# Patient Record
Sex: Female | Born: 1945 | Race: White | Hispanic: No | Marital: Married | Smoking: Former smoker
Health system: Southern US, Community
[De-identification: ages and names within clinical notes are randomized; demographics above are authoritative.]

## PROBLEM LIST (undated history)

## (undated) DIAGNOSIS — E119 Type 2 diabetes mellitus without complications: Secondary | ICD-10-CM

## (undated) DIAGNOSIS — G4730 Sleep apnea, unspecified: Secondary | ICD-10-CM

## (undated) DIAGNOSIS — R609 Edema, unspecified: Secondary | ICD-10-CM

## (undated) DIAGNOSIS — C4491 Basal cell carcinoma of skin, unspecified: Secondary | ICD-10-CM

## (undated) DIAGNOSIS — T884XXA Failed or difficult intubation, initial encounter: Secondary | ICD-10-CM

## (undated) DIAGNOSIS — F848 Other pervasive developmental disorders: Secondary | ICD-10-CM

## (undated) DIAGNOSIS — K635 Polyp of colon: Secondary | ICD-10-CM

## (undated) DIAGNOSIS — J449 Chronic obstructive pulmonary disease, unspecified: Secondary | ICD-10-CM

## (undated) DIAGNOSIS — T8859XA Other complications of anesthesia, initial encounter: Secondary | ICD-10-CM

## (undated) DIAGNOSIS — R32 Unspecified urinary incontinence: Secondary | ICD-10-CM

## (undated) DIAGNOSIS — R7303 Prediabetes: Secondary | ICD-10-CM

## (undated) DIAGNOSIS — I10 Essential (primary) hypertension: Secondary | ICD-10-CM

## (undated) DIAGNOSIS — E669 Obesity, unspecified: Secondary | ICD-10-CM

## (undated) DIAGNOSIS — T4145XA Adverse effect of unspecified anesthetic, initial encounter: Secondary | ICD-10-CM

## (undated) DIAGNOSIS — M25642 Stiffness of left hand, not elsewhere classified: Secondary | ICD-10-CM

## (undated) DIAGNOSIS — M1990 Unspecified osteoarthritis, unspecified site: Secondary | ICD-10-CM

## (undated) DIAGNOSIS — E785 Hyperlipidemia, unspecified: Secondary | ICD-10-CM

## (undated) DIAGNOSIS — M1612 Unilateral primary osteoarthritis, left hip: Secondary | ICD-10-CM

## (undated) DIAGNOSIS — J45909 Unspecified asthma, uncomplicated: Secondary | ICD-10-CM

## (undated) HISTORY — PX: BACK SURGERY: SHX140

## (undated) HISTORY — PX: COLONOSCOPY: SHX174

## (undated) HISTORY — PX: EYE SURGERY: SHX253

## (undated) HISTORY — DX: Basal cell carcinoma of skin, unspecified: C44.91

## (undated) HISTORY — PX: ABDOMINAL HYSTERECTOMY: SHX81

## (undated) HISTORY — PX: FRACTURE SURGERY: SHX138

---

## 2001-08-12 HISTORY — PX: FRACTURE SURGERY: SHX138

## 2003-04-13 HISTORY — PX: ELBOW SURGERY: SHX618

## 2003-08-13 HISTORY — PX: THUMB AMPUTATION: SHX804

## 2004-07-03 ENCOUNTER — Ambulatory Visit: Payer: Self-pay | Admitting: Gastroenterology

## 2004-08-12 HISTORY — PX: HAND SURGERY: SHX662

## 2005-01-04 ENCOUNTER — Ambulatory Visit: Payer: Self-pay

## 2005-08-12 DIAGNOSIS — F848 Other pervasive developmental disorders: Secondary | ICD-10-CM

## 2005-08-12 HISTORY — DX: Other pervasive developmental disorders: F84.8

## 2006-01-31 ENCOUNTER — Ambulatory Visit: Payer: Self-pay | Admitting: Internal Medicine

## 2007-02-03 ENCOUNTER — Ambulatory Visit: Payer: Self-pay

## 2007-04-14 ENCOUNTER — Ambulatory Visit: Payer: Self-pay | Admitting: Gastroenterology

## 2007-08-13 HISTORY — DX: Hypercalcemia: E83.52

## 2008-02-05 ENCOUNTER — Ambulatory Visit: Payer: Self-pay

## 2009-02-06 ENCOUNTER — Ambulatory Visit: Payer: Self-pay | Admitting: Internal Medicine

## 2010-02-07 ENCOUNTER — Ambulatory Visit: Payer: Self-pay | Admitting: Internal Medicine

## 2011-02-15 ENCOUNTER — Ambulatory Visit: Payer: Self-pay

## 2012-02-17 ENCOUNTER — Ambulatory Visit: Payer: Self-pay

## 2013-04-09 ENCOUNTER — Ambulatory Visit: Payer: Self-pay | Admitting: Internal Medicine

## 2013-10-15 DIAGNOSIS — E559 Vitamin D deficiency, unspecified: Secondary | ICD-10-CM | POA: Insufficient documentation

## 2013-10-15 HISTORY — DX: Vitamin D deficiency, unspecified: E55.9

## 2014-04-13 DIAGNOSIS — G629 Polyneuropathy, unspecified: Secondary | ICD-10-CM

## 2014-04-13 HISTORY — DX: Polyneuropathy, unspecified: G62.9

## 2014-05-12 ENCOUNTER — Ambulatory Visit: Payer: Self-pay | Admitting: Internal Medicine

## 2015-01-04 ENCOUNTER — Other Ambulatory Visit: Payer: Self-pay

## 2015-01-04 ENCOUNTER — Emergency Department
Admission: EM | Admit: 2015-01-04 | Discharge: 2015-01-04 | Disposition: A | Discharge status: Home / Self Care | Payer: Medicare PPO | Attending: Emergency Medicine | Admitting: Emergency Medicine

## 2015-01-04 ENCOUNTER — Emergency Department: Payer: Medicare PPO

## 2015-01-04 DIAGNOSIS — Z79899 Other long term (current) drug therapy: Secondary | ICD-10-CM | POA: Insufficient documentation

## 2015-01-04 DIAGNOSIS — M549 Dorsalgia, unspecified: Secondary | ICD-10-CM | POA: Diagnosis not present

## 2015-01-04 DIAGNOSIS — G629 Polyneuropathy, unspecified: Secondary | ICD-10-CM | POA: Insufficient documentation

## 2015-01-04 DIAGNOSIS — M792 Neuralgia and neuritis, unspecified: Secondary | ICD-10-CM

## 2015-01-04 DIAGNOSIS — M25511 Pain in right shoulder: Secondary | ICD-10-CM | POA: Diagnosis present

## 2015-01-04 LAB — COMPREHENSIVE METABOLIC PANEL
ALT: 27 U/L (ref 14–54)
AST: 23 U/L (ref 15–41)
Albumin: 3.9 g/dL (ref 3.5–5.0)
Alkaline Phosphatase: 64 U/L (ref 38–126)
Anion gap: 7 (ref 5–15)
BILIRUBIN TOTAL: 0.5 mg/dL (ref 0.3–1.2)
BUN: 22 mg/dL — ABNORMAL HIGH (ref 6–20)
CHLORIDE: 106 mmol/L (ref 101–111)
CO2: 27 mmol/L (ref 22–32)
CREATININE: 0.77 mg/dL (ref 0.44–1.00)
Calcium: 9.8 mg/dL (ref 8.9–10.3)
GFR calc Af Amer: >60 mL/min (ref >60)
GFR calc non Af Amer: >60 mL/min (ref >60)
GLUCOSE: 152 mg/dL — AB (ref 65–99)
POTASSIUM: 4.3 mmol/L (ref 3.5–5.1)
SODIUM: 140 mmol/L (ref 135–145)
Total Protein: 7 g/dL (ref 6.5–8.1)

## 2015-01-04 LAB — CBC WITH DIFFERENTIAL/PLATELET
BASOS ABS: 0.1 10*3/uL (ref 0–0.1)
Basophils Relative: 1 %
Eosinophils Absolute: 0.3 10*3/uL (ref 0–0.7)
Eosinophils Relative: 3 %
HCT: 42.3 % (ref 35.0–47.0)
Hemoglobin: 14.1 g/dL (ref 12.0–16.0)
Lymphocytes Relative: 41 %
Lymphs Abs: 3.8 10*3/uL — ABNORMAL HIGH (ref 1.0–3.6)
MCH: 28.8 pg (ref 26.0–34.0)
MCHC: 33.3 g/dL (ref 32.0–36.0)
MCV: 86.6 fL (ref 80.0–100.0)
MONOS PCT: 5 %
Monocytes Absolute: 0.4 10*3/uL (ref 0.2–0.9)
Neutro Abs: 4.7 10*3/uL (ref 1.4–6.5)
Neutrophils Relative %: 50 %
Platelets: 250 10*3/uL (ref 150–440)
RBC: 4.88 MIL/uL (ref 3.80–5.20)
RDW: 14.3 % (ref 11.5–14.5)
WBC: 9.4 10*3/uL (ref 3.6–11.0)

## 2015-01-04 LAB — TROPONIN I: Troponin I: <0.03 ng/mL (ref <0.031)

## 2015-01-04 MED ORDER — MEPERIDINE HCL 25 MG/ML IJ SOLN
25.0000 mg | Freq: Once | INTRAMUSCULAR | Status: AC
  Administered 2015-01-04: 25 mg via INTRAMUSCULAR

## 2015-01-04 MED ORDER — PROMETHAZINE HCL 25 MG/ML IJ SOLN
25.0000 mg | Freq: Four times a day (QID) | INTRAMUSCULAR | Status: DC | PRN
  Administered 2015-01-04: 25 mg via INTRAMUSCULAR

## 2015-01-04 MED ORDER — GABAPENTIN 100 MG PO CAPS: 100.0000 mg | ORAL_CAPSULE | Freq: Three times a day (TID) | ORAL | Status: DC

## 2015-01-04 MED ORDER — OXYCODONE-ACETAMINOPHEN 5-325 MG PO TABS: 1.0000 | ORAL_TABLET | ORAL | Status: DC | PRN

## 2015-01-04 MED ORDER — CYCLOBENZAPRINE HCL 10 MG PO TABS: 10.0000 mg | ORAL_TABLET | Freq: Three times a day (TID) | ORAL | Status: DC | PRN

## 2015-01-04 MED ORDER — DIAZEPAM 5 MG PO TABS
ORAL_TABLET | ORAL | Status: AC
  Filled 2015-01-04: qty 1

## 2015-01-04 MED ORDER — DIAZEPAM 5 MG PO TABS
5.0000 mg | ORAL_TABLET | Freq: Once | ORAL | Status: AC
  Administered 2015-01-04: 5 mg via ORAL

## 2015-01-04 MED ORDER — MEPERIDINE HCL 25 MG/ML IJ SOLN
INTRAMUSCULAR | Status: AC
  Filled 2015-01-04: qty 1

## 2015-01-04 MED ORDER — PROMETHAZINE HCL 25 MG/ML IJ SOLN
INTRAMUSCULAR | Status: AC
  Administered 2015-01-04: 25 mg via INTRAMUSCULAR
  Filled 2015-01-04: qty 1

## 2015-01-04 NOTE — ED Notes (Signed)
Presents with right shoulder pain that radiates down into right hand with some numbness to right hand. No known injury. Started this am.

## 2015-01-04 NOTE — ED Provider Notes (Signed)
[facility] Emergency Department Provider Note  ____________________________________________  Time seen: Approximately 9:04 AM  I have reviewed the triage vital signs and the nursing notes.   HISTORY  Chief Complaint Shoulder Pain   HPI Laura Hale is a 69 y.o. female presents for evaluation of severe right shoulder and neck pain. States the pain started this morning sudden onset set upon wakening. Patient thought she was having a heart attack. Brought in by EMS EKG normal and troponin levels are normal. Patient was sent back to flex for evaluation. Not exacerbated or made better by anything tried by the patient.  No past medical history on file.  There are no active problems to display for this patient.   No past surgical history on file.  Current Outpatient Rx  Name  Route  Sig  Dispense  Refill  . cyclobenzaprine (FLEXERIL) 10 MG tablet   Oral   Take 1 tablet (10 mg total) by mouth every 8 (eight) hours as needed for muscle spasms.   30 tablet   1   . gabapentin (NEURONTIN) 100 MG capsule   Oral   Take 1 capsule (100 mg total) by mouth 3 (three) times daily.   30 capsule   0   . oxyCODONE-acetaminophen (ROXICET) 5-325 MG per tablet   Oral   Take 1-2 tablets by mouth every 4 (four) hours as needed for severe pain.   15 tablet   0     Allergies Review of patient's allergies indicates not on file.  No family history on file.  Social History History  Substance Use Topics  . Smoking status: Not on file  . Smokeless tobacco: Not on file  . Alcohol Use: Not on file    Review of Systems Constitutional: No fever/chills Eyes: No visual changes. ENT: No sore throat. Cardiovascular: Denies chest pain. Respiratory: Denies shortness of breath. Gastrointestinal: No abdominal pain.  No nausea, no vomiting.  No diarrhea.  No constipation. Genitourinary: Negative for dysuria. Musculoskeletal: Negative for back pain. Skin: Negative  for rash. Neurological: Negative for headaches, focal weakness or numbness.  10-point ROS otherwise negative.  ____________________________________________   PHYSICAL EXAM:  VITAL SIGNS: ED Triage Vitals  Enc Vitals Group     BP 01/04/15 0722 115/92 mmHg     Pulse Rate 01/04/15 0722 65     Resp 01/04/15 0722 20     Temp 01/04/15 0722 97.7 F (36.5 C)     Temp Source 01/04/15 0722 Oral     SpO2 01/04/15 0722 99 %     Weight 01/04/15 0722 232 lb (105.235 kg)     Height 01/04/15 0722 5\' 6"  (1.676 m)     Head Cir --      Peak Flow --      Pain Score 01/04/15 0718 7     Pain Loc --      Pain Edu? --      Excl. in [place]? --     Constitutional: Alert and oriented. Well appearing and in no acute distress. Eyes: Conjunctivae are normal. PERRL. EOMI. Head: Atraumatic. Nose: No congestion/rhinnorhea. Mouth/Throat: Mucous membranes are moist.  Oropharynx non-erythematous. Neck: No stridor. Mild cervical spine tenderness. Hematological/Lymphatic/Immunilogical: No cervical lymphadenopathy. Cardiovascular: Normal rate, regular rhythm. Grossly normal heart sounds.  Good peripheral circulation. Respiratory: Normal respiratory effort.  No retractions. Lungs CTAB. Gastrointestinal: Soft and nontender. No distention. No abdominal bruits. No CVA tenderness. Musculoskeletal: No lower extremity tenderness nor edema.  No joint effusions. Positive cervical right  scapular tenderness. Neurologic:  Normal speech and language. No gross focal neurologic deficits are appreciated. Speech is normal. No gait instability. Skin:  Skin is warm, dry and intact. No rash noted. Psychiatric: Mood and affect are normal. Speech and behavior are normal.  ____________________________________________   LABS (all labs ordered are listed, but only abnormal results are displayed)  Labs Reviewed  CBC WITH DIFFERENTIAL/PLATELET - Abnormal; Notable for the following:    Lymphs Abs 3.8 (*)    All other components  within normal limits  COMPREHENSIVE METABOLIC PANEL - Abnormal; Notable for the following:    Glucose, Bld 152 (*)    BUN 22 (*)    All other components within normal limits  TROPONIN I   ____________________________________________  EKG  EKG normal sinus rhythm with left anterior block. Reviewed by ER M.D. ____________________________________________  RADIOLOGY  IMPRESSION: 1. Loss of the normal cervical lordosis may be related to underlying muscle spasm. 2. Multilevel degenerative disc disease most notable at C5-C6. 3. Right-sided facet arthropathy at C2-C3 and C4-C5. 4. Mild anterolisthesis of C4 on C5 favored to be degenerative in etiology. 5. No evidence of fracture, malalignment or bony lesion. 6. Atherosclerotic vascular calcifications including calcifications in both carotid bifurcations. ____________________________________________   PROCEDURES  Procedure(s) performed: None  Critical Care performed: No  ____________________________________________   INITIAL IMPRESSION / ASSESSMENT AND PLAN / ED COURSE  Pertinent labs & imaging results that were available during my care of the patient were reviewed by me and considered in my medical decision making (see chart for details).  Discussed all clinical radiological findings with the patient. Patient verbalizes understanding we'll treat for neuropathic pain rule out possible shingles breakout next 24-48 hours. Patient understands to return to the ER for worsening symptomatology. Rx given for gabapentin, oxycodone, and cyclobenzaprine. ____________________________________________   FINAL CLINICAL IMPRESSION(S) / ED DIAGNOSES  Final diagnoses:  Neuropathic pain of shoulder, right      Arlyss Repress, PA-C 01/04/15 1115  Lisa Roca, MD 01/04/15 1525

## 2015-01-04 NOTE — ED Notes (Signed)
Pt brought in by EMS with c/o Right shoulder pain. Her arm is tingling. EMS reports that her VSS and her EKG was WNL. Pt denies any injury to arm. No Deformity seen at this time.

## 2015-01-04 NOTE — ED Notes (Signed)
Pt reports pain some relieved by meds. PA aware.

## 2015-01-04 NOTE — Discharge Instructions (Signed)

## 2015-01-04 NOTE — ED Notes (Signed)
CT completed

## 2015-01-10 ENCOUNTER — Other Ambulatory Visit: Payer: Self-pay | Admitting: Internal Medicine

## 2015-01-10 DIAGNOSIS — M5090 Cervical disc disorder, unspecified, unspecified cervical region: Secondary | ICD-10-CM

## 2015-01-18 ENCOUNTER — Ambulatory Visit
Admission: RE | Admit: 2015-01-18 | Discharge: 2015-01-18 | Disposition: A | Discharge status: Home / Self Care | Payer: Medicare PPO | Source: Ambulatory Visit | Attending: Internal Medicine | Admitting: Internal Medicine

## 2015-01-18 DIAGNOSIS — M542 Cervicalgia: Secondary | ICD-10-CM | POA: Diagnosis present

## 2015-01-18 DIAGNOSIS — M5090 Cervical disc disorder, unspecified, unspecified cervical region: Secondary | ICD-10-CM

## 2015-01-18 DIAGNOSIS — M47892 Other spondylosis, cervical region: Secondary | ICD-10-CM | POA: Insufficient documentation

## 2015-01-18 DIAGNOSIS — M5022 Other cervical disc displacement, mid-cervical region: Secondary | ICD-10-CM | POA: Insufficient documentation

## 2015-01-23 ENCOUNTER — Other Ambulatory Visit: Payer: Self-pay | Admitting: Neurosurgery

## 2015-01-27 ENCOUNTER — Encounter (HOSPITAL_COMMUNITY)
Admission: RE | Admit: 2015-01-27 | Discharge: 2015-01-27 | Disposition: A | Discharge status: Home / Self Care | Payer: Medicare PPO | Source: Ambulatory Visit | Attending: Neurosurgery | Admitting: Neurosurgery

## 2015-01-27 ENCOUNTER — Encounter (HOSPITAL_COMMUNITY): Payer: Self-pay

## 2015-01-27 DIAGNOSIS — Z01812 Encounter for preprocedural laboratory examination: Secondary | ICD-10-CM | POA: Diagnosis not present

## 2015-01-27 DIAGNOSIS — Z01818 Encounter for other preprocedural examination: Secondary | ICD-10-CM | POA: Diagnosis not present

## 2015-01-27 HISTORY — DX: Unspecified osteoarthritis, unspecified site: M19.90

## 2015-01-27 HISTORY — DX: Unspecified asthma, uncomplicated: J45.909

## 2015-01-27 LAB — CBC
HCT: 43.9 % (ref 36.0–46.0)
Hemoglobin: 14.4 g/dL (ref 12.0–15.0)
MCH: 28.7 pg (ref 26.0–34.0)
MCHC: 32.8 g/dL (ref 30.0–36.0)
MCV: 87.5 fL (ref 78.0–100.0)
PLATELETS: 244 10*3/uL (ref 150–400)
RBC: 5.02 MIL/uL (ref 3.87–5.11)
RDW: 13.7 % (ref 11.5–15.5)
WBC: 11 10*3/uL — ABNORMAL HIGH (ref 4.0–10.5)

## 2015-01-27 LAB — BASIC METABOLIC PANEL
ANION GAP: 7 (ref 5–15)
BUN: 13 mg/dL (ref 6–20)
CHLORIDE: 107 mmol/L (ref 101–111)
CO2: 27 mmol/L (ref 22–32)
CREATININE: 0.76 mg/dL (ref 0.44–1.00)
Calcium: 10.6 mg/dL — ABNORMAL HIGH (ref 8.9–10.3)
GFR calc Af Amer: >60 mL/min (ref >60)
GFR calc non Af Amer: >60 mL/min (ref >60)
GLUCOSE: 106 mg/dL — AB (ref 65–99)
Potassium: 4.4 mmol/L (ref 3.5–5.1)
Sodium: 141 mmol/L (ref 135–145)

## 2015-01-27 LAB — SURGICAL PCR SCREEN
MRSA, PCR: NEGATIVE
Staphylococcus aureus: NEGATIVE

## 2015-01-27 NOTE — Progress Notes (Signed)
   01/27/15 1431  OBSTRUCTIVE SLEEP APNEA  Have you ever been diagnosed with sleep apnea through a sleep study? No  Do you snore loudly (loud enough to be heard through closed doors)?  1  Do you often feel tired, fatigued, or sleepy during the daytime? 0  Has anyone observed you stop breathing during your sleep? 0  Do you have, or are you being treated for high blood pressure? 0  BMI more than 35 kg/m2? 1  Age over 67 years old? 1  Neck circumference greater than 40 cm/16 inches? 1  Gender: 0

## 2015-01-27 NOTE — Pre-Procedure Instructions (Signed)
Laura Hale  01/27/2015      [id number] [place], [name] [address] Phone: [phone] Fax: [phone]    Your procedure is scheduled on 01/31/2015   Report to [facility] Admitting at 5:30 A.M.   Call this number if you have problems the morning of surgery:   [phone]   Remember:  Do not eat food or drink liquids after midnight.   Take these medicines the morning of surgery with A SIP OF WATER GABAPENTIN, nasal spray ok if desired,  Zyrtec if you desire     Do not wear jewelry, make-up or nail polish.   Do not wear lotions, powders, or perfumes.     Do not shave 48 hours prior to surgery.    Do not bring valuables to the hospital.   [facility] is not responsible for any belongings or valuables.  Contacts, dentures or bridgework may not be worn into surgery.  Leave your suitcase in the car.  After surgery it may be brought to your room.  For patients admitted to the hospital, discharge time will be determined by your treatment team.  Patients discharged the day of surgery will not be allowed to drive home.   Name and phone number of your driver:  /w [name] Special instructions:  Special Instructions: [place] - Preparing for Surgery  Before surgery, you can play an important role.  Because skin is not sterile, your skin needs to be as free of germs as possible.  You can reduce the number of germs on you skin by washing with CHG (chlorahexidine gluconate) soap before surgery.  CHG is an antiseptic cleaner which kills germs and bonds with the skin to continue killing germs even after washing.  Please DO NOT use if you have an allergy to CHG or antibacterial soaps.  If your skin becomes reddened/irritated stop using the CHG and inform your nurse when you arrive at Short Stay.  Do not shave (including legs and underarms) for at least 48 hours prior to the first CHG shower.  You may shave your  face.  Please follow these instructions carefully:   1.  Shower with CHG Soap the night before surgery and the  morning of Surgery.  2.  If you choose to wash your hair, wash your hair first as usual with your  normal shampoo.  3.  After you shampoo, rinse your hair and body thoroughly to remove the  Shampoo.  4.  Use CHG as you would any other liquid soap.  You can apply chg directly to the skin and wash gently with scrungie or a clean washcloth.  5.  Apply the CHG Soap to your body ONLY FROM THE NECK DOWN.    Do not use on open wounds or open sores.  Avoid contact with your eyes, ears, mouth and genitals (private parts).  Wash genitals (private parts)   with your normal soap.  6.  Wash thoroughly, paying special attention to the area where your surgery will be performed.  7.  Thoroughly rinse your body with warm water from the neck down.  8.  DO NOT shower/wash with your normal soap after using and rinsing off   the CHG Soap.  9.  Pat yourself dry with a clean towel.            10.  Wear clean pajamas.  11.  Place clean sheets on your bed the night of your first shower and do not sleep with pets.  Day of Surgery  Do not apply any lotions/deodorants the morning of surgery.  Please wear clean clothes to the hospital/surgery center.  Please read over the following fact sheets that you were given. Pain Booklet, deep breathing exercises, infection information, treatment with MUPIROCIN if needed

## 2015-01-27 NOTE — Progress Notes (Signed)
Primary for Ms. Okey Dupre is with Dr. Caryn Section, she denies ever having any cardiac advanced testing. Seen in ED at [facility] late May 2016, but found to have degenerative cervical spine so was d/c'd with meds & further follow up.

## 2015-01-30 MED ORDER — CEFAZOLIN SODIUM-DEXTROSE 2-3 GM-% IV SOLR
2.0000 g | INTRAVENOUS | Status: AC
  Administered 2015-01-31: 2 g via INTRAVENOUS
  Filled 2015-01-30: qty 50

## 2015-01-30 MED ORDER — DEXAMETHASONE SODIUM PHOSPHATE 10 MG/ML IJ SOLN
10.0000 mg | INTRAMUSCULAR | Status: AC
  Administered 2015-01-31: 10 mg via INTRAVENOUS
  Filled 2015-01-30: qty 1

## 2015-01-31 ENCOUNTER — Inpatient Hospital Stay (HOSPITAL_COMMUNITY): Payer: Medicare PPO | Admitting: Anesthesiology

## 2015-01-31 ENCOUNTER — Encounter (HOSPITAL_COMMUNITY)
Admission: RE | Disposition: A | Discharge status: Home / Self Care | Payer: Self-pay | Source: Ambulatory Visit | Attending: Neurosurgery

## 2015-01-31 ENCOUNTER — Inpatient Hospital Stay (HOSPITAL_COMMUNITY)
Admission: RE | Admit: 2015-01-31 | Discharge: 2015-02-01 | DRG: 473 | Disposition: A | Discharge status: Home / Self Care | Payer: Medicare PPO | Source: Ambulatory Visit | Attending: Neurosurgery | Admitting: Neurosurgery

## 2015-01-31 ENCOUNTER — Inpatient Hospital Stay (HOSPITAL_COMMUNITY): Payer: Medicare PPO

## 2015-01-31 DIAGNOSIS — M5012 Cervical disc disorder with radiculopathy, mid-cervical region: Secondary | ICD-10-CM | POA: Diagnosis present

## 2015-01-31 DIAGNOSIS — M79601 Pain in right arm: Secondary | ICD-10-CM | POA: Diagnosis present

## 2015-01-31 DIAGNOSIS — N393 Stress incontinence (female) (male): Secondary | ICD-10-CM | POA: Diagnosis present

## 2015-01-31 DIAGNOSIS — Z7982 Long term (current) use of aspirin: Secondary | ICD-10-CM

## 2015-01-31 DIAGNOSIS — Z87891 Personal history of nicotine dependence: Secondary | ICD-10-CM | POA: Diagnosis not present

## 2015-01-31 DIAGNOSIS — Z419 Encounter for procedure for purposes other than remedying health state, unspecified: Secondary | ICD-10-CM

## 2015-01-31 DIAGNOSIS — Z79899 Other long term (current) drug therapy: Secondary | ICD-10-CM | POA: Diagnosis not present

## 2015-01-31 DIAGNOSIS — M5020 Other cervical disc displacement, unspecified cervical region: Secondary | ICD-10-CM | POA: Diagnosis present

## 2015-01-31 DIAGNOSIS — J45909 Unspecified asthma, uncomplicated: Secondary | ICD-10-CM | POA: Diagnosis present

## 2015-01-31 HISTORY — DX: Other cervical disc displacement, unspecified cervical region: M50.20

## 2015-01-31 HISTORY — PX: ANTERIOR CERVICAL DECOMP/DISCECTOMY FUSION: SHX1161

## 2015-01-31 SURGERY — ANTERIOR CERVICAL DECOMPRESSION/DISCECTOMY FUSION 1 LEVEL
Anesthesia: General

## 2015-01-31 MED ORDER — ZOLPIDEM TARTRATE 5 MG PO TABS: 5.0000 mg | ORAL_TABLET | Freq: Every evening | ORAL | Status: DC | PRN

## 2015-01-31 MED ORDER — CEFAZOLIN SODIUM-DEXTROSE 2-3 GM-% IV SOLR
2.0000 g | Freq: Three times a day (TID) | INTRAVENOUS | Status: AC
  Administered 2015-01-31 (×2): 2 g via INTRAVENOUS
  Filled 2015-01-31 (×2): qty 50

## 2015-01-31 MED ORDER — PHENYLEPHRINE 40 MCG/ML (10ML) SYRINGE FOR IV PUSH (FOR BLOOD PRESSURE SUPPORT)
PREFILLED_SYRINGE | INTRAVENOUS | Status: AC
  Filled 2015-01-31: qty 10

## 2015-01-31 MED ORDER — AZELASTINE HCL 0.1 % NA SOLN
1.0000 | Freq: Every day | NASAL | Status: DC | PRN
  Filled 2015-01-31: qty 30

## 2015-01-31 MED ORDER — HEMOSTATIC AGENTS (NO CHARGE) OPTIME
TOPICAL | Status: DC | PRN
  Administered 2015-01-31: 1 via TOPICAL

## 2015-01-31 MED ORDER — DEXAMETHASONE 4 MG PO TABS
4.0000 mg | ORAL_TABLET | Freq: Four times a day (QID) | ORAL | Status: AC
  Administered 2015-01-31: 4 mg via ORAL
  Filled 2015-01-31: qty 1

## 2015-01-31 MED ORDER — LACTATED RINGERS IV SOLN
INTRAVENOUS | Status: DC | PRN
  Administered 2015-01-31 (×2): via INTRAVENOUS

## 2015-01-31 MED ORDER — ROCURONIUM BROMIDE 50 MG/5ML IV SOLN
INTRAVENOUS | Status: AC
  Filled 2015-01-31: qty 2

## 2015-01-31 MED ORDER — ONDANSETRON HCL 4 MG/2ML IJ SOLN: 4.0000 mg | INTRAMUSCULAR | Status: DC | PRN

## 2015-01-31 MED ORDER — PHENOL 1.4 % MT LIQD
1.0000 | OROMUCOSAL | Status: DC | PRN
  Filled 2015-01-31: qty 177

## 2015-01-31 MED ORDER — PANTOPRAZOLE SODIUM 40 MG IV SOLR
40.0000 mg | Freq: Every day | INTRAVENOUS | Status: DC
  Filled 2015-01-31: qty 40

## 2015-01-31 MED ORDER — ACETAMINOPHEN 650 MG RE SUPP: 650.0000 mg | RECTAL | Status: DC | PRN

## 2015-01-31 MED ORDER — ONDANSETRON HCL 4 MG/2ML IJ SOLN
INTRAMUSCULAR | Status: DC | PRN
  Administered 2015-01-31: 4 mg via INTRAVENOUS

## 2015-01-31 MED ORDER — ACETAMINOPHEN 325 MG PO TABS: 650.0000 mg | ORAL_TABLET | ORAL | Status: DC | PRN

## 2015-01-31 MED ORDER — 0.9 % SODIUM CHLORIDE (POUR BTL) OPTIME
TOPICAL | Status: DC | PRN
  Administered 2015-01-31: 1000 mL

## 2015-01-31 MED ORDER — CYCLOBENZAPRINE HCL 10 MG PO TABS: 10.0000 mg | ORAL_TABLET | Freq: Three times a day (TID) | ORAL | Status: DC | PRN

## 2015-01-31 MED ORDER — SODIUM CHLORIDE 0.9 % IJ SOLN
3.0000 mL | Freq: Two times a day (BID) | INTRAMUSCULAR | Status: DC
Start: 2015-01-31 — End: 2015-02-01
  Administered 2015-01-31 (×2): 3 mL via INTRAVENOUS

## 2015-01-31 MED ORDER — KCL IN DEXTROSE-NACL 20-5-0.45 MEQ/L-%-% IV SOLN
80.0000 mL/h | INTRAVENOUS | Status: DC
  Filled 2015-01-31 (×3): qty 1000

## 2015-01-31 MED ORDER — HYDROMORPHONE HCL 1 MG/ML IJ SOLN
0.2500 mg | INTRAMUSCULAR | Status: DC | PRN
  Administered 2015-01-31 (×2): 0.25 mg via INTRAVENOUS

## 2015-01-31 MED ORDER — GLYCOPYRROLATE 0.2 MG/ML IJ SOLN
INTRAMUSCULAR | Status: AC
  Filled 2015-01-31: qty 2

## 2015-01-31 MED ORDER — GLYCOPYRROLATE 0.2 MG/ML IJ SOLN
INTRAMUSCULAR | Status: DC | PRN
  Administered 2015-01-31: .4 mg via INTRAVENOUS

## 2015-01-31 MED ORDER — BISACODYL 5 MG PO TBEC
5.0000 mg | DELAYED_RELEASE_TABLET | Freq: Every day | ORAL | Status: DC | PRN
  Filled 2015-01-31: qty 1

## 2015-01-31 MED ORDER — PANTOPRAZOLE SODIUM 40 MG PO TBEC
40.0000 mg | DELAYED_RELEASE_TABLET | Freq: Every day | ORAL | Status: DC
  Administered 2015-01-31: 40 mg via ORAL

## 2015-01-31 MED ORDER — PROPOFOL 10 MG/ML IV BOLUS
INTRAVENOUS | Status: DC | PRN
  Administered 2015-01-31: 170 mg via INTRAVENOUS

## 2015-01-31 MED ORDER — DEXAMETHASONE SODIUM PHOSPHATE 4 MG/ML IJ SOLN
4.0000 mg | Freq: Four times a day (QID) | INTRAMUSCULAR | Status: AC
  Administered 2015-01-31: 4 mg via INTRAVENOUS
  Filled 2015-01-31: qty 1

## 2015-01-31 MED ORDER — LIDOCAINE HCL (CARDIAC) 20 MG/ML IV SOLN
INTRAVENOUS | Status: DC | PRN
  Administered 2015-01-31: 60 mg via INTRAVENOUS

## 2015-01-31 MED ORDER — MENTHOL 3 MG MT LOZG
1.0000 | LOZENGE | OROMUCOSAL | Status: DC | PRN
Start: 2015-01-31 — End: 2015-02-01
  Filled 2015-01-31: qty 9

## 2015-01-31 MED ORDER — LIDOCAINE HCL (CARDIAC) 20 MG/ML IV SOLN
INTRAVENOUS | Status: AC
  Filled 2015-01-31: qty 5

## 2015-01-31 MED ORDER — OXYCODONE-ACETAMINOPHEN 5-325 MG PO TABS
1.0000 | ORAL_TABLET | ORAL | Status: DC | PRN
  Administered 2015-01-31: 2 via ORAL
  Filled 2015-01-31: qty 2

## 2015-01-31 MED ORDER — PROPOFOL 10 MG/ML IV BOLUS
INTRAVENOUS | Status: AC
  Filled 2015-01-31: qty 20

## 2015-01-31 MED ORDER — FENTANYL CITRATE (PF) 250 MCG/5ML IJ SOLN
INTRAMUSCULAR | Status: AC
  Filled 2015-01-31: qty 5

## 2015-01-31 MED ORDER — MIDAZOLAM HCL 5 MG/5ML IJ SOLN
INTRAMUSCULAR | Status: DC | PRN
  Administered 2015-01-31: 2 mg via INTRAVENOUS

## 2015-01-31 MED ORDER — ROCURONIUM BROMIDE 100 MG/10ML IV SOLN
INTRAVENOUS | Status: DC | PRN
  Administered 2015-01-31: 50 mg via INTRAVENOUS

## 2015-01-31 MED ORDER — DOCUSATE SODIUM 100 MG PO CAPS
100.0000 mg | ORAL_CAPSULE | Freq: Two times a day (BID) | ORAL | Status: DC
  Administered 2015-01-31: 100 mg via ORAL
  Filled 2015-01-31: qty 1

## 2015-01-31 MED ORDER — HYDROMORPHONE HCL 1 MG/ML IJ SOLN
INTRAMUSCULAR | Status: AC
  Filled 2015-01-31: qty 1

## 2015-01-31 MED ORDER — NEOSTIGMINE METHYLSULFATE 10 MG/10ML IV SOLN
INTRAVENOUS | Status: DC | PRN
  Administered 2015-01-31: 3 mg via INTRAVENOUS

## 2015-01-31 MED ORDER — MIDAZOLAM HCL 2 MG/2ML IJ SOLN
INTRAMUSCULAR | Status: AC
  Filled 2015-01-31: qty 2

## 2015-01-31 MED ORDER — ONDANSETRON HCL 4 MG/2ML IJ SOLN
INTRAMUSCULAR | Status: AC
  Filled 2015-01-31: qty 2

## 2015-01-31 MED ORDER — SODIUM CHLORIDE 0.9 % IR SOLN
Status: DC | PRN
  Administered 2015-01-31: 08:00:00

## 2015-01-31 MED ORDER — NEOSTIGMINE METHYLSULFATE 10 MG/10ML IV SOLN
INTRAVENOUS | Status: AC
  Filled 2015-01-31: qty 2

## 2015-01-31 MED ORDER — PROMETHAZINE HCL 25 MG/ML IJ SOLN
6.2500 mg | INTRAMUSCULAR | Status: DC | PRN
Start: 2015-01-31 — End: 2015-01-31

## 2015-01-31 MED ORDER — FENTANYL CITRATE (PF) 250 MCG/5ML IJ SOLN
INTRAMUSCULAR | Status: DC | PRN
Start: 2015-01-31 — End: 2015-01-31
  Administered 2015-01-31: 150 ug via INTRAVENOUS
  Administered 2015-01-31: 100 ug via INTRAVENOUS

## 2015-01-31 MED ORDER — GABAPENTIN 100 MG PO CAPS
100.0000 mg | ORAL_CAPSULE | Freq: Three times a day (TID) | ORAL | Status: DC
  Administered 2015-01-31 (×2): 100 mg via ORAL
  Filled 2015-01-31 (×6): qty 1

## 2015-01-31 MED ORDER — ARTIFICIAL TEARS OP OINT
TOPICAL_OINTMENT | OPHTHALMIC | Status: AC
  Filled 2015-01-31: qty 3.5

## 2015-01-31 MED ORDER — BUPIVACAINE HCL (PF) 0.5 % IJ SOLN
INTRAMUSCULAR | Status: DC | PRN
  Administered 2015-01-31: 10 mL

## 2015-01-31 MED ORDER — SODIUM CHLORIDE 0.9 % IJ SOLN: 3.0000 mL | INTRAMUSCULAR | Status: DC | PRN

## 2015-01-31 MED ORDER — HYDROMORPHONE HCL 1 MG/ML IJ SOLN: 1.0000 mg | INTRAMUSCULAR | Status: DC | PRN

## 2015-01-31 MED ORDER — THROMBIN 5000 UNITS EX SOLR
CUTANEOUS | Status: DC | PRN
  Administered 2015-01-31 (×2): 5000 [IU] via TOPICAL

## 2015-01-31 SURGICAL SUPPLY — 58 items
BAG DECANTER FOR FLEXI CONT (MISCELLANEOUS) ×2 IMPLANT
BENZOIN TINCTURE PRP APPL 2/3 (GAUZE/BANDAGES/DRESSINGS) ×2 IMPLANT
BIT DRILL TRINICA 2.3MM (BIT) ×1 IMPLANT
BRUSH SCRUB EZ PLAIN DRY (MISCELLANEOUS) ×4 IMPLANT
BUR MATCHSTICK NEURO 3.0 LAGG (BURR) ×2 IMPLANT
CANISTER SUCT 3000ML PPV (MISCELLANEOUS) ×2 IMPLANT
CONT SPEC 4OZ CLIKSEAL STRL BL (MISCELLANEOUS) ×2 IMPLANT
DRAPE C-ARM 42X72 X-RAY (DRAPES) ×4 IMPLANT
DRAPE LAPAROTOMY 100X72 PEDS (DRAPES) ×2 IMPLANT
DRAPE MICROSCOPE LEICA (MISCELLANEOUS) ×2 IMPLANT
DRAPE POUCH INSTRU U-SHP 10X18 (DRAPES) ×2 IMPLANT
DRAPE SURG 17X23 STRL (DRAPES) ×4 IMPLANT
DRILL BIT TRINICA 2.3MM (BIT) ×2
DRSG OPSITE POSTOP 3X4 (GAUZE/BANDAGES/DRESSINGS) ×2 IMPLANT
DRSG OPSITE POSTOP 4X6 (GAUZE/BANDAGES/DRESSINGS) ×2 IMPLANT
DRSG TELFA 3X8 NADH (GAUZE/BANDAGES/DRESSINGS) ×2 IMPLANT
DURAPREP 6ML APPLICATOR 50/CS (WOUND CARE) ×2 IMPLANT
ELECT COATED BLADE 2.86 ST (ELECTRODE) ×2 IMPLANT
ELECT REM PT RETURN 9FT ADLT (ELECTROSURGICAL) ×2
ELECTRODE REM PT RTRN 9FT ADLT (ELECTROSURGICAL) ×1 IMPLANT
GAUZE SPONGE 4X4 12PLY STRL (GAUZE/BANDAGES/DRESSINGS) ×2 IMPLANT
GAUZE SPONGE 4X4 16PLY XRAY LF (GAUZE/BANDAGES/DRESSINGS) IMPLANT
GLOVE BIOGEL PI IND STRL 8.5 (GLOVE) ×4 IMPLANT
GLOVE BIOGEL PI INDICATOR 8.5 (GLOVE) ×4
GLOVE ECLIPSE 8.0 STRL XLNG CF (GLOVE) ×2 IMPLANT
GLOVE ECLIPSE 8.5 STRL (GLOVE) ×4 IMPLANT
GLOVE EXAM NITRILE LRG STRL (GLOVE) IMPLANT
GLOVE EXAM NITRILE XL STR (GLOVE) IMPLANT
GLOVE EXAM NITRILE XS STR PU (GLOVE) IMPLANT
GOWN STRL REUS W/ TWL LRG LVL3 (GOWN DISPOSABLE) IMPLANT
GOWN STRL REUS W/ TWL XL LVL3 (GOWN DISPOSABLE) ×2 IMPLANT
GOWN STRL REUS W/TWL 2XL LVL3 (GOWN DISPOSABLE) ×2 IMPLANT
GOWN STRL REUS W/TWL LRG LVL3 (GOWN DISPOSABLE)
GOWN STRL REUS W/TWL XL LVL3 (GOWN DISPOSABLE) ×2
HALTER HD/CHIN CERV TRACTION D (MISCELLANEOUS) ×2 IMPLANT
INTERBODY TM 11X14X7-7DEG ANG (Metal Cage) ×2 IMPLANT
KIT BASIN OR (CUSTOM PROCEDURE TRAY) ×2 IMPLANT
KIT ROOM TURNOVER OR (KITS) ×2 IMPLANT
NEEDLE SPNL 20GX3.5 QUINCKE YW (NEEDLE) ×2 IMPLANT
NS IRRIG 1000ML POUR BTL (IV SOLUTION) ×2 IMPLANT
PACK LAMINECTOMY NEURO (CUSTOM PROCEDURE TRAY) ×2 IMPLANT
PAD ARMBOARD 7.5X6 YLW CONV (MISCELLANEOUS) ×2 IMPLANT
PATTIES SURGICAL .25X.25 (GAUZE/BANDAGES/DRESSINGS) IMPLANT
PATTIES SURGICAL .75X.75 (GAUZE/BANDAGES/DRESSINGS) ×2 IMPLANT
PLATE 22MM (Plate) ×2 IMPLANT
PUTTY BONE GRAFT KIT 2.5ML (Bone Implant) ×2 IMPLANT
RUBBERBAND STERILE (MISCELLANEOUS) ×4 IMPLANT
SCREW SD FIXED 12MM (Screw) ×8 IMPLANT
SPONGE INTESTINAL PEANUT (DISPOSABLE) ×2 IMPLANT
SPONGE SURGIFOAM ABS GEL SZ50 (HEMOSTASIS) ×2 IMPLANT
STRIP CLOSURE SKIN 1/2X4 (GAUZE/BANDAGES/DRESSINGS) ×2 IMPLANT
SUT PDS AB 5-0 P3 18 (SUTURE) ×2 IMPLANT
SUT VIC AB 3-0 CP2 18 (SUTURE) ×2 IMPLANT
SYR 20ML ECCENTRIC (SYRINGE) IMPLANT
TOWEL OR 17X24 6PK STRL BLUE (TOWEL DISPOSABLE) ×2 IMPLANT
TOWEL OR 17X26 10 PK STRL BLUE (TOWEL DISPOSABLE) ×2 IMPLANT
TRAP SPECIMEN MUCOUS 40CC (MISCELLANEOUS) IMPLANT
WATER STERILE IRR 1000ML POUR (IV SOLUTION) ×2 IMPLANT

## 2015-01-31 NOTE — H&P (Signed)
Laura Hale is an 69 y.o. female.   Chief Complaint: Right arm pain HPI: The patient is a 69 year old female who is here for evaluation right arm pain of 6-8 weeks duration. She woke up with intrascapular pain and after 3-4 weeks 1 towards her right chest and arm. Shortness emergency room where she had a workup for cardiac issues that was negative. She is given some sterilize without relief. An MRI scan was done and she was referred for evaluation. When seen in the office the patient complained of pain going down her arm which goes and the thumb index and middle fingers of right hand. She has a great deal of pain by the elbow. She's tried gabapentin and Flexeril all without relief. Her scan was reviewed which showed a very large disc herniation at C6-7 on the right which fit well with her clinical presentation. After discussing the options the patient requested surgery and now comes for an anterior cervical discectomy with fusion and plating. I had a long discussion with her regarding the risks and benefits of surgical intervention. The risks discussed include but are not limited to bleeding infection weakness numbness paralysis spinal fluid leak coma quadriplegia hoarseness and death. We have discussed alternative methods of therapy along with the risks and benefits of nonintervention. She's had the opportunity to ask numerous questions and appears to understand. With this information in hand she has requested we proceed with surgery.  Past Medical History  Diagnosis Date  . Asthma     allergy induced asthma   . Arthritis     elbow, hands, neck     Past Surgical History  Procedure Laterality Date  . Elbow surgery Left 04/2003    x2  & for repair & then remove all hardware, due to injury from a fall  . Hand surgery Right 2006    injury- then eventually lost remainder of thumb  . Abdominal hysterectomy      No family history on file. Social History:  reports that she has quit smoking. She  quit smokeless tobacco use about 30 years ago. She reports that she drinks alcohol. She reports that she does not use illicit drugs.  Allergies: No Known Allergies  Medications Prior to Admission  Medication Sig Dispense Refill  . aspirin 81 MG tablet Take 81 mg by mouth daily.    Marland Kitchen atorvastatin (LIPITOR) 80 MG tablet Take 40 mg by mouth every morning.     Marland Kitchen azelastine (ASTELIN) 0.1 % nasal spray Place 1 spray into both nostrils daily as needed for allergies.     . cetirizine (ZYRTEC) 10 MG tablet Take 10 mg by mouth daily.    . Cholecalciferol (VITAMIN D3) 1000 UNITS CAPS Take 1,000 Units by mouth daily.    Marland Kitchen gabapentin (NEURONTIN) 100 MG capsule Take 1 capsule (100 mg total) by mouth 3 (three) times daily. (Patient taking differently: Take 100 mg by mouth 3 (three) times daily. 100mg . Morning & again midday & 200mg . At bedtime) 30 capsule 0  . naproxen sodium (ANAPROX) 220 MG tablet Take 220 mg by mouth 2 (two) times daily with a meal.    . oxyCODONE-acetaminophen (ROXICET) 5-325 MG per tablet Take 1-2 tablets by mouth every 4 (four) hours as needed for severe pain. 15 tablet 0  . cyclobenzaprine (FLEXERIL) 10 MG tablet Take 1 tablet (10 mg total) by mouth every 8 (eight) hours as needed for muscle spasms. (Patient not taking: Reported on 01/26/2015) 30 tablet 1    No  results found for this or any previous visit (from the past 1 hour(s)). No results found.  Positive for stress incontinence and asthma  Blood pressure 136/74, pulse 92, temperature 97.8 F (36.6 C), temperature source Oral, resp. rate 20, SpO2 97 %.  The patient is awake or and oriented. She is no facial asymmetry. Her gait is nonantalgic. Reflexes are decreased the right tricep and she has marked decreased strength of the right triceps muscle. Sensation is intact Assessment/Plan Impression is that of a C7 radiculopathy do a herniated disc at C6-7. The plan is for a C7 anterior cervical discectomy with fusion and  plating.  Faythe Ghee, MD 01/31/2015, 7:28 AM

## 2015-01-31 NOTE — Anesthesia Preprocedure Evaluation (Addendum)
Anesthesia Evaluation  Patient identified by MRN, date of birth, ID band Patient awake    Reviewed: Allergy & Precautions, NPO status , Patient's Chart, lab work & pertinent test results  Airway Mallampati: II  TM Distance: >3 FB Neck ROM: Full    Dental   Pulmonary asthma , former smoker,  breath sounds clear to auscultation        Cardiovascular negative cardio ROS  Rhythm:Regular Rate:Normal     Neuro/Psych    GI/Hepatic negative GI ROS, Neg liver ROS,   Endo/Other  negative endocrine ROS  Renal/GU negative Renal ROS     Musculoskeletal  (+) Arthritis -,   Abdominal   Peds  Hematology   Anesthesia Other Findings   Reproductive/Obstetrics                           Anesthesia Physical Anesthesia Plan  ASA: III  Anesthesia Plan: General   Post-op Pain Management:    Induction: Intravenous  Airway Management Planned: Oral ETT  Additional Equipment:   Intra-op Plan:   Post-operative Plan: Extubation in OR  Informed Consent: I have reviewed the patients History and Physical, chart, labs and discussed the procedure including the risks, benefits and alternatives for the proposed anesthesia with the patient or authorized representative who has indicated his/her understanding and acceptance.   Dental advisory given  Plan Discussed with: CRNA, Anesthesiologist and Surgeon  Anesthesia Plan Comments:        Anesthesia Quick Evaluation

## 2015-01-31 NOTE — Anesthesia Postprocedure Evaluation (Signed)
  Anesthesia Post-op Note  Patient: Laura Hale  Procedure(s) Performed: Procedure(s): ANTERIOR CERVICAL DECOMPRESSION/DISCECTOMY FUSION CERVICAL FIVE SIX (N/A)  Patient Location: PACU  Anesthesia Type:General  Level of Consciousness: awake  Airway and Oxygen Therapy: Patient Spontanous Breathing  Post-op Pain: mild  Post-op Assessment: Post-op Vital signs reviewed LLE Motor Response: Purposeful movement, Responds to commands LLE Sensation: No numbness RLE Motor Response: Purposeful movement, Responds to commands RLE Sensation: No numbness      Post-op Vital Signs: Reviewed  Last Vitals:  Filed Vitals:   01/31/15 1005  BP:   Pulse:   Temp: 36.8 C  Resp:     Complications: No apparent anesthesia complications

## 2015-01-31 NOTE — Care Management (Signed)
Utilization review completed by Oda Placke N. Briarrose Shor, RN BSN 

## 2015-01-31 NOTE — Plan of Care (Signed)
Problem: Consults Goal: Diagnosis - Spinal Surgery Outcome: Completed/Met Date Met:  01/31/15 Cervical Spine Fusion     

## 2015-01-31 NOTE — Op Note (Signed)
Preop diagnosis: Herniated disc C6-7 right Postop diagnosis: Same Procedure: C6-7 decompressive anterior cervical discectomy with trabecular metal interbody fusion and Trinica anterior cervical plating Surgeon: Zerline Melchior Asst.: Elsner  After being placed in the supine position and 10 pounds halter traction the patient's neck was prepped and draped in the usual sterile fashion. Localizing x-rays taken prior to incision to identify the appropriate level. Transverse incision was made in the right anterior neck started midline and headed towards the medial aspect of the sternocleidomastoid muscle. The platysma muscle was then incised transversely and the natural fascial plane between the strap muscles medially and the sternocleidomastoid laterally was identified and followed down to the anterior aspect the cervical spine. Longus Cole muscles were identified split in the midline and stripped away bilaterally with unipolar coagulation and Kitner dissection. Self-retaining tract was placed for exposure and x-ray showed approach the appropriate level. Using a 15 blade the as the disc at C6-7 was incised. Using pituitary rongeurs and curettes approximately 90% of the disc material was removed. High-speed drill was used to widen the interspace and bony shavings were saved for use later in the case. At this time the microscope was draped brought in the field and used for the remainder of the case. Using microdissection technique the remainder of the disc material down the posterior longitudinal ligament was removed. Ligament was then incised transversely and the cut edges removed a Kerrison punch. Thorough decompression was carried out bilaterally along the spinal dura into the foramen bilaterally. In the right foramen there are large number of herniated disc fragments and these were removed until the C7 nerve root was well visualized well decompressed. At this time inspection was carried out all directions for any  evidence of residual compression and none could be identified. Irrigation was carried out and any bleeding control proper coagulation Gelfoam. Measurements were taken and a 7 mm trabecular metal lordotic graft was chosen and filled with a mixture of autologous bone and morselized allograft. The plug was impacted without difficulty and fluoroscopy showed to be in good position. An appropriately length Trinica anterior cervical plate was then chosen. Self drilling screws were then placed without difficulty and the locking mechanism and rotated locked position. Final fossae showed good position of the plates screws and plugs. Irrigation was carried out and any bleeding controlled with upper coagulation Gelfoam. The was then closed with inverted Vicryls on the platysma muscle and subcuticular layer. Steri-Strips were placed on the skin. A sterile dressing was then applied the patient was extubated and taken to recovery room in stable condition.

## 2015-01-31 NOTE — Anesthesia Procedure Notes (Signed)
Date/Time: 01/31/2015 7:47 AM Performed by: Tamala Fothergill S Patient Re-evaluated:Patient Re-evaluated prior to inductionOxygen Delivery Method: Circle system utilized Preoxygenation: Pre-oxygenation with 100% oxygen Intubation Type: IV induction Ventilation: Mask ventilation without difficulty and Oral airway inserted - appropriate to patient size Laryngoscope Size: Sabra Heck and 2 Grade View: Grade II Tube type: Oral Tube size: 7.5 mm Number of attempts: 1 Placement Confirmation: ETT inserted through vocal cords under direct vision,  breath sounds checked- equal and bilateral and positive ETCO2 Tube secured with: Tape Dental Injury: Teeth and Oropharynx as per pre-operative assessment

## 2015-01-31 NOTE — Transfer of Care (Signed)
Immediate Anesthesia Transfer of Care Note  Patient: Laura Hale  Procedure(s) Performed: Procedure(s): ANTERIOR CERVICAL DECOMPRESSION/DISCECTOMY FUSION CERVICAL FIVE SIX (N/A)  Patient Location: PACU  Anesthesia Type:General  Level of Consciousness: awake, alert  and oriented  Airway & Oxygen Therapy: Patient Spontanous Breathing and Patient connected to nasal cannula oxygen  Post-op Assessment: Report given to RN and Post -op Vital signs reviewed and stable  Post vital signs: Reviewed and stable  Last Vitals:  Filed Vitals:   01/31/15 0554  BP: 136/74  Pulse: 92  Temp: 36.6 C  Resp: 20    Complications: No apparent anesthesia complications

## 2015-02-01 ENCOUNTER — Encounter (HOSPITAL_COMMUNITY): Payer: Self-pay | Admitting: Neurosurgery

## 2015-02-01 MED ORDER — OXYCODONE HCL 5 MG PO TABS: 5.0000 mg | ORAL_TABLET | ORAL | Status: DC | PRN

## 2015-02-01 NOTE — Discharge Summary (Signed)
  Physician Discharge Summary  Patient ID: Laura Hale MRN: [id number] DOB/AGE: 67/30/1947 67 y.o.  Admit date: 01/31/2015 Discharge date: 02/01/2015  Admission Diagnoses:  Discharge Diagnoses:  Active Problems:   Herniated cervical disc   Discharged Condition: good  Hospital Course: Surgery yesterday for C 67 acdf. Did well with marked improvement in arm pain. Ambulated well. Home pod 1, specific instructions given.  Consults: None  Significant Diagnostic Studies: none  Treatments: surgery: C 67 acdf  Discharge Exam: Blood pressure 154/77, pulse 82, temperature 98.4 F (36.9 C), temperature source Oral, resp. rate 20, SpO2 96 %. Incision/Wound:clean and dry; no new neuro issues  Disposition: 01-Home or Self Care     Medication List    ASK your doctor about these medications        aspirin 81 MG tablet  Take 81 mg by mouth daily.     atorvastatin 80 MG tablet  Commonly known as:  LIPITOR  Take 40 mg by mouth every morning.     azelastine 0.1 % nasal spray  Commonly known as:  ASTELIN  Place 1 spray into both nostrils daily as needed for allergies.     cetirizine 10 MG tablet  Commonly known as:  ZYRTEC  Take 10 mg by mouth daily.     cyclobenzaprine 10 MG tablet  Commonly known as:  FLEXERIL  Take 1 tablet (10 mg total) by mouth every 8 (eight) hours as needed for muscle spasms.     gabapentin 100 MG capsule  Commonly known as:  NEURONTIN  Take 1 capsule (100 mg total) by mouth 3 (three) times daily.     naproxen sodium 220 MG tablet  Commonly known as:  ANAPROX  Take 220 mg by mouth 2 (two) times daily with a meal.     oxyCODONE-acetaminophen 5-325 MG per tablet  Commonly known as:  ROXICET  Take 1-2 tablets by mouth every 4 (four) hours as needed for severe pain.     Vitamin D3 1000 UNITS Caps  Take 1,000 Units by mouth daily.         At home rest most of the time. Get up 9 or 10 times each day and take a 15 or 20 minute walk. No riding in  the car and to your first postoperative appointment. If you have neck surgery you may shower from the chest down starting on the third postoperative day. If you had back surgery he may start showering on the third postoperative day with saran wrap wrapped around your incisional area 3 times. After the shower remove the saran wrap. Take pain medicine as needed and other medications as instructed. Call my office for an appointment.  SignedFaythe Ghee, MD 02/01/2015, 8:50 AM

## 2015-02-01 NOTE — Progress Notes (Signed)
Patient alert and oriented, mae's well, voiding adequate amount of urine, swallowing without difficulty, no c/o pain. Patient discharged home with family. Script and discharged instructions given to patient. Patient and family stated understanding of d/c instructions given and has an appointment with MD. 

## 2015-04-20 ENCOUNTER — Other Ambulatory Visit: Payer: Self-pay | Admitting: Internal Medicine

## 2015-04-20 DIAGNOSIS — Z1231 Encounter for screening mammogram for malignant neoplasm of breast: Secondary | ICD-10-CM

## 2015-05-16 ENCOUNTER — Other Ambulatory Visit: Payer: Self-pay | Admitting: Internal Medicine

## 2015-05-16 ENCOUNTER — Ambulatory Visit
Admission: RE | Admit: 2015-05-16 | Discharge: 2015-05-16 | Disposition: A | Discharge status: Home / Self Care | Payer: Medicare PPO | Source: Ambulatory Visit | Attending: Internal Medicine | Admitting: Internal Medicine

## 2015-05-16 DIAGNOSIS — Z1231 Encounter for screening mammogram for malignant neoplasm of breast: Secondary | ICD-10-CM | POA: Diagnosis not present

## 2015-07-20 DIAGNOSIS — M25511 Pain in right shoulder: Secondary | ICD-10-CM | POA: Insufficient documentation

## 2015-07-20 DIAGNOSIS — G8929 Other chronic pain: Secondary | ICD-10-CM | POA: Insufficient documentation

## 2015-07-20 HISTORY — DX: Other chronic pain: G89.29

## 2015-08-20 ENCOUNTER — Emergency Department
Admission: EM | Admit: 2015-08-20 | Discharge: 2015-08-20 | Disposition: A | Discharge status: Home / Self Care | Payer: PPO | Attending: Emergency Medicine | Admitting: Emergency Medicine

## 2015-08-20 ENCOUNTER — Emergency Department: Payer: PPO

## 2015-08-20 ENCOUNTER — Encounter: Payer: Self-pay | Admitting: Emergency Medicine

## 2015-08-20 DIAGNOSIS — W000XXA Fall on same level due to ice and snow, initial encounter: Secondary | ICD-10-CM | POA: Diagnosis not present

## 2015-08-20 DIAGNOSIS — S42211A Unspecified displaced fracture of surgical neck of right humerus, initial encounter for closed fracture: Secondary | ICD-10-CM | POA: Insufficient documentation

## 2015-08-20 DIAGNOSIS — Z79899 Other long term (current) drug therapy: Secondary | ICD-10-CM | POA: Insufficient documentation

## 2015-08-20 DIAGNOSIS — S4991XA Unspecified injury of right shoulder and upper arm, initial encounter: Secondary | ICD-10-CM | POA: Diagnosis not present

## 2015-08-20 DIAGNOSIS — S8001XA Contusion of right knee, initial encounter: Secondary | ICD-10-CM | POA: Diagnosis not present

## 2015-08-20 DIAGNOSIS — Y998 Other external cause status: Secondary | ICD-10-CM | POA: Insufficient documentation

## 2015-08-20 DIAGNOSIS — Y9289 Other specified places as the place of occurrence of the external cause: Secondary | ICD-10-CM | POA: Diagnosis not present

## 2015-08-20 DIAGNOSIS — M25461 Effusion, right knee: Secondary | ICD-10-CM | POA: Diagnosis not present

## 2015-08-20 DIAGNOSIS — Z87891 Personal history of nicotine dependence: Secondary | ICD-10-CM | POA: Insufficient documentation

## 2015-08-20 DIAGNOSIS — Y9389 Activity, other specified: Secondary | ICD-10-CM | POA: Insufficient documentation

## 2015-08-20 MED ORDER — HYDROCODONE-ACETAMINOPHEN 5-325 MG PO TABS: 1.0000 | ORAL_TABLET | ORAL | Status: DC | PRN

## 2015-08-20 MED ORDER — HYDROCODONE-ACETAMINOPHEN 5-325 MG PO TABS
1.0000 | ORAL_TABLET | Freq: Once | ORAL | Status: AC
  Administered 2015-08-20: 1 via ORAL
  Filled 2015-08-20: qty 1

## 2015-08-20 NOTE — ED Notes (Signed)
Fell on ice last night, with rt arm pain, rt knee pain

## 2015-08-20 NOTE — Discharge Instructions (Signed)
Humerus Fracture Treated With Immobilization The humerus is the large bone in your upper arm. You have a broken (fractured) humerus. These fractures are easily diagnosed with X-rays. TREATMENT  Simple fractures which will heal without disability are treated with simple immobilization. Immobilization means you will wear a cast, splint, or sling. You have a fracture which will do well with immobilization. The fracture will heal well simply by being held in a good position until it is stable enough to begin range of motion exercises. Do not take part in activities which would further injure your arm.  HOME CARE INSTRUCTIONS   Put ice on the injured area.  Put ice in a plastic bag.  Place a towel between your skin and the bag.  Leave the ice on for 15-20 minutes, 03-04 times a day.  If you have a cast:  Do not scratch the skin under the cast using sharp or pointed objects.  Check the skin around the cast every day. You may put lotion on any red or sore areas.  Keep your cast dry and clean.  If you have a splint:  Wear the splint as directed.  Keep your splint dry and clean.  You may loosen the elastic around the splint if your fingers become numb, tingle, or turn cold or blue.  If you have a sling:  Wear the sling as directed.  Do not put pressure on any part of your cast or splint until it is fully hardened.  Your cast or splint can be protected during bathing with a plastic bag. Do not lower the cast or splint into water.  Only take over-the-counter or prescription medicines for pain, discomfort, or fever as directed by your caregiver.  Do range of motion exercises as instructed by your caregiver.  Follow up as directed by your caregiver. This is very important in order to avoid permanent injury or disability and chronic pain. SEEK IMMEDIATE MEDICAL CARE IF:   Your skin or nails in the injured arm turn blue or gray.  Your arm feels cold or numb.  You develop severe pain  in the injured arm.  You are having problems with the medicines you were given. MAKE SURE YOU:   Understand these instructions.  Will watch your condition.  Will get help right away if you are not doing well or get worse.   This information is not intended to replace advice given to you by your health care provider. Make sure you discuss any questions you have with your health care provider.   Document Released: 11/04/2000 Document Revised: 08/19/2014 Document Reviewed: 12/21/2014 Elsevier Interactive Patient Education 2016 [place] and elevation until seen by the orthopedist. Keep your appointment with Dr. Roland Rack this week. Norco as needed for pain. Wear shoulder immobilizer for support and immobilization.

## 2015-08-20 NOTE — ED Provider Notes (Signed)
[facility] Emergency Department Provider Note  ____________________________________________  Time seen: Approximately 2:03 PM  I have reviewed the triage vital signs and the nursing notes.   HISTORY  Chief Complaint Arm Pain   HPI Laura Hale is a 70 y.o. female is here with complaint of right arm pain along with right knee pain after falling on ice approximately 9:30 PM last evening. Patient states it became very painful during the night. She has taken some Tylenol at approximately 3 AM for her pain. Patient states is extremely painful to move her arm as well as stand on her knee. She denies hitting her head or any loss of consciousness during this accident. She has had no previous injury to her knee or shoulder. Currently she rates her pain as 4 out of 10.   Past Medical History  Diagnosis Date  . Asthma     allergy induced asthma   . Arthritis     elbow, hands, neck     Patient Active Problem List   Diagnosis Date Noted  . 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 Elbow surgery Left 04/2003    x2  & for repair & then remove all hardware, due to injury from a fall  . Hand surgery Right 2006    injury- then eventually lost remainder of thumb  . Abdominal hysterectomy    . Anterior cervical decomp/discectomy fusion N/A 01/31/2015    Procedure: ANTERIOR CERVICAL DECOMPRESSION/DISCECTOMY FUSION CERVICAL FIVE SIX;  Surgeon: Karie Chimera, MD;  Location: [facility];  Service: Neurosurgery;  Laterality: N/A;    Current Outpatient Rx  Name  Route  Sig  Dispense  Refill  . atorvastatin (LIPITOR) 80 MG tablet   Oral   Take 40 mg by mouth every morning.          Marland Kitchen azelastine (ASTELIN) 0.1 % nasal spray   Each Nare   Place 1 spray into both nostrils daily as needed for allergies.          . cetirizine (ZYRTEC) 10 MG tablet   Oral   Take 10 mg by mouth daily.         . Cholecalciferol  (VITAMIN D3) 1000 UNITS CAPS   Oral   Take 1,000 Units by mouth daily.         Marland Kitchen gabapentin (NEURONTIN) 100 MG capsule   Oral   Take 1 capsule (100 mg total) by mouth 3 (three) times daily. Patient taking differently: Take 100 mg by mouth 3 (three) times daily. 100mg . Morning & again midday & 200mg . At bedtime   30 capsule   0   . HYDROcodone-acetaminophen (NORCO/VICODIN) 5-325 MG tablet   Oral   Take 1 tablet by mouth every 4 (four) hours as needed for moderate pain.   30 tablet   0     Allergies Review of patient's allergies indicates no known allergies.  Family History  Problem Relation Age of Onset  . Breast cancer Sister     Social History Social History  Substance Use Topics  . Smoking status: Former Research scientist (life sciences)  . Smokeless tobacco: Former Systems developer    Quit date: 11/26/1984  . Alcohol Use: Yes     Comment: 5-7 glasses of wine  in a week     Review of Systems Constitutional: No fever/chills Eyes: No visual changes. ENT: No trauma Cardiovascular: Denies chest pain. Respiratory: Denies shortness of breath. Gastrointestinal: No abdominal pain.  No nausea, no vomiting.   Musculoskeletal: Negative for back pain. Positive for right arm and right knee pain Skin: Negative for rash. Neurological: Negative for headaches, focal weakness or numbness.  10-point ROS otherwise negative.  ____________________________________________   PHYSICAL EXAM:  VITAL SIGNS: ED Triage Vitals  Enc Vitals Group     BP 08/20/15 1335 144/83 mmHg     Pulse Rate 08/20/15 1335 77     Resp 08/20/15 1335 16     Temp 08/20/15 1335 98.1 F (36.7 C)     Temp Source 08/20/15 1335 Oral     SpO2 08/20/15 1335 96 %     Weight 08/20/15 1335 248 lb (112.492 kg)     Height 08/20/15 1335 5\' 6"  (1.676 m)     Head Cir --      Peak Flow --      Pain Score 08/20/15 1335 4     Pain Loc --      Pain Edu? --      Excl. in [place]? --     Constitutional: Alert and oriented. Well appearing and in no acute  distress. Eyes: Conjunctivae are normal. PERRL. EOMI. Head: Atraumatic. Nose: No congestion/rhinnorhea. Mouth/Throat: Mucous membranes are moist.  Neck: No stridor.  Nontender to palpations cervical spine posteriorly. Cardiovascular: Normal rate, regular rhythm. Grossly normal heart sounds.  Good peripheral circulation. Respiratory: Normal respiratory effort.  No retractions. Lungs CTAB. Gastrointestinal: Soft and nontender. No distention.  Musculoskeletal: Moderate tenderness to palpation right arm proximal humerus with restricted range of motion secondary patient's pain. No gross deformity was noted. Right anterior knee moderate tenderness on palpation medially. The knee has degenerative appearance and in no obvious effusion is appreciated. Range of motion is restricted secondary to patient's pain. Neurologic:  Normal speech and language. No gross focal neurologic deficits are appreciated. No gait instability. Skin:  Skin is warm, dry and intact. No rash noted. No abrasions, ecchymosis or erythema was noted. Psychiatric: Mood and affect are normal. Speech and behavior are normal.  ____________________________________________   LABS (all labs ordered are listed, but only abnormal results are displayed)  Labs Reviewed - No data to display   RADIOLOGY  Right humerus minimally impacted surgical neck fracture per radiologist. Right knee small joint effusion with mild osteoarthritis per radiologist. Leana Gamer, personally viewed and evaluated these images (plain radiographs) as part of my medical decision making, as well as reviewing the written report by the radiologist. ____________________________________________   PROCEDURES  Procedure(s) performed: None  Critical Care performed: No  ____________________________________________   INITIAL IMPRESSION / ASSESSMENT AND PLAN / ED COURSE  Pertinent labs & imaging results that were available during my care of the patient  were reviewed by me and considered in my medical decision making (see chart for details).  Patient was placed in a shoulder immobilizer and patient was given a prescription for Norco as needed for pain. She is to keep her appointment with the orthopedist that she has scheduled this week. ____________________________________________   FINAL CLINICAL IMPRESSION(S) / ED DIAGNOSES  Final diagnoses:  Humeral surgical neck fracture, right, closed, initial encounter  Contusion of right knee, initial encounter      Johnn Hai, PA-C 08/20/15 1554  Lavonia Drafts, MD 08/21/15 1426

## 2015-08-23 ENCOUNTER — Ambulatory Visit
Admission: RE | Admit: 2015-08-23 | Discharge: 2015-08-23 | Disposition: A | Discharge status: Home / Self Care | Payer: PPO | Source: Ambulatory Visit | Attending: Surgery | Admitting: Surgery

## 2015-08-23 ENCOUNTER — Other Ambulatory Visit: Payer: Self-pay | Admitting: Surgery

## 2015-08-23 DIAGNOSIS — S42251A Displaced fracture of greater tuberosity of right humerus, initial encounter for closed fracture: Secondary | ICD-10-CM | POA: Diagnosis not present

## 2015-08-23 DIAGNOSIS — M19011 Primary osteoarthritis, right shoulder: Secondary | ICD-10-CM | POA: Insufficient documentation

## 2015-08-23 DIAGNOSIS — M25411 Effusion, right shoulder: Secondary | ICD-10-CM | POA: Diagnosis not present

## 2015-08-23 DIAGNOSIS — M19012 Primary osteoarthritis, left shoulder: Secondary | ICD-10-CM | POA: Diagnosis not present

## 2015-08-23 DIAGNOSIS — S42291A Other displaced fracture of upper end of right humerus, initial encounter for closed fracture: Secondary | ICD-10-CM | POA: Diagnosis not present

## 2015-08-23 DIAGNOSIS — W19XXXA Unspecified fall, initial encounter: Secondary | ICD-10-CM | POA: Insufficient documentation

## 2015-08-28 ENCOUNTER — Inpatient Hospital Stay: Admission: RE | Admit: 2015-08-28 | Payer: PPO | Source: Ambulatory Visit

## 2015-08-28 ENCOUNTER — Encounter: Payer: Self-pay | Admitting: *Deleted

## 2015-08-28 NOTE — Patient Instructions (Signed)
  Your procedure is scheduled on: 08-31-15 Report to [facility] To find out your arrival time please call [phone] between 1PM - 3PM on 08-30-15  Remember: Instructions that are not followed completely may result in serious medical risk, up to and including death, or upon the discretion of your surgeon and anesthesiologist your surgery may need to be rescheduled.    _X___ 1. Do not eat food or drink liquids after midnight. No gum chewing or hard candies.     _X___ 2. No Alcohol for 24 hours before or after surgery.   ____ 3. Bring all medications with you on the day of surgery if instructed.    ____ 4. Notify your doctor if there is any change in your medical condition     (cold, fever, infections).     Do not wear jewelry, make-up, hairpins, clips or nail polish.  Do not wear lotions, powders, or perfumes. You may wear deodorant.  Do not shave 48 hours prior to surgery. Men may shave face and neck.  Do not bring valuables to the hospital.    [facility] is not responsible for any belongings or valuables.               Contacts, dentures or bridgework may not be worn into surgery.  Leave your suitcase in the car. After surgery it may be brought to your room.  For patients admitted to the hospital, discharge time is determined by your treatment team.   Patients discharged the day of surgery will not be allowed to drive home.   Please read over the following fact sheets that you were given:     _X___ Take these medicines the morning of surgery with A SIP OF WATER:    1.ATORVASTATIN  2. MAY TAKE HYDROCODONE AM OF SURGERY IF NEEDED WITH A SMALL SIP OF WATER  3.   4.  5.  6.  ____ Fleet Enema (as directed)   ____ Use CHG Soap as directed  ____ Use inhalers on the day of surgery  ____ Stop metformin 2 days prior to surgery    ____ Take 1/2 of usual insulin dose the night before surgery and none on the morning of surgery.   ____ Stop  Coumadin/Plavix/aspirin-PT STOPPED ASA ON 08-23-15   ____ Stop Anti-inflammatories-NO NSAIDS OR ASA PRODUCTS-HYDROCODONE OK TO CONTINUE   ____ Stop supplements until after surgery.    ____ Bring C-Pap to the hospital.

## 2015-08-29 ENCOUNTER — Encounter
Admission: RE | Admit: 2015-08-29 | Discharge: 2015-08-29 | Disposition: A | Discharge status: Home / Self Care | Payer: PPO | Source: Ambulatory Visit | Attending: Surgery | Admitting: Surgery

## 2015-08-29 DIAGNOSIS — E669 Obesity, unspecified: Secondary | ICD-10-CM | POA: Diagnosis not present

## 2015-08-29 DIAGNOSIS — Z800 Family history of malignant neoplasm of digestive organs: Secondary | ICD-10-CM | POA: Diagnosis not present

## 2015-08-29 DIAGNOSIS — E785 Hyperlipidemia, unspecified: Secondary | ICD-10-CM | POA: Diagnosis not present

## 2015-08-29 DIAGNOSIS — Z8262 Family history of osteoporosis: Secondary | ICD-10-CM | POA: Diagnosis not present

## 2015-08-29 DIAGNOSIS — Z801 Family history of malignant neoplasm of trachea, bronchus and lung: Secondary | ICD-10-CM | POA: Diagnosis not present

## 2015-08-29 DIAGNOSIS — Z7982 Long term (current) use of aspirin: Secondary | ICD-10-CM | POA: Diagnosis not present

## 2015-08-29 DIAGNOSIS — S42251A Displaced fracture of greater tuberosity of right humerus, initial encounter for closed fracture: Secondary | ICD-10-CM | POA: Diagnosis not present

## 2015-08-29 DIAGNOSIS — Z823 Family history of stroke: Secondary | ICD-10-CM | POA: Diagnosis not present

## 2015-08-29 DIAGNOSIS — Z86010 Personal history of colonic polyps: Secondary | ICD-10-CM | POA: Diagnosis not present

## 2015-08-29 DIAGNOSIS — Z89011 Acquired absence of right thumb: Secondary | ICD-10-CM | POA: Diagnosis not present

## 2015-08-29 DIAGNOSIS — Z79891 Long term (current) use of opiate analgesic: Secondary | ICD-10-CM | POA: Diagnosis not present

## 2015-08-29 DIAGNOSIS — M1258 Traumatic arthropathy, other specified site: Secondary | ICD-10-CM | POA: Diagnosis not present

## 2015-08-29 DIAGNOSIS — Z9889 Other specified postprocedural states: Secondary | ICD-10-CM | POA: Diagnosis not present

## 2015-08-29 DIAGNOSIS — Z79899 Other long term (current) drug therapy: Secondary | ICD-10-CM | POA: Diagnosis not present

## 2015-08-29 DIAGNOSIS — W009XXA Unspecified fall due to ice and snow, initial encounter: Secondary | ICD-10-CM | POA: Diagnosis not present

## 2015-08-29 DIAGNOSIS — Z6839 Body mass index (BMI) 39.0-39.9, adult: Secondary | ICD-10-CM | POA: Diagnosis not present

## 2015-08-29 DIAGNOSIS — Z90710 Acquired absence of both cervix and uterus: Secondary | ICD-10-CM | POA: Diagnosis not present

## 2015-08-29 DIAGNOSIS — Z981 Arthrodesis status: Secondary | ICD-10-CM | POA: Diagnosis not present

## 2015-08-29 DIAGNOSIS — J449 Chronic obstructive pulmonary disease, unspecified: Secondary | ICD-10-CM | POA: Diagnosis not present

## 2015-08-29 DIAGNOSIS — Z803 Family history of malignant neoplasm of breast: Secondary | ICD-10-CM | POA: Diagnosis not present

## 2015-08-29 DIAGNOSIS — Z85828 Personal history of other malignant neoplasm of skin: Secondary | ICD-10-CM | POA: Diagnosis not present

## 2015-08-29 DIAGNOSIS — Z8249 Family history of ischemic heart disease and other diseases of the circulatory system: Secondary | ICD-10-CM | POA: Diagnosis not present

## 2015-08-29 LAB — ABO/RH: ABO/RH(D): A POS

## 2015-08-29 LAB — TYPE AND SCREEN
ABO/RH(D): A POS
ANTIBODY SCREEN: NEGATIVE

## 2015-08-31 ENCOUNTER — Encounter: Payer: Self-pay | Admitting: *Deleted

## 2015-08-31 ENCOUNTER — Ambulatory Visit: Payer: PPO | Admitting: Certified Registered Nurse Anesthetist

## 2015-08-31 ENCOUNTER — Ambulatory Visit: Payer: PPO

## 2015-08-31 ENCOUNTER — Encounter
Admission: RE | Disposition: A | Discharge status: Home / Self Care | Payer: Self-pay | Source: Ambulatory Visit | Attending: Surgery

## 2015-08-31 ENCOUNTER — Ambulatory Visit
Admission: RE | Admit: 2015-08-31 | Discharge: 2015-08-31 | Disposition: A | Discharge status: Home / Self Care | Payer: PPO | Source: Ambulatory Visit | Attending: Surgery | Admitting: Surgery

## 2015-08-31 DIAGNOSIS — J449 Chronic obstructive pulmonary disease, unspecified: Secondary | ICD-10-CM | POA: Insufficient documentation

## 2015-08-31 DIAGNOSIS — Z823 Family history of stroke: Secondary | ICD-10-CM | POA: Insufficient documentation

## 2015-08-31 DIAGNOSIS — Z6839 Body mass index (BMI) 39.0-39.9, adult: Secondary | ICD-10-CM | POA: Insufficient documentation

## 2015-08-31 DIAGNOSIS — Z800 Family history of malignant neoplasm of digestive organs: Secondary | ICD-10-CM | POA: Insufficient documentation

## 2015-08-31 DIAGNOSIS — Z89011 Acquired absence of right thumb: Secondary | ICD-10-CM | POA: Insufficient documentation

## 2015-08-31 DIAGNOSIS — Z79899 Other long term (current) drug therapy: Secondary | ICD-10-CM | POA: Insufficient documentation

## 2015-08-31 DIAGNOSIS — Z801 Family history of malignant neoplasm of trachea, bronchus and lung: Secondary | ICD-10-CM | POA: Insufficient documentation

## 2015-08-31 DIAGNOSIS — E669 Obesity, unspecified: Secondary | ICD-10-CM | POA: Insufficient documentation

## 2015-08-31 DIAGNOSIS — S42251A Displaced fracture of greater tuberosity of right humerus, initial encounter for closed fracture: Secondary | ICD-10-CM | POA: Insufficient documentation

## 2015-08-31 DIAGNOSIS — Z86010 Personal history of colonic polyps: Secondary | ICD-10-CM | POA: Insufficient documentation

## 2015-08-31 DIAGNOSIS — Z8262 Family history of osteoporosis: Secondary | ICD-10-CM | POA: Insufficient documentation

## 2015-08-31 DIAGNOSIS — S42201A Unspecified fracture of upper end of right humerus, initial encounter for closed fracture: Secondary | ICD-10-CM | POA: Diagnosis not present

## 2015-08-31 DIAGNOSIS — Z7982 Long term (current) use of aspirin: Secondary | ICD-10-CM | POA: Insufficient documentation

## 2015-08-31 DIAGNOSIS — E785 Hyperlipidemia, unspecified: Secondary | ICD-10-CM | POA: Insufficient documentation

## 2015-08-31 DIAGNOSIS — Z85828 Personal history of other malignant neoplasm of skin: Secondary | ICD-10-CM | POA: Insufficient documentation

## 2015-08-31 DIAGNOSIS — Z803 Family history of malignant neoplasm of breast: Secondary | ICD-10-CM | POA: Insufficient documentation

## 2015-08-31 DIAGNOSIS — Z79891 Long term (current) use of opiate analgesic: Secondary | ICD-10-CM | POA: Insufficient documentation

## 2015-08-31 DIAGNOSIS — Z9889 Other specified postprocedural states: Secondary | ICD-10-CM | POA: Insufficient documentation

## 2015-08-31 DIAGNOSIS — M1258 Traumatic arthropathy, other specified site: Secondary | ICD-10-CM | POA: Insufficient documentation

## 2015-08-31 DIAGNOSIS — Z8249 Family history of ischemic heart disease and other diseases of the circulatory system: Secondary | ICD-10-CM | POA: Insufficient documentation

## 2015-08-31 DIAGNOSIS — S42291A Other displaced fracture of upper end of right humerus, initial encounter for closed fracture: Secondary | ICD-10-CM | POA: Diagnosis not present

## 2015-08-31 DIAGNOSIS — Z419 Encounter for procedure for purposes other than remedying health state, unspecified: Secondary | ICD-10-CM

## 2015-08-31 DIAGNOSIS — S42224A 2-part nondisplaced fracture of surgical neck of right humerus, initial encounter for closed fracture: Secondary | ICD-10-CM | POA: Diagnosis not present

## 2015-08-31 DIAGNOSIS — Z90710 Acquired absence of both cervix and uterus: Secondary | ICD-10-CM | POA: Insufficient documentation

## 2015-08-31 DIAGNOSIS — W009XXA Unspecified fall due to ice and snow, initial encounter: Secondary | ICD-10-CM | POA: Insufficient documentation

## 2015-08-31 DIAGNOSIS — J45909 Unspecified asthma, uncomplicated: Secondary | ICD-10-CM | POA: Diagnosis not present

## 2015-08-31 DIAGNOSIS — Z981 Arthrodesis status: Secondary | ICD-10-CM | POA: Insufficient documentation

## 2015-08-31 HISTORY — DX: Hyperlipidemia, unspecified: E78.5

## 2015-08-31 HISTORY — DX: Unspecified urinary incontinence: R32

## 2015-08-31 HISTORY — DX: Obesity, unspecified: E66.9

## 2015-08-31 HISTORY — DX: Other pervasive developmental disorders: F84.8

## 2015-08-31 HISTORY — PX: ORIF HUMERUS FRACTURE: SHX2126

## 2015-08-31 HISTORY — DX: Hypercalcemia: E83.52

## 2015-08-31 HISTORY — DX: Polyp of colon: K63.5

## 2015-08-31 HISTORY — DX: Other complications of anesthesia, initial encounter: T88.59XA

## 2015-08-31 HISTORY — DX: Adverse effect of unspecified anesthetic, initial encounter: T41.45XA

## 2015-08-31 SURGERY — OPEN REDUCTION INTERNAL FIXATION (ORIF) PROXIMAL HUMERUS FRACTURE
Anesthesia: General | Site: Shoulder | Laterality: Right | Wound class: Clean

## 2015-08-31 MED ORDER — METOCLOPRAMIDE HCL 10 MG PO TABS: 5.0000 mg | ORAL_TABLET | Freq: Three times a day (TID) | ORAL | Status: DC | PRN

## 2015-08-31 MED ORDER — ACETAMINOPHEN 10 MG/ML IV SOLN
INTRAVENOUS | Status: DC | PRN
  Administered 2015-08-31: 1000 mg via INTRAVENOUS

## 2015-08-31 MED ORDER — ROCURONIUM BROMIDE 100 MG/10ML IV SOLN
INTRAVENOUS | Status: DC | PRN
  Administered 2015-08-31: 50 mg via INTRAVENOUS

## 2015-08-31 MED ORDER — LIDOCAINE HCL (CARDIAC) 20 MG/ML IV SOLN
INTRAVENOUS | Status: DC | PRN
  Administered 2015-08-31 (×2): 100 mg via INTRAVENOUS

## 2015-08-31 MED ORDER — METOCLOPRAMIDE HCL 5 MG/ML IJ SOLN: 5.0000 mg | Freq: Three times a day (TID) | INTRAMUSCULAR | Status: DC | PRN

## 2015-08-31 MED ORDER — SUCCINYLCHOLINE CHLORIDE 20 MG/ML IJ SOLN
INTRAMUSCULAR | Status: DC | PRN
  Administered 2015-08-31: 100 mg via INTRAVENOUS

## 2015-08-31 MED ORDER — ONDANSETRON HCL 4 MG/2ML IJ SOLN: 4.0000 mg | Freq: Once | INTRAMUSCULAR | Status: DC | PRN

## 2015-08-31 MED ORDER — OXYCODONE HCL 5 MG PO TABS: 5.0000 mg | ORAL_TABLET | ORAL | Status: DC | PRN

## 2015-08-31 MED ORDER — BUPIVACAINE LIPOSOME 1.3 % IJ SUSP
INTRAMUSCULAR | Status: DC | PRN
  Administered 2015-08-31: 60 mL

## 2015-08-31 MED ORDER — FENTANYL CITRATE (PF) 100 MCG/2ML IJ SOLN
INTRAMUSCULAR | Status: DC | PRN
  Administered 2015-08-31: 150 ug via INTRAVENOUS
  Administered 2015-08-31: 100 ug via INTRAVENOUS

## 2015-08-31 MED ORDER — ONDANSETRON HCL 4 MG PO TABS: 4.0000 mg | ORAL_TABLET | Freq: Four times a day (QID) | ORAL | Status: DC | PRN

## 2015-08-31 MED ORDER — POTASSIUM CHLORIDE IN NACL 20-0.9 MEQ/L-% IV SOLN
INTRAVENOUS | Status: DC
  Filled 2015-08-31 (×3): qty 1000

## 2015-08-31 MED ORDER — GLYCOPYRROLATE 0.2 MG/ML IJ SOLN
INTRAMUSCULAR | Status: DC | PRN
  Administered 2015-08-31: 0.4 mg via INTRAVENOUS

## 2015-08-31 MED ORDER — OXYCODONE HCL 5 MG PO TABS
ORAL_TABLET | ORAL | Status: AC
  Administered 2015-08-31: 5 mg
  Filled 2015-08-31: qty 1

## 2015-08-31 MED ORDER — PROPOFOL 10 MG/ML IV BOLUS
INTRAVENOUS | Status: DC | PRN
  Administered 2015-08-31: 150 mg via INTRAVENOUS

## 2015-08-31 MED ORDER — FENTANYL CITRATE (PF) 100 MCG/2ML IJ SOLN
INTRAMUSCULAR | Status: AC
  Administered 2015-08-31: 25 ug via INTRAVENOUS
  Filled 2015-08-31: qty 2

## 2015-08-31 MED ORDER — FAMOTIDINE 20 MG PO TABS
ORAL_TABLET | ORAL | Status: AC
  Administered 2015-08-31: 20 mg via ORAL
  Filled 2015-08-31: qty 1

## 2015-08-31 MED ORDER — CEFAZOLIN SODIUM-DEXTROSE 2-3 GM-% IV SOLR
INTRAVENOUS | Status: AC
  Filled 2015-08-31: qty 50

## 2015-08-31 MED ORDER — PHENYLEPHRINE HCL 10 MG/ML IJ SOLN
10.0000 mg | INTRAVENOUS | Status: DC | PRN
  Administered 2015-08-31: 30 ug/min via INTRAVENOUS

## 2015-08-31 MED ORDER — HYDROMORPHONE HCL 1 MG/ML IJ SOLN
INTRAMUSCULAR | Status: DC | PRN
  Administered 2015-08-31 (×2): 0.5 mg via INTRAVENOUS

## 2015-08-31 MED ORDER — CEFAZOLIN SODIUM-DEXTROSE 2-3 GM-% IV SOLR: 2.0000 g | Freq: Once | INTRAVENOUS | Status: DC

## 2015-08-31 MED ORDER — LABETALOL HCL 5 MG/ML IV SOLN
INTRAVENOUS | Status: DC | PRN
  Administered 2015-08-31: 15 mg via INTRAVENOUS

## 2015-08-31 MED ORDER — ONDANSETRON HCL 4 MG/2ML IJ SOLN: 4.0000 mg | Freq: Four times a day (QID) | INTRAMUSCULAR | Status: DC | PRN

## 2015-08-31 MED ORDER — MIDAZOLAM HCL 2 MG/2ML IJ SOLN
INTRAMUSCULAR | Status: DC | PRN
  Administered 2015-08-31: 1 mg via INTRAVENOUS

## 2015-08-31 MED ORDER — FAMOTIDINE 20 MG PO TABS
20.0000 mg | ORAL_TABLET | Freq: Once | ORAL | Status: AC
  Administered 2015-08-31: 20 mg via ORAL

## 2015-08-31 MED ORDER — BUPIVACAINE-EPINEPHRINE (PF) 0.5% -1:200000 IJ SOLN
INTRAMUSCULAR | Status: AC
  Filled 2015-08-31: qty 30

## 2015-08-31 MED ORDER — ACETAMINOPHEN 10 MG/ML IV SOLN
INTRAVENOUS | Status: AC
  Filled 2015-08-31: qty 100

## 2015-08-31 MED ORDER — KETAMINE HCL 10 MG/ML IJ SOLN
INTRAMUSCULAR | Status: DC | PRN
  Administered 2015-08-31: 50 mg via INTRAVENOUS

## 2015-08-31 MED ORDER — PHENYLEPHRINE HCL 10 MG/ML IJ SOLN
INTRAMUSCULAR | Status: DC | PRN
  Administered 2015-08-31: 200 ug via INTRAVENOUS

## 2015-08-31 MED ORDER — NEOSTIGMINE METHYLSULFATE 10 MG/10ML IV SOLN
INTRAVENOUS | Status: DC | PRN
  Administered 2015-08-31: 3 mg via INTRAVENOUS

## 2015-08-31 MED ORDER — ONDANSETRON HCL 4 MG/2ML IJ SOLN
INTRAMUSCULAR | Status: DC | PRN
  Administered 2015-08-31: 4 mg via INTRAVENOUS

## 2015-08-31 MED ORDER — DEXAMETHASONE SODIUM PHOSPHATE 10 MG/ML IJ SOLN
INTRAMUSCULAR | Status: DC | PRN
  Administered 2015-08-31: 5 mg via INTRAVENOUS

## 2015-08-31 MED ORDER — FENTANYL CITRATE (PF) 100 MCG/2ML IJ SOLN
25.0000 ug | INTRAMUSCULAR | Status: DC | PRN
  Administered 2015-08-31 (×3): 25 ug via INTRAVENOUS

## 2015-08-31 MED ORDER — NEOMYCIN-POLYMYXIN B GU 40-200000 IR SOLN
Status: DC | PRN
  Administered 2015-08-31: 4 mL

## 2015-08-31 MED ORDER — NEOMYCIN-POLYMYXIN B GU 40-200000 IR SOLN
Status: AC
  Filled 2015-08-31: qty 4

## 2015-08-31 MED ORDER — BUPIVACAINE-EPINEPHRINE (PF) 0.5% -1:200000 IJ SOLN
INTRAMUSCULAR | Status: DC | PRN
  Administered 2015-08-31: 20 mL

## 2015-08-31 MED ORDER — LACTATED RINGERS IV SOLN
INTRAVENOUS | Status: DC
Start: 2015-08-31 — End: 2015-08-31
  Administered 2015-08-31: 50 mL/h via INTRAVENOUS

## 2015-08-31 MED ORDER — WHITE PETROLATUM GEL
Status: AC
  Filled 2015-08-31: qty 5

## 2015-08-31 SURGICAL SUPPLY — 46 items
BANDAGE ACE 4X5 VEL STRL LF (GAUZE/BANDAGES/DRESSINGS) ×4 IMPLANT
BNDG COHESIVE 4X5 TAN STRL (GAUZE/BANDAGES/DRESSINGS) IMPLANT
CANISTER SUCT 1200ML W/VALVE (MISCELLANEOUS) ×2 IMPLANT
CHLORAPREP W/TINT 26ML (MISCELLANEOUS) ×4 IMPLANT
DRAPE C-ARM XRAY 36X54 (DRAPES) ×2 IMPLANT
DRAPE INCISE IOBAN 66X45 STRL (DRAPES) ×4 IMPLANT
DRAPE U-SHAPE 47X51 STRL (DRAPES) ×2 IMPLANT
DRSG OPSITE POSTOP 4X8 (GAUZE/BANDAGES/DRESSINGS) ×2 IMPLANT
ELECT CAUTERY BLADE 6.4 (BLADE) ×2 IMPLANT
ELECT REM PT RETURN 9FT ADLT (ELECTROSURGICAL) ×2
ELECTRODE REM PT RTRN 9FT ADLT (ELECTROSURGICAL) ×1 IMPLANT
GAUZE PETRO XEROFOAM 1X8 (MISCELLANEOUS) ×2 IMPLANT
GAUZE SPONGE 4X4 12PLY STRL (GAUZE/BANDAGES/DRESSINGS) IMPLANT
GLOVE BIO SURGEON STRL SZ7.5 (GLOVE) ×6 IMPLANT
GLOVE BIO SURGEON STRL SZ8 (GLOVE) ×6 IMPLANT
GLOVE BIOGEL PI IND STRL 7.5 (GLOVE) ×1 IMPLANT
GLOVE BIOGEL PI IND STRL 8 (GLOVE) ×1 IMPLANT
GLOVE BIOGEL PI INDICATOR 7.5 (GLOVE) ×1
GLOVE BIOGEL PI INDICATOR 8 (GLOVE) ×1
GLOVE INDICATOR 8.0 STRL GRN (GLOVE) ×2 IMPLANT
GOWN L4 LG 24 PK N/S (GOWN DISPOSABLE) ×2 IMPLANT
GOWN STRL REUS W/ TWL LRG LVL3 (GOWN DISPOSABLE) ×2 IMPLANT
GOWN STRL REUS W/ TWL XL LVL3 (GOWN DISPOSABLE) ×1 IMPLANT
GOWN STRL REUS W/TWL LRG LVL3 (GOWN DISPOSABLE) ×2
GOWN STRL REUS W/TWL XL LVL3 (GOWN DISPOSABLE) ×1
HANDLE YANKAUER SUCT BULB TIP (MISCELLANEOUS) ×2 IMPLANT
KIT RM TURNOVER STRD PROC AR (KITS) ×2 IMPLANT
KIT STABILIZATION SHOULDER (MISCELLANEOUS) ×2 IMPLANT
MASK FACE SPIDER DISP (MASK) ×2 IMPLANT
NEEDLE HYPO 25X1 1.5 SAFETY (NEEDLE) ×2 IMPLANT
NS IRRIG 1000ML POUR BTL (IV SOLUTION) ×2 IMPLANT
PACK ARTHROSCOPY SHOULDER (MISCELLANEOUS) ×2 IMPLANT
PAD CAST CTTN 4X4 STRL (SOFTGOODS) IMPLANT
PADDING CAST COTTON 4X4 STRL (SOFTGOODS)
SLING ARM LRG DEEP (SOFTGOODS) ×2 IMPLANT
SLING ARM M TX990204 (SOFTGOODS) IMPLANT
STAPLER SKIN PROX 35W (STAPLE) IMPLANT
STOCKINETTE IMPERVIOUS 9X36 MD (GAUZE/BANDAGES/DRESSINGS) ×2 IMPLANT
SUT FIBERWIRE #2 38 BLUE 1/2 (SUTURE) ×8
SUT FIBERWIRE #2 38 T-5 BLUE (SUTURE) ×4
SUT PROLENE 4 0 PS 2 18 (SUTURE) ×4 IMPLANT
SUT VIC AB 2-0 CT1 27 (SUTURE) ×4
SUT VIC AB 2-0 CT1 TAPERPNT 27 (SUTURE) ×4 IMPLANT
SUTURE FIBERWR #2 38 BLUE 1/2 (SUTURE) ×4 IMPLANT
SUTURE FIBERWR #2 38 T-5 BLUE (SUTURE) ×2 IMPLANT
SYRINGE 10CC LL (SYRINGE) ×2 IMPLANT

## 2015-08-31 NOTE — Discharge Instructions (Addendum)
Keep dressing dry and intact.  May shower after dressing changed on post-op day #4 (Monday).  Cover staples with Band-Aids after drying off. Apply ice frequently to shoulder. Keep shoulder immobilizer on at all times except may remove for bathing purposes. Follow-up in 10-14 days or as scheduled.  General Anesthesia, Adult, Care After Refer to this sheet in the next few weeks. These instructions provide you with information on caring for yourself after your procedure. Your health care provider may also give you more specific instructions. Your treatment has been planned according to current medical practices, but problems sometimes occur. Call your health care provider if you have any problems or questions after your procedure. WHAT TO EXPECT AFTER THE PROCEDURE After the procedure, it is typical to experience:  Sleepiness.  Nausea and vomiting. HOME CARE INSTRUCTIONS  For the first 24 hours after general anesthesia:  Have a responsible person with you.  Do not drive a car. If you are alone, do not take public transportation.  Do not drink alcohol.  Do not take medicine that has not been prescribed by your health care provider.  Do not sign important papers or make important decisions.  You may resume a normal diet and activities as directed by your health care provider.  Change bandages (dressings) as directed.  If you have questions or problems that seem related to general anesthesia, call the hospital and ask for the anesthetist or anesthesiologist on call. SEEK MEDICAL CARE IF:  You have nausea and vomiting that continue the day after anesthesia.  You develop a rash. SEEK IMMEDIATE MEDICAL CARE IF:   You have difficulty breathing.  You have chest pain.  You have any allergic problems.   This information is not intended to replace advice given to you by your health care provider. Make sure you discuss any questions you have with your health care provider.     Document Released: 11/04/2000 Document Revised: 08/19/2014 Document Reviewed: 11/27/2011 Elsevier Interactive Patient Education [employer].

## 2015-08-31 NOTE — Anesthesia Procedure Notes (Signed)
Procedure Name: Intubation Date/Time: 08/31/2015 7:48 AM Performed by: Rosaria Ferries, Bobie Kistler Pre-anesthesia Checklist: Patient identified, Emergency Drugs available, Suction available and Patient being monitored Patient Re-evaluated:Patient Re-evaluated prior to inductionOxygen Delivery Method: Circle system utilized Preoxygenation: Pre-oxygenation with 100% oxygen Intubation Type: IV induction Laryngoscope Size: Mac and 3 Grade View: Grade I Tube type: Oral Tube size: 6.5 mm Number of attempts: 1 Secured at: 21 cm Tube secured with: Tape Dental Injury: Teeth and Oropharynx as per pre-operative assessment

## 2015-08-31 NOTE — OR Nursing (Signed)
Patient states she still has numbness in right hand following surgery in June 2016.  There is a plate in her chest is between c6 and c7 "donut thing" in place.  Half of thumb removed on right hand previously.  She has a high pain tolerance and is not aware of much discomfort.

## 2015-08-31 NOTE — H&P (Signed)
Paper H&P to be scanned into permanent record. H&P reviewed. No changes. 

## 2015-08-31 NOTE — Transfer of Care (Signed)
Immediate Anesthesia Transfer of Care Note  Patient: Laura Hale  Procedure(s) Performed: Procedure(s): OPEN REDUCTION INTERNAL FIXATION (ORIF) PROXIMAL HUMERUS FRACTURE w/ #2 fberwire (Right)  Patient Location: PACU  Anesthesia Type:General  Level of Consciousness: awake, alert , oriented and patient cooperative  Airway & Oxygen Therapy: Patient Spontanous Breathing and Patient connected to nasal cannula oxygen  Post-op Assessment: Report given to RN and Post -op Vital signs reviewed and stable  Post vital signs: Reviewed and stable  Last Vitals:  Filed Vitals:   08/31/15 0624 08/31/15 1002  BP: 158/81 185/100  Pulse: 80 71  Temp: 36.2 C 37.3 C  Resp: 14 15    Complications: No apparent anesthesia complications

## 2015-08-31 NOTE — Op Note (Signed)
08/31/2015  9:23 AM  Patient:   Laura Hale  Pre-Op Diagnosis:   Closed displaced greater tuberosity fracture with nondisplaced impacted surgical neck fracture, right proximal humerus.  Post-Op Diagnosis:   Same.  Procedure:   Open reduction and internal fixation of displaced greater tuberosity fracture, right proximal humerus.  Surgeon:   Pascal Lux, MD  Assistant:   Cameron Proud, PA-C; Amber Race, PA-S  Anesthesia:   GET  Findings:   As above. The surgical neck fracture was stable and well aligned, so it was not addressed surgically. The rotator cuff itself was intact.  Complications:   None  EBL:   50 cc  Fluids:   800 cc crystalloid  TT:   None  Drains:   None  Closure:   Staples  Implants:   None  Brief Clinical Note:   The patient is a 70 year old female who sustained the above-noted injury 10 days ago when she fell at home. X-rays in the emergency room demonstrated the above-noted fracture. The patient presents at this time for definitive management of this injury.  Procedure:   The patient was brought into the operating room and lain in the supine position. After adequate general endotracheal intubation and anesthesia were obtained, the patient was repositioned in the beach chair position using the beach chair positioner. The right shoulder and upper extremity were prepped with ChloraPrep solution before being draped sterilely. Preoperative antibiotics were administered. After performing a timeout to verify the appropriate surgical site, a total of 20 cc of 0.5% Sensorcaine with epinephrine was injected along the incision site before a standard anterior approach to the shoulder was made through an approximate 4-5 inch incision. The incision was carried down through the subcutaneous tissues to expose the deltopectoral fascia. The interval between the deltoid and pectoralis muscles was developed, retracting the cephalic vein laterally with the deltoid. The conjoined  tendon was identified and retracted medially to access the proximal humerus. The biceps tendon was identified, as was the greater tuberosity fracture itself. The fracture hematoma was debrided and the fracture gently reduced. A #2 fiber wire was placed through the supraspinatus/infraspinatus tendon junction to help control and reduce the fracture fragment. A second #2 FiberWire suture was placed through drill holes in the lateral metaphyseal cortex of the fracture fragment. Once the fracture was reduced, both sets of sutures were brought back through drill holes in the bicipital groove and tied securely. The adequacy of fracture reduction was verified both visually and by C-arm imaging in internal, external, and neutral rotation and found to be excellent. The rotator cuff was carefully assessed and found to be undamaged. The surgical neck fracture also was reassessed and found to be stable and nondisplaced, both by palpation and visually, as well as by fluoroscopic visualization.  The wound was copiously irrigated with sterile saline solution using bulb irrigation before the deltopectoral interval was reapproximated using 2-0 Vicryl interrupted sutures. The subcutaneous tissues also were closed using 2-0 Vicryl interrupted sutures before the skin was closed using staples. A total of 20 cc of Exparel diluted out to 60 cc with normal saline was injected in and around the incision site, as well as in and around the fracture sites to help with postoperative analgesia. A sterile occlusive dressing was applied to the wound before the arm was placed into a shoulder immobilizer with abduction pillow. The patient was then awakened, extubated, and returned to the recovery room in satisfactory condition after tolerating the procedure well.

## 2015-08-31 NOTE — Anesthesia Preprocedure Evaluation (Addendum)
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 whereby patient lost her voice for 10 weeks    GI/Hepatic Neg liver ROS, Colon polyps   Endo/Other  negative endocrine ROS  Renal/GU negative Renal ROS Bladder dysfunction  Urinary incontinence    Musculoskeletal  (+) Arthritis , Osteoarthritis,    Abdominal Normal abdominal exam  (+)   Peds Atypical chidhood psychosis   Hematology negative hematology ROS (+)   Anesthesia Other Findings   Reproductive/Obstetrics                            Anesthesia Physical Anesthesia Plan  ASA: III  Anesthesia Plan: General   Post-op Pain Management:    Induction: Intravenous  Airway Management Planned: Oral ETT  Additional Equipment:   Intra-op Plan:   Post-operative Plan: Extubation in OR  Informed Consent: I have reviewed the patients History and Physical, chart, labs and discussed the procedure including the risks, benefits and alternatives for the proposed anesthesia with the patient or authorized representative who has indicated his/her understanding and acceptance.   Dental advisory given  Plan Discussed with: CRNA and Surgeon  Anesthesia Plan Comments: (I talked with the patient about doing a GOT only with no interscalene block for post  op pain because I am concerned that the 10 week episode of hoarseness after the ACDF surgery may have some nerve compression component to it ( recurrent laryngeal nerve ) and not just do to any type of intubation issue.  There is a chance of aggravating that injury or temporarily causing vocal cord dysfunction in a possibly already compromised nerve.  The patient's voice is still hoarse pre-op.)        Anesthesia Quick Evaluation

## 2015-09-01 NOTE — Anesthesia Postprocedure Evaluation (Signed)
Anesthesia Post Note  Patient: Laura Hale  Procedure(s) Performed: Procedure(s) (LRB): OPEN REDUCTION INTERNAL FIXATION (ORIF) PROXIMAL HUMERUS FRACTURE w/ #2 fberwire (Right)  Patient location during evaluation: PACU Anesthesia Type: General Level of consciousness: awake and alert and oriented Pain management: pain level controlled Vital Signs Assessment: post-procedure vital signs reviewed and stable Respiratory status: spontaneous breathing Cardiovascular status: blood pressure returned to baseline Anesthetic complications: no    Last Vitals:  Filed Vitals:   08/31/15 1112 08/31/15 1152  BP: 152/76 143/75  Pulse: 74 69  Temp:    Resp: 12 14    Last Pain:  Filed Vitals:   09/01/15 0840  PainSc: 0-No pain                 Damontre Millea

## 2015-09-05 DIAGNOSIS — S42224A 2-part nondisplaced fracture of surgical neck of right humerus, initial encounter for closed fracture: Secondary | ICD-10-CM | POA: Diagnosis not present

## 2015-09-11 DIAGNOSIS — S42291A Other displaced fracture of upper end of right humerus, initial encounter for closed fracture: Secondary | ICD-10-CM | POA: Diagnosis not present

## 2015-10-10 DIAGNOSIS — H2513 Age-related nuclear cataract, bilateral: Secondary | ICD-10-CM | POA: Diagnosis not present

## 2015-10-12 ENCOUNTER — Ambulatory Visit: Payer: PPO | Attending: Surgery | Admitting: Physical Therapy

## 2015-10-12 ENCOUNTER — Encounter: Payer: Self-pay | Admitting: Physical Therapy

## 2015-10-12 DIAGNOSIS — R29898 Other symptoms and signs involving the musculoskeletal system: Secondary | ICD-10-CM | POA: Diagnosis not present

## 2015-10-12 DIAGNOSIS — M25511 Pain in right shoulder: Secondary | ICD-10-CM | POA: Diagnosis not present

## 2015-10-12 DIAGNOSIS — G8918 Other acute postprocedural pain: Secondary | ICD-10-CM | POA: Diagnosis not present

## 2015-10-12 NOTE — Therapy (Signed)
[place] [facility] [address], [name], [address] Phone: [phone]   Fax:  [phone]  Physical Therapy Evaluation  Patient Details  Name: Laura Hale MRN: [id number] Date of Birth: February 21, 1946 Referring Provider: Milagros Evener, MD  Encounter Date: 10/12/2015      PT End of Session - 10/12/15 1459    Visit Number 1   Number of Visits 12   Date for PT Re-Evaluation 11/24/15   Authorization Type 1   Authorization Time Period 10 (G Code)   PT Start Time 0900   PT Stop Time 1000   PT Time Calculation (min) 60 min   Activity Tolerance No increased pain;Patient tolerated treatment well   Behavior During Therapy [facility] for tasks assessed/performed      Past Medical History  Diagnosis Date  . Arthritis     elbow, hands, neck   . Atypical childhood psychosis 2007    myoview -negative for ischemia with preserved LV function  . Colon polyps   . Hypercalcemia 2009    calcium  WNL 10.2 on 04-2015  . Hyperlipidemia   . Obesity   . Urinary incontinence   . Asthma     allergy induced asthma-only exacerbated by smells like perfume, smoke etc  . Complication of anesthesia     WITH ACDF IN JUNE 2016-PT STATES SHE LOST HER VOICE X 10 WEEKS DUE TO INTUBATION    Past Surgical History  Procedure Laterality Date  . Elbow surgery Left 04/2003    x2  & for repair & then remove all hardware, due to injury from a fall  . Hand surgery Right 2006    injury- then eventually lost remainder of thumb  . Abdominal hysterectomy    . Anterior cervical decomp/discectomy fusion N/A 01/31/2015    Procedure: ANTERIOR CERVICAL DECOMPRESSION/DISCECTOMY FUSION CERVICAL FIVE SIX;  Surgeon: Karie Chimera, MD;  Location: [place] [facility];  Service: Neurosurgery;  Laterality: N/A;  . Colonoscopy    . 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 Orif humerus fracture Right 08/31/2015    Procedure: OPEN REDUCTION INTERNAL FIXATION (ORIF) PROXIMAL HUMERUS  FRACTURE w/ #2 fberwire;  Surgeon: Corky Mull, MD;  Location: [facility] ORS;  Service: Orthopedics;  Laterality: Right;    There were no vitals filed for this visit.  Visit Diagnosis:  Pain in joint of right shoulder - Plan: PT plan of care cert/re-cert  Pain at surgical site - Plan: PT plan of care cert/re-cert  Shoulder weakness - Plan: PT plan of care cert/re-cert      Subjective Assessment - 10/12/15 1108    Subjective Patient reports increased shoulder pain post ORIF for humerus fx on 08/31/15. States her resting pain today 1/10, at the best 0/10, and 4/10 at its worst (when waking up after lying on the shoulder all night). States increased difficulty with chopping with a knife, driving, lifting things heavier than a plate from an overhead shelf, pushing (when transfering out of bed),  sleeping, and folding laundry. Pt states minimal resting pain stating pain resolves after she stops aggravating motion. Pt states she's also experiencing left shoulder pain.   Pertinent History Patient reports falling on ice 08/19/15 injuring right shoulder and underwent ORIF 08/31/15. States pain has been improving since the surgery. 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    Limitations House hold activities;Lifting;Other (comment)  Chopping, reaching overhead.   Diagnostic tests X-RAY: Greater tubercle fx -- ORIF performed on 08/31/15.   Patient Stated Goals Would like to be able to drive and use R shoulder without pain.    Currently in Pain? Yes   Pain Score 1    Pain Location Shoulder   Pain Orientation Right   Pain Descriptors / Indicators Aching   Pain Type Surgical pain;Acute pain   Pain Onset More than a month ago   Pain Frequency Intermittent   Aggravating Factors  States  increased difficulty with chopping with a knife, driving, lifting things heavier than a plate from an overhead shelf, pushing (when transfering out of bed),  sleeping, and folding laundry   Pain Relieving Factors Inactivity   Effect of Pain on Daily Activities Able to perform ADLs with pain.            [facility] PT Assessment - 10/12/15 0911    Assessment   Medical Diagnosis Closed 2-part nondisplaced fracture of surgical neck of right humerus, initial encounter [id number])   Referring Provider Milagros Evener, MD   Onset Date/Surgical Date 08/31/15  Date of surgery   Hand Dominance Right   Next MD Visit 12/04/15   Prior Therapy no   Balance Screen   Has the patient fallen in the past 6 months No   Has the patient had a decrease in activity level because of a fear of falling?  No   Is the patient reluctant to leave their home because of a fear of falling?  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 Inferior angle of the scapula at equal bilaterally Palpation: Tender to palpation with increased muscle guarding/spasms: supraspinatus, infraspinatus, teres minor, and lower trapezius right  AROM: Shoulder: Flexion (in sitting): 135 degrees, ER/IR: 50 degrees; Cervical: flex: 45, ext: 30, right SB: 30, left SB: 40, Left rotation: 60 -- increased pain along upper trap , right rotation: 75  Objective Measures: NDI: 24% self perceived impairment (moderate: in need of intervention), QuickDASH: 51% self perceived impairment (in need of intervention)  Hand-grip dynamometry: R: 35lbs, 40lbs, L:45lbs, 45lbs   Therapeutic Exercise:  Patient performed exercises with guidance, verbal and tactile cues and demonstration of therapist: Scapular retractions in sitting x 10 AAROM shoulder flexion in  supine  x10 Shoulder ER in sidelying x10 Serratus Punches in supine x10   Response to Treatment: Required increased tactile and verbal cueing to perform exercises and had decreased resting pain following exercises, improved technique with repetition.            PT Education - 10/12/15 1455    Education provided Yes   Education Details HEP: Handout given: scapular retraction, AAROM shoulder flexion in supine, serratus punches, sidelying external rotation. Educated on transfering from supine to sitting   Person(s) Educated Patient   Methods Handout;Explanation;Demonstration   Comprehension Verbalized understanding             PT Long Term Goals - 10/12/15 1521    PT LONG TERM GOAL #1   Title Patient will present with an NDI score of <14% impaired by 11/24/15 to show significant improvement with neck function to improve head turning for driving.   Baseline 24%  impaired   Status New   PT LONG TERM GOAL #2   Title Patient will present with an QuickDASH score of <20% impaired by 11/24/15 to show significant improvement with shoulder function to improve ability to chop food while cooking.   Baseline 51% Impaired   Status New   PT LONG TERM GOAL #3   Title Patient will be independent with HEP focusing on increasing muscular endurance, coordination, and strength by 11/24/15 to progress tissue healing after therapy is complete.   Baseline Limited knowledge of pain control and appropriate progression of exercises   Status New               Plan - 10/13/2015 1500    Clinical Impression Statement Patient is a 70 yo right hand dominant female who presents with right shoulder pain s/p ORIF on 08/31/2015 following a fall on I/02/2016.  Patient presents with an NDI score of 24% (moderate self perceived impairment) and a QuickDASH of 51% impairment, decreased right shoulder AROM, strength, endurance, and coordination indicating significant shoulder dysfunction.  Presents with increased  muscle spasms and guarding in the upper traps, infraspinatus, supraspinatus, and lower traps indicating necessity for skilled therapy intervention to improve functional use of right UE for daily tasks and return patient  to prior level of function.    Pt will benefit from skilled therapeutic intervention in order to improve on the following deficits Decreased range of motion;Increased fascial restricitons;Impaired UE functional use;Increased muscle spasms;Decreased endurance;Decreased activity tolerance;Pain;Impaired flexibility;Hypomobility;Decreased strength;Impaired sensation   Rehab Potential Good   Clinical Impairments Affecting Rehab Potential (+) highly motivated, acute condition, active prior level of function; (-) previous discectomy affecting right side, Increase shoulder pain on the left side.    PT Frequency 2x / week   PT Duration 6 weeks   PT Treatment/Interventions Passive range of motion;Neuromuscular re-education;Manual techniques;ADLs/Self Care Home Management;Ultrasound;Electrical Stimulation;Iontophoresis 4mg /ml Dexamethasone;Moist Heat;Therapeutic exercise;Therapeutic activities;Patient/family education   PT Next Visit Plan Advance scapular stabilization and ROM/strengthening exercises, pain control   PT Home Exercise Plan Scapular retractions, AAROM shoulder flexion in supine, sidelying shoulder ER, and serratus punches   Consulted and Agree with Plan of Care Patient          G-Codes - 10/13/15 1007    Functional Assessment Tool Used QuickDash, NDI, ROM, strength, pain scale, clinical judgment   Functional Limitation Carrying, moving and handling objects   Carrying, Moving and Handling Objects Current Status [phone]) At least 40 percent but less than 60 percent impaired, limited or restricted   Carrying, Moving and Handling Objects Goal Status [id number]) At least 1 percent but less than 20 percent impaired, limited or restricted       Problem List Patient Active Problem  List   Diagnosis Date Noted  . 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    Blythe Stanford, SPT 10/13/2015, 10:19 AM  [place] [facility] [address] [name], [address] Phone: [phone]   Fax:  [phone]  Name: VIRGEN KROCKER MRN: [id number] Date of Birth: 10/22/1945

## 2015-10-13 DIAGNOSIS — E559 Vitamin D deficiency, unspecified: Secondary | ICD-10-CM | POA: Diagnosis not present

## 2015-10-13 DIAGNOSIS — R739 Hyperglycemia, unspecified: Secondary | ICD-10-CM | POA: Diagnosis not present

## 2015-10-13 DIAGNOSIS — I10 Essential (primary) hypertension: Secondary | ICD-10-CM | POA: Diagnosis not present

## 2015-10-13 DIAGNOSIS — G629 Polyneuropathy, unspecified: Secondary | ICD-10-CM | POA: Diagnosis not present

## 2015-10-13 DIAGNOSIS — K219 Gastro-esophageal reflux disease without esophagitis: Secondary | ICD-10-CM | POA: Diagnosis not present

## 2015-10-17 ENCOUNTER — Ambulatory Visit: Payer: PPO | Admitting: Physical Therapy

## 2015-10-17 ENCOUNTER — Encounter: Payer: Self-pay | Admitting: Physical Therapy

## 2015-10-17 DIAGNOSIS — M25511 Pain in right shoulder: Secondary | ICD-10-CM

## 2015-10-17 DIAGNOSIS — R29898 Other symptoms and signs involving the musculoskeletal system: Secondary | ICD-10-CM

## 2015-10-17 DIAGNOSIS — G8918 Other acute postprocedural pain: Secondary | ICD-10-CM

## 2015-10-17 NOTE — Therapy (Signed)
[place] [facility] [address], [name], [address] Phone: [phone]   Fax:  [phone]  Physical Therapy Treatment  Patient Details  Name: Laura Hale MRN: [id number] Date of Birth: 06-Dec-1945 Referring Provider: Milagros Evener, MD  Encounter Date: 10/17/2015      PT End of Session - 10/17/15 1700    Visit Number 2   Number of Visits 12   Date for PT Re-Evaluation 11/24/15   Authorization Type 2   Authorization Time Period 10 (G Code)   PT Start Time 1610   PT Stop Time 1655   PT Time Calculation (min) 45 min   Activity Tolerance No increased pain;Patient tolerated treatment well   Behavior During Therapy [facility] for tasks assessed/performed      Past Medical History  Diagnosis Date  . Arthritis     elbow, hands, neck   . Atypical childhood psychosis 2007    myoview -negative for ischemia with preserved LV function  . Colon polyps   . Hypercalcemia 2009    calcium  WNL 10.2 on 04-2015  . Hyperlipidemia   . Obesity   . Urinary incontinence   . Asthma     allergy induced asthma-only exacerbated by smells like perfume, smoke etc  . Complication of anesthesia     WITH ACDF IN JUNE 2016-PT STATES SHE LOST HER VOICE X 10 WEEKS DUE TO INTUBATION    Past Surgical History  Procedure Laterality Date  . Elbow surgery Left 04/2003    x2  & for repair & then remove all hardware, due to injury from a fall  . Hand surgery Right 2006    injury- then eventually lost remainder of thumb  . Abdominal hysterectomy    . Anterior cervical decomp/discectomy fusion N/A 01/31/2015    Procedure: ANTERIOR CERVICAL DECOMPRESSION/DISCECTOMY FUSION CERVICAL FIVE SIX;  Surgeon: Karie Chimera, MD;  Location: [place] [facility];  Service: Neurosurgery;  Laterality: N/A;  . Colonoscopy    . 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 Orif humerus fracture Right 08/31/2015    Procedure: OPEN REDUCTION INTERNAL FIXATION (ORIF) PROXIMAL HUMERUS  FRACTURE w/ #2 fberwire;  Surgeon: Corky Mull, MD;  Location: [facility] ORS;  Service: Orthopedics;  Laterality: Right;    There were no vitals filed for this visit.  Visit Diagnosis:  Pain in joint of right shoulder  Pain at surgical site  Shoulder weakness      Subjective Assessment - 10/17/15 1611    Subjective Patient reports no pain in the shoulder today and states some minor tightness along the upper trap. Reports minor discomfort through the mid thoracic around the scapula.   Pertinent History Patient reports history of falling on ice 08/19/15, injuring right shoulder and underwent ORIF  08/31/15. 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     Patient Stated Goals Would like to be able to drive and use R shoulder without pain.    Currently in Pain? No/denies        Objective:   Observation: Sitting posture: Increased forward head posture. Palpation: Tender to palpation with increased muscle guarding/spasms on right: supraspinatus, infraspinatus, teres minor, and lower trapezius, scapular retractors, serratus anterior, AROM: Shoulder: AAROM Flexion in supine : 135 degrees,  ER/IR: 50 degrees    Therapeutic Exercise:  Patient performed exercises with guidance, verbal and tactile cues and demonstration of therapist:  Scapular retractions in sitting-- x 15 AAROM shoulder flexion in supine-- x10 UE ranger on floor in standing -- x10 forward/back, IR/ER with towel under elbow UE ranger on the wall in standing -- x15 overhead flexion Shoulder ER in sidelying--  x10 Serratus Punches in supine-- 2x10  Manual Therapy: STM using superficial techniques to the teres musculature, scapular retractors, supraspinatus and  upper trap to decrease muscular spasms and guarding. Sidelying : upper trap stretch on R -- 3 x 15sec; Assisted scapular elevation/depression on R --x15; assisted downward rotation on R --x15   Response to Treatment: Decreased motor control with scapular retraction demonstrating increased upper trap activation and required tactile and verbal cueing to engage lower/mid traps and rhomboids to correct. Spasms decreased 33% after STM.        PT Education - 10/17/15 1658    Education provided Yes   Education Details HEP: Serratus punches,adapting UE ranger with wash cloth on a high counter.    Person(s) Educated Patient   Methods Explanation;Demonstration   Comprehension Verbalized understanding;Returned demonstration             PT Long Term Goals - 10/12/15 1521    PT LONG TERM GOAL #1   Title Patient will present with an NDI score of <14% impaired by 11/24/15 to show significant improvement with neck function to improve head turning for driving.   Baseline 24% impaired   Status New   PT LONG TERM GOAL #2   Title Patient will present with an QuickDASH score of <20% impaired by 11/24/15 to show significant improvement with shoulder function to improve ability to chop food while cooking.   Baseline 51% Impaired   Status New   PT LONG TERM GOAL #3   Title Patient will be independent with HEP focusing on increasing muscular endurance, coordination, and strength by 11/24/15 to progress tissue healing after therapy is complete.   Baseline limited knowledge of pain control and appropriate progression of exercises   Status New               Plan - 10/17/15 1802    Clinical Impression Statement Patient is making progress towards long term goals demonstrating improved ability to raise arm overhead today indicating functional carryover between therapy visits. Focused on AROM exercises today exercises would add excessive loading to the shoulder. Patient with limitations in shoulder  muscular endurance, AROM, and motor control  and will benefit from further skilled therapy to return to prior level of function.    Pt will benefit from skilled therapeutic intervention in order to improve on the following deficits Decreased range of motion;Increased fascial restricitons;Impaired UE functional use;Increased muscle spasms;Decreased endurance;Decreased activity tolerance;Pain;Impaired flexibility;Hypomobility;Decreased strength;Impaired sensation   Rehab Potential Good   Clinical Impairments Affecting Rehab Potential (+) highly motivated, acute condition, active prior level of function; (-) previous discectomy affecting right side, Increase shoulder pain on the left side.    PT Frequency 2x / week   PT Duration 6 weeks   PT Treatment/Interventions Passive range of motion;Neuromuscular re-education;Manual techniques;ADLs/Self Care Home Management;Ultrasound;Electrical Stimulation;Iontophoresis 4mg /ml Dexamethasone;Moist Heat;Therapeutic exercise;Therapeutic activities;Patient/family education   PT Next Visit Plan Advance scapular stabilization and ROM/strengthening exercises, pain control   PT Home Exercise Plan Scapular retractions, AAROM shoulder flexion in supine, sidelying shoulder ER, and serratus punches   Consulted and Agree with Plan of Care Patient  Problem List Patient Active Problem List   Diagnosis Date Noted  . 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    Blythe Stanford, SPT 10/18/2015, 4:27 PM  [place] [facility] [address] [name], [address] Phone: [phone]   Fax:  [phone]  Name: Laura Hale MRN: [id number] Date of Birth: 05-14-1946

## 2015-10-19 ENCOUNTER — Encounter: Payer: Self-pay | Admitting: Physical Therapy

## 2015-10-19 ENCOUNTER — Ambulatory Visit: Payer: PPO | Admitting: Physical Therapy

## 2015-10-19 DIAGNOSIS — G8918 Other acute postprocedural pain: Secondary | ICD-10-CM

## 2015-10-19 DIAGNOSIS — K219 Gastro-esophageal reflux disease without esophagitis: Secondary | ICD-10-CM | POA: Diagnosis not present

## 2015-10-19 DIAGNOSIS — M25511 Pain in right shoulder: Secondary | ICD-10-CM

## 2015-10-19 DIAGNOSIS — I10 Essential (primary) hypertension: Secondary | ICD-10-CM | POA: Insufficient documentation

## 2015-10-19 DIAGNOSIS — R29898 Other symptoms and signs involving the musculoskeletal system: Secondary | ICD-10-CM

## 2015-10-19 DIAGNOSIS — R739 Hyperglycemia, unspecified: Secondary | ICD-10-CM | POA: Diagnosis not present

## 2015-10-19 DIAGNOSIS — J45909 Unspecified asthma, uncomplicated: Secondary | ICD-10-CM | POA: Diagnosis not present

## 2015-10-19 DIAGNOSIS — G629 Polyneuropathy, unspecified: Secondary | ICD-10-CM | POA: Diagnosis not present

## 2015-10-19 DIAGNOSIS — E559 Vitamin D deficiency, unspecified: Secondary | ICD-10-CM | POA: Diagnosis not present

## 2015-10-19 DIAGNOSIS — E784 Other hyperlipidemia: Secondary | ICD-10-CM | POA: Diagnosis not present

## 2015-10-19 DIAGNOSIS — Z6839 Body mass index (BMI) 39.0-39.9, adult: Secondary | ICD-10-CM | POA: Diagnosis not present

## 2015-10-19 DIAGNOSIS — J449 Chronic obstructive pulmonary disease, unspecified: Secondary | ICD-10-CM | POA: Diagnosis not present

## 2015-10-19 DIAGNOSIS — M1990 Unspecified osteoarthritis, unspecified site: Secondary | ICD-10-CM | POA: Diagnosis not present

## 2015-10-19 NOTE — Therapy (Signed)
[facility] [address], [name], [address] Phone: [phone]   Fax:  [phone]  Physical Therapy Treatment  Patient Details  Name: Laura Hale MRN: [id number] Date of Birth: Sep 11, 1945 Referring Provider: Milagros Evener, MD  Encounter Date: 10/19/2015      PT End of Session - 10/19/15 1804    Visit Number 3   Number of Visits 12   Date for PT Re-Evaluation 11/24/15   Authorization Type 3   Authorization Time Period 10 (G Code)   PT Start Time [id number]   PT Stop Time [id number]   PT Time Calculation (min) 33 min   Activity Tolerance No increased pain;Patient tolerated treatment well   Behavior During Therapy [facility] for tasks assessed/performed      Past Medical History  Diagnosis Date  . Arthritis     elbow, hands, neck   . Atypical childhood psychosis 2007    myoview -negative for ischemia with preserved LV function  . Colon polyps   . Hypercalcemia 2009    calcium  WNL 10.2 on 04-2015  . Hyperlipidemia   . Obesity   . Urinary incontinence   . Asthma     allergy induced asthma-only exacerbated by smells like perfume, smoke etc  . Complication of anesthesia     WITH ACDF IN JUNE 2016-PT STATES SHE LOST HER VOICE X 10 WEEKS DUE TO INTUBATION    Past Surgical History  Procedure Laterality Date  . Elbow surgery Left 04/2003    x2  & for repair & then remove all hardware, due to injury from a fall  . Hand surgery Right 2006    injury- then eventually lost remainder of thumb  . Abdominal hysterectomy    . Anterior cervical decomp/discectomy fusion N/A 01/31/2015    Procedure: ANTERIOR CERVICAL DECOMPRESSION/DISCECTOMY FUSION CERVICAL FIVE SIX;  Surgeon: Karie Chimera, MD;  Location: [place] [facility];  Service: Neurosurgery;  Laterality: N/A;  . Colonoscopy    . 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 Orif humerus fracture Right 08/31/2015    Procedure: OPEN REDUCTION INTERNAL FIXATION (ORIF) PROXIMAL HUMERUS  FRACTURE w/ #2 fberwire;  Surgeon: Corky Mull, MD;  Location: [facility];  Service: Orthopedics;  Laterality: Right;    There were no vitals filed for this visit.  Visit Diagnosis:  Pain in joint of right shoulder  Pain at surgical site  Shoulder weakness      Subjective Assessment - 10/19/15 1620    Subjective Patient reports no pain in the shoulder today. Mentions right knee is in pain and is afraid of falling.    Limitations House hold activities;Lifting;Other (comment)   Patient Stated Goals Would like to be able to drive and use R shoulder without pain.    Currently in Pain? No/denies      Objective:   Observation: Sitting posture: Increased forward head posture. Palpation: Tender to palpation with increased muscle guarding/spasms on right: supraspinatus, infraspinatus, teres minor, and lower trapezius, scapular retractors, serratus anterior, AROM: Shoulder: AROM Flexion in sitting : 145 degrees, abd 135  ER: 50 degrees, behind the back shoulder internal ROM: L5 level   Therapeutic Exercise:  Patient performed exercises with guidance, verbal and tactile cus and demonstration of therapist:  Scapular retractions in sitting-- x 15 AAROM shoulder flexion in supine-- x15 UE ranger on floor in standing -- x10 forward/back, IR/ER with towel under elbow UE ranger on the wall in standing -- x15  overhead flexion, arm circles clockwise/counterclockwise Scapular retractions in supine -- x10 Serratus Punches in supine-- 2x10  Manual Therapy: STM using superficial and deep techniques to the teres musculature, scapular retractors, and upper trap to decrease muscular spasms and guarding.    Response to Treatment: Good demonstration with performance of UE ranger today requiring minimal cueing to decrease upper trap activation to perform. Increase in shoulder internal AROM from hip level to L5 level after therapeutic exercise and manual therapy indicating improved tissue elasticity and  improved shoulder flexibility with exercises         PT Education - 10/19/15 1802    Education provided Yes   Education Details HEP: Arm circles against wall with was cloth, educated to place towel under arm to increase activation of external rotators    Person(s) Educated Patient   Methods Explanation;Demonstration   Comprehension Verbalized understanding;Returned demonstration             PT Long Term Goals - 10/12/15 1521    PT LONG TERM GOAL #1   Title Patient will present with an NDI score of <14% impaired by 11/24/15 to show significant improvement with neck function to improve head turning for driving.   Baseline 24% impaired   Status New   PT LONG TERM GOAL #2   Title Patient will present with an QuickDASH score of <20% impaired by 11/24/15 to show significant improvement with shoulder function to improve ability to chop food while cooking.   Baseline 51% Impaired   Status New   PT LONG TERM GOAL #3   Title Patient will be independent with HEP focusing on increasing muscular endurance, coordination, and strength by 11/24/15 to progress tissue healing after therapy is complete.   Baseline limited knowledge of pain control and appropriate progression of exercises   Status New               Plan - 10/19/15 1805    Clinical Impression Statement Patient is making progress toward long term goals with significant improvement in AROM shoulder flexion and abduction in sitting. Continued to focus on AROM exercises as resistive exercises would add excessive load to the shoulder. Pt will benefit from further skilled therapy to return to prior level of function.   Pt will benefit from skilled therapeutic intervention in order to improve on the following deficits Decreased range of motion;Increased fascial restricitons;Impaired UE functional use;Increased muscle spasms;Decreased endurance;Decreased activity tolerance;Pain;Impaired flexibility;Hypomobility;Decreased  strength;Impaired sensation   Rehab Potential Good   Clinical Impairments Affecting Rehab Potential (+) highly motivated, acute condition, active prior level of function; (-) previous discectomy affecting right side, Increase shoulder pain on the left side.    PT Frequency 2x / week   PT Duration 6 weeks   PT Treatment/Interventions Passive range of motion;Neuromuscular re-education;Manual techniques;ADLs/Self Care Home Management;Ultrasound;Electrical Stimulation;Iontophoresis 4mg /ml Dexamethasone;Moist Heat;Therapeutic exercise;Therapeutic activities;Patient/family education   PT Next Visit Plan Advance scapular stabilization and ROM/strengthening exercises, pain control   PT Home Exercise Plan Scapular retractions, AAROM shoulder flexion in supine, sidelying shoulder ER, and serratus punches   Consulted and Agree with Plan of Care Patient        Problem List Patient Active Problem List   Diagnosis Date Noted  . 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    Blythe Stanford, SPT 10/19/2015, 6:10 PM  [facility] [address], [name], [address] Phone: [phone]   Fax:  [phone]  Name: Laura Hale MRN: [id number] Date of Birth:  08/23/1945    

## 2015-10-23 ENCOUNTER — Other Ambulatory Visit: Payer: Self-pay | Admitting: Internal Medicine

## 2015-10-23 DIAGNOSIS — M25561 Pain in right knee: Principal | ICD-10-CM

## 2015-10-23 DIAGNOSIS — G8929 Other chronic pain: Secondary | ICD-10-CM

## 2015-10-24 ENCOUNTER — Encounter: Payer: Self-pay | Admitting: Physical Therapy

## 2015-10-24 ENCOUNTER — Ambulatory Visit: Payer: PPO | Admitting: Physical Therapy

## 2015-10-24 DIAGNOSIS — M25511 Pain in right shoulder: Secondary | ICD-10-CM | POA: Diagnosis not present

## 2015-10-24 DIAGNOSIS — R29898 Other symptoms and signs involving the musculoskeletal system: Secondary | ICD-10-CM

## 2015-10-24 DIAGNOSIS — G8918 Other acute postprocedural pain: Secondary | ICD-10-CM

## 2015-10-24 NOTE — Therapy (Signed)
[facility] [address] [name], [address] Phone: [phone]   Fax:  [phone]  Physical Therapy Treatment  Patient Details  Name: Laura Hale MRN: [id number] Date of Birth: 27-Jun-1946 Referring Provider: Milagros Evener, MD  Encounter Date: 10/24/2015      PT End of Session - 10/24/15 1410    Visit Number 4   Number of Visits 12   Date for PT Re-Evaluation 11/24/15   Authorization Type 4   Authorization Time Period 10 (G Code)   PT Start Time 0932   PT Stop Time 1012   PT Time Calculation (min) 40 min   Activity Tolerance No increased pain;Patient tolerated treatment well   Behavior During Therapy [facility] for tasks assessed/performed      Past Medical History  Diagnosis Date  . Arthritis     elbow, hands, neck   . Atypical childhood psychosis 2007    myoview -negative for ischemia with preserved LV function  . Colon polyps   . Hypercalcemia 2009    calcium  WNL 10.2 on 04-2015  . Hyperlipidemia   . Obesity   . Urinary incontinence   . Asthma     allergy induced asthma-only exacerbated by smells like perfume, smoke etc  . Complication of anesthesia     WITH ACDF IN JUNE 2016-PT STATES SHE LOST HER VOICE X 10 WEEKS DUE TO INTUBATION    Past Surgical History  Procedure Laterality Date  . Elbow surgery Left 04/2003    x2  & for repair & then remove all hardware, due to injury from a fall  . Hand surgery Right 2006    injury- then eventually lost remainder of thumb  . Abdominal hysterectomy    . Anterior cervical decomp/discectomy fusion N/A 01/31/2015    Procedure: ANTERIOR CERVICAL DECOMPRESSION/DISCECTOMY FUSION CERVICAL FIVE SIX;  Surgeon: Karie Chimera, MD;  Location: [place] [facility];  Service: Neurosurgery;  Laterality: N/A;  . Colonoscopy    . 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 Orif humerus fracture Right 08/31/2015    Procedure: OPEN REDUCTION INTERNAL FIXATION (ORIF) PROXIMAL HUMERUS  FRACTURE w/ #2 fberwire;  Surgeon: Corky Mull, MD;  Location: [facility];  Service: Orthopedics;  Laterality: Right;    There were no vitals filed for this visit.  Visit Diagnosis:  Shoulder weakness  Pain at surgical site  Pain in joint of right shoulder      Subjective Assessment - 10/24/15 0935    Subjective Patient reports no pain in the shoulder today and states shes able to move the shoulder through increased ROM. Reports ability to sleep on the right shoulder at night.   Limitations House hold activities;Lifting;Other (comment)   Patient Stated Goals Would like to be able to drive and use R shoulder without pain.    Currently in Pain? No/denies        Objective:   Observation: Sitting posture: Increased forward head posture. Palpation: Tender to palpation with increased muscle guarding/spasms on right: supraspinatus, infraspinatus, teres minor, scapular retractors AROM: Shoulder: AROM Flexion in sitting : 155 degrees, abd 140 ER: 55 degrees, behind the back shoulder internal ROM: L3 level   Therapeutic Exercise:  Patient performed exercises with guidance, verbal and tactile cues and demonstration of therapist:  Scapular retractions in sitting-- x 15 yellow band Rthymic stabilization in supine -- 30sec x 2  AAROM shoulder flexion in supine-- x15 Isometrics 4-way -- 5 sec x 5 (  resisted ER/IR, flexion, ext against wall with towel) Serratus Punches in supine-- x10  Manual Therapy: STM using superficial and deep techniques to the teres musculature, scapular retractors, and upper trap to decrease muscular spasms and guarding.    Response to Treatment: Good demonstration of exercise requiring minimal cueing for shoulder position when performing 4-way shoulder isometrics. Increased fatigue after performing resistance exercise today indicating decreased muscular endurance.        PT Education - 10/24/15 1408    Education Details HEP: serratus punches, overhead  shoulder flexion with 2#, scapular retraction with yellow band, 4-way shoulder isometrics   Person(s) Educated Patient   Methods Explanation;Demonstration   Comprehension Returned demonstration;Verbalized understanding             PT Long Term Goals - 10/12/15 1521    PT LONG TERM GOAL #1   Title Patient will present with an NDI score of <14% impaired by 11/24/15 to show significant improvement with neck function to improve head turning for driving.   Baseline 24% impaired   Status New   PT LONG TERM GOAL #2   Title Patient will present with an QuickDASH score of <20% impaired by 11/24/15 to show significant improvement with shoulder function to improve ability to chop food while cooking.   Baseline 51% Impaired   Status New   PT LONG TERM GOAL #3   Title Patient will be independent with HEP focusing on increasing muscular endurance, coordination, and strength by 11/24/15 to progress tissue healing after therapy is complete.   Baseline limited knowledge of pain control and appropriate progression of exercises   Status New               Plan - 10/24/15 1455    Clinical Impression Statement Patient making progress towards long term goals demonstrating improvement in shoulder abd/flexion AROM indicating functional carryover between visits. Introduced Market researcher today as per protocol to improve shoulder muscular strength and pt will benefit from further skilled therapy to return to prior level of function.    Pt will benefit from skilled therapeutic intervention in order to improve on the following deficits Decreased range of motion;Increased fascial restricitons;Impaired UE functional use;Increased muscle spasms;Decreased endurance;Decreased activity tolerance;Pain;Impaired flexibility;Hypomobility;Decreased strength;Impaired sensation   Rehab Potential Good   Clinical Impairments Affecting Rehab Potential (+) highly motivated, acute condition, active prior level of  function; (-) previous discectomy affecting right side, Increase shoulder pain on the left side.    PT Frequency 2x / week   PT Duration 6 weeks   PT Treatment/Interventions Passive range of motion;Neuromuscular re-education;Manual techniques;ADLs/Self Care Home Management;Ultrasound;Electrical Stimulation;Iontophoresis 4mg /ml Dexamethasone;Moist Heat;Therapeutic exercise;Therapeutic activities;Patient/family education   PT Next Visit Plan Advance scapular stabilization and ROM/strengthening exercises, pain control   PT Home Exercise Plan Scapular retractions, AAROM shoulder flexion in supine, sidelying shoulder ER,  serratus punches, serratus punches, weighted shoulder flexion.    Consulted and Agree with Plan of Care Patient        Problem List Patient Active Problem List   Diagnosis Date Noted  . 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    Blythe Stanford, SPT 10/24/2015, 3:07 PM  [facility] [address] [name], [address] Phone: [phone]   Fax:  [phone]  Name: KACIA STREIFEL MRN: [id number] Date of Birth: March 01, 1946

## 2015-10-26 ENCOUNTER — Encounter: Payer: Self-pay | Admitting: Physical Therapy

## 2015-10-26 ENCOUNTER — Other Ambulatory Visit: Payer: Self-pay | Admitting: Obstetrics and Gynecology

## 2015-10-26 ENCOUNTER — Ambulatory Visit: Payer: PPO | Admitting: Physical Therapy

## 2015-10-26 ENCOUNTER — Encounter: Payer: PPO | Admitting: Physical Therapy

## 2015-10-26 DIAGNOSIS — M25511 Pain in right shoulder: Secondary | ICD-10-CM

## 2015-10-26 DIAGNOSIS — Z124 Encounter for screening for malignant neoplasm of cervix: Secondary | ICD-10-CM | POA: Diagnosis not present

## 2015-10-26 DIAGNOSIS — Z1231 Encounter for screening mammogram for malignant neoplasm of breast: Secondary | ICD-10-CM

## 2015-10-26 DIAGNOSIS — R29898 Other symptoms and signs involving the musculoskeletal system: Secondary | ICD-10-CM

## 2015-10-26 DIAGNOSIS — Z01419 Encounter for gynecological examination (general) (routine) without abnormal findings: Secondary | ICD-10-CM | POA: Diagnosis not present

## 2015-10-26 DIAGNOSIS — G8918 Other acute postprocedural pain: Secondary | ICD-10-CM

## 2015-10-26 NOTE — Therapy (Signed)
[facility] [address] [name], [address] Phone: [phone]   Fax:  [phone]  Physical Therapy Treatment  Patient Details  Name: Laura Hale MRN: [id number] Date of Birth: 05-31-1946 Referring Provider: Milagros Evener, MD  Encounter Date: 10/26/2015      PT End of Session - 10/26/15 1633    Visit Number 5   Number of Visits 12   Date for PT Re-Evaluation 11/24/15   Authorization Type 5   Authorization Time Period 10 (G Code)   PT Start Time 1535   PT Stop Time [id number]   PT Time Calculation (min) 38 min   Activity Tolerance No increased pain;Patient tolerated treatment well   Behavior During Therapy [facility] for tasks assessed/performed      Past Medical History  Diagnosis Date  . Arthritis     elbow, hands, neck   . Atypical childhood psychosis 2007    myoview -negative for ischemia with preserved LV function  . Colon polyps   . Hypercalcemia 2009    calcium  WNL 10.2 on 04-2015  . Hyperlipidemia   . Obesity   . Urinary incontinence   . Asthma     allergy induced asthma-only exacerbated by smells like perfume, smoke etc  . Complication of anesthesia     WITH ACDF IN JUNE 2016-PT STATES SHE LOST HER VOICE X 10 WEEKS DUE TO INTUBATION    Past Surgical History  Procedure Laterality Date  . Elbow surgery Left 04/2003    x2  & for repair & then remove all hardware, due to injury from a fall  . Hand surgery Right 2006    injury- then eventually lost remainder of thumb  . Abdominal hysterectomy    . Anterior cervical decomp/discectomy fusion N/A 01/31/2015    Procedure: ANTERIOR CERVICAL DECOMPRESSION/DISCECTOMY FUSION CERVICAL FIVE SIX;  Surgeon: Karie Chimera, MD;  Location: [place] [facility];  Service: Neurosurgery;  Laterality: N/A;  . Colonoscopy    . 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 Orif humerus fracture Right 08/31/2015    Procedure: OPEN REDUCTION INTERNAL FIXATION (ORIF) PROXIMAL HUMERUS  FRACTURE w/ #2 fberwire;  Surgeon: Corky Mull, MD;  Location: [facility];  Service: Orthopedics;  Laterality: Right;    There were no vitals filed for this visit.  Visit Diagnosis:  Shoulder weakness  Pain at surgical site  Pain in joint of right shoulder      Subjective Assessment - 10/26/15 1539    Subjective Patient reports increased achiness in the shoulder today after food preparation for a wine class she hosted the previous night.    Limitations House hold activities;Lifting;Other (comment)   Patient Stated Goals Would like to be able to drive and use R shoulder without pain.    Pain Score 3    Pain Location Shoulder   Pain Orientation Right   Pain Descriptors / Indicators Aching   Pain Type Acute pain;Surgical pain   Pain Onset More than a month ago         Objective:   Observation: Sitting posture: Increased forward head posture. Palpation: Tender to palpation with increased muscle guarding/spasms on right: supraspinatus, infraspinatus,and  teres minor. AROM: Shoulder: AROM Flexion in sitting : 155 degrees, abd 140 ER: 55 degrees, behind the back shoulder internal ROM: L3 level   Therapeutic Exercise:  Patient performed exercises with guidance, verbal and tactile cues and demonstration of therapist:  Scapular retractions in sitting-- x  15 yellow band Shoulder extension to hip -- x 15 yellow band UE ranger shoulder flexion -- 2 x 10 UE ranger arm circles with 2# weight -- x10 clockwise/counterclockwise UE ranger internal/external ROM -- 2 x 12 Isometrics 4-way -- 5 sec x 5 (resisted ER/IR, flexion, ext against wall with towel) performed in siting position due to knee pain   Manual Therapy: STM using superficial and deep techniques to the teres musculature, infraspinatus, and upper trap to decrease muscular spasms and guarding.  Patient response to treatment: Spasms decrease by 50 % after performing STM indicating improved tissue elasticity. Good  demonstration of exercise requiring minimal tactile/verbal cueing when performing isometric exercise against the wall. Decreased pain in shoulder after performing exercises and manual therapy today.         PT Long Term Goals - 10/12/15 1521    PT LONG TERM GOAL #1   Title Patient will present with an NDI score of <14% impaired by 11/24/15 to show significant improvement with neck function to improve head turning for driving.   Baseline 24% impaired   Status New   PT LONG TERM GOAL #2   Title Patient will present with an QuickDASH score of <20% impaired by 11/24/15 to show significant improvement with shoulder function to improve ability to chop food while cooking.   Baseline 51% Impaired   Status New   PT LONG TERM GOAL #3   Title Patient will be independent with HEP focusing on increasing muscular endurance, coordination, and strength by 11/24/15 to progress tissue healing after therapy is complete.   Baseline limited knowledge of pain control and appropriate progression of exercises   Status New               Plan - 10/26/15 1640    Clinical Impression Statement Patient making progress towards long term goals demonstrating improved motor control and ROM during exercises. Tolerates increase in resistance exercise today without increase in symptoms and patient will benefit from further skilled therapy to return to prior level of function.    Pt will benefit from skilled therapeutic intervention in order to improve on the following deficits Decreased range of motion;Increased fascial restricitons;Impaired UE functional use;Increased muscle spasms;Decreased endurance;Decreased activity tolerance;Pain;Impaired flexibility;Hypomobility;Decreased strength;Impaired sensation   Rehab Potential Good   Clinical Impairments Affecting Rehab Potential (+) highly motivated, acute condition, active prior level of function; (-) previous discectomy affecting right side, Increase shoulder pain on the  left side.    PT Frequency 2x / week   PT Duration 6 weeks   PT Treatment/Interventions Passive range of motion;Neuromuscular re-education;Manual techniques;ADLs/Self Care Home Management;Ultrasound;Electrical Stimulation;Iontophoresis 4mg /ml Dexamethasone;Moist Heat;Therapeutic exercise;Therapeutic activities;Patient/family education   PT Next Visit Plan Advance scapular stabilization and ROM/strengthening exercises, pain control   PT Home Exercise Plan Scapular retractions, AAROM shoulder flexion in supine, sidelying shoulder ER,  serratus punches, serratus punches, weighted shoulder flexion.    Consulted and Agree with Plan of Care Patient        Problem List Patient Active Problem List   Diagnosis Date Noted  . 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    Blythe Stanford, SPT 10/26/2015, 4:42 PM  [facility] [address], [name], [address] Phone: [phone]   Fax:  [phone]  Name: Laura Hale MRN: [id number] Date of Birth: July 11, 1946

## 2015-11-01 ENCOUNTER — Encounter: Payer: Self-pay | Admitting: Physical Therapy

## 2015-11-01 ENCOUNTER — Ambulatory Visit: Payer: PPO | Admitting: Physical Therapy

## 2015-11-01 DIAGNOSIS — G8918 Other acute postprocedural pain: Secondary | ICD-10-CM

## 2015-11-01 DIAGNOSIS — R29898 Other symptoms and signs involving the musculoskeletal system: Secondary | ICD-10-CM

## 2015-11-01 DIAGNOSIS — M25511 Pain in right shoulder: Secondary | ICD-10-CM

## 2015-11-01 NOTE — Therapy (Signed)
[facility] [address], [name], [address] Phone: [phone]   Fax:  [phone]  Physical Therapy Treatment  Patient Details  Name: Laura Hale MRN: [id number] Date of Birth: Nov 17, 1945 Referring Provider: Milagros Evener, MD  Encounter Date: 11/01/2015      PT End of Session - 11/01/15 1140    Visit Number 6   Number of Visits 12   Date for PT Re-Evaluation 11/24/15   Authorization Type 6   Authorization Time Period 10 (G Code)   PT Start Time [phone]   PT Stop Time 0915   PT Time Calculation (min) 39 min   Activity Tolerance No increased pain;Patient tolerated treatment well   Behavior During Therapy [facility] for tasks assessed/performed      Past Medical History  Diagnosis Date  . Arthritis     elbow, hands, neck   . Atypical childhood psychosis 2007    myoview -negative for ischemia with preserved LV function  . Colon polyps   . Hypercalcemia 2009    calcium  WNL 10.2 on 04-2015  . Hyperlipidemia   . Obesity   . Urinary incontinence   . Asthma     allergy induced asthma-only exacerbated by smells like perfume, smoke etc  . Complication of anesthesia     WITH ACDF IN JUNE 2016-PT STATES SHE LOST HER VOICE X 10 WEEKS DUE TO INTUBATION    Past Surgical History  Procedure Laterality Date  . Elbow surgery Left 04/2003    x2  & for repair & then remove all hardware, due to injury from a fall  . Hand surgery Right 2006    injury- then eventually lost remainder of thumb  . Abdominal hysterectomy    . Anterior cervical decomp/discectomy fusion N/A 01/31/2015    Procedure: ANTERIOR CERVICAL DECOMPRESSION/DISCECTOMY FUSION CERVICAL FIVE SIX;  Surgeon: Karie Chimera, MD;  Location: [facility];  Service: Neurosurgery;  Laterality: N/A;  . Colonoscopy    . 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 Orif humerus fracture Right 08/31/2015    Procedure: OPEN REDUCTION INTERNAL FIXATION (ORIF) PROXIMAL HUMERUS  FRACTURE w/ #2 fberwire;  Surgeon: Corky Mull, MD;  Location: [facility];  Service: Orthopedics;  Laterality: Right;    There were no vitals filed for this visit.  Visit Diagnosis:  Pain in joint of right shoulder  Pain at surgical site  Shoulder weakness      Subjective Assessment - 11/01/15 0838    Subjective Patient reports no shoulder soreness today and states she was able to perform cooking duties with more ease. Continues with limitations when lifting objects.   Limitations House hold activities;Lifting;Other (comment)   Patient Stated Goals Would like to be able to drive and use R shoulder without pain.    Currently in Pain? No/denies      Objective:   Observation: Sitting posture: Increased forward head posture. Palpation: Tender to palpation with increased muscle guarding/spasms on right: upper trap,scapular retractors, infraspinatus,and teres minor. AROM: Shoulder: AROM Flexion in sitting : 155 degrees, abd 155ER: 60 degrees, behind the back shoulder internal ROM: L3 level   Therapeutic Exercise:  Patient performed exercises with guidance, verbal and tactile cues and demonstration of therapist:  Scapular retractions in sitting at [place]-- x 10 #10 Straight arm shoulder extension at [place] -- x10 #10 Reverse chin up at [facility] -- x 10 #10 Bilateral shoulder ER with yellow tubing -- 2 x 10 --  Performed through decreased ROM with towels under arms bilaterally to increase muscle activation UE ranger internal/external ROM -- x20     Manual Therapy: STM using superficial and deep techniques to the teres musculature, infraspinatus, scapular retractors and upper trap to decrease muscular spasms and guarding. Patient is seated position  Patient response to treatment: Spasms decreased by 66% after performing STM indicating improved tissue elasticity and muscle spasms. Performed bilateral shoulder ER with yellow band through decreased ROM to increase appropriate muscular  activation and decrease symptoms.            PT Education - 11/01/15 1140    Education provided Yes   Education Details HEP: Bilateral shoulder ER with yellow tubing   Person(s) Educated Patient   Methods Explanation;Demonstration   Comprehension Verbalized understanding;Returned demonstration             PT Long Term Goals - 10/12/15 1521    PT LONG TERM GOAL #1   Title Patient will present with an NDI score of <14% impaired by 11/24/15 to show significant improvement with neck function to improve head turning for driving.   Baseline 24% impaired   Status New   PT LONG TERM GOAL #2   Title Patient will present with an QuickDASH score of <20% impaired by 11/24/15 to show significant improvement with shoulder function to improve ability to chop food while cooking.   Baseline 51% Impaired   Status New   PT LONG TERM GOAL #3   Title Patient will be independent with HEP focusing on increasing muscular endurance, coordination, and strength by 11/24/15 to progress tissue healing after therapy is complete.   Baseline limited knowledge of pain control and appropriate progression of exercises   Status New               Plan - 11/01/15 1153    Clinical Impression Statement Patient making progress towards long term goals demonstrating improved shoulder ROM in abduction, flexion, and abduction indicating functional carryover between visits. Patient continues to improve in motor control but still limited in muscular endurance and will benefit from further skilled therapy to return to proir level of function.    Pt will benefit from skilled therapeutic intervention in order to improve on the following deficits Decreased range of motion;Increased fascial restricitons;Impaired UE functional use;Increased muscle spasms;Decreased endurance;Decreased activity tolerance;Pain;Impaired flexibility;Hypomobility;Decreased strength;Impaired sensation   Rehab Potential Good   Clinical  Impairments Affecting Rehab Potential (+) highly motivated, acute condition, active prior level of function; (-) previous discectomy affecting right side, Increase shoulder pain on the left side.    PT Frequency 2x / week   PT Duration 6 weeks   PT Treatment/Interventions Passive range of motion;Neuromuscular re-education;Manual techniques;ADLs/Self Care Home Management;Ultrasound;Electrical Stimulation;Iontophoresis 4mg /ml Dexamethasone;Moist Heat;Therapeutic exercise;Therapeutic activities;Patient/family education   PT Next Visit Plan Advance scapular stabilization and ROM/strengthening exercises, pain control   PT Home Exercise Plan Scapular retractions, AAROM shoulder flexion in supine, sidelying shoulder ER,  serratus punches, serratus punches, weighted shoulder flexion.    Consulted and Agree with Plan of Care Patient        Problem List Patient Active Problem List   Diagnosis Date Noted  . 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    Blythe Stanford, SPT 11/01/2015, 12:03 PM  [facility] [address], [name], [address] Phone: [phone]   Fax:  [phone]  Name: Laura Hale MRN: [id number] Date of Birth: 1945/10/05

## 2015-11-03 ENCOUNTER — Encounter: Payer: Self-pay | Admitting: Physical Therapy

## 2015-11-03 ENCOUNTER — Ambulatory Visit: Payer: PPO | Admitting: Physical Therapy

## 2015-11-03 DIAGNOSIS — G8918 Other acute postprocedural pain: Secondary | ICD-10-CM

## 2015-11-03 DIAGNOSIS — M25511 Pain in right shoulder: Secondary | ICD-10-CM | POA: Diagnosis not present

## 2015-11-03 DIAGNOSIS — R29898 Other symptoms and signs involving the musculoskeletal system: Secondary | ICD-10-CM

## 2015-11-03 NOTE — Therapy (Signed)
[facility] [address] [name], [address] Phone: [phone]   Fax:  [phone]  Physical Therapy Treatment  Patient Details  Name: Laura Hale MRN: [id number] Date of Birth: 04-Jun-1946 Referring Provider: Milagros Evener, MD  Encounter Date: 11/03/2015      PT End of Session - 11/03/15 0839    Visit Number 7   Number of Visits 12   Date for PT Re-Evaluation 11/24/15   Authorization Type 7   Authorization Time Period 10 (G Code)   PT Start Time 0802   PT Stop Time 0831   PT Time Calculation (min) 29 min   Activity Tolerance No increased pain;Patient tolerated treatment well   Behavior During Therapy [facility] for tasks assessed/performed      Past Medical History  Diagnosis Date  . Arthritis     elbow, hands, neck   . Atypical childhood psychosis 2007    myoview -negative for ischemia with preserved LV function  . Colon polyps   . Hypercalcemia 2009    calcium  WNL 10.2 on 04-2015  . Hyperlipidemia   . Obesity   . Urinary incontinence   . Asthma     allergy induced asthma-only exacerbated by smells like perfume, smoke etc  . Complication of anesthesia     WITH ACDF IN JUNE 2016-PT STATES SHE LOST HER VOICE X 10 WEEKS DUE TO INTUBATION    Past Surgical History  Procedure Laterality Date  . Elbow surgery Left 04/2003    x2  & for repair & then remove all hardware, due to injury from a fall  . Hand surgery Right 2006    injury- then eventually lost remainder of thumb  . Abdominal hysterectomy    . Anterior cervical decomp/discectomy fusion N/A 01/31/2015    Procedure: ANTERIOR CERVICAL DECOMPRESSION/DISCECTOMY FUSION CERVICAL FIVE SIX;  Surgeon: Karie Chimera, MD;  Location: [facility];  Service: Neurosurgery;  Laterality: N/A;  . Colonoscopy    . 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 Orif humerus fracture Right 08/31/2015    Procedure: OPEN REDUCTION INTERNAL FIXATION (ORIF) PROXIMAL HUMERUS  FRACTURE w/ #2 fberwire;  Surgeon: Corky Mull, MD;  Location: [facility];  Service: Orthopedics;  Laterality: Right;    There were no vitals filed for this visit.  Visit Diagnosis:  Pain at surgical site  Shoulder weakness  Pain in joint of right shoulder      Subjective Assessment - 11/03/15 0803    Subjective Patient reports muscular soreness today in bilateral shoulders from exercises. States she has less shoulder pain with preparing food.   Limitations House hold activities;Lifting;Other (comment)   Patient Stated Goals Would like to be able to drive and use R shoulder without pain.    Currently in Pain? No/denies      Objective:   Observation: Sitting posture: Increased forward head posture. Palpation: Tender to palpation with increased muscle guarding/spasms on right: upper trap,scapular retractors, infraspinatus,and teres minor. AROM: Shoulder: AROM Flexion in sitting : 155 degrees, abd 155ER: 60 degrees, behind the back shoulder internal ROM: L3 level   Therapeutic Exercise:  Patient performed exercises with guidance, verbal and tactile cues and demonstration of therapist:  Scapular retractions in sitting at [place]-- x 10 #10 Straight arm shoulder extension at [place] -- x10 #10 Bilateral shoulder ER with yellow band-- 2 x 10 -- Performed through decreased ROM with towels under arms bilaterally to increase muscle activation UE ranger  internal/external ROM -- x20  1# UE ranger shoulder flexion -- 1# x 10, x10 (unweighted)  Shoulder flexion with yellow physioball -- x 15    Manual Therapy: STM using superficial and deep techniques to the teres musculature, infraspinatus, and upper trap to decrease muscular spasms and guarding. Patient is seated position.  Patient response to treatment: Spasms decreased by 50% after performing STM indicating improved tissue elasticity and muscle spasms. Good demonstration of muscular activation when performing bilateral shoulder ER  requiring minimal cueing to engage scapular retractors.           PT Education - 11/03/15 [phone]    Education provided Yes   Education Details HEP: bilateral shoulder flexion on yellow physioball   Person(s) Educated Patient   Methods Explanation;Demonstration   Comprehension Verbalized understanding;Returned demonstration             PT Long Term Goals - 10/12/15 1521    PT LONG TERM GOAL #1   Title Patient will present with an NDI score of <14% impaired by 11/24/15 to show significant improvement with neck function to improve head turning for driving.   Baseline 24% impaired   Status New   PT LONG TERM GOAL #2   Title Patient will present with an QuickDASH score of <20% impaired by 11/24/15 to show significant improvement with shoulder function to improve ability to chop food while cooking.   Baseline 51% Impaired   Status New   PT LONG TERM GOAL #3   Title Patient will be independent with HEP focusing on increasing muscular endurance, coordination, and strength by 11/24/15 to progress tissue healing after therapy is complete.   Baseline limited knowledge of pain control and appropriate progression of exercises   Status New               Plan - 11/03/15 0840    Clinical Impression Statement Patient making progress towards long term goals demonstrating improved shoulder mobility and motor control with therapeutic exercise. Although patient is improving, she continues to demonstrate decreased muscular endurance and strength and will benefit from further skilled therapy to return to prior level of function.    Pt will benefit from skilled therapeutic intervention in order to improve on the following deficits Decreased range of motion;Increased fascial restricitons;Impaired UE functional use;Increased muscle spasms;Decreased endurance;Decreased activity tolerance;Pain;Impaired flexibility;Hypomobility;Decreased strength;Impaired sensation   Rehab Potential Good   Clinical  Impairments Affecting Rehab Potential (+) highly motivated, acute condition, active prior level of function; (-) previous discectomy affecting right side, Increase shoulder pain on the left side.    PT Frequency 2x / week   PT Duration 6 weeks   PT Treatment/Interventions Passive range of motion;Neuromuscular re-education;Manual techniques;ADLs/Self Care Home Management;Ultrasound;Electrical Stimulation;Iontophoresis 4mg /ml Dexamethasone;Moist Heat;Therapeutic exercise;Therapeutic activities;Patient/family education   PT Next Visit Plan Advance scapular stabilization and ROM/strengthening exercises, pain control   PT Home Exercise Plan Scapular retractions, AAROM shoulder flexion in supine, sidelying shoulder ER,  serratus punches, serratus punches, weighted shoulder flexion.    Consulted and Agree with Plan of Care Patient        Problem List Patient Active Problem List   Diagnosis Date Noted  . 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    Blythe Stanford, SPT 11/03/2015, 8:46 AM  [facility] [address] [name], [address] Phone: [phone]   Fax:  [phone]  Name: Laura Hale MRN: [id number] Date of Birth: [birth year]

## 2015-11-07 ENCOUNTER — Encounter: Payer: Self-pay | Admitting: Physical Therapy

## 2015-11-07 ENCOUNTER — Ambulatory Visit: Payer: PPO | Admitting: Physical Therapy

## 2015-11-07 VITALS — BP 140/80

## 2015-11-07 DIAGNOSIS — R29898 Other symptoms and signs involving the musculoskeletal system: Secondary | ICD-10-CM

## 2015-11-07 DIAGNOSIS — M25511 Pain in right shoulder: Secondary | ICD-10-CM | POA: Diagnosis not present

## 2015-11-07 DIAGNOSIS — G8918 Other acute postprocedural pain: Secondary | ICD-10-CM

## 2015-11-07 NOTE — Therapy (Signed)
[facility] [address] [name], [address] Phone: [phone]   Fax:  [phone]  Physical Therapy Treatment  Patient Details  Name: Laura Hale MRN: [id number] Date of Birth: July 06, 1946 Referring Provider: Milagros Evener, MD  Encounter Date: 11/07/2015      PT End of Session - 11/07/15 1518    Visit Number 8   Number of Visits 12   Date for PT Re-Evaluation 11/24/15   Authorization Type 8   Authorization Time Period 10 (G Code)   PT Start Time 0935   PT Stop Time [id number]   PT Time Calculation (min) 39 min   Activity Tolerance No increased pain;Patient tolerated treatment well   Behavior During Therapy [facility] for tasks assessed/performed      Past Medical History  Diagnosis Date  . Arthritis     elbow, hands, neck   . Atypical childhood psychosis 2007    myoview -negative for ischemia with preserved LV function  . Colon polyps   . Hypercalcemia 2009    calcium  WNL 10.2 on 04-2015  . Hyperlipidemia   . Obesity   . Urinary incontinence   . Asthma     allergy induced asthma-only exacerbated by smells like perfume, smoke etc  . Complication of anesthesia     WITH ACDF IN JUNE 2016-PT STATES SHE LOST HER VOICE X 10 WEEKS DUE TO INTUBATION    Past Surgical History  Procedure Laterality Date  . Elbow surgery Left 04/2003    x2  & for repair & then remove all hardware, due to injury from a fall  . Hand surgery Right 2006    injury- then eventually lost remainder of thumb  . Abdominal hysterectomy    . Anterior cervical decomp/discectomy fusion N/A 01/31/2015    Procedure: ANTERIOR CERVICAL DECOMPRESSION/DISCECTOMY FUSION CERVICAL FIVE SIX;  Surgeon: Karie Chimera, MD;  Location: [facility];  Service: Neurosurgery;  Laterality: N/A;  . Colonoscopy    . 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 Orif humerus fracture Right 08/31/2015    Procedure: OPEN REDUCTION INTERNAL FIXATION (ORIF) PROXIMAL HUMERUS  FRACTURE w/ #2 fberwire;  Surgeon: Corky Mull, MD;  Location: [facility];  Service: Orthopedics;  Laterality: Right;    Filed Vitals:   11/07/15 0943  BP: 140/80    Visit Diagnosis:  Shoulder weakness  Pain at surgical site  Pain in joint of right shoulder  Subjective Assessment - 11/07/15 10:03    Subjective Patient reports slight increase in muscle sorness from performing exercises over the weekend. States feeling 2 transient episodes of numbness and tingling on the left side of her body during the past few nights. Patient denies any feelings of current left sided numbness, tingling, diplopia, dysarthria, nystagmus, nausea, loss of consciousness, dysphagia, syncope, or dizziness. Reports increased swelling along proximal aspect medial right elbow along common wrist flexor tendon which decreased in size after iceing, elbow movement and elevation.    Limitations House hold activities;Lifting;Other (comment)   Patient Stated Goals Would like to be able to drive and use R shoulder without pain.    Currently in Pain? No/denies     Objective:   Observation: Sitting posture: Increased forward head posture. Palpation: Tender to palpation with increased muscle guarding/spasms on right: scapular retractors, infraspinatus,and teres minor/major. AROM: Shoulder: AROM Flexion in sitting : 160 degrees, abd 160ER: 60 degrees, behind the back shoulder internal ROM: L3 level  Neuro Exam: Cranial  Nerve Screening: WNL -- no deviations nor onset of numbness/tingling symptoms  Measurement:  BP: 140/18mmHg taken with patient in sitting with support under arm  Therapeutic Exercise:  Patient performed exercises with guidance, verbal and tactile cues and demonstration of therapist:  Scapular retractions in sitting at [place]-- x 15 #10 Sidelying shoulder ER with 2# -- x 15 Straight arm shoulder extension at [place] -- x10 #10 Push up plus against wall in standing -- x 15 UE ranger  internal/external ROM -- 1x15 1# UE ranger shoulder flexion -- 1# x 15 Shoulder flexion with yellow physioball -- x 15  Educated patient to monitor BP regularly and contact primary MD to update on numbness/tingling symptoms. Patient agrees and confirms understanding.   Manual Therapy: STM using superficial and deep techniques to the teres musculature, infraspinatus, scapular retractors to decrease muscular spasms and guarding. Patient is seated position.  Patient response to treatment: No episodes of syncope of dizziness noted during exercise. Spasms decreased by 33% after performing STM indicating improved tissue elasticity and muscle spasms. Good demonstration of technique when performing push up plus requiring minimal tactile cueing to activate serratus anterior musculature.          PT Education - 11/07/15 1517    Education provided Yes   Education Details HEP: Side lying shoulder external rotation with 2# weight   Person(s) Educated Patient   Methods Explanation;Demonstration   Comprehension Verbalized understanding;Returned demonstration             PT Long Term Goals - 10/12/15 1521    PT LONG TERM GOAL #1   Title Patient will present with an NDI score of <14% impaired by 11/24/15 to show significant improvement with neck function to improve head turning for driving.   Baseline 24% impaired   Status New   PT LONG TERM GOAL #2   Title Patient will present with an QuickDASH score of <20% impaired by 11/24/15 to show significant improvement with shoulder function to improve ability to chop food while cooking.   Baseline 51% Impaired   Status New   PT LONG TERM GOAL #3   Title Patient will be independent with HEP focusing on increasing muscular endurance, coordination, and strength by 11/24/15 to progress tissue healing after therapy is complete.   Baseline limited knowledge of pain control and appropriate progression of exercises   Status New               Plan  - 11/07/15 1536    Clinical Impression Statement Patient symptoms monitored regularly throughout treatment for signs of syncope, numbness and tingling, and dizzinessness. Pt tolerated therapeutic exercise without onset of symptoms indicating improvement in motor control and muscular endurance. Although patient is improving, she remains to have limited muscular endurance and coordination most noted in shoulder ER and will benefit from further skilled therapy to return to prior level of function.    Pt will benefit from skilled therapeutic intervention in order to improve on the following deficits Decreased range of motion;Increased fascial restricitons;Impaired UE functional use;Increased muscle spasms;Decreased endurance;Decreased activity tolerance;Pain;Impaired flexibility;Hypomobility;Decreased strength;Impaired sensation   Rehab Potential Good   Clinical Impairments Affecting Rehab Potential (+) highly motivated, acute condition, active prior level of function; (-) previous discectomy affecting right side, Increase shoulder pain on the left side.    PT Frequency 2x / week   PT Duration 6 weeks   PT Treatment/Interventions Passive range of motion;Neuromuscular re-education;Manual techniques;ADLs/Self Care Home Management;Ultrasound;Electrical Stimulation;Iontophoresis 4mg /ml Dexamethasone;Moist Heat;Therapeutic exercise;Therapeutic activities;Patient/family education  PT Next Visit Plan Advance scapular stabilization and ROM/strengthening exercises, pain control   PT Home Exercise Plan Scapular retractions, AAROM shoulder flexion in supine, sidelying shoulder ER,  serratus punches, serratus punches, weighted shoulder flexion.    Consulted and Agree with Plan of Care Patient        Problem List Patient Active Problem List   Diagnosis Date Noted  . 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    Blythe Stanford, SPT 11/07/2015, 3:42 PM  [facility] [address], [name], [address] Phone: [phone]   Fax:  [phone]  Name: VIRTUE KLINT MRN: [id number] Date of Birth: May 17, 1946

## 2015-11-08 ENCOUNTER — Ambulatory Visit
Admission: RE | Admit: 2015-11-08 | Discharge: 2015-11-08 | Disposition: A | Discharge status: Home / Self Care | Payer: PPO | Source: Ambulatory Visit | Attending: Internal Medicine | Admitting: Internal Medicine

## 2015-11-08 DIAGNOSIS — M25561 Pain in right knee: Secondary | ICD-10-CM | POA: Diagnosis not present

## 2015-11-08 DIAGNOSIS — X58XXXA Exposure to other specified factors, initial encounter: Secondary | ICD-10-CM | POA: Insufficient documentation

## 2015-11-08 DIAGNOSIS — G8929 Other chronic pain: Secondary | ICD-10-CM

## 2015-11-08 DIAGNOSIS — S83271A Complex tear of lateral meniscus, current injury, right knee, initial encounter: Secondary | ICD-10-CM | POA: Diagnosis not present

## 2015-11-09 ENCOUNTER — Ambulatory Visit: Payer: PPO | Admitting: Physical Therapy

## 2015-11-09 ENCOUNTER — Encounter: Payer: Self-pay | Admitting: Physical Therapy

## 2015-11-09 DIAGNOSIS — M25511 Pain in right shoulder: Secondary | ICD-10-CM

## 2015-11-09 DIAGNOSIS — G8918 Other acute postprocedural pain: Secondary | ICD-10-CM

## 2015-11-09 DIAGNOSIS — R29898 Other symptoms and signs involving the musculoskeletal system: Secondary | ICD-10-CM

## 2015-11-09 NOTE — Therapy (Signed)
[place] [facility] [address] [name], [address] Phone: [phone]   Fax:  [phone]  Physical Therapy Treatment  Patient Details  Name: Laura Hale MRN: [id number] Date of Birth: [birth year] Referring Provider: Milagros Evener, MD  Encounter Date: 11/09/2015      PT End of Session - 11/09/15 1507    Visit Number 9   Number of Visits 12   Date for PT Re-Evaluation 11/24/15   Authorization Type 9   Authorization Time Period 10 (G Code)   PT Start Time [phone]   PT Stop Time 0930   PT Time Calculation (min) 46 min   Activity Tolerance No increased pain;Patient tolerated treatment well   Behavior During Therapy [facility] for tasks assessed/performed      Past Medical History  Diagnosis Date  . Arthritis     elbow, hands, neck   . Atypical childhood psychosis 2007    myoview -negative for ischemia with preserved LV function  . Colon polyps   . Hypercalcemia 2009    calcium  WNL 10.2 on 04-2015  . Hyperlipidemia   . Obesity   . Urinary incontinence   . Asthma     allergy induced asthma-only exacerbated by smells like perfume, smoke etc  . Complication of anesthesia     WITH ACDF IN JUNE 2016-PT STATES SHE LOST HER VOICE X 10 WEEKS DUE TO INTUBATION    Past Surgical History  Procedure Laterality Date  . Elbow surgery Left 04/2003    x2  & for repair & then remove all hardware, due to injury from a fall  . Hand surgery Right 2006    injury- then eventually lost remainder of thumb  . Abdominal hysterectomy    . Anterior cervical decomp/discectomy fusion N/A 01/31/2015    Procedure: ANTERIOR CERVICAL DECOMPRESSION/DISCECTOMY FUSION CERVICAL FIVE SIX;  Surgeon: Karie Chimera, MD;  Location: [place] [facility];  Service: Neurosurgery;  Laterality: N/A;  . Colonoscopy    . 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 Orif humerus fracture Right 08/31/2015    Procedure: OPEN REDUCTION INTERNAL FIXATION (ORIF) PROXIMAL HUMERUS  FRACTURE w/ #2 fberwire;  Surgeon: Corky Mull, MD;  Location: [facility];  Service: Orthopedics;  Laterality: Right;    There were no vitals filed for this visit.  Visit Diagnosis:  Shoulder weakness  Pain at surgical site  Pain in joint of right shoulder      Subjective Assessment - 11/09/15 0846    Subjective Patient reports less soreness today and mentions swelling on the inside of the elbow is improving. States she has not had any more episodes of left sided numbness and tingling    Limitations House hold activities;Lifting;Other (comment)   Patient Stated Goals Would like to be able to drive and use R shoulder without pain.    Currently in Pain? No/denies      Objective:   Observation: Sitting posture: Increased forward head posture. Palpation: Tender to palpation with increased muscle guarding/spasms on right: scapular retractors, infraspinatus,and teres minor/major. AROM: Shoulder: AROM Flexion in sitting : 160 degrees, abd 160ER: 60 degrees, behind the back shoulder internal ROM: L3 level   Therapeutic Exercise:  Patient performed exercises with guidance, verbal and tactile cues and demonstration of therapist:  Scapular retractions in sitting at [place]-- x 15 #10 Sidelying shoulder ER with 2# --  2x 15 Straight arm shoulder extension at [place] -- x10 #10 Reverse Pull up at [facility] --  x15 #15 Sidelying manually resisted scapular depression -- 2 x15 UE ranger internal/external ROM -- 2x15 2# UE ranger shoulder flexion ROM --  2x 15 2#   Manual Therapy: STM using superficial and deep techniques to the teres musculature, infraspinatus, upper trap scapular retractors to decrease muscle spasms and guarding. Patient positioned in sitting.  Patient response to treatment: Spasms decreased 25% after performing STM indicating improved tissue elasticity and muscle spasms. Good demonstration of technique when performing scapular depression against resistance in sidelying  requiring minimal tactile cueing to activate lower trapezius musculature.         PT Education - 11/09/15 1506    Education provided Yes   Education Details Educated on proper scapular position when performing exercise.    Person(s) Educated Patient   Methods Explanation;Demonstration   Comprehension Verbalized understanding             PT Long Term Goals - 10/12/15 1521    PT LONG TERM GOAL #1   Title Patient will present with an NDI score of <14% impaired by 11/24/15 to show significant improvement with neck function to improve head turning for driving.   Baseline 24% impaired   Status New   PT LONG TERM GOAL #2   Title Patient will present with an QuickDASH score of <20% impaired by 11/24/15 to show significant improvement with shoulder function to improve ability to chop food while cooking.   Baseline 51% Impaired   Status New   PT LONG TERM GOAL #3   Title Patient will be independent with HEP focusing on increasing muscular endurance, coordination, and strength by 11/24/15 to progress tissue healing after therapy is complete.   Baseline limited knowledge of pain control and appropriate progression of exercises   Status New               Plan - 11/09/15 1512    Clinical Impression Statement Patient making progress towards long term goals demonstrating ability to tolerate advancvement of exercise without onset of symptoms. Pt demonstrates improved shoulder ER AROM today versus previous visits indicating functional carryover. Although patient is improving she continues to experience limitations with motor control and muscular endurance and will benefit from further skilled therapy to return to prior level of function.    Pt will benefit from skilled therapeutic intervention in order to improve on the following deficits Decreased range of motion;Increased fascial restricitons;Impaired UE functional use;Increased muscle spasms;Decreased endurance;Decreased activity  tolerance;Pain;Impaired flexibility;Hypomobility;Decreased strength;Impaired sensation   Rehab Potential Good   Clinical Impairments Affecting Rehab Potential (+) highly motivated, acute condition, active prior level of function; (-) previous discectomy affecting right side, Increase shoulder pain on the left side.    PT Frequency 2x / week   PT Duration 6 weeks   PT Treatment/Interventions Passive range of motion;Neuromuscular re-education;Manual techniques;ADLs/Self Care Home Management;Ultrasound;Electrical Stimulation;Iontophoresis 4mg /ml Dexamethasone;Moist Heat;Therapeutic exercise;Therapeutic activities;Patient/family education   PT Next Visit Plan Advance scapular stabilization and ROM/strengthening exercises, pain control   PT Home Exercise Plan Scapular retractions, AAROM shoulder flexion in supine, sidelying shoulder ER,  serratus punches, serratus punches, weighted shoulder flexion.    Consulted and Agree with Plan of Care Patient        Problem List Patient Active Problem List   Diagnosis Date Noted  . 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    Blythe Stanford, SPT 11/09/2015, 3:16 PM  [place] [facility] [address] [name], [address] Phone: [phone]   Fax:  [phone]  Name: Laura Hale MRN: [id number] Date of Birth: Mar 10, 1946

## 2015-11-13 DIAGNOSIS — D229 Melanocytic nevi, unspecified: Secondary | ICD-10-CM | POA: Diagnosis not present

## 2015-11-13 DIAGNOSIS — L820 Inflamed seborrheic keratosis: Secondary | ICD-10-CM | POA: Diagnosis not present

## 2015-11-13 DIAGNOSIS — Z85828 Personal history of other malignant neoplasm of skin: Secondary | ICD-10-CM | POA: Diagnosis not present

## 2015-11-14 ENCOUNTER — Ambulatory Visit: Payer: PPO | Attending: Surgery | Admitting: Physical Therapy

## 2015-11-14 ENCOUNTER — Encounter: Payer: Self-pay | Admitting: Physical Therapy

## 2015-11-14 DIAGNOSIS — G8918 Other acute postprocedural pain: Secondary | ICD-10-CM

## 2015-11-14 DIAGNOSIS — M25561 Pain in right knee: Secondary | ICD-10-CM | POA: Insufficient documentation

## 2015-11-14 DIAGNOSIS — M25511 Pain in right shoulder: Secondary | ICD-10-CM | POA: Diagnosis not present

## 2015-11-14 DIAGNOSIS — M25512 Pain in left shoulder: Secondary | ICD-10-CM | POA: Diagnosis not present

## 2015-11-14 DIAGNOSIS — R29898 Other symptoms and signs involving the musculoskeletal system: Secondary | ICD-10-CM | POA: Diagnosis not present

## 2015-11-14 DIAGNOSIS — M6281 Muscle weakness (generalized): Secondary | ICD-10-CM | POA: Insufficient documentation

## 2015-11-14 NOTE — Therapy (Addendum)
[place] [facility] [address], [name], [address] Phone: [phone]   Fax:  [phone]  Physical Therapy Treatment  Patient Details  Name: Laura Hale MRN: [id number] Date of Birth: 1946-01-19 Referring Provider: Milagros Evener, MD  Encounter Date: 11/14/2015      PT End of Session - 11/14/15 1118    Visit Number 10   Number of Visits 12   Date for PT Re-Evaluation 11/24/15   Authorization Type 10   Authorization Time Period 10 (G Code)   PT Start Time 0902   PT Stop Time 0946   PT Time Calculation (min) 44 min   Activity Tolerance No increased pain;Patient tolerated treatment well   Behavior During Therapy [facility] for tasks assessed/performed      Past Medical History  Diagnosis Date  . Arthritis     elbow, hands, neck   . Atypical childhood psychosis 2007    myoview -negative for ischemia with preserved LV function  . Colon polyps   . Hypercalcemia 2009    calcium  WNL 10.2 on 04-2015  . Hyperlipidemia   . Obesity   . Urinary incontinence   . Asthma     allergy induced asthma-only exacerbated by smells like perfume, smoke etc  . Complication of anesthesia     WITH ACDF IN JUNE 2016-PT STATES SHE LOST HER VOICE X 10 WEEKS DUE TO INTUBATION    Past Surgical History  Procedure Laterality Date  . Elbow surgery Left 04/2003    x2  & for repair & then remove all hardware, due to injury from a fall  . Hand surgery Right 2006    injury- then eventually lost remainder of thumb  . Abdominal hysterectomy    . Anterior cervical decomp/discectomy fusion N/A 01/31/2015    Procedure: ANTERIOR CERVICAL DECOMPRESSION/DISCECTOMY FUSION CERVICAL FIVE SIX;  Surgeon: Karie Chimera, MD;  Location: [place] [facility];  Service: Neurosurgery;  Laterality: N/A;  . Colonoscopy    . 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 Orif humerus fracture Right 08/31/2015    Procedure: OPEN REDUCTION INTERNAL FIXATION (ORIF) PROXIMAL HUMERUS  FRACTURE w/ #2 fberwire;  Surgeon: Corky Mull, MD;  Location: [facility];  Service: Orthopedics;  Laterality: Right;    There were no vitals filed for this visit.  Visit Diagnosis:  Shoulder weakness  Pain at surgical site  Pain in joint of right shoulder      Subjective Assessment - 11/14/15 0906    Subjective Patient reports a slight soreness in the shoulder over the weekend but states no current pain. States she has a severely torn meniscus on her affected knee per MD report.    Limitations House hold activities;Lifting;Other (comment)   Patient Stated Goals Would like to be able to drive and use R shoulder without pain.    Currently in Pain? No/denies      Objective:   Observation: Sitting posture: Increased forward head posture. Palpation: Tender to palpation with increased muscle guarding/spasms on right: scapular retractors, infraspinatus,and  teres minor/major. AROM: Shoulder: AROM Flexion in sitting : 160 degrees, abd 160 ER: 60 degrees, behind the back shoulder internal ROM: L3 level  Treatment:  Therapeutic Exercise:   Patient performed exercises with guidance, verbal and tactile cues and demonstration of therapist:  Passing 2# weight behind back-- 2 x 10 (both directions) Halo passes around head --x5 bilaterally with 2# weight tricep extension in standing behind head -- 2 x  10 2# Straight arm to hip shoulder extension in standing and prone -- 2 x 10 3# Sidelying shoulder ER with 3# -- x 20 Scapular retraction and in standing and prone -- 2 x 10 3# UE ranger internal/external ROM -- 2x15  3# (Towel under arm) UE ranger shoulder flexion ROM --  2x 15 3#   Manual Therapy: STM using superficial and deep techniques to the teres musculature, infraspinatus, upper trap scapular retractors to decrease muscle spasms and guarding. Patient positioned in sitting.  Patient response to treatment: Spasms decreased 33% after performing STM indicating improved tissue elasticity  and muscle spasms. Increased symptoms and pain in anterior aspect of shoulder when performing shoulder ER with ranger and required cueing to activate scapular retractors to correct form and decrease pain.           PT Education - 12-01-15 1117    Education provided Yes   Education Details HEP: Passing weight behind the back, halo rotations around head, shoulder extension in prone/standing, shoulder retraction in prone and standing   Person(s) Educated Patient   Methods Explanation;Demonstration   Comprehension Verbalized understanding;Returned demonstration             PT Long Term Goals - 2015-12-01 1843    PT LONG TERM GOAL #1   Title Patient will present with an NDI score of <14% impaired by 11/24/15 to show significant improvement with neck function to improve head turning for driving.   Baseline 24% impaired   Status On-going   PT LONG TERM GOAL #2   Title Patient will present with an QuickDASH score of <20% impaired by 11/24/15 to show significant improvement with shoulder function to improve ability to chop food while cooking.   Baseline 51% Impaired   Status On-going   PT LONG TERM GOAL #3   Title Patient will be independent with HEP focusing on increasing muscular endurance, coordination, and strength by 11/24/15 to progress tissue healing after therapy is complete.   Baseline limited knowledge of pain control and appropriate progression of exercises   Status On-going               Plan - Dec 01, 2015 1124    Clinical Impression Statement Patient making progress towards long term goals with improved shoulder mobility today most noted in ER/IR. Patient demonstrates increased scapular anterior tilt with exercise performance indicating decreased motor control and will benefit from further skilled therapy aimed at improving muscular strength, coordination/endurance to return to prior level of function.    Pt will benefit from skilled therapeutic intervention in order to  improve on the following deficits Decreased range of motion;Increased fascial restricitons;Impaired UE functional use;Increased muscle spasms;Decreased endurance;Decreased activity tolerance;Pain;Impaired flexibility;Hypomobility;Decreased strength;Impaired sensation   Rehab Potential Good   Clinical Impairments Affecting Rehab Potential (+) highly motivated, acute condition, active prior level of function; (-) previous discectomy affecting right side, Increase shoulder pain on the left side.    PT Frequency 2x / week   PT Duration 6 weeks   PT Treatment/Interventions Passive range of motion;Neuromuscular re-education;Manual techniques;ADLs/Self Care Home Management;Ultrasound;Electrical Stimulation;Iontophoresis 4mg /ml Dexamethasone;Moist Heat;Therapeutic exercise;Therapeutic activities;Patient/family education   PT Next Visit Plan Advance scapular stabilization and ROM/strengthening exercises, pain control   PT Home Exercise Plan Scapular retractions, AAROM shoulder flexion in supine, sidelying shoulder ER,  serratus punches, serratus punches, weighted shoulder flexion.    Consulted and Agree with Plan of Care Patient          G-Codes - 12-01-2015 1851    Functional Assessment  Tool Used QuickDash, NDI, ROM, strength, pain scale, clinical judgment   Carrying, Moving and Handling Objects Current Status [phone]) At least 20 percent but less than 40 percent impaired, limited or restricted   Carrying, Moving and Handling Objects Goal Status [id number]) At least 1 percent but less than 20 percent impaired, limited or restricted      Problem List Patient Active Problem List   Diagnosis Date Noted  . 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    Blythe Stanford, SPT 11/14/2015, 7:03 PM  [place] [facility] [address], [name], [address] Phone: [phone]   Fax:  [phone]  Name: CARIANN WEITMAN MRN: [id number] Date of Birth:  12-31-1945

## 2015-11-16 ENCOUNTER — Ambulatory Visit: Payer: PPO | Admitting: Physical Therapy

## 2015-11-16 ENCOUNTER — Encounter: Payer: Self-pay | Admitting: Physical Therapy

## 2015-11-16 DIAGNOSIS — R29898 Other symptoms and signs involving the musculoskeletal system: Secondary | ICD-10-CM | POA: Diagnosis not present

## 2015-11-16 DIAGNOSIS — M6281 Muscle weakness (generalized): Secondary | ICD-10-CM

## 2015-11-16 DIAGNOSIS — M25511 Pain in right shoulder: Secondary | ICD-10-CM

## 2015-11-16 NOTE — Therapy (Addendum)
[place] [facility] [address], [name], [address] Phone: [phone]   Fax:  [phone]  Physical Therapy Treatment  Patient Details  Name: Laura Hale MRN: [id number] Date of Birth: 1945/11/11 Referring Provider: Milagros Evener, MD  Encounter Date: 11/16/2015      PT End of Session - 11/16/15 1520    Visit Number 11   Number of Visits 12   Date for PT Re-Evaluation 11/24/15   Authorization Type 11   Authorization Time Period 20 (G Code)   PT Start Time [phone]   PT Stop Time 1002   PT Time Calculation (min) 46 min   Activity Tolerance No increased pain;Patient tolerated treatment well   Behavior During Therapy [facility] for tasks assessed/performed      Past Medical History  Diagnosis Date  . Arthritis     elbow, hands, neck   . Atypical childhood psychosis 2007    myoview -negative for ischemia with preserved LV function  . Colon polyps   . Hypercalcemia 2009    calcium  WNL 10.2 on 04-2015  . Hyperlipidemia   . Obesity   . Urinary incontinence   . Asthma     allergy induced asthma-only exacerbated by smells like perfume, smoke etc  . Complication of anesthesia     WITH ACDF IN JUNE 2016-PT STATES SHE LOST HER VOICE X 10 WEEKS DUE TO INTUBATION    Past Surgical History  Procedure Laterality Date  . Elbow surgery Left 04/2003    x2  & for repair & then remove all hardware, due to injury from a fall  . Hand surgery Right 2006    injury- then eventually lost remainder of thumb  . Abdominal hysterectomy    . Anterior cervical decomp/discectomy fusion N/A 01/31/2015    Procedure: ANTERIOR CERVICAL DECOMPRESSION/DISCECTOMY FUSION CERVICAL FIVE SIX;  Surgeon: Karie Chimera, MD;  Location: [place] [facility];  Service: Neurosurgery;  Laterality: N/A;  . Colonoscopy    . 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 Orif humerus fracture Right 08/31/2015    Procedure: OPEN REDUCTION INTERNAL FIXATION (ORIF) PROXIMAL HUMERUS  FRACTURE w/ #2 fberwire;  Surgeon: Corky Mull, MD;  Location: [facility];  Service: Orthopedics;  Laterality: Right;    There were no vitals filed for this visit.  Visit Diagnosis:  Muscle weakness (generalized)  Pain in right shoulder      Subjective Assessment - 11/16/15 0923    Subjective Patient reports some slight soreness in the shoulder but reports it's not painful. States she was able to sleep on her right side without increase in pain. She is to have follow up with Dr. Roland Rack next week.    Limitations House hold activities;Lifting;Other (comment)   Patient Stated Goals Would like to be able to drive and use R shoulder without pain.    Currently in Pain? No/denies      Objective:   Observation: Sitting posture: Increased forward head posture. Palpation: Tender to palpation with increased muscle guarding/spasms on right: scapular retractors, infraspinatus,and upper trap. AROM: Shoulder: AROM Flexion in sitting : WNLER: 60 degrees, behind the back shoulder internal ROM: L3 level  Treatment:  Therapeutic Exercise:  Patient performed exercises with guidance, verbal and tactile cues and demonstration of therapist:  Passing 3# weight behind back-- 2 x 10 (both directions) Straight arm to hip shoulder extension in standing and prone -- 2 x 15 3# Sidelying shoulder ER with 3# -- 2x  15 Resisted scapular depression in sidelying -- 2 x 15 Shoulder flexion with yellow physioball -- x 15 Scapular retraction at [place] in standing-- x 20 #15    Manual Therapy: STM using superficial and deep techniques to the scapular retractors and to decrease muscle spasms and guarding. Patient positioned in sitting.  Patient response to treatment: Spasms decreased 50% in scapular retractors after performing STM indicating improved tissue elasticity and muscle spasms. Good demonstration of muscle control when performing scapular retraction requiring minimal cueing cueing to decreased upper trap  activation.             PT Long Term Goals - 11/14/15 1843    PT LONG TERM GOAL #1   Title Patient will present with an NDI score of <14% impaired by 11/24/15 to show significant improvement with neck function to improve head turning for driving.   Baseline 24% impaired   Status On-going   PT LONG TERM GOAL #2   Title Patient will present with an QuickDASH score of <20% impaired by 11/24/15 to show significant improvement with shoulder function to improve ability to chop food while cooking.   Baseline 51% Impaired   Status On-going   PT LONG TERM GOAL #3   Title Patient will be independent with HEP focusing on increasing muscular endurance, coordination, and strength by 11/24/15 to progress tissue healing after therapy is complete.   Baseline limited knowledge of pain control and appropriate progression of exercises   Status On-going               Plan - 11/16/15 1532    Clinical Impression Statement Patient making progress towards long term goals demonstrating improved shoulder ER AROM and strength. Patient also demonstrates improved muscular control with scapular movements but continues to require tactile cueing on scapular setting before performing exercise. Plan to continue with therapy intervention through end of certification [id number] with anticipated discharge at that time.    Pt will benefit from skilled therapeutic intervention in order to improve on the following deficits Decreased range of motion;Increased fascial restricitons;Impaired UE functional use;Increased muscle spasms;Decreased endurance;Decreased activity tolerance;Pain;Impaired flexibility;Hypomobility;Decreased strength;Impaired sensation   Rehab Potential Good   Clinical Impairments Affecting Rehab Potential (+) highly motivated, acute condition, active prior level of function; (-) previous discectomy affecting right side, Increase shoulder pain on the left side.    PT Frequency 2x / week   PT Duration 6  weeks   PT Treatment/Interventions Passive range of motion;Neuromuscular re-education;Manual techniques;ADLs/Self Care Home Management;Ultrasound;Electrical Stimulation;Iontophoresis 4mg /ml Dexamethasone;Moist Heat;Therapeutic exercise;Therapeutic activities;Patient/family education   PT Next Visit Plan Advance scapular stabilization and ROM/strengthening exercises, pain control   PT Home Exercise Plan Scapular retractions, AAROM shoulder flexion in supine, sidelying shoulder ER,  serratus punches, serratus punches, weighted shoulder flexion.    Consulted and Agree with Plan of Care Patient        Problem List Patient Active Problem List   Diagnosis Date Noted  . 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    Blythe Stanford, SPT 11/17/2015, 1:41 PM  [place] [facility] [address] [name], [address] Phone: [phone]   Fax:  [phone]  Name: Laura Hale MRN: [id number] Date of Birth: 1946/05/07

## 2015-11-20 DIAGNOSIS — M1711 Unilateral primary osteoarthritis, right knee: Secondary | ICD-10-CM | POA: Insufficient documentation

## 2015-11-20 DIAGNOSIS — M7582 Other shoulder lesions, left shoulder: Secondary | ICD-10-CM | POA: Diagnosis not present

## 2015-11-20 DIAGNOSIS — S42251D Displaced fracture of greater tuberosity of right humerus, subsequent encounter for fracture with routine healing: Secondary | ICD-10-CM | POA: Diagnosis not present

## 2015-11-20 DIAGNOSIS — S42224D 2-part nondisplaced fracture of surgical neck of right humerus, subsequent encounter for fracture with routine healing: Secondary | ICD-10-CM | POA: Diagnosis not present

## 2015-11-20 DIAGNOSIS — M25511 Pain in right shoulder: Secondary | ICD-10-CM | POA: Diagnosis not present

## 2015-11-20 DIAGNOSIS — S83271A Complex tear of lateral meniscus, current injury, right knee, initial encounter: Secondary | ICD-10-CM | POA: Diagnosis not present

## 2015-11-20 HISTORY — DX: Complex tear of lateral meniscus, current injury, right knee, initial encounter: S83.271A

## 2015-11-20 HISTORY — DX: Other shoulder lesions, left shoulder: M75.82

## 2015-11-20 HISTORY — DX: Unilateral primary osteoarthritis, right knee: M17.11

## 2015-11-21 ENCOUNTER — Encounter: Payer: Self-pay | Admitting: Physical Therapy

## 2015-11-21 ENCOUNTER — Ambulatory Visit: Payer: PPO | Admitting: Physical Therapy

## 2015-11-21 DIAGNOSIS — M25511 Pain in right shoulder: Secondary | ICD-10-CM

## 2015-11-21 DIAGNOSIS — M6281 Muscle weakness (generalized): Secondary | ICD-10-CM

## 2015-11-21 DIAGNOSIS — R29898 Other symptoms and signs involving the musculoskeletal system: Secondary | ICD-10-CM | POA: Diagnosis not present

## 2015-11-21 NOTE — Therapy (Signed)
[facility] [address], [name], [address] Phone: [phone]   Fax:  [phone]  Physical Therapy Treatment/ Discharge Summary  Patient Details  Name: Laura Hale MRN: [id number] Date of Birth: 04/08/1946 Referring Provider: Milagros Evener, MD  Encounter Date: 11/21/2015   Patient began physical therapy 10/12/2015 and attended 12 sessions throughout 11/21/2015 with goals achieved and patient independent with home program for self management of symptoms and exercises. Plan discharge from physical therapy.       PT End of Session - 11/21/15 1043    Visit Number 12   Number of Visits 12   Date for PT Re-Evaluation 11/24/15   Authorization Type 12   Authorization Time Period 10 (G Code)   PT Start Time 0900   PT Stop Time 0945   PT Time Calculation (min) 45 min   Activity Tolerance No increased pain;Patient tolerated treatment well   Behavior During Therapy [facility] for tasks assessed/performed      Past Medical History  Diagnosis Date  . Arthritis     elbow, hands, neck   . Atypical childhood psychosis 2007    myoview -negative for ischemia with preserved LV function  . Colon polyps   . Hypercalcemia 2009    calcium  WNL 10.2 on 04-2015  . Hyperlipidemia   . Obesity   . Urinary incontinence   . Asthma     allergy induced asthma-only exacerbated by smells like perfume, smoke etc  . Complication of anesthesia     WITH ACDF IN JUNE 2016-PT STATES SHE LOST HER VOICE X 10 WEEKS DUE TO INTUBATION    Past Surgical History  Procedure Laterality Date  . Elbow surgery Left 04/2003    x2  & for repair & then remove all hardware, due to injury from a fall  . Hand surgery Right 2006    injury- then eventually lost remainder of thumb  . Abdominal hysterectomy    . Anterior cervical decomp/discectomy fusion N/A 01/31/2015    Procedure: ANTERIOR CERVICAL DECOMPRESSION/DISCECTOMY FUSION CERVICAL FIVE SIX;  Surgeon:  Karie Chimera, MD;  Location: [place] [facility];  Service: Neurosurgery;  Laterality: N/A;  . Colonoscopy    . 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 Orif humerus fracture Right 08/31/2015    Procedure: OPEN REDUCTION INTERNAL FIXATION (ORIF) PROXIMAL HUMERUS FRACTURE w/ #2 fberwire;  Surgeon: Corky Mull, MD;  Location: [facility];  Service: Orthopedics;  Laterality: Right;    There were no vitals filed for this visit.      Subjective Assessment - 11/21/15 1040    Subjective Patient reports no pain or discomfort in the right shoulder. States the right shoulder is much improved but the left shoulder has increased pain when raising her arm overhead.   Limitations House hold activities;Lifting;Other (comment)   Patient Stated Goals Would like to be able to drive and use R shoulder without pain.    Currently in Pain? No/denies             Objective:   Observation: Inferior angle of the scapula at equal bilaterally Palpation: On left: Tender to palpation with increased muscle guarding/spasms: supraspinatus, upper trap, scapular external rotators, and pectoralis major AROM: On the right: Shoulder: Flexion (in sitting): WNL, ER/IR: WNL; On the Left: Shoulder: Flexion (in sitting): 135 -- pain at end range, ER/IR: 45 degrees; Cervical: flex: 45, ext: 30, right SB: 30, left SB: 40, Left rotation: 50 ,  right rotation: 50  Objective Measures: NDI: 4% self perceived impairment (minimal dysfunction) , QuickDASH: 18% self perceived impairment (Minimal dysfunction) -- for right shoulder  Hand-grip dynamometry: R: 40lbs, L:40lbs    Treatment: Therapeutic Exercise:  Patient performed exercises with guidance, verbal and tactile cues and demonstration of therapist: AAROM shoulder flexion in supine -- 2 x 10 ( To chin, nose, forehead) AROM in sitting bilaterally -- x5   Manual therapy: Soft tissue mobilization to pectoralis major, scapular external rotators, and upper traps ultilizing superficial and  deep techniques to decrease muscle spasms and guarding with patient positioned in supine and sitting.   Special tests: (+): Neer's, empty can, cross-arm adduction, Michel Bickers on Left, Not tested on right  Response to Treatment: No increase in right shoulder pain throughout treatment session. Increase in L shoulder pain when performing L shoulder flexion; pain is decreased after performing STM to the pectoralis major and upper traps.           PT Education - 11/21/15 1042    Education provided Yes   Education Details Educated on continuing to perform exercises at home to maintain functional gains acheived in therapy.   Person(s) Educated Patient   Methods Explanation   Comprehension Verbalized understanding             PT Long Term Goals - 11/21/15 1044    PT LONG TERM GOAL #1   Title Patient will present with an NDI score of <14% impaired by 11/24/15 to show significant improvement with neck function to improve head turning for driving.   Baseline 24% impaired ( 11/21/2015 4% impairment)   Status Achieved   PT LONG TERM GOAL #2   Title Patient will present with an QuickDASH score of <20% impaired by 11/24/15 to show significant improvement with shoulder function to improve ability to chop food while cooking.   Baseline 51% Impaired ( 11/21/2015 18% impaired)   Status Achieved   PT LONG TERM GOAL #3   Title Patient will be independent with HEP focusing on increasing muscular endurance, coordination, and strength by 11/24/15 to progress tissue healing after therapy is complete.   Baseline limited knowledge of pain control and appropriate progression of exercises (11/21/15 Independent with exercise performance and progression)   Status Achieved               Plan - 11/21/15 1109    Clinical Impression Statement Patient has met all long term goals demonstrating significant improvement in QuickDASH, NDI, and R shoulder strength and endurance. Patient demonstrates ability  to perform exercises without cueing for tehcnique or progression and is to be discharged from therapy for treatment of right shoulder.   Rehab Potential Good   Clinical Impairments Affecting Rehab Potential (+) highly motivated, acute condition, active prior level of function; (-) previous discectomy affecting right side, Increase shoulder pain on the left side.    PT Frequency 2x / week   PT Duration 6 weeks   PT Treatment/Interventions Passive range of motion;Neuromuscular re-education;Manual techniques;ADLs/Self Care Home Management;Ultrasound;Electrical Stimulation;Iontophoresis 32m/ml Dexamethasone;Moist Heat;Therapeutic exercise;Therapeutic activities;Patient/family education   PT Next Visit Plan Advance scapular stabilization and ROM/strengthening exercises, pain control   PT Home Exercise Plan Scapular retractions, AAROM shoulder flexion in supine, sidelying shoulder ER,  serratus punches, serratus punches, weighted shoulder flexion.    Consulted and Agree with Plan of Care Patient      Patient will benefit from skilled therapeutic intervention in order to improve the following deficits and impairments:  Decreased range  of motion, Increased fascial restricitons, Impaired UE functional use, Increased muscle spasms, Decreased endurance, Decreased activity tolerance, Pain, Impaired flexibility, Hypomobility, Decreased strength, Impaired sensation  Visit Diagnosis: Muscle weakness (generalized)  Pain in right shoulder     Problem List Patient Active Problem List   Diagnosis Date Noted  . 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    Blythe Stanford, SPT 11/21/2015, 12:11 PM  [facility] [address], [name], [address] Phone: [phone]   Fax:  [phone]  Name: Laura Hale MRN: [id number] Date of Birth: 06/24/1946

## 2015-11-23 ENCOUNTER — Ambulatory Visit: Payer: PPO | Admitting: Physical Therapy

## 2015-11-23 ENCOUNTER — Encounter: Payer: Self-pay | Admitting: Physical Therapy

## 2015-11-23 DIAGNOSIS — R29898 Other symptoms and signs involving the musculoskeletal system: Secondary | ICD-10-CM | POA: Diagnosis not present

## 2015-11-23 DIAGNOSIS — M25512 Pain in left shoulder: Secondary | ICD-10-CM

## 2015-11-23 DIAGNOSIS — M6281 Muscle weakness (generalized): Secondary | ICD-10-CM

## 2015-11-23 DIAGNOSIS — M25561 Pain in right knee: Secondary | ICD-10-CM

## 2015-11-23 NOTE — Therapy (Addendum)
[place] [address], [name], [address] Phone: [phone]   Fax:  [phone]  Physical Therapy Treatment  Patient Details  Name: Laura Hale MRN: [id number] Date of Birth: [birth year] Referring Provider: Milagros Evener, MD  Encounter Date: 11/23/2015      PT End of Session - 11/23/15 1639    Visit Number 1   Number of Visits 12   Date for PT Re-Evaluation 01/04/16   Authorization Type 1   Authorization Time Period 10 (G Code)   PT Start Time 0900   PT Stop Time [id number]   PT Time Calculation (min) 44 min   Activity Tolerance No increased pain;Patient tolerated treatment well   Behavior During Therapy [facility] for tasks assessed/performed      Past Medical History  Diagnosis Date  . Arthritis     elbow, hands, neck   . Atypical childhood psychosis 2007    myoview -negative for ischemia with preserved LV function  . Colon polyps   . Hypercalcemia 2009    calcium  WNL 10.2 on 04-2015  . Hyperlipidemia   . Obesity   . Urinary incontinence   . Asthma     allergy induced asthma-only exacerbated by smells like perfume, smoke etc  . Complication of anesthesia     WITH ACDF IN JUNE 2016-PT STATES SHE LOST HER VOICE X 10 WEEKS DUE TO INTUBATION    Past Surgical History  Procedure Laterality Date  . Elbow surgery Left 04/2003    x2  & for repair & then remove all hardware, due to injury from a fall  . Hand surgery Right 2006    injury- then eventually lost remainder of thumb  . Abdominal hysterectomy    . Anterior cervical decomp/discectomy fusion N/A 01/31/2015    Procedure: ANTERIOR CERVICAL DECOMPRESSION/DISCECTOMY FUSION CERVICAL FIVE SIX;  Surgeon: Karie Chimera, MD;  Location: [place] [facility];  Service: Neurosurgery;  Laterality: N/A;  . Colonoscopy    . 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 Orif humerus fracture Right 08/31/2015    Procedure: OPEN REDUCTION INTERNAL FIXATION (ORIF) PROXIMAL HUMERUS  FRACTURE w/ #2 fberwire;  Surgeon: Corky Mull, MD;  Location: [facility];  Service: Orthopedics;  Laterality: Right;    There were no vitals filed for this visit.      Subjective Assessment - 11/23/15 0913    Subjective Patient reports increased Left shoulder pain and stiffness starting 12/11/14. S/p frozen shoulder release and elbow ORIF in 2004. States difficulty with reaching, lifting arm overhead, grip strength and washing her hair. Rest helps decrease pain in left shoulder. Knee pain starts 08/19/15 after a fall on ice fell on both knees. States increased pain with bending to lift objects and squatting.    Limitations House hold activities;Lifting;Other (comment)   How long can you stand comfortably? 5 min   How long can you walk comfortably? 22min   Patient Stated Goals Would like to be able to drive and use R shoulder without pain.    Currently in Pain? Yes   Pain Score 5   L shoulder 5/10   Pain Location Knee   Pain Orientation Right   Pain Descriptors / Indicators Aching;Sharp;Tightness  shoulder: stiffness, aching   Pain Type Acute pain            [facility] PT Assessment - 11/23/15 0909    Assessment   Medical Diagnosis Osteoarthritis right knee, complex tear of meniscus  right knee, tendonitis left shoulder   Referring Provider Milagros Evener, MD   Onset Date/Surgical Date 08/19/15  12/11/2014 -- left shoulder ongoing   Hand Dominance Right   Next MD Visit unknown   Prior Therapy no   Balance Screen   Has the patient fallen in the past 6 months No   Has the patient had a decrease in activity level because of a fear of falling?  No   Is the patient reluctant to leave their home because of a fear of falling?  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 Edema reported on bilateral knees (More on  right versus left) Gait: mild decrease in weight shift to right LE with stance, decreased trunk rotation, decreased step length right LE as compared to left Palpation: On left: Tender to palpation with increased muscle guarding/spasms: supraspinatus, upper trap, scapular external rotators, and pectoralis major. Increased tenderness to palpation along quads and medial hamstrings on the right knee. AROM: On the right: Shoulder: Flexion (in sitting): WNL, ER/IR: WNL; On the Left: Shoulder: Flexion (in sitting): 135 -- pain at end range, ER/IR: 45 degrees; Cervical: flex: 45, ext: 30, right SB: 30, left SB: 40, Left rotation: 50, right rotation: 50 R Knee AROM/MMT: Flexion:105; 4-/5, Extension:10; 5/5 from neutral, L Knee AROM/MMT: Flexion:105; 4-/5; 115, Extension: 5; 5/5 R Hip AROM/MMT:  ABD:WNL;4-/5, ER:WNL;4/5 L Hip AROM/MMT:  ABD:WNL;4-/5; , ER:WNL; 4-/5  Objective Measures: NDI: 4% self perceived impairment (minimal dysfunction) , QuickDASH: 43% self perceived impairment (Minimal dysfunction) -- for left shoulder ; LEFS: 26/64 for right knee Hand-grip dynamometry: R: 40lbs, L:40lbs   5x sit to stand: 17.5 seconds   Treatment: Therapeutic Exercise:   Clamshells in sidelying -- bilaterally x 10 Ball/glute squeezes in sitting -- x10 SLR on right in supine -- x10 Quad sets in supine -- x 10  Manual therapy: Soft tissue mobilization to pectoralis major, scapular external rotators, and upper traps ultilizing superficial and deep techniques to decrease muscle spasms and guarding with patient positioned in supine and sitting.   Special tests: (+): Neer's, empty can, cross-arm adduction, Michel Bickers on Left, Not tested on right (+) knee varus, valgus on right, (-) knee varus, valgus on left   Response to Treatment: Decreased knee pain post performing mobility exercises indicating improved motor control and muscle guarding. Improved knee mobility after performing knee AROM exercises. Good  demonstration of exercises for LE's following demonstration and with verbal cues         PT Education - 11/23/15 1507    Education provided Yes   Education Details HEP: clamshells, ball roll outs in sitting, glute/ball squeezes, SLRs   Person(s) Educated Patient   Methods Explanation   Comprehension Verbalized understanding             PT Long Term Goals - 11/23/15 1631    PT LONG TERM GOAL #1   Title Patient will present with an NDI score of <14% impaired by 11/24/15 to show significant improvement with neck function to improve head turning for driving.   Baseline 24% impaired ( 11/21/2015 4% impairment)   Status Achieved   PT LONG TERM GOAL #2   Title Patient will present with an QuickDASH score of <20% impaired by 11/24/15 to show significant improvement with shoulder function to improve  ability to chop food while cooking.   Baseline 51% Impaired ( 11/21/2015 18% impaired)   Status Achieved   PT LONG TERM GOAL #3   Title Patient will be independent with HEP focusing on increasing muscular endurance, coordination, and strength by 11/24/15 to progress tissue healing after therapy is complete.   Baseline limited knowledge of pain control and appropriate progression of exercises (11/21/15 Independent with exercise performance and progression)   Status Achieved   PT LONG TERM GOAL #4   Title Patient will present with an QuickDASH score of <20% impaired by 01/04/16 to show significant improvement with Left shoulder function to improve ability to prepare food.   Baseline QuickDASH: 43% (moderate disability)   Status New   PT LONG TERM GOAL #5   Title Patient will present with a LEFS score of 40/64 by 01/04/16 to demonstrate significant improvement in lower extremity function and improved ability to perform prolonged standing/   Baseline LEFS: 26/64 (moderate-severe disability)   Status New   Additional Long Term Goals   Additional Long Term Goals Yes   PT LONG TERM GOAL #6   Title  Patient will be independent with HEP focusing on increasing muscular endurance, coordination, and strength by 01/04/16 to progress tissue healing after therapy is complete.   Baseline Dependent with exercise performance technique and progression.   Status New               Plan - 11/23/15 1642    Clinical Impression Statement Patient is a 70 yo female presenting with right knee pain and stiffness and L shoulder pain and stiffness. Decreased QuickDASH and LEFS score indicate significant decrease in shoulder and lower extremiity function. Patient also demonstrates significant decrease in motor control,  muscular endurance/strength, and decreased  AROM of shoulder and affected knee. Pt will benefit from skilled therapy to improve limitations and return to prior level of function.     Rehab Potential Good   Clinical Impairments Affecting Rehab Potential (+) highly motivated, acute condition, active prior level of function; (-) previous discectomy affecting right side, Increase shoulder pain on the left side.    PT Frequency 2x / week   PT Duration 6 weeks   PT Treatment/Interventions Passive range of motion;Neuromuscular re-education;Manual techniques;ADLs/Self Care Home Management;Ultrasound;Electrical Stimulation;Iontophoresis 4mg /ml Dexamethasone;Moist Heat;Therapeutic exercise;Therapeutic activities;Patient/family education   PT Next Visit Plan Advance scapular stabilization and ROM/strengthening exercises, pain control, LE strengthening/ROM   PT Home Exercise Plan Scapular retractions, AAROM shoulder flexion in supine, sidelying shoulder ER,  serratus punches, serratus punches, weighted shoulder flexion: LE ROM with ball under foot, isometric hip adduction with ball, abduction with resistive band   Consulted and Agree with Plan of Care Patient      Patient will benefit from skilled therapeutic intervention in order to improve the following deficits and impairments:  Decreased range of  motion, Increased fascial restricitons, Impaired UE functional use, Increased muscle spasms, Decreased endurance, Decreased activity tolerance, Pain, Impaired flexibility, Hypomobility, Decreased strength, Impaired sensation  Visit Diagnosis: Muscle weakness (generalized)  Pain in left shoulder  Pain in right knee     Problem List Patient Active Problem List   Diagnosis Date Noted  . 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    Blythe Stanford, SPT 11/23/2015, 4:49 PM  [facility] [address] [name], [address] Phone: [phone]   Fax:  [phone]  Name: Laura Hale MRN: [id number] Date of Birth: Oct 09, 1945

## 2015-11-28 ENCOUNTER — Encounter: Payer: Self-pay | Admitting: Physical Therapy

## 2015-11-28 ENCOUNTER — Ambulatory Visit: Payer: PPO | Admitting: Physical Therapy

## 2015-11-28 DIAGNOSIS — M25511 Pain in right shoulder: Secondary | ICD-10-CM

## 2015-11-28 DIAGNOSIS — M25561 Pain in right knee: Secondary | ICD-10-CM

## 2015-11-28 DIAGNOSIS — R29898 Other symptoms and signs involving the musculoskeletal system: Secondary | ICD-10-CM | POA: Diagnosis not present

## 2015-11-28 DIAGNOSIS — M6281 Muscle weakness (generalized): Secondary | ICD-10-CM

## 2015-11-28 DIAGNOSIS — M25512 Pain in left shoulder: Secondary | ICD-10-CM

## 2015-11-28 NOTE — Therapy (Signed)
[name] [name], [address] Phone: [phone]   Fax:  [phone]  Physical Therapy Treatment  Patient Details  Name: Laura Hale MRN: [id number] Date of Birth: 08/18/1945 Referring Provider: Milagros Evener, MD  Encounter Date: 11/28/2015      PT End of Session - 11/28/15 1135    Visit Number 2   Number of Visits 12   Date for PT Re-Evaluation 01/04/16   Authorization Type 2   Authorization Time Period 10 (G Code)   PT Start Time 1020   PT Stop Time 1100   PT Time Calculation (min) 40 min   Activity Tolerance No increased pain;Patient tolerated treatment well   Behavior During Therapy [facility] for tasks assessed/performed      Past Medical History  Diagnosis Date  . Arthritis     elbow, hands, neck   . Atypical childhood psychosis 2007    myoview -negative for ischemia with preserved LV function  . Colon polyps   . Hypercalcemia 2009    calcium  WNL 10.2 on 04-2015  . Hyperlipidemia   . Obesity   . Urinary incontinence   . Asthma     allergy induced asthma-only exacerbated by smells like perfume, smoke etc  . Complication of anesthesia     WITH ACDF IN JUNE 2016-PT STATES SHE LOST HER VOICE X 10 WEEKS DUE TO INTUBATION    Past Surgical History  Procedure Laterality Date  . Elbow surgery Left 04/2003    x2  & for repair & then remove all hardware, due to injury from a fall  . Hand surgery Right 2006    injury- then eventually lost remainder of thumb  . Abdominal hysterectomy    . Anterior cervical decomp/discectomy fusion N/A 01/31/2015    Procedure: ANTERIOR CERVICAL DECOMPRESSION/DISCECTOMY FUSION CERVICAL FIVE SIX;  Surgeon: Karie Chimera, MD;  Location: [facility];  Service: Neurosurgery;  Laterality: N/A;  . Colonoscopy    . 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 Orif humerus fracture Right 08/31/2015    Procedure: OPEN REDUCTION INTERNAL FIXATION (ORIF) PROXIMAL HUMERUS  FRACTURE w/ #2 fberwire;  Surgeon: Corky Mull, MD;  Location: [facility];  Service: Orthopedics;  Laterality: Right;    There were no vitals filed for this visit.      Subjective Assessment - 11/28/15 0922    Subjective Patient reports decreased pain in the right knee and left shoulder today. States increased stifness when trying to don her earing with her left shoulder.    Limitations House hold activities;Lifting;Other (comment)   How long can you stand comfortably? 5 min   How long can you walk comfortably? 37min   Patient Stated Goals Would like to be able to drive and use R shoulder without pain.    Pain Score 2   L shoudler: 2/10   Pain Location Knee   Pain Orientation Right   Pain Descriptors / Indicators Aching;Tightness;Sharp   Pain Type Acute pain   Pain Onset More than a month ago      Objective: Measurement: L Shoulder flexion: 150 (Improved to 160 after treatment) Palpation: Increased guarding and spasms along pecs, upper trap, and scapular retractors  Treatment: Therapeutic Exercise:  Supine bilateral shoulder flexion -- 2x 10  Sidelying shoulder ER on right with towel under arm -- x15 Sidelying scapular elevation and depression (Resistance applied to downward scapular motion) Serratus punches in supine -- x20  Manual therapy: Soft tissue mobilization to pectoralis major, scapular retractors, and upper traps ultilizing superficial and deep techniques to decrease muscle spasms and guarding with patient positioned in supine and sitting. Passive accessories inferiorly and posteriorly to the GHJ -- 2 x 30sec grade III to improve joint elasticity with patient positioned in supine.  Assisted scapular downward rotation with patient positioned in sidelying-- 2 x 15   Response to Treatment: Decreased shoulder pain and increased ROM after performing manual therapy and therapeutic exercise. Good demonstration of muscular activation when performing shoulder ER requiring  minimal tactile cueing to perform.          PT Education - 11/28/15 1135    Education provided Yes   Education Details HEP: supine overhhead flexion, shoulder ER on her side, scapular elevation/depression   Person(s) Educated Patient   Methods Explanation;Demonstration   Comprehension Verbalized understanding;Returned demonstration             PT Long Term Goals - 11/23/15 1631    PT LONG TERM GOAL #1   Title Patient will present with an NDI score of <14% impaired by 11/24/15 to show significant improvement with neck function to improve head turning for driving.   Baseline 24% impaired ( 11/21/2015 4% impairment)   Status Achieved   PT LONG TERM GOAL #2   Title Patient will present with an QuickDASH score of <20% impaired by 11/24/15 to show significant improvement with shoulder function to improve ability to chop food while cooking.   Baseline 51% Impaired ( 11/21/2015 18% impaired)   Status Achieved   PT LONG TERM GOAL #3   Title Patient will be independent with HEP focusing on increasing muscular endurance, coordination, and strength by 11/24/15 to progress tissue healing after therapy is complete.   Baseline limited knowledge of pain control and appropriate progression of exercises (11/21/15 Independent with exercise performance and progression)   Status Achieved   PT LONG TERM GOAL #4   Title Patient will present with an QuickDASH score of <20% impaired by 01/04/16 to show significant improvement with Left shoulder function to improve ability to prepare food.   Baseline QuickDASH: 43% (moderate disability)   Status New   PT LONG TERM GOAL #5   Title Patient will present with a LEFS score of 40/64 by 01/04/16 to demonstrate significant improvement in lower extremity function and improved ability to perform prolonged standing/   Baseline LEFS: 26/64 (moderate-severe disability)   Status New   Additional Long Term Goals   Additional Long Term Goals Yes   PT LONG TERM GOAL #6    Title Patient will be independent with HEP focusing on increasing muscular endurance, coordination, and strength by 01/04/16 to progress tissue healing after therapy is complete.   Baseline Dependent with exercise performance technique and progression.   Status New               Plan - 11/28/15 1148    Clinical Impression Statement Patient making progreess towards long term goals demonstrating improved shoulder flexion AROM and decreased pain upon arriving to appointment indicating functional carryover between visits. Patient continues with shoulder weakness and impaired motor control during shoulder flexion and will benefit from further skilled therapy to return to prior level of function.    Rehab Potential Good   Clinical Impairments Affecting Rehab Potential (+) highly motivated, acute condition, active prior level of function; (-) previous discectomy affecting right side, Increase shoulder pain on the left side.    PT Frequency 2x /  week   PT Duration 6 weeks   PT Treatment/Interventions Passive range of motion;Neuromuscular re-education;Manual techniques;ADLs/Self Care Home Management;Ultrasound;Electrical Stimulation;Iontophoresis 4mg /ml Dexamethasone;Moist Heat;Therapeutic exercise;Therapeutic activities;Patient/family education   PT Next Visit Plan Advance scapular stabilization and ROM/strengthening exercises, pain control   PT Home Exercise Plan Scapular retractions, AAROM shoulder flexion in supine, sidelying shoulder ER,  serratus punches, serratus punches, weighted shoulder flexion.    Consulted and Agree with Plan of Care Patient      Patient will benefit from skilled therapeutic intervention in order to improve the following deficits and impairments:  Decreased range of motion, Increased fascial restricitons, Impaired UE functional use, Increased muscle spasms, Decreased endurance, Decreased activity tolerance, Pain, Impaired flexibility, Hypomobility, Decreased strength,  Impaired sensation  Visit Diagnosis: Muscle weakness (generalized)  Pain in left shoulder  Pain in right knee  Pain in right shoulder     Problem List Patient Active Problem List   Diagnosis Date Noted  . 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    Blythe Stanford, SPT 11/28/2015, 11:50 AM  [facility] [address] [name], [address] Phone: [phone]   Fax:  [phone]  Name: Laura Hale MRN: [id number] Date of Birth: May 07, 1946

## 2015-12-01 ENCOUNTER — Ambulatory Visit: Payer: PPO | Admitting: Physical Therapy

## 2015-12-05 ENCOUNTER — Ambulatory Visit: Payer: PPO | Admitting: Physical Therapy

## 2015-12-05 ENCOUNTER — Encounter: Payer: Self-pay | Admitting: Physical Therapy

## 2015-12-05 DIAGNOSIS — R29898 Other symptoms and signs involving the musculoskeletal system: Secondary | ICD-10-CM | POA: Diagnosis not present

## 2015-12-05 DIAGNOSIS — M25512 Pain in left shoulder: Secondary | ICD-10-CM

## 2015-12-05 DIAGNOSIS — M6281 Muscle weakness (generalized): Secondary | ICD-10-CM

## 2015-12-05 DIAGNOSIS — M25561 Pain in right knee: Secondary | ICD-10-CM

## 2015-12-05 NOTE — Therapy (Addendum)
[facility] [address], [name], [address] Phone: [phone]   Fax:  [phone]  Physical Therapy Treatment  Patient Details  Name: Laura Hale MRN: [id number] Date of Birth: Oct 25, 1945 Referring Provider: Milagros Evener, MD  Encounter Date: 12/05/2015      PT End of Session - 12/05/15 0948    Visit Number 3   Number of Visits 12   Date for PT Re-Evaluation 01/04/16   Authorization Type 3   Authorization Time Period 10 (G Code)   PT Start Time 0900   PT Stop Time 0949   PT Time Calculation (min) 49 min   Activity Tolerance No increased pain;Patient tolerated treatment well   Behavior During Therapy [facility] for tasks assessed/performed      Past Medical History  Diagnosis Date  . Arthritis     elbow, hands, neck   . Atypical childhood psychosis 2007    myoview -negative for ischemia with preserved LV function  . Colon polyps   . Hypercalcemia 2009    calcium  WNL 10.2 on 04-2015  . Hyperlipidemia   . Obesity   . Urinary incontinence   . Asthma     allergy induced asthma-only exacerbated by smells like perfume, smoke etc  . Complication of anesthesia     WITH ACDF IN JUNE 2016-PT STATES SHE LOST HER VOICE X 10 WEEKS DUE TO INTUBATION    Past Surgical History  Procedure Laterality Date  . Elbow surgery Left 04/2003    x2  & for repair & then remove all hardware, due to injury from a fall  . Hand surgery Right 2006    injury- then eventually lost remainder of thumb  . Abdominal hysterectomy    . Anterior cervical decomp/discectomy fusion N/A 01/31/2015    Procedure: ANTERIOR CERVICAL DECOMPRESSION/DISCECTOMY FUSION CERVICAL FIVE SIX;  Surgeon: Karie Chimera, MD;  Location: [facility];  Service: Neurosurgery;  Laterality: N/A;  . Colonoscopy    . 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 Orif humerus fracture Right 08/31/2015    Procedure: OPEN REDUCTION INTERNAL FIXATION (ORIF) PROXIMAL HUMERUS  FRACTURE w/ #2 fberwire;  Surgeon: Corky Mull, MD;  Location: [facility];  Service: Orthopedics;  Laterality: Right;    There were no vitals filed for this visit.      Subjective Assessment - 12/05/15 0903    Subjective Patient reports her shoulder has been feeling better since the previous visit. Saying she's sleeping better saying she is awoken by the pain less throuhgout the night but states she needs to take an NSAID to sleep throughout the night. States she was able to clean her house over the weekend, which she has not been able to do since the onset of the injury. Patient states she's experienced  acute aggravation of symptoms yesterday when attempting to pull her onto a bus.    Limitations House hold activities;Lifting;Other (comment)   How long can you stand comfortably? 5 min   How long can you walk comfortably? 61min   Patient Stated Goals Would like to be able to drive and use R shoulder without pain.    Currently in Pain? Yes   Pain Score 2    Pain Location Shoulder   Pain Orientation Left   Pain Descriptors / Indicators Aching   Pain Type Acute pain   Pain Onset More than a month ago      Objective: Measurement: L Shoulder flexion:  160  Palpation: Increased guarding and significant spasms along L pec, upper trap, and latissimus dorsi  Treatment: Therapeutic Exercise:  Supine bilateral shoulder flexion -- 2x 10  Sidelying shoulder ER on right with towel under arm -- 2x15 #1 Serratus punches in supine -- 2x15  Manual therapy: Soft tissue mobilization to pectoralis major, latissimus dorsi, and upper traps ultilizing superficial and deep techniques to decrease muscle spasms and guarding with patient positioned in supine and sitting. Passive accessories inferiorly, anteriorly, and posteriorly to the GHJ -- 2 x 30sec grade III to improve joint elasticity with patient positioned in supine.   Modalities: Moist cervical heat pack placed along left shoulder with patient  place in supine  For 10 minutes with knees supported with a bolster to decreased pain and muscle guarding.   Response to Treatment: Decreased shoulder pain and increased ROM after performing manual therapy, therapeutic exercise, and modalities. Good demonstration of muscular activation when performing shoulder ER requiring minimal tactile cueing to perform with appropriate muscle activation.         PT Long Term Goals - 11/23/15 1631    PT LONG TERM GOAL #1   Title Patient will present with an NDI score of <14% impaired by 11/24/15 to show significant improvement with neck function to improve head turning for driving.   Baseline 24% impaired ( 11/21/2015 4% impairment)   Status Achieved   PT LONG TERM GOAL #2   Title Patient will present with an QuickDASH score of <20% impaired by 11/24/15 to show significant improvement with shoulder function to improve ability to chop food while cooking.   Baseline 51% Impaired ( 11/21/2015 18% impaired)   Status Achieved   PT LONG TERM GOAL #3   Title Patient will be independent with HEP focusing on increasing muscular endurance, coordination, and strength by 11/24/15 to progress tissue healing after therapy is complete.   Baseline limited knowledge of pain control and appropriate progression of exercises (11/21/15 Independent with exercise performance and progression)   Status Achieved   PT LONG TERM GOAL #4   Title Patient will present with an QuickDASH score of <20% impaired by 01/04/16 to show significant improvement with Left shoulder function to improve ability to prepare food.   Baseline QuickDASH: 43% (moderate disability)   Status New   PT LONG TERM GOAL #5   Title Patient will present with a LEFS score of 40/64 by 01/04/16 to demonstrate significant improvement in lower extremity function and improved ability to perform prolonged standing/   Baseline LEFS: 26/64 (moderate-severe disability)   Status New   Additional Long Term Goals   Additional  Long Term Goals Yes   PT LONG TERM GOAL #6   Title Patient will be independent with HEP focusing on increasing muscular endurance, coordination, and strength by 01/04/16 to progress tissue healing after therapy is complete.   Baseline Dependent with exercise performance technique and progression.   Status New               Plan - 12/05/15 1011    Clinical Impression Statement Focused on decreasing symptoms and pain secondary to patient experiencing aggravated symptoms from pulling herself onto a bus the day before. Decreased pain at the end of therapy indicated improved muscle coordination and tissue elasticity. Patient will continue to benefit from further skilled therapy to return to prior level of function.    Rehab Potential Good   Clinical Impairments Affecting Rehab Potential (+) highly motivated, acute condition, active prior level of function; (-)  previous discectomy affecting right side, Increase shoulder pain on the left side.    PT Frequency 2x / week   PT Duration 6 weeks   PT Treatment/Interventions Passive range of motion;Neuromuscular re-education;Manual techniques;ADLs/Self Care Home Management;Ultrasound;Electrical Stimulation;Iontophoresis 4mg /ml Dexamethasone;Moist Heat;Therapeutic exercise;Therapeutic activities;Patient/family education   PT Next Visit Plan Advance scapular stabilization and ROM/strengthening exercises, pain control   PT Home Exercise Plan Scapular retractions, AAROM shoulder flexion in supine, sidelying shoulder ER,  serratus punches, serratus punches, weighted shoulder flexion.    Consulted and Agree with Plan of Care Patient      Patient will benefit from skilled therapeutic intervention in order to improve the following deficits and impairments:  Decreased range of motion, Increased fascial restricitons, Impaired UE functional use, Increased muscle spasms, Decreased endurance, Decreased activity tolerance, Pain, Impaired flexibility, Hypomobility,  Decreased strength, Impaired sensation  Visit Diagnosis: Muscle weakness (generalized)  Pain in left shoulder  Pain in right knee     Problem List Patient Active Problem List   Diagnosis Date Noted  . 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    Blythe Stanford, SPT 12/05/2015, 10:29 AM  [facility] [address] [name], [address] Phone: [phone]   Fax:  [phone]  Name: ELANNA PILLARD MRN: [id number] Date of Birth: May 29, 1946

## 2015-12-07 ENCOUNTER — Encounter: Payer: Self-pay | Admitting: Physical Therapy

## 2015-12-07 ENCOUNTER — Ambulatory Visit: Payer: PPO | Admitting: Physical Therapy

## 2015-12-07 DIAGNOSIS — M25512 Pain in left shoulder: Secondary | ICD-10-CM

## 2015-12-07 DIAGNOSIS — M6281 Muscle weakness (generalized): Secondary | ICD-10-CM

## 2015-12-07 DIAGNOSIS — R29898 Other symptoms and signs involving the musculoskeletal system: Secondary | ICD-10-CM | POA: Diagnosis not present

## 2015-12-07 DIAGNOSIS — M25561 Pain in right knee: Secondary | ICD-10-CM

## 2015-12-07 NOTE — Therapy (Signed)
[facility] [address], [name], [address] Phone: [phone]   Fax:  [phone]  Physical Therapy Treatment  Patient Details  Name: Laura Hale MRN: [id number] Date of Birth: January 13, 1946 Referring Provider: Milagros Evener, MD  Encounter Date: 12/07/2015      PT End of Session - 12/07/15 1256    Visit Number 4   Number of Visits 12   Date for PT Re-Evaluation 01/04/16   Authorization Type 4   Authorization Time Period 10 (G Code)   PT Start Time 0930   PT Stop Time [id number]   PT Time Calculation (min) 44 min   Activity Tolerance No increased pain;Patient tolerated treatment well   Behavior During Therapy [facility] for tasks assessed/performed      Past Medical History  Diagnosis Date  . Arthritis     elbow, hands, neck   . Atypical childhood psychosis 2007    myoview -negative for ischemia with preserved LV function  . Colon polyps   . Hypercalcemia 2009    calcium  WNL 10.2 on 04-2015  . Hyperlipidemia   . Obesity   . Urinary incontinence   . Asthma     allergy induced asthma-only exacerbated by smells like perfume, smoke etc  . Complication of anesthesia     WITH ACDF IN JUNE 2016-PT STATES SHE LOST HER VOICE X 10 WEEKS DUE TO INTUBATION    Past Surgical History  Procedure Laterality Date  . Elbow surgery Left 04/2003    x2  & for repair & then remove all hardware, due to injury from a fall  . Hand surgery Right 2006    injury- then eventually lost remainder of thumb  . Abdominal hysterectomy    . Anterior cervical decomp/discectomy fusion N/A 01/31/2015    Procedure: ANTERIOR CERVICAL DECOMPRESSION/DISCECTOMY FUSION CERVICAL FIVE SIX;  Surgeon: Karie Chimera, MD;  Location: [facility];  Service: Neurosurgery;  Laterality: N/A;  . Colonoscopy    . 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 Orif humerus fracture Right 08/31/2015    Procedure: OPEN REDUCTION INTERNAL FIXATION (ORIF) PROXIMAL HUMERUS  FRACTURE w/ #2 fberwire;  Surgeon: Corky Mull, MD;  Location: [facility];  Service: Orthopedics;  Laterality: Right;    There were no vitals filed for this visit.      Subjective Assessment - 12/07/15 0933    Subjective Patient reports shoulder felt much better after the previous treatment and states the right knee has been feeling stiff and states she isn't able to walk across [employer] without feeling intense pain.    Limitations House hold activities;Lifting;Other (comment)   How long can you stand comfortably? 5 min   How long can you walk comfortably? 35min   Patient Stated Goals Would like to be able to drive and use R shoulder without pain.    Currently in Pain? Yes   Pain Score 2    Pain Location Knee   Pain Orientation Left   Pain Descriptors / Indicators Aching   Pain Type Acute pain   Pain Onset More than a month ago      Objective: Measurement: 10 degrees from full knee ext on R; knee flexion:  Palpation: Increased guarding and significant spasms along vastus lateralis and medial hamstring tendons.   Treatment: Therapeutic Exercise: Pec stretch in open doorway -- 20sec hold  x 2 on L Bridges with ball squeezes for hip adduction -- x 15 Sit to  stand with ball/glute squeezes -- x15 Resisted hamstring isometrics in sitting between 90-60 -- x15 Knees to chest in supine with yellow physioball underneath feet -- x15 SLR in supine -- x15 Quad sets in long sitting -- x15  Manual therapy: Soft tissue mobilization to quadriceps and hamstrings (More on semitendinosus/semimembranosis vs biceps femoris) ultilizing superficial and deep techniques to decrease muscle spasms and guarding with patient positioned in long sitting.   Response to Treatment: Decreased knee pain post performing soft tissue mobilization indicating improved tissue elasticity and muscle spasms. Good demonstration of technique with sit to stands exercise requiring minimal verbal cueing to activate gluteal  musculature.          PT Education - 12/07/15 1256    Education provided Yes   Education Details HEP: pec stretch, sit to stands with ball squeezes   Person(s) Educated Patient   Methods Explanation;Demonstration   Comprehension Verbalized understanding;Returned demonstration             PT Long Term Goals - 11/23/15 1631    PT LONG TERM GOAL #1   Title Patient will present with an NDI score of <14% impaired by 11/24/15 to show significant improvement with neck function to improve head turning for driving.   Baseline 24% impaired ( 11/21/2015 4% impairment)   Status Achieved   PT LONG TERM GOAL #2   Title Patient will present with an QuickDASH score of <20% impaired by 11/24/15 to show significant improvement with shoulder function to improve ability to chop food while cooking.   Baseline 51% Impaired ( 11/21/2015 18% impaired)   Status Achieved   PT LONG TERM GOAL #3   Title Patient will be independent with HEP focusing on increasing muscular endurance, coordination, and strength by 11/24/15 to progress tissue healing after therapy is complete.   Baseline limited knowledge of pain control and appropriate progression of exercises (11/21/15 Independent with exercise performance and progression)   Status Achieved   PT LONG TERM GOAL #4   Title Patient will present with an QuickDASH score of <20% impaired by 01/04/16 to show significant improvement with Left shoulder function to improve ability to prepare food.   Baseline QuickDASH: 43% (moderate disability)   Status New   PT LONG TERM GOAL #5   Title Patient will present with a LEFS score of 40/64 by 01/04/16 to demonstrate significant improvement in lower extremity function and improved ability to perform prolonged standing/   Baseline LEFS: 26/64 (moderate-severe disability)   Status New   Additional Long Term Goals   Additional Long Term Goals Yes   PT LONG TERM GOAL #6   Title Patient will be independent with HEP focusing on  increasing muscular endurance, coordination, and strength by 01/04/16 to progress tissue healing after therapy is complete.   Baseline Dependent with exercise performance technique and progression.   Status New               Plan - 12/07/15 1314    Clinical Impression Statement Focused on treating knee symptoms today as patient complained of increased aggravation when ambulating. Patient demonstrates increased muscle guarding and spasms along the quadriceps and hamstrings indicating decreased motor control and decreased muscular endurance in the R LE. Patient demonstrates decreased motor control and endurance with exercises and will benefit from further skilled therapy to return to prior level of function.    Rehab Potential Good   Clinical Impairments Affecting Rehab Potential (+) highly motivated, acute condition, active prior level of function; (-) previous  discectomy affecting right side, Increase shoulder pain on the left side.    PT Frequency 2x / week   PT Duration 6 weeks   PT Treatment/Interventions Passive range of motion;Neuromuscular re-education;Manual techniques;ADLs/Self Care Home Management;Ultrasound;Electrical Stimulation;Iontophoresis 4mg /ml Dexamethasone;Moist Heat;Therapeutic exercise;Therapeutic activities;Patient/family education   PT Next Visit Plan Advance scapular stabilization and ROM/strengthening exercises, pain control   PT Home Exercise Plan Scapular retractions, AAROM shoulder flexion in supine, sidelying shoulder ER,  serratus punches, serratus punches, weighted shoulder flexion.    Consulted and Agree with Plan of Care Patient      Patient will benefit from skilled therapeutic intervention in order to improve the following deficits and impairments:  Decreased range of motion, Increased fascial restricitons, Impaired UE functional use, Increased muscle spasms, Decreased endurance, Decreased activity tolerance, Pain, Impaired flexibility, Hypomobility,  Decreased strength, Impaired sensation  Visit Diagnosis: Muscle weakness (generalized)  Pain in left shoulder  Pain in right knee     Problem List Patient Active Problem List   Diagnosis Date Noted  . 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    Blythe Stanford, SPT 12/08/2015, 9:37 AM  [facility] [address], [name], [address] Phone: [phone]   Fax:  [phone]  Name: NOLA MOHS MRN: [id number] Date of Birth: Jul 28, 1946

## 2015-12-13 ENCOUNTER — Encounter: Payer: Self-pay | Admitting: Physical Therapy

## 2015-12-13 ENCOUNTER — Ambulatory Visit: Payer: PPO | Attending: Surgery | Admitting: Physical Therapy

## 2015-12-13 DIAGNOSIS — M6281 Muscle weakness (generalized): Secondary | ICD-10-CM | POA: Diagnosis not present

## 2015-12-13 DIAGNOSIS — M25512 Pain in left shoulder: Secondary | ICD-10-CM | POA: Diagnosis not present

## 2015-12-13 DIAGNOSIS — M25561 Pain in right knee: Secondary | ICD-10-CM

## 2015-12-13 NOTE — Therapy (Signed)
[facility] [address], [name], [address] Phone: [phone]   Fax:  [phone]  Physical Therapy Treatment  Patient Details  Name: Laura Hale MRN: [id number] Date of Birth: 1945-08-18 Referring Provider: Milagros Evener, MD  Encounter Date: 12/13/2015      PT End of Session - 12/13/15 0916    Visit Number 5   Number of Visits 12   Date for PT Re-Evaluation 01/04/16   Authorization Type 5   Authorization Time Period 10 (G Code)   PT Start Time [id number]   PT Stop Time [id number]   PT Time Calculation (min) 49 min   Activity Tolerance No increased pain;Patient tolerated treatment well   Behavior During Therapy [facility] for tasks assessed/performed      Past Medical History  Diagnosis Date  . Arthritis     elbow, hands, neck   . Atypical childhood psychosis 2007    myoview -negative for ischemia with preserved LV function  . Colon polyps   . Hypercalcemia 2009    calcium  WNL 10.2 on 04-2015  . Hyperlipidemia   . Obesity   . Urinary incontinence   . Asthma     allergy induced asthma-only exacerbated by smells like perfume, smoke etc  . Complication of anesthesia     WITH ACDF IN JUNE 2016-PT STATES SHE LOST HER VOICE X 10 WEEKS DUE TO INTUBATION    Past Surgical History  Procedure Laterality Date  . Elbow surgery Left 04/2003    x2  & for repair & then remove all hardware, due to injury from a fall  . Hand surgery Right 2006    injury- then eventually lost remainder of thumb  . Abdominal hysterectomy    . Anterior cervical decomp/discectomy fusion N/A 01/31/2015    Procedure: ANTERIOR CERVICAL DECOMPRESSION/DISCECTOMY FUSION CERVICAL FIVE SIX;  Surgeon: Karie Chimera, MD;  Location: [place] [facility];  Service: Neurosurgery;  Laterality: N/A;  . Colonoscopy    . 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 Orif humerus fracture Right 08/31/2015    Procedure: OPEN REDUCTION INTERNAL FIXATION (ORIF) PROXIMAL HUMERUS  FRACTURE w/ #2 fberwire;  Surgeon: Corky Mull, MD;  Location: [facility];  Service: Orthopedics;  Laterality: Right;    There were no vitals filed for this visit.      Subjective Assessment - 12/13/15 0803    Subjective Patient reports increased pain when walking around [place] over the weekend. She states she required to rest in the hotel over the weekend secondary to increased knee pain. Patient reports her shoulder and knee are feeling much better today. Mentions she can walk 5-10 minutes before onset of pain.    Limitations House hold activities;Lifting;Other (comment)   How long can you stand comfortably? 5 min   How long can you walk comfortably? 52min   Patient Stated Goals Would like to be able to drive and use R shoulder without pain.    Currently in Pain? Yes   Pain Score 2   Left shoulder: 1.5/10   Pain Location Knee   Pain Orientation Left   Pain Descriptors / Indicators Aching   Pain Type Acute pain   Pain Onset More than a month ago      Objective: Measurement: 10 degrees from full knee ext on R; knee flexion:  Palpation: Increased guarding and significant spasms along vastus lateralis and medial hamstring tendons.   Treatment: Therapeutic Exercise: Overhead shoulder flexion in  supine with 1# weight -- x 12 (stopped secondary to increased shoulder pain) Pec stretch in open doorway -- 20sec hold x 2 on L Bridges with ball squeezes for hip adduction -- x 10 -- stopped secondary to fatigue  Seated resisted hip abduction -- x 15 green band resistance  Seated ball/glute squeezes -- x15 Resisted hamstring isometrics in sitting between (Performed at 06-11-59-90 with 5 sec hold x 3) SLR in supine -- x15  Manual therapy: Soft tissue mobilization to lateral quadriceps and hamstrings (More on semitendinosus/semimembranosis vs biceps femoris) ultilizing superficial and deep techniques to decrease muscle spasms and guarding with patient positioned in supine. Patellar mobilizer to  right knee 15-20 seconds x 3 with mobilizations superior/inferior, and laterally.   Response to Treatment: Decreased knee pain post performing soft tissue mobilization and therapeutic exercises indicating improved tissue elasticity and improved motor control. Good demonstration of form when performing bridges exercise requiring minimal verbal cueing to activate gluteal musculature.        PT Education - 12/13/15 0916    Education provided Yes   Education Details HEP: seated hip abduction against green band   Person(s) Educated Patient   Methods Explanation;Demonstration   Comprehension Verbalized understanding;Returned demonstration             PT Long Term Goals - 11/23/15 1631    PT LONG TERM GOAL #1   Title Patient will present with an NDI score of <14% impaired by 11/24/15 to show significant improvement with neck function to improve head turning for driving.   Baseline 24% impaired ( 11/21/2015 4% impairment)   Status Achieved   PT LONG TERM GOAL #2   Title Patient will present with an QuickDASH score of <20% impaired by 11/24/15 to show significant improvement with shoulder function to improve ability to chop food while cooking.   Baseline 51% Impaired ( 11/21/2015 18% impaired)   Status Achieved   PT LONG TERM GOAL #3   Title Patient will be independent with HEP focusing on increasing muscular endurance, coordination, and strength by 11/24/15 to progress tissue healing after therapy is complete.   Baseline limited knowledge of pain control and appropriate progression of exercises (11/21/15 Independent with exercise performance and progression)   Status Achieved   PT LONG TERM GOAL #4   Title Patient will present with an QuickDASH score of <20% impaired by 01/04/16 to show significant improvement with Left shoulder function to improve ability to prepare food.   Baseline QuickDASH: 43% (moderate disability)   Status New   PT LONG TERM GOAL #5   Title Patient will present with a  LEFS score of 40/64 by 01/04/16 to demonstrate significant improvement in lower extremity function and improved ability to perform prolonged standing/   Baseline LEFS: 26/64 (moderate-severe disability)   Status New   Additional Long Term Goals   Additional Long Term Goals Yes   PT LONG TERM GOAL #6   Title Patient will be independent with HEP focusing on increasing muscular endurance, coordination, and strength by 01/04/16 to progress tissue healing after therapy is complete.   Baseline Dependent with exercise performance technique and progression.   Status New               Plan - 12/13/15 [id number]    Clinical Impression Statement Focused on decreasing knee symptoms and improving motor control of the knees/hips today to allow for improved muscular recruitment when ambulating. Patient demonstrates decreased knee extension which was improved after tactile cueing to activate quadriceps  muscular and pt will benefit from further skilled therapy aimed at improving muscular endurance/control to return to prior level of function.    Rehab Potential Good   Clinical Impairments Affecting Rehab Potential (+) highly motivated, acute condition, active prior level of function; (-) previous discectomy affecting right side, Increase shoulder pain on the left side.    PT Frequency 2x / week   PT Duration 6 weeks   PT Treatment/Interventions Passive range of motion;Neuromuscular re-education;Manual techniques;ADLs/Self Care Home Management;Ultrasound;Electrical Stimulation;Iontophoresis 4mg /ml Dexamethasone;Moist Heat;Therapeutic exercise;Therapeutic activities;Patient/family education   PT Next Visit Plan Advance scapular stabilization and ROM/strengthening exercises, pain control   PT Home Exercise Plan Scapular retractions, AAROM shoulder flexion in supine, sidelying shoulder ER,  serratus punches, serratus punches, weighted shoulder flexion.    Consulted and Agree with Plan of Care Patient      Patient  will benefit from skilled therapeutic intervention in order to improve the following deficits and impairments:  Decreased range of motion, Increased fascial restricitons, Impaired UE functional use, Increased muscle spasms, Decreased endurance, Decreased activity tolerance, Pain, Impaired flexibility, Hypomobility, Decreased strength, Impaired sensation  Visit Diagnosis: Muscle weakness (generalized)  Pain in left shoulder  Pain in right knee     Problem List Patient Active Problem List   Diagnosis Date Noted  . 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    Blythe Stanford, SPT 12/13/2015, 3:07 PM  [facility] [address] [name], [address] Phone: [phone]   Fax:  [phone]  Name: Laura Hale MRN: [id number] Date of Birth: 24-Sep-1945

## 2015-12-19 ENCOUNTER — Ambulatory Visit: Payer: PPO | Admitting: Physical Therapy

## 2015-12-19 ENCOUNTER — Encounter: Payer: Self-pay | Admitting: Physical Therapy

## 2015-12-19 DIAGNOSIS — M6281 Muscle weakness (generalized): Secondary | ICD-10-CM

## 2015-12-19 DIAGNOSIS — M25561 Pain in right knee: Secondary | ICD-10-CM

## 2015-12-19 DIAGNOSIS — M25512 Pain in left shoulder: Secondary | ICD-10-CM

## 2015-12-19 NOTE — Therapy (Signed)
[facility] [address] [name], [address] Phone: [phone]   Fax:  [phone]  Physical Therapy Treatment  Patient Details  Name: Laura Hale MRN: [id number] Date of Birth: May 27, 1946 Referring Provider: Milagros Evener, MD  Encounter Date: 12/19/2015      PT End of Session - 12/19/15 1938    Visit Number 6   Number of Visits 12   Date for PT Re-Evaluation 01/04/16   Authorization Type 6   Authorization Time Period 10 (G Code)   PT Start Time 1030   PT Stop Time 1114   PT Time Calculation (min) 44 min   Activity Tolerance No increased pain;Patient tolerated treatment well   Behavior During Therapy [facility] for tasks assessed/performed      Past Medical History  Diagnosis Date  . Arthritis     elbow, hands, neck   . Atypical childhood psychosis 2007    myoview -negative for ischemia with preserved LV function  . Colon polyps   . Hypercalcemia 2009    calcium  WNL 10.2 on 04-2015  . Hyperlipidemia   . Obesity   . Urinary incontinence   . Asthma     allergy induced asthma-only exacerbated by smells like perfume, smoke etc  . Complication of anesthesia     WITH ACDF IN JUNE 2016-PT STATES SHE LOST HER VOICE X 10 WEEKS DUE TO INTUBATION    Past Surgical History  Procedure Laterality Date  . Elbow surgery Left 04/2003    x2  & for repair & then remove all hardware, due to injury from a fall  . Hand surgery Right 2006    injury- then eventually lost remainder of thumb  . Abdominal hysterectomy    . Anterior cervical decomp/discectomy fusion N/A 01/31/2015    Procedure: ANTERIOR CERVICAL DECOMPRESSION/DISCECTOMY FUSION CERVICAL FIVE SIX;  Surgeon: Karie Chimera, MD;  Location: [place] [facility];  Service: Neurosurgery;  Laterality: N/A;  . Colonoscopy    . 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 Orif humerus fracture Right 08/31/2015    Procedure: OPEN REDUCTION INTERNAL FIXATION (ORIF) PROXIMAL HUMERUS  FRACTURE w/ #2 fberwire;  Surgeon: Corky Mull, MD;  Location: [facility];  Service: Orthopedics;  Laterality: Right;    There were no vitals filed for this visit.      Subjective Assessment - 12/19/15 1035    Subjective Patient reports difficulty when transferring from sitting to standing from the toilet and mentions increased pain and aggravation of symptoms from increased walking over the past week. States she's been experiencing  increased swelling and stiffness over the knee. States Ice and NSAIDS minimally helped.    Pertinent History Patient reports history of falling on ice 08/19/15, injuring right shoulder and underwent ORIF  08/31/15. 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     How long can you stand comfortably? 5 min   How long can you walk comfortably? 58min   Diagnostic tests X-RAY: Greater tubercle fx -- ORIF performed on 08/31/15.   Patient Stated Goals Would like to be able to drive and use R shoulder without pain.    Currently in Pain? Yes   Pain Score 4   2/10 in the shoulder when moving  Pain Location Knee   Pain Orientation Right   Pain Descriptors / Indicators Aching   Pain Type Acute pain      Objective: Measurement: 10 degrees from full knee ext on R;  Palpation: Increased guarding and significant spasms along vastus lateralis, rectus femoris and medial hamstring tendons -- medially hamstrings especially tender today  Treatment: Therapeutic Exercise: Overhead shoulder flexion in supine with 2# weight --2 x 15  Serratus Punches in supine -- 2 x 15 Knees to chest in supine with yellow ball -- 2 min (To decrease swelling and increase mobility)  Manual therapy: Soft tissue mobilization to  lateral/medial quadriceps, rectus femoris and hamstrings (More on semitendinosus/semimembranosis vs biceps femoris) ultilizing superficial and techniques to decrease muscle spasms and guarding with patient positioned in supine. Patellar mobilizer to right knee 15-20 seconds x 3 with mobilizations superior/inferior, and laterally. Performed soft tissue mobilization to pectoralis major and latissimus dorsi when patient positioned in supine to decrease pain and guarding in the shoulder.   Modalities: High volt e-stim through clinical protocol and 4 large electrodes placed along the the distal hamstrings and quadriceps tendons to decrease spasms, inflammation, and pain with patient place in supine with legs elevated over a bolster/pillows. Ice pack placed over the knee to further decrease inflammation. Skin checked with no adverse signs noted during or after treatment.   Response to Treatment: Decreased knee pain post performing soft tissue mobilization and modalities indicating decrease inflammation and guarding. Good demonstration of knee to chest exercise requiring minimal cueing to go through decreased AROM to not aggravate knee pain.        PT Education - 12/19/15 1937    Education provided Yes   Education Details HEP: supine knee flexion under ball    Person(s) Educated Patient   Methods Explanation   Comprehension Verbalized understanding;Returned demonstration             PT Long Term Goals - 11/23/15 1631    PT LONG TERM GOAL #1   Title Patient will present with an NDI score of <14% impaired by 11/24/15 to show significant improvement with neck function to improve head turning for driving.   Baseline 24% impaired ( 11/21/2015 4% impairment)   Status Achieved   PT LONG TERM GOAL #2   Title Patient will present with an QuickDASH score of <20% impaired by 11/24/15 to show significant improvement with shoulder function to improve ability to chop food while cooking.   Baseline 51%  Impaired ( 11/21/2015 18% impaired)   Status Achieved   PT LONG TERM GOAL #3   Title Patient will be independent with HEP focusing on increasing muscular endurance, coordination, and strength by 11/24/15 to progress tissue healing after therapy is complete.   Baseline limited knowledge of pain control and appropriate progression of exercises (11/21/15 Independent with exercise performance and progression)   Status Achieved   PT LONG TERM GOAL #4   Title Patient will present with an QuickDASH score of <20% impaired by 01/04/16 to show significant improvement with Left shoulder function to improve ability to prepare food.   Baseline QuickDASH: 43% (moderate disability)   Status New   PT LONG TERM GOAL #5   Title Patient will present with a LEFS score of 40/64 by 01/04/16 to demonstrate significant improvement in lower extremity function and improved ability to perform prolonged standing/   Baseline LEFS: 26/64 (moderate-severe disability)   Status New   Additional Long Term Goals   Additional Long Term Goals Yes  PT LONG TERM GOAL #6   Title Patient will be independent with HEP focusing on increasing muscular endurance, coordination, and strength by 01/04/16 to progress tissue healing after therapy is complete.   Baseline Dependent with exercise performance technique and progression.   Status New               Plan - 12/19/15 1954    Clinical Impression Statement Focused today's session on decreasing knee pain and swelling as patient demonstrates significant guarding and pain with knee movement and weight bearing activities such as walking. Decrease in pain after performing manual therapy and modalities indicating improved inflammation and guarding and patient will benefit from further skilled therapy to decrease knee pain to allow for the performance of LE strengthening exercises.    Rehab Potential Good   Clinical Impairments Affecting Rehab Potential (+) highly motivated, acute  condition, active prior level of function; (-) previous discectomy affecting right side, Increase shoulder pain on the left side.    PT Frequency 2x / week   PT Duration 6 weeks   PT Treatment/Interventions Passive range of motion;Neuromuscular re-education;Manual techniques;ADLs/Self Care Home Management;Ultrasound;Electrical Stimulation;Iontophoresis 4mg /ml Dexamethasone;Moist Heat;Therapeutic exercise;Therapeutic activities;Patient/family education   PT Next Visit Plan Advance scapular stabilization and ROM/strengthening exercises, pain control   PT Home Exercise Plan Scapular retractions, AAROM shoulder flexion in supine, sidelying shoulder ER,  serratus punches, serratus punches, weighted shoulder flexion.    Consulted and Agree with Plan of Care Patient      Patient will benefit from skilled therapeutic intervention in order to improve the following deficits and impairments:  Decreased range of motion, Increased fascial restricitons, Impaired UE functional use, Increased muscle spasms, Decreased endurance, Decreased activity tolerance, Pain, Impaired flexibility, Hypomobility, Decreased strength, Impaired sensation  Visit Diagnosis: Muscle weakness (generalized)  Pain in left shoulder  Pain in right knee     Problem List Patient Active Problem List   Diagnosis Date Noted  . 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    Blythe Stanford, SPT 12/19/2015, 7:56 PM  [place] [address] [name], [address] Phone: [phone]   Fax:  [phone]  Name: Laura Hale MRN: [id number] Date of Birth: 1946/02/10

## 2015-12-21 ENCOUNTER — Encounter: Payer: Self-pay | Admitting: Physical Therapy

## 2015-12-21 ENCOUNTER — Ambulatory Visit: Payer: PPO | Admitting: Physical Therapy

## 2015-12-21 DIAGNOSIS — M25512 Pain in left shoulder: Secondary | ICD-10-CM

## 2015-12-21 DIAGNOSIS — M6281 Muscle weakness (generalized): Secondary | ICD-10-CM

## 2015-12-21 DIAGNOSIS — M25561 Pain in right knee: Secondary | ICD-10-CM

## 2015-12-21 NOTE — Therapy (Signed)
[place] [facility] [address], [name], [address] Phone: [phone]   Fax:  [phone]  Physical Therapy Treatment  Patient Details  Name: Laura Hale MRN: [id number] Date of Birth: 1946-03-29 Referring Provider: Milagros Evener, MD  Encounter Date: 12/21/2015      PT End of Session - 12/21/15 1100    Visit Number 7   Number of Visits 12   Date for PT Re-Evaluation 01/04/16   Authorization Type 7   Authorization Time Period 10 (G Code)   PT Start Time 0845   PT Stop Time 0928   PT Time Calculation (min) 43 min   Activity Tolerance No increased pain;Patient tolerated treatment well   Behavior During Therapy [name] for tasks assessed/performed      Past Medical History  Diagnosis Date  . Arthritis     elbow, hands, neck   . Atypical childhood psychosis 2007    myoview -negative for ischemia with preserved LV function  . Colon polyps   . Hypercalcemia 2009    calcium  WNL 10.2 on 04-2015  . Hyperlipidemia   . Obesity   . Urinary incontinence   . Asthma     allergy induced asthma-only exacerbated by smells like perfume, smoke etc  . Complication of anesthesia     WITH ACDF IN JUNE 2016-PT STATES SHE LOST HER VOICE X 10 WEEKS DUE TO INTUBATION    Past Surgical History  Procedure Laterality Date  . Elbow surgery Left 04/2003    x2  & for repair & then remove all hardware, due to injury from a fall  . Hand surgery Right 2006    injury- then eventually lost remainder of thumb  . Abdominal hysterectomy    . Anterior cervical decomp/discectomy fusion N/A 01/31/2015    Procedure: ANTERIOR CERVICAL DECOMPRESSION/DISCECTOMY FUSION CERVICAL FIVE SIX;  Surgeon: Karie Chimera, MD;  Location: [place] [facility];  Service: Neurosurgery;  Laterality: N/A;  . Colonoscopy    . 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 Orif humerus fracture Right 08/31/2015    Procedure: OPEN REDUCTION INTERNAL FIXATION (ORIF) PROXIMAL HUMERUS  FRACTURE w/ #2 fberwire;  Surgeon: Corky Mull, MD;  Location: [facility];  Service: Orthopedics;  Laterality: Right;    There were no vitals filed for this visit.      Subjective Assessment - 12/21/15 0848    Subjective Patient reports the knee continues to be aggravated but has improved since the previous visit and electrical stimulation seemed to help. States she was able to stand and cook for 2 hours last night without considerable pain. Reports shoulder pain is aggravated when raising the arm overhead mentioning a stretching sensation over the pec major and latissimus dorsi.    Pertinent History Patient reports history of falling on ice 08/19/15, injuring right shoulder and underwent ORIF  08/31/15. 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     How long can you stand comfortably? 5 min   How long can you walk comfortably? 23min   Diagnostic tests X-RAY: Greater tubercle fx -- ORIF performed on 08/31/15.   Patient Stated Goals Would like to be able to drive and use R shoulder without pain.    Currently in Pain? Yes  Pain Score 4    Pain Location Shoulder   Pain Orientation Right   Pain Descriptors / Indicators Aching       Objective: Measurement: 10 degrees from full knee ext on R; 110 degrees of knee flexion on R Palpation: Increased guarding and significant spasms along vastus lateralis, rectus femoris and medial hamstring tendons. Increased tenderness/spasms along pectoralis major, latissimus and subscapularis on L   Treatment: Therapeutic Exercise: Overhead shoulder flexion in supine with 2# weight -- x 15  Serratus Punches in supine --  x 15 Dynamic shoulder stabilization in supine with 2# weight -- 30sec  x3 Bridges in supine -- x15 Knees to chest in supine with yellow ball -- 2 min (To decrease swelling and increase mobility) Resisted hamsiring in sitting ( 0-30-60) -- x 5 sec hold SLR in supine -- x12 bilaterally   Manual therapy:  Performed soft tissue mobilization to pectoralis major, subscapularis and latissimus dorsi utilizing superficial and deep when patient positioned in supine to decrease pain and guarding in the shoulder.   Modalities: High volt e-stim through clinical protocol and 2 large electrodes placed along the the distal hamstrings to decrease spasms, inflammation, and pain with patient placed in supine with legs elevated over a bolster/pillows. [place] stimulation 2 large electrodes placed over quadriceps 10 secs on/ 10 secs off stimulation to improve muscular activation for 20 mins.  Moist heat pack placed on right LE hamstring and quadriceps right knee to further decrease muscle spasms in conjunction with estim. Skin checked with no adverse signs noted during or after treatment.   Response to Treatment: Decreased pain after performing modalities today indicating decreased muscle guarding and improved tissue tightness. Good demonstration of form when performing bridging exercise in supine requiring verbal cueing to activate the gluteus musculature. Patient able to walk with decreased pain to mild right knee following treatment         PT Education - 12/21/15 1059    Education provided Yes   Education Details Educated to continue HEP to decrease pain at home and maintain improved symptoms   Person(s) Educated Patient   Methods Explanation   Comprehension Verbalized understanding             PT Long Term Goals - 11/23/15 1631    PT LONG TERM GOAL #1   Title Patient will present with an NDI score of <14% impaired by 11/24/15 to show significant improvement with neck function to improve head turning for driving.   Baseline 24% impaired ( 11/21/2015 4% impairment)    Status Achieved   PT LONG TERM GOAL #2   Title Patient will present with an QuickDASH score of <20% impaired by 11/24/15 to show significant improvement with shoulder function to improve ability to chop food while cooking.   Baseline 51% Impaired ( 11/21/2015 18% impaired)   Status Achieved   PT LONG TERM GOAL #3   Title Patient will be independent with HEP focusing on increasing muscular endurance, coordination, and strength by 11/24/15 to progress tissue healing after therapy is complete.   Baseline limited knowledge of pain control and appropriate progression of exercises (11/21/15 Independent with exercise performance and progression)   Status Achieved   PT LONG TERM GOAL #4   Title Patient will present with an QuickDASH score of <20% impaired by 01/04/16 to show significant improvement with Left shoulder function to improve ability to prepare food.   Baseline QuickDASH: 43% (moderate disability)   Status New   PT LONG TERM GOAL #5  Title Patient will present with a LEFS score of 40/64 by 01/04/16 to demonstrate significant improvement in lower extremity function and improved ability to perform prolonged standing/   Baseline LEFS: 26/64 (moderate-severe disability)   Status New   Additional Long Term Goals   Additional Long Term Goals Yes   PT LONG TERM GOAL #6   Title Patient will be independent with HEP focusing on increasing muscular endurance, coordination, and strength by 01/04/16 to progress tissue healing after therapy is complete.   Baseline Dependent with exercise performance technique and progression.   Status New               Plan - 12/21/15 1103    Clinical Impression Statement Decreased pain and symptoms after performing modalities and therapeutic exercise indicating improved muscle spasms and guarding right LE/knee. Patient able to perform more exercises compared to the previous visit indicating functional carryover between visits and pt will benefit from further  skilled therapy to return to prior level of function.    Rehab Potential Good   Clinical Impairments Affecting Rehab Potential (+) highly motivated, acute condition, active prior level of function; (-) previous discectomy affecting right side, Increase shoulder pain on the left side.    PT Frequency 2x / week   PT Duration 6 weeks   PT Treatment/Interventions Passive range of motion;Neuromuscular re-education;Manual techniques;ADLs/Self Care Home Management;Ultrasound;Electrical Stimulation;Iontophoresis 4mg /ml Dexamethasone;Moist Heat;Therapeutic exercise;Therapeutic activities;Patient/family education   PT Next Visit Plan Continue to decrease knee pain and advance knee and shoulder exercises as appropriate.    PT Home Exercise Plan Scapular retractions, AAROM shoulder flexion in supine, sidelying shoulder ER,  serratus punches, serratus punches, weighted shoulder flexion.    Consulted and Agree with Plan of Care Patient      Patient will benefit from skilled therapeutic intervention in order to improve the following deficits and impairments:  Decreased range of motion, Increased fascial restricitons, Impaired UE functional use, Increased muscle spasms, Decreased endurance, Decreased activity tolerance, Pain, Impaired flexibility, Hypomobility, Decreased strength, Impaired sensation  Visit Diagnosis: Muscle weakness (generalized)  Pain in left shoulder  Pain in right knee     Problem List Patient Active Problem List   Diagnosis Date Noted  . 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    Blythe Stanford, SPT 12/21/2015, 11:25 AM  [place] [facility] [address] [name], [address] Phone: [phone]   Fax:  [phone]  Name: Laura Hale MRN: [id number] Date of Birth: 1946/02/01

## 2015-12-25 ENCOUNTER — Encounter: Payer: Self-pay | Admitting: Physical Therapy

## 2015-12-25 ENCOUNTER — Ambulatory Visit: Payer: PPO | Admitting: Physical Therapy

## 2015-12-25 DIAGNOSIS — M25512 Pain in left shoulder: Secondary | ICD-10-CM

## 2015-12-25 DIAGNOSIS — M25561 Pain in right knee: Secondary | ICD-10-CM

## 2015-12-25 DIAGNOSIS — M6281 Muscle weakness (generalized): Secondary | ICD-10-CM

## 2015-12-25 NOTE — Therapy (Signed)
[facility] [address], [name], [address] Phone: [phone]   Fax:  [phone]  Physical Therapy Treatment  Patient Details  Name: Laura Hale MRN: [id number] Date of Birth: 1945-08-24 Referring Provider: Milagros Evener, MD  Encounter Date: 12/25/2015      PT End of Session - 12/25/15 1507    Visit Number 8   Number of Visits 12   Date for PT Re-Evaluation 01/04/16   Authorization Type 8   Authorization Time Period 10 (G Code)   PT Start Time 1001   PT Stop Time 1028   PT Time Calculation (min) 27 min   Activity Tolerance No increased pain;Patient tolerated treatment well   Behavior During Therapy [facility] for tasks assessed/performed      Past Medical History  Diagnosis Date  . Arthritis     elbow, hands, neck   . Atypical childhood psychosis 2007    myoview -negative for ischemia with preserved LV function  . Colon polyps   . Hypercalcemia 2009    calcium  WNL 10.2 on 04-2015  . Hyperlipidemia   . Obesity   . Urinary incontinence   . Asthma     allergy induced asthma-only exacerbated by smells like perfume, smoke etc  . Complication of anesthesia     WITH ACDF IN JUNE 2016-PT STATES SHE LOST HER VOICE X 10 WEEKS DUE TO INTUBATION    Past Surgical History  Procedure Laterality Date  . Elbow surgery Left 04/2003    x2  & for repair & then remove all hardware, due to injury from a fall  . Hand surgery Right 2006    injury- then eventually lost remainder of thumb  . Abdominal hysterectomy    . Anterior cervical decomp/discectomy fusion N/A 01/31/2015    Procedure: ANTERIOR CERVICAL DECOMPRESSION/DISCECTOMY FUSION CERVICAL FIVE SIX;  Surgeon: Karie Chimera, MD;  Location: [place];  Service: Neurosurgery;  Laterality: N/A;  . Colonoscopy    . 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 Orif humerus fracture Right 08/31/2015    Procedure: OPEN REDUCTION INTERNAL FIXATION (ORIF) PROXIMAL HUMERUS  FRACTURE w/ #2 fberwire;  Surgeon: Corky Mull, MD;  Location: [facility];  Service: Orthopedics;  Laterality: Right;    There were no vitals filed for this visit.      Subjective Assessment - 12/25/15 1001    Subjective Patient reports knees are much improved stating she's able to walk with decreased difficulty. States no current pain in the shoulder or knee.    Pertinent History Patient reports history of falling on ice 08/19/15, injuring right shoulder and underwent ORIF  08/31/15. 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     How long can you stand comfortably? 5 min   How long can you walk comfortably? 28min   Diagnostic tests X-RAY: Greater tubercle fx -- ORIF performed on 08/31/15.   Patient Stated Goals Would like to be able to drive and use R shoulder without pain.    Currently in Pain? No/denies   Pain Score 0-No pain   Pain Location Shoulder   Pain Orientation Right   Pain Descriptors / Indicators Aching   Pain Type Acute pain   Pain Onset More than a  month ago   Pain Frequency Intermittent      Objective: Measurement: 5 degrees from full knee ext on R;  110 degrees of knee flexion on R Palpation: Increased guarding and significant spasms along vastus lateralis, rectus femoris and medial hamstring tendons--improved today from previous session.  Treatment: Therapeutic Exercise: Sit to stand with hip adduction -- x 15 (Ball in between knees) Side stepping on airex beam -- x6 (up/down) Tandem stance on airex -- 3 x 20 sec Leg press -- x15 (bilaterally @20 #); x15 (unilaterally @#10) Single leg marches in standing -- x15 bilaterally Resisted hamstring curl with band -- x 15 red band (bilaterally)  Response to  Treatment: No increase in symptoms during or after the treatment session. Good demonstration of form when performing  tandem stance on the airex pad requiring minimal UE to perform.         PT Education - 12/25/15 1506    Education provided Yes   Education Details HEP: Marches in standing, tandem stance at counter.   Person(s) Educated Patient   Methods Explanation;Demonstration   Comprehension Verbalized understanding;Returned demonstration             PT Long Term Goals - 11/23/15 1631    PT LONG TERM GOAL #1   Title Patient will present with an NDI score of <14% impaired by 11/24/15 to show significant improvement with neck function to improve head turning for driving.   Baseline 24% impaired ( 11/21/2015 4% impairment)   Status Achieved   PT LONG TERM GOAL #2   Title Patient will present with an QuickDASH score of <20% impaired by 11/24/15 to show significant improvement with shoulder function to improve ability to chop food while cooking.   Baseline 51% Impaired ( 11/21/2015 18% impaired)   Status Achieved   PT LONG TERM GOAL #3   Title Patient will be independent with HEP focusing on increasing muscular endurance, coordination, and strength by 11/24/15 to progress tissue healing after therapy is complete.   Baseline limited knowledge of pain control and appropriate progression of exercises (11/21/15 Independent with exercise performance and progression)   Status Achieved   PT LONG TERM GOAL #4   Title Patient will present with an QuickDASH score of <20% impaired by 01/04/16 to show significant improvement with Left shoulder function to improve ability to prepare food.   Baseline QuickDASH: 43% (moderate disability)   Status New   PT LONG TERM GOAL #5   Title Patient will present with a LEFS score of 40/64 by 01/04/16 to demonstrate significant improvement in lower extremity function and improved ability to perform prolonged standing/   Baseline LEFS: 26/64 (moderate-severe  disability)   Status New   Additional Long Term Goals   Additional Long Term Goals Yes   PT LONG TERM GOAL #6   Title Patient will be independent with HEP focusing on increasing muscular endurance, coordination, and strength by 01/04/16 to progress tissue healing after therapy is complete.   Baseline Dependent with exercise performance technique and progression.   Status New               Plan - 12/25/15 1507    Clinical Impression Statement Shortened session today as patient arrived 15 min late for her appointment. Patient's resting symptoms were greatly improved today therefore was able to focus therapy session on performing functional balance and hip stabilization exercises to improve current limitations. Patient demonstrates increased ankle strategies and early onset of fatigue indicating decreased motor control and muscular  endurance. Patient will benefit from further skilled therapy to return to prior level of function.    Rehab Potential Good   Clinical Impairments Affecting Rehab Potential (+) highly motivated, acute condition, active prior level of function; (-) previous discectomy affecting right side, Increase shoulder pain on the left side.    PT Frequency 2x / week   PT Duration 6 weeks   PT Treatment/Interventions Passive range of motion;Neuromuscular re-education;Manual techniques;ADLs/Self Care Home Management;Ultrasound;Electrical Stimulation;Iontophoresis 4mg /ml Dexamethasone;Moist Heat;Therapeutic exercise;Therapeutic activities;Patient/family education   PT Next Visit Plan Continue to decrease knee pain and advance knee and shoulder exercises as appropriate.    PT Home Exercise Plan Scapular retractions, AAROM shoulder flexion in supine, sidelying shoulder ER,  serratus punches, serratus punches, weighted shoulder flexion.    Consulted and Agree with Plan of Care Patient      Patient will benefit from skilled therapeutic intervention in order to improve the following  deficits and impairments:  Decreased range of motion, Increased fascial restricitons, Impaired UE functional use, Increased muscle spasms, Decreased endurance, Decreased activity tolerance, Pain, Impaired flexibility, Hypomobility, Decreased strength, Impaired sensation  Visit Diagnosis: Muscle weakness (generalized)  Pain in left shoulder  Pain in right knee     Problem List Patient Active Problem List   Diagnosis Date Noted  . 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    Blythe Stanford, SPT 12/25/2015, 3:11 PM  [facility] [address] [name], [address] Phone: [phone]   Fax:  [phone]  Name: Laura Hale MRN: [id number] Date of Birth: Jun 21, 1946

## 2015-12-28 ENCOUNTER — Ambulatory Visit: Payer: PPO | Admitting: Physical Therapy

## 2015-12-28 ENCOUNTER — Encounter: Payer: Self-pay | Admitting: Physical Therapy

## 2015-12-28 DIAGNOSIS — M25561 Pain in right knee: Secondary | ICD-10-CM

## 2015-12-28 DIAGNOSIS — M25512 Pain in left shoulder: Secondary | ICD-10-CM

## 2015-12-28 DIAGNOSIS — M6281 Muscle weakness (generalized): Secondary | ICD-10-CM | POA: Diagnosis not present

## 2015-12-28 NOTE — Therapy (Signed)
[facility] [address] [name], [address] Phone: [phone]   Fax:  [phone]  Physical Therapy Treatment  Patient Details  Name: Laura Hale MRN: [id number] Date of Birth: May 15, 1946 Referring Provider: Milagros Evener, MD  Encounter Date: 12/28/2015      PT End of Session - 12/28/15 1036    Visit Number 9   Number of Visits 12   Date for PT Re-Evaluation 01/04/16   Authorization Type 9   Authorization Time Period 10 (G Code)   PT Start Time 0945   PT Stop Time 1031   PT Time Calculation (min) 46 min   Activity Tolerance Patient tolerated treatment well;No increased pain   Behavior During Therapy [facility] for tasks assessed/performed      Past Medical History  Diagnosis Date  . Arthritis     elbow, hands, neck   . Atypical childhood psychosis 2007    myoview -negative for ischemia with preserved LV function  . Colon polyps   . Hypercalcemia 2009    calcium  WNL 10.2 on 04-2015  . Hyperlipidemia   . Obesity   . Urinary incontinence   . Asthma     allergy induced asthma-only exacerbated by smells like perfume, smoke etc  . Complication of anesthesia     WITH ACDF IN JUNE 2016-PT STATES SHE LOST HER VOICE X 10 WEEKS DUE TO INTUBATION    Past Surgical History  Procedure Laterality Date  . Elbow surgery Left 04/2003    x2  & for repair & then remove all hardware, due to injury from a fall  . Hand surgery Right 2006    injury- then eventually lost remainder of thumb  . Abdominal hysterectomy    . Anterior cervical decomp/discectomy fusion N/A 01/31/2015    Procedure: ANTERIOR CERVICAL DECOMPRESSION/DISCECTOMY FUSION CERVICAL FIVE SIX;  Surgeon: Karie Chimera, MD;  Location: [facility];  Service: Neurosurgery;  Laterality: N/A;  . Colonoscopy    . 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 Orif humerus fracture Right 08/31/2015    Procedure: OPEN REDUCTION INTERNAL FIXATION (ORIF) PROXIMAL HUMERUS  FRACTURE w/ #2 fberwire;  Surgeon: Corky Mull, MD;  Location: [facility];  Service: Orthopedics;  Laterality: Right;    There were no vitals filed for this visit.      Subjective Assessment - 12/28/15 0949    Subjective Patient reports both knees and left shoulder felt better after getting a massage. States she drove for 90 minutes and experienced increased L shoulder fatigue/pain. States the right knee is feeling better today and wants to focus on the shoulder.    Pertinent History Patient reports history of falling on ice 08/19/15, injuring right shoulder and underwent ORIF  08/31/15. 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     How long can you stand comfortably? 5 min   How long can you walk comfortably? 76min   Diagnostic tests X-RAY: Greater tubercle fx -- ORIF performed on 08/31/15.   Patient Stated Goals Would like to be able to drive and use R shoulder without pain.    Currently in Pain? Yes   Pain Score 2   Right knee pain 3/10   Pain Location Shoulder   Pain  Orientation Left   Pain Descriptors / Indicators Aching   Pain Type Acute pain   Pain Onset More than a month ago   Pain Frequency Intermittent       Objective: Measurement: 150 AROM overhead shoulder flexion in sitting (Improved to 165 degrees after manual therapy) Palpation: Increased guarding and minor spasms along vastus lateralis, rectus femoris and vastus medialis. Minor swelling noted throughout R LE and increased guarding in the latissimus dorsi, subscapularis and pectoralis major  Treatment: Therapeutic Exercise: Patient performed exercises with guidance, verbal and tactile cues and demonstration of therapist:  Serratus punches in supine -- 2#  in right, #3 in left x20 AAROM in supine shoulder flexion -- x20 Sidelying shoulder ER -- 2 x 15 (One set with 2# weight, one set unweighted) UE ranger shoulder flexion -- x15 with 2# UE ranger shoulder external/internal AROM -- x15 with 2# weight, x15 (with no weight) UE ranger shoulder scapular circles -- x15 clockwise/counterclockwise (straight arm) Scapular retraction at [place] -- x15 5#, X10 10# Straight arm pull down at [place] in standing -- x15 # 10  Manual therapy: STM to pectoralis major, latissimus dorsi and subscapularis utilizing deep and superficial techniques to decrease spasms and pain with patient positioned in supine.   Response to Treatment: No increase in symptoms during or after the treatment session with increased fatigue reported at end of session.  Improved overhead shoulder flexion after performing manual therapy indicating decreased muscle spasms and guarding. Good demonstration of scapular circles with the UE ranger requiring minimal tactile cueing to perform with proper technique.         PT Education - 12/28/15 1035    Education provided Yes   Education Details HEP: Straight arm pull downs and scapular retraction. Educated on proper shoulder positioning during exercises.    Person(s) Educated Patient   Methods Demonstration;Explanation   Comprehension Verbalized understanding;Returned demonstration             PT Long Term Goals - 11/23/15 1631    PT LONG TERM GOAL #1   Title Patient will present with an NDI score of <14% impaired by 11/24/15 to show significant improvement with neck function to improve head turning for driving.   Baseline 24% impaired ( 11/21/2015 4% impairment)   Status Achieved   PT LONG TERM GOAL #2   Title Patient will present with an QuickDASH score of <20% impaired by 11/24/15 to show significant improvement with shoulder function to improve ability to chop food while cooking.   Baseline 51% Impaired ( 11/21/2015 18% impaired)    Status Achieved   PT LONG TERM GOAL #3   Title Patient will be independent with HEP focusing on increasing muscular endurance, coordination, and strength by 11/24/15 to progress tissue healing after therapy is complete.   Baseline limited knowledge of pain control and appropriate progression of exercises (11/21/15 Independent with exercise performance and progression)   Status Achieved   PT LONG TERM GOAL #4   Title Patient will present with an QuickDASH score of <20% impaired by 01/04/16 to show significant improvement with Left shoulder function to improve ability to prepare food.   Baseline QuickDASH: 43% (moderate disability)   Status New   PT LONG TERM GOAL #5   Title Patient will present with a LEFS score of 40/64 by 01/04/16 to demonstrate significant improvement in lower extremity function and improved ability to perform prolonged standing/   Baseline LEFS: 26/64 (moderate-severe disability)   Status New   Additional Long Term  Goals   Additional Long Term Goals Yes   PT LONG TERM GOAL #6   Title Patient will be independent with HEP focusing on increasing muscular endurance, coordination, and strength by 01/04/16 to progress tissue healing after therapy is complete.   Baseline Dependent with exercise performance technique and progression.   Status New               Plan - 12/28/15 1905    Clinical Impression Statement Patient presents with decreased knee pain today indicating functional carryover between visits. Focussed on L shouluder pain and symptoms as patient is functionally limited with increased driving and reaching overhead. Patient demonstrated increased scapular protraction with exercise and required frequent cueing to perform scapular retraction before performing overhead motions indicating decreased scapular proprioception and knowledge on appropriate shoulder positioning. Although patient is improving, she will benefit from further skilled therapy aimed at improving  limitations to return to prior level of function.   Rehab Potential Good   Clinical Impairments Affecting Rehab Potential (+) highly motivated, acute condition, active prior level of function; (-) previous discectomy affecting right side, Increase shoulder pain on the left side.    PT Frequency 2x / week   PT Duration 6 weeks   PT Treatment/Interventions Passive range of motion;Neuromuscular re-education;Manual techniques;ADLs/Self Care Home Management;Ultrasound;Electrical Stimulation;Iontophoresis 4mg /ml Dexamethasone;Moist Heat;Therapeutic exercise;Therapeutic activities;Patient/family education   PT Next Visit Plan Advance knee and shoulder exercises as appropriate. Perform increased scapular retraction.    PT Home Exercise Plan Scapular retractions, AAROM shoulder flexion in supine, sidelying shoulder ER,  serratus punches, serratus punches, weighted shoulder flexion.    Consulted and Agree with Plan of Care Patient      Patient will benefit from skilled therapeutic intervention in order to improve the following deficits and impairments:  Decreased range of motion, Increased fascial restricitons, Impaired UE functional use, Increased muscle spasms, Decreased endurance, Decreased activity tolerance, Pain, Impaired flexibility, Hypomobility, Decreased strength, Impaired sensation  Visit Diagnosis: Muscle weakness (generalized)  Pain in left shoulder  Pain in right knee     Problem List Patient Active Problem List   Diagnosis Date Noted  . 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    Blythe Stanford, SPT 12/28/2015, 7:10 PM  [facility] [address] [name], [address] Phone: [phone]   Fax:  [phone]  Name: Laura Hale MRN: [id number] Date of Birth: 10/09/1945

## 2016-01-01 ENCOUNTER — Ambulatory Visit: Payer: PPO | Admitting: Physical Therapy

## 2016-01-01 ENCOUNTER — Encounter: Payer: Self-pay | Admitting: Physical Therapy

## 2016-01-01 DIAGNOSIS — M6281 Muscle weakness (generalized): Secondary | ICD-10-CM

## 2016-01-01 DIAGNOSIS — M25561 Pain in right knee: Secondary | ICD-10-CM

## 2016-01-01 NOTE — Therapy (Signed)
[facility] [address], [name], [address] Phone: [phone]   Fax:  [phone]  Physical Therapy Treatment  Patient Details  Name: Laura Hale MRN: [id number] Date of Birth: 01/06/1946 Referring Provider: Milagros Evener, MD  Encounter Date: 01/01/2016      PT End of Session - 01/01/16 0955    Visit Number 10   Number of Visits 12   Date for PT Re-Evaluation 01/04/16   Authorization Type 10   Authorization Time Period 10 (G Code)   PT Start Time [phone]   PT Stop Time 1029   PT Time Calculation (min) 41 min      Past Medical History  Diagnosis Date  . Arthritis     elbow, hands, neck   . Atypical childhood psychosis 2007    myoview -negative for ischemia with preserved LV function  . Colon polyps   . Hypercalcemia 2009    calcium  WNL 10.2 on 04-2015  . Hyperlipidemia   . Obesity   . Urinary incontinence   . Asthma     allergy induced asthma-only exacerbated by smells like perfume, smoke etc  . Complication of anesthesia     WITH ACDF IN JUNE 2016-PT STATES SHE LOST HER VOICE X 10 WEEKS DUE TO INTUBATION    Past Surgical History  Procedure Laterality Date  . Elbow surgery Left 04/2003    x2  & for repair & then remove all hardware, due to injury from a fall  . Hand surgery Right 2006    injury- then eventually lost remainder of thumb  . Abdominal hysterectomy    . Anterior cervical decomp/discectomy fusion N/A 01/31/2015    Procedure: ANTERIOR CERVICAL DECOMPRESSION/DISCECTOMY FUSION CERVICAL FIVE SIX;  Surgeon: Karie Chimera, MD;  Location: [facility];  Service: Neurosurgery;  Laterality: N/A;  . Colonoscopy    . 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 Orif humerus fracture Right 08/31/2015    Procedure: OPEN REDUCTION INTERNAL FIXATION (ORIF) PROXIMAL HUMERUS FRACTURE w/ #2 fberwire;  Surgeon: Corky Mull, MD;  Location: [facility];  Service: Orthopedics;  Laterality: Right;    There were  no vitals filed for this visit.      Subjective Assessment - 01/01/16 0953    Subjective Patient reports left shoulder is much improved with therapy treatment and massage(by a licensed massage therapist). She is also noticing significant improvement in right knee with exercises and therapy. Patient reports she is worse today with left knee having increased pain over the weekend and giving way to the point she is now usig a cane for safety for fear of falling. She has increased swelling lateral left knee and increased pain with weight bearing. She reports she did do some extra standing but cannot say a definite reason for the pain.    Limitations House hold activities;Lifting;Other (comment)   Patient Stated Goals Would like to be able to drive and use R shoulder without pain. walk normally without knee pain and be able to exercise without right knee pain.    Currently in Pain? Yes   Pain Score 5    Pain Orientation Right;Other (Comment)  left knee    Pain Descriptors / Indicators Aching   Pain Type Acute pain   Pain Onset More than a month ago   Pain Frequency Constant      Objective: Observation: Gait: ambulating into clinic using straight cane, decreased weight bearing left LE, +  increased swelling lateral joint line left knee, mild swelling lateral aspect right knee Measurement: 150 AROM overhead left shoulder flexion in sitting Palpation: + point tenderness along LCL left knee and lateral aspect right knee   Treatment: Therapeutic Exercise: Patient performed exercises with guidance, verbal and tactile cues and demonstration of therapist: Supine lying: ball between knees with hip adduction and glute sets 2 x 10 reps Sitting hip adduction with glute sets x 10 reps, hip abduction with green resistive band x 15 reps Sitting quad setting: multiple repetitions with demonstration, verbal cues and varying knee angle to be able to engage muscles (increased pain with full knee extension)     Manual therapy: With patient supine lying with pillow under knees: ice massage performed to knees, lateral aspect x 2-3 min. Without adverse reaction, instructed patient in ice massage for home with precautions; patellar mobilization 3 sets for distraction and medial, inferior glide; left shoulder/RTC musculature STM performed with patient seated followed by AAROM into forward elevation to full ROM  Response to Treatment: Decreased warmth in knees with ice massage, improved ability to exercises with less knee pain. Improved gait pattern with decreased left and right knee pain following treatment.  Verbalized good understanding of home exercises for left UE and LE's        PT Education - 01/01/16 1030    Education provided Yes   Education Details  HEP: use of ice massage to decrease knee pain, quad setting with appropirate activation of quadriceps in sitting   Person(s) Educated Patient   Methods Explanation;Demonstration;Verbal cues   Comprehension Verbalized understanding;Returned demonstration;Verbal cues required             PT Long Term Goals - 01/01/16 1916    PT LONG TERM GOAL #1   Title Patient will present with an QuickDASH score of <20% impaired by 01/04/16 to show significant improvement with Left shoulder function to improve ability to prepare food.   Baseline QuickDASH: 43% (moderate disability)   Status On-going   PT LONG TERM GOAL #2   Title Patient will present with a LEFS score of 40/64 by 01/04/16 to demonstrate significant improvement in lower extremity function and improved ability to perform prolonged standing/   Baseline LEFS: 26/64 (moderate-severe disability)   Status On-going   PT LONG TERM GOAL #3   Title Patient will be independent with HEP focusing on increasing muscular endurance, coordination, and strength by 01/04/16 to progress tissue healing after therapy is complete.   Baseline Dependent with exercise performance technique and progression.   Status  On-going               Plan - 01/01/16 1037    Clinical Impression Statement Patient with increased left knee pain today and is walking with straight cane for safety. She is going to contact MD for evaluation of left knee pain. Overall she is progressing with decreased right knee pain and imrpoving strength in right LE. She continues with decreased ability to perform active quad set and will need to work on this to assist with decreasing pain and improving function for ambulaiton.    Rehab Potential Good   PT Frequency 2x / week   PT Duration 6 weeks   PT Treatment/Interventions Passive range of motion;Neuromuscular re-education;Manual techniques;ADLs/Self Care Home Management;Ultrasound;Electrical Stimulation;Iontophoresis 4mg /ml Dexamethasone;Moist Heat;Therapeutic exercise;Therapeutic activities;Patient/family education   PT Next Visit Plan Advance knee and shoulder exercises as appropriate. Perform increased scapular retraction. use ice and rest for controlling pain in knees   PT  Home Exercise Plan quad setting, hip exercises as instructed for control, strength      Patient will benefit from skilled therapeutic intervention in order to improve the following deficits and impairments:  Decreased range of motion, Increased fascial restricitons, Impaired UE functional use, Increased muscle spasms, Decreased endurance, Decreased activity tolerance, Pain, Impaired flexibility, Hypomobility, Decreased strength, Impaired sensation  Visit Diagnosis: Muscle weakness (generalized)  Pain in right knee       G-Codes - 01/28/2016 1039    Functional Assessment Tool Used LEFS, strength, ROM, pain scale, quickDash, clinical judgment   Functional Limitation Mobility: Walking and moving around   Mobility: Walking and Moving Around Current Status [phone]) At least 40 percent but less than 60 percent impaired, limited or restricted   Mobility: Walking and Moving Around Goal Status [phone]) At least 1  percent but less than 20 percent impaired, limited or restricted      Problem List Patient Active Problem List   Diagnosis Date Noted  . 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    Jomarie Longs PT 2016/01/28, 7:20 PM  [facility] [address] [name], [address] Phone: [phone]   Fax:  [phone]  Name: Laura Hale MRN: [id number] Date of Birth: October 08, 1945

## 2016-01-02 DIAGNOSIS — M2392 Unspecified internal derangement of left knee: Secondary | ICD-10-CM | POA: Diagnosis not present

## 2016-01-02 DIAGNOSIS — M25562 Pain in left knee: Secondary | ICD-10-CM | POA: Diagnosis not present

## 2016-01-04 ENCOUNTER — Ambulatory Visit: Payer: PPO | Admitting: Physical Therapy

## 2016-01-04 ENCOUNTER — Encounter: Payer: Self-pay | Admitting: Physical Therapy

## 2016-01-04 DIAGNOSIS — M6281 Muscle weakness (generalized): Secondary | ICD-10-CM | POA: Diagnosis not present

## 2016-01-04 DIAGNOSIS — M25561 Pain in right knee: Secondary | ICD-10-CM

## 2016-01-04 DIAGNOSIS — M25512 Pain in left shoulder: Secondary | ICD-10-CM

## 2016-01-04 NOTE — Therapy (Signed)
[facility] [address], [name], [address] Phone: [phone]   Fax:  [phone]  Physical Therapy Treatment  Patient Details  Name: Laura Hale MRN: [id number] Date of Birth: 1945/10/01 Referring Provider: Milagros Evener, MD  Encounter Date: 01/04/2016      PT End of Session - 01/04/16 1926    Visit Number 11   Number of Visits 20   Date for PT Re-Evaluation 02/02/16   Authorization Type 11   Authorization Time Period 20 (G Code)   PT Start Time 0900   PT Stop Time 0945   PT Time Calculation (min) 45 min   Activity Tolerance Patient tolerated treatment well;No increased pain   Behavior During Therapy [facility] for tasks assessed/performed      Past Medical History  Diagnosis Date  . Arthritis     elbow, hands, neck   . Atypical childhood psychosis 2007    myoview -negative for ischemia with preserved LV function  . Colon polyps   . Hypercalcemia 2009    calcium  WNL 10.2 on 04-2015  . Hyperlipidemia   . Obesity   . Urinary incontinence   . Asthma     allergy induced asthma-only exacerbated by smells like perfume, smoke etc  . Complication of anesthesia     WITH ACDF IN JUNE 2016-PT STATES SHE LOST HER VOICE X 10 WEEKS DUE TO INTUBATION    Past Surgical History  Procedure Laterality Date  . Elbow surgery Left 04/2003    x2  & for repair & then remove all hardware, due to injury from a fall  . Hand surgery Right 2006    injury- then eventually lost remainder of thumb  . Abdominal hysterectomy    . Anterior cervical decomp/discectomy fusion N/A 01/31/2015    Procedure: ANTERIOR CERVICAL DECOMPRESSION/DISCECTOMY FUSION CERVICAL FIVE SIX;  Surgeon: Karie Chimera, MD;  Location: [facility];  Service: Neurosurgery;  Laterality: N/A;  . Colonoscopy    . 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 Orif humerus fracture Right 08/31/2015    Procedure: OPEN REDUCTION INTERNAL FIXATION (ORIF) PROXIMAL HUMERUS  FRACTURE w/ #2 fberwire;  Surgeon: Corky Mull, MD;  Location: [facility];  Service: Orthopedics;  Laterality: Right;    There were no vitals filed for this visit.      Subjective Assessment - 01/04/16 0906    Subjective Patient reports the knees are feeling much better compared to Monday stating she got a cortizone injection when going to MD appointment yesterday. States the knee was very stiff today and was late to her appointment because she was unable to don her shoes without increased pain. States she received a massage over the legs and the shoulders and reports it was much improved afterwards.   Limitations House hold activities;Lifting;Other (comment)   Patient Stated Goals Would like to be able to drive and use R shoulder without pain. walk normally without knee pain and be able to exercise without right knee pain.    Currently in Pain? Yes   Pain Score 1    Pain Location Knee   Pain Orientation Right   Pain Descriptors / Indicators Aching   Pain Type Acute pain   Pain Onset More than a month ago      Objective: Observation: Gait: ambulating into clinic using straight cane, decreased weight bearing left LE, mild swelling lateral aspect right and left knee,  Measurement: 165 AROM overhead left shoulder  flexion in supine Palpation: + point tenderness along LCL left knee and lateral aspect right knee, along joint line of knees bilaterally, and tightness along bilateral quadriceps muscle bellies.    Outcome Measures: QuickDASH: 43%, LEFS: 26/64  Treatment: Therapeutic Exercise: Patient performed exercises with guidance, verbal and tactile cues and demonstration of therapist:  Supine lying: ball between knees with hip adduction and glute sets 2 x 10 reps Sitting knee AROM: with ball underneath the foot bilaterally -- x 3 min Supine ball roll outs on yellow physioball -- x 3 min Scapular retractions in supine -- x 15   Manual therapy: With patient supine lying: Electrical  stimulation: High volt per clinical protocol for muscle spasms: (2) large electrodes applied to right knee over the lateral poles of the patella and [place] stimulation: 10sec on/10sec off with 2 large electrodes placed over the quadriceps left shoulder/RTC musculature STM performed with patient supine to decrease pain and spasms.  Response to Treatment: Decreased pain and spasms noted after modalities and manual therapy treatment indicating improved muscular guarding and spasms. Reproduction of symptoms after performing knee AROM in supine when moving into knee flexion indicating increased inflammation and muscle guarding.         PT Education - 01/04/16 1924    Education provided Yes   Education Details HEP: continue knee AROM exercise to decrease pain; Educated to avoid excessive standing onto the affected LEs   Person(s) Educated Patient   Methods Explanation;Demonstration   Comprehension Returned demonstration;Verbalized understanding             PT Long Term Goals - 01/04/16 1100    PT LONG TERM GOAL #1   Title Patient will present with an QuickDASH score of <20% impaired by 02/02/16 to show significant improvement with Left shoulder function to improve ability to prepare food.   Baseline QuickDASH: 43% (moderate disability)   Status On-going   PT LONG TERM GOAL #2   Title Patient will present with a LEFS score of 40/64 by 02/02/16 to demonstrate significant improvement in lower extremity function and improved ability to perform prolonged standing/   Baseline LEFS: 26/64 (moderate-severe disability)   Status On-going   PT LONG TERM GOAL #3   Title Patient will be independent with HEP focusing on increasing muscular endurance, coordination, and strength by 02/02/16 to progress tissue healing after therapy is complete.   Baseline Dependent with exercise performance technique and progression.   Status On-going               Plan - 01/04/16 1755    Clinical Impression  Statement Patient demonstrates knee and shoulder dysfunction as indicated by decreased functional strength, decreased LEFS score, QuickDASH scoring, and inability to perform functional tasks without increease in pain. Patient is overall improving with the R knee but continues to experience increased pain with standing and weight bearing positions indicating increased inflammation, muscular strength and muscular endurance. Patient will continue to benefit from further skilled therapy focused on improving limitations to return to prior level of funciton.    Rehab Potential Good   PT Frequency 2x / week   PT Duration 6 weeks   PT Treatment/Interventions Passive range of motion;Neuromuscular re-education;Manual techniques;ADLs/Self Care Home Management;Ultrasound;Electrical Stimulation;Iontophoresis 4mg /ml Dexamethasone;Moist Heat;Therapeutic exercise;Therapeutic activities;Patient/family education   PT Next Visit Plan Advance knee and shoulder exercises as appropriate. Perform increased scapular retraction. use ice and rest for controlling pain in knees   PT Home Exercise Plan quad setting, hip exercises as instructed for control, strength  Patient will benefit from skilled therapeutic intervention in order to improve the following deficits and impairments:  Decreased range of motion, Increased fascial restricitons, Impaired UE functional use, Increased muscle spasms, Decreased endurance, Decreased activity tolerance, Pain, Impaired flexibility, Hypomobility, Decreased strength, Impaired sensation  Visit Diagnosis: Muscle weakness (generalized) - Plan: PT plan of care cert/re-cert  Pain in right knee - Plan: PT plan of care cert/re-cert  Pain in left shoulder - Plan: PT plan of care cert/re-cert     Problem List Patient Active Problem List   Diagnosis Date Noted  . 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    Blythe Stanford, SPT 01/05/2016, 12:42 PM  [facility] [address] [name], [address] Phone: [phone]   Fax:  [phone]  Name: ZENORA KAHANA MRN: [id number] Date of Birth: Aug 27, 1945

## 2016-01-09 ENCOUNTER — Ambulatory Visit: Payer: PPO | Admitting: Physical Therapy

## 2016-01-09 ENCOUNTER — Encounter: Payer: Self-pay | Admitting: Physical Therapy

## 2016-01-09 DIAGNOSIS — M25561 Pain in right knee: Secondary | ICD-10-CM

## 2016-01-09 DIAGNOSIS — M6281 Muscle weakness (generalized): Secondary | ICD-10-CM | POA: Diagnosis not present

## 2016-01-09 DIAGNOSIS — M25512 Pain in left shoulder: Secondary | ICD-10-CM

## 2016-01-09 NOTE — Therapy (Signed)
[facility] [address], [name], [address] Phone: [phone]   Fax:  [phone]  Physical Therapy Treatment  Patient Details  Name: Laura Hale MRN: [id number] Date of Birth: March 19, 1946 Referring Provider: Milagros Evener, MD  Encounter Date: 01/09/2016      PT End of Session - 01/09/16 1520    Visit Number 12   Number of Visits 20   Date for PT Re-Evaluation 02/02/16   Authorization Type 12   Authorization Time Period 20 (G Code)   PT Start Time 0945   PT Stop Time 1028   PT Time Calculation (min) 43 min   Activity Tolerance Patient tolerated treatment well;No increased pain   Behavior During Therapy [facility] for tasks assessed/performed      Past Medical History  Diagnosis Date  . Arthritis     elbow, hands, neck   . Atypical childhood psychosis 2007    myoview -negative for ischemia with preserved LV function  . Colon polyps   . Hypercalcemia 2009    calcium  WNL 10.2 on 04-2015  . Hyperlipidemia   . Obesity   . Urinary incontinence   . Asthma     allergy induced asthma-only exacerbated by smells like perfume, smoke etc  . Complication of anesthesia     WITH ACDF IN JUNE 2016-PT STATES SHE LOST HER VOICE X 10 WEEKS DUE TO INTUBATION    Past Surgical History  Procedure Laterality Date  . Elbow surgery Left 04/2003    x2  & for repair & then remove all hardware, due to injury from a fall  . Hand surgery Right 2006    injury- then eventually lost remainder of thumb  . Abdominal hysterectomy    . Anterior cervical decomp/discectomy fusion N/A 01/31/2015    Procedure: ANTERIOR CERVICAL DECOMPRESSION/DISCECTOMY FUSION CERVICAL FIVE SIX;  Surgeon: Karie Chimera, MD;  Location: [facility];  Service: Neurosurgery;  Laterality: N/A;  . Colonoscopy    . 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 Orif humerus fracture Right 08/31/2015    Procedure: OPEN REDUCTION INTERNAL FIXATION (ORIF) PROXIMAL HUMERUS  FRACTURE w/ #2 fberwire;  Surgeon: Corky Mull, MD;  Location: [facility];  Service: Orthopedics;  Laterality: Right;    There were no vitals filed for this visit.      Subjective Assessment - 01/09/16 0953    Subjective Patient reports the right knee is feeling better and states shes having difficulty with walking stating the left knee is "giving away" . She states a lot of pain and instability in the left knee and requires to use a walker to ambulate safely.   Limitations House hold activities;Lifting;Other (comment)   Patient Stated Goals Would like to be able to drive and use R shoulder without pain. walk normally without knee pain and be able to exercise without right knee pain.    Currently in Pain? Yes   Pain Score 1    Pain Location Knee  L shoulder pain : 0/10   Pain Orientation Right   Pain Descriptors / Indicators Aching   Pain Type Acute pain   Pain Onset More than a month ago      Objective: Observation: Gait: ambulating into clinic using front wheeled walker, decreased weight bearing left LE, mild swelling lateral aspect right and left knee,  Measurement: 165 AROM overhead left shoulder flexion in supine Palpation: + point tenderness along LCL left knee and lateral aspect  right knee, along joint line of knees bilaterally, and tightness along bilateral quadriceps muscle bellies.   Treatment: Therapeutic Exercise: Patient performed exercises with guidance, verbal and tactile cues and demonstration of therapist:  PROM in supine knee flexion/extension -- x 2 min (On left only Supine ball roll outs on yellow physioball -- x 2 min (On right only) Educated on proper steps to transfer from sitting to standing -- x5  Left UE:  Serratus punches in supine -- x15 Dynamic stabilization in supine -- 2 x 30 sec Latissimus dorsi overhead stretch in supine -- 2 x 30sec  Manual therapy: With patient supine lying: Electrical stimulation: High volt per clinical protocol for  muscle spasms: (2) large electrodes applied to right knee over the lateral poles of the patella and [place] stimulation: 10sec on/10sec off with 2 large electrodes placed over the quadriceps. During manual therapy, cryotherapy large cold pack placed over knee to decrease pain and spasms. Skin checked with no adverse effects noted.  left shoulder/RTC musculature STM performed with patient supine to decrease pain and spasms.  Response to Treatment: Decreased symptoms by 50% after performing modalities and manual therapy indicating decreased spasms/guarding. Good demonstration of serratus punches in supine requiring minimal cueing for proper muscular activation.             PT Education - 01/09/16 1519    Education provided Yes   Education Details HEP: continue performing AROM exercises throughout pain free range; Educated to avoid standing and weight bearing activities   Person(s) Educated Patient   Methods Demonstration;Explanation   Comprehension Verbalized understanding;Returned demonstration             PT Long Term Goals - 01/04/16 1100    PT LONG TERM GOAL #1   Title Patient will present with an QuickDASH score of <20% impaired by 02/02/16 to show significant improvement with Left shoulder function to improve ability to prepare food.   Baseline QuickDASH: 43% (moderate disability)   Status On-going   PT LONG TERM GOAL #2   Title Patient will present with a LEFS score of 40/64 by 02/02/16 to demonstrate significant improvement in lower extremity function and improved ability to perform prolonged standing/   Baseline LEFS: 26/64 (moderate-severe disability)   Status On-going   PT LONG TERM GOAL #3   Title Patient will be independent with HEP focusing on increasing muscular endurance, coordination, and strength by 02/02/16 to progress tissue healing after therapy is complete.   Baseline Dependent with exercise performance technique and progression.   Status On-going                Plan - 01/09/16 1949    Clinical Impression Statement Patient demonstrates improvement with right knee and left shoulder pain with current treatment. Her left knee pain is primary limitation that prevents her from walking without difficulty and prevents progression of LE exercises.  Focused treatment on decreasing knee pain and increasing shoulder stabilization to help manage symptoms. Patient will continue to benefit from further skilled therapy to decrease pain to allow for the performance of functional exercises.    Rehab Potential Good   PT Frequency 2x / week   PT Duration 6 weeks   PT Treatment/Interventions Passive range of motion;Neuromuscular re-education;Manual techniques;ADLs/Self Care Home Management;Ultrasound;Electrical Stimulation;Iontophoresis 4mg /ml Dexamethasone;Moist Heat;Therapeutic exercise;Therapeutic activities;Patient/family education   PT Next Visit Plan Advance knee and shoulder exercises as appropriate. Perform increased scapular retraction. use ice and rest for controlling pain in knees   PT Home Exercise Plan quad  setting, hip exercises as instructed for control, strength      Patient will benefit from skilled therapeutic intervention in order to improve the following deficits and impairments:  Decreased range of motion, Increased fascial restricitons, Impaired UE functional use, Increased muscle spasms, Decreased endurance, Decreased activity tolerance, Pain, Impaired flexibility, Hypomobility, Decreased strength, Impaired sensation  Visit Diagnosis: Muscle weakness (generalized)  Pain in right knee  Pain in left shoulder     Problem List Patient Active Problem List   Diagnosis Date Noted  . 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    Blythe Stanford, SPT 01/09/2016, 7:54 PM  [facility] [address] [name], [address] Phone: [phone]   Fax:  [phone]  Name:  Laura Hale MRN: [id number] Date of Birth: 1946-03-27

## 2016-01-11 ENCOUNTER — Ambulatory Visit: Payer: PPO | Attending: Surgery | Admitting: Physical Therapy

## 2016-01-11 ENCOUNTER — Encounter: Payer: Self-pay | Admitting: Physical Therapy

## 2016-01-11 DIAGNOSIS — M25561 Pain in right knee: Secondary | ICD-10-CM | POA: Diagnosis not present

## 2016-01-11 DIAGNOSIS — M6281 Muscle weakness (generalized): Secondary | ICD-10-CM

## 2016-01-11 DIAGNOSIS — M25512 Pain in left shoulder: Secondary | ICD-10-CM

## 2016-01-11 NOTE — Therapy (Signed)
[facility] [address], [name], [address] Phone: [phone]   Fax:  [phone]  Physical Therapy Treatment  Patient Details  Name: Laura Hale MRN: [id number] Date of Birth: [birth year] Referring Provider: Milagros Evener, MD  Encounter Date: 01/11/2016      PT End of Session - 01/11/16 0819    Visit Number 13   Number of Visits 20   Date for PT Re-Evaluation 02/02/16   Authorization Type 13   Authorization Time Period 20 (G Code)   PT Start Time 0945   PT Stop Time [id number]   PT Time Calculation (min) 44 min   Activity Tolerance Patient tolerated treatment well;No increased pain   Behavior During Therapy [facility] for tasks assessed/performed      Past Medical History  Diagnosis Date  . Arthritis     elbow, hands, neck   . Atypical childhood psychosis 2007    myoview -negative for ischemia with preserved LV function  . Colon polyps   . Hypercalcemia 2009    calcium  WNL 10.2 on 04-2015  . Hyperlipidemia   . Obesity   . Urinary incontinence   . Asthma     allergy induced asthma-only exacerbated by smells like perfume, smoke etc  . Complication of anesthesia     WITH ACDF IN JUNE 2016-PT STATES SHE LOST HER VOICE X 10 WEEKS DUE TO INTUBATION    Past Surgical History  Procedure Laterality Date  . Elbow surgery Left 04/2003    x2  & for repair & then remove all hardware, due to injury from a fall  . Hand surgery Right 2006    injury- then eventually lost remainder of thumb  . Abdominal hysterectomy    . Anterior cervical decomp/discectomy fusion N/A 01/31/2015    Procedure: ANTERIOR CERVICAL DECOMPRESSION/DISCECTOMY FUSION CERVICAL FIVE SIX;  Surgeon: Karie Chimera, MD;  Location: [facility];  Service: Neurosurgery;  Laterality: N/A;  . Colonoscopy    . 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 Orif humerus fracture Right 08/31/2015    Procedure: OPEN REDUCTION INTERNAL FIXATION (ORIF) PROXIMAL HUMERUS  FRACTURE w/ #2 fberwire;  Surgeon: Corky Mull, MD;  Location: [facility];  Service: Orthopedics;  Laterality: Right;    There were no vitals filed for this visit.      Subjective Assessment - 01/11/16 0952    Subjective Patient reports the right knee is feeling much better today but states she was unable to bend her left knee without severe increase of pain after the prevoius treatment. States she has experienced 5 episodes of burning pain radiating down both legs when laying down which is relieved after standing.    Limitations House hold activities;Lifting;Other (comment)   Patient Stated Goals Would like to be able to drive and use R shoulder without pain. walk normally without knee pain and be able to exercise without right knee pain.    Currently in Pain? No/denies   Pain Onset More than a month ago      Objective: Observation: Gait: ambulating into clinic using single point cane, decreased weight bearing left LE, swelling lateral aspects of right and left knee,  Measurement: 165 AROM overhead left shoulder flexion in supine Palpation: + point tenderness along LCL left knee and lateral aspect right knee, along joint line of knees bilaterally, and tightness along bilateral quadriceps muscle bellies.   Treatment: Therapeutic Exercise: Patient performed exercises with guidance, verbal and tactile  cues and demonstration of therapist:  Serratus punches in supine -- x15, 2# Overhead shoulder flexion in supine -- x 15, 2# Dynamic stabilization in supine -- 2 x 30 sec 2# Scapular retraction supine -- x15  Modalities: Electrical stimulation: With patient supine lying: Right LE: High volt per clinical protocol for muscle spasms: (2) large electrodes applied to right knee over the lateral poles of the patella and [place] stimulation: 10sec on/10sec off with 2 large electrodes placed over the quadriceps. cryotherapy large cold pack placed over left knee to decrease pain and spasms. Skin  checked with no adverse effects noted.  Manual therapy: left shoulder/RTC musculature STM performed utilizing deep myofascial and superficial technique with patient positioned in supine to decrease pain and spasms.  Response to Treatment: Decreased symptoms by 75% after performing modalities to right knee and manual therapy to left shoulder indicating decreased spasms/guarding. Good demonstration of serratus punches in supine requiring minimal cueing for proper muscular activation. Patient able to raise left arm above shoulder level with less difficulty and pain following treatment         PT Education - 01/11/16 1818    Education provided Yes   Education Details HEP: continue to perform AROM within pain-free range; Discussed LE symptoms and relation to back with possible correlation   Person(s) Educated Patient   Methods Explanation;Demonstration   Comprehension Verbalized understanding;Returned demonstration             PT Long Term Goals - 01/04/16 1100    PT LONG TERM GOAL #1   Title Patient will present with an QuickDASH score of <20% impaired by 02/02/16 to show significant improvement with Left shoulder function to improve ability to prepare food.   Baseline QuickDASH: 43% (moderate disability)   Status On-going   PT LONG TERM GOAL #2   Title Patient will present with a LEFS score of 40/64 by 02/02/16 to demonstrate significant improvement in lower extremity function and improved ability to perform prolonged standing/   Baseline LEFS: 26/64 (moderate-severe disability)   Status On-going   PT LONG TERM GOAL #3   Title Patient will be independent with HEP focusing on increasing muscular endurance, coordination, and strength by 02/02/16 to progress tissue healing after therapy is complete.   Baseline Dependent with exercise performance technique and progression.   Status On-going               Plan - 01/11/16 0820    Clinical Impression Statement Patient  demonstrates improved resting pain upon entering the clinic indicating decreased guarding/spasms and functional carryover between visits. Focused decreasing knee spasms and pain and addressing L shoulder limitations during modalities treatment to increase functional capactiy and patient will benefit from further skilled therapy to return to prior level of function.    Rehab Potential Good   PT Frequency 2x / week   PT Duration 6 weeks   PT Treatment/Interventions Passive range of motion;Neuromuscular re-education;Manual techniques;ADLs/Self Care Home Management;Ultrasound;Electrical Stimulation;Iontophoresis 4mg /ml Dexamethasone;Moist Heat;Therapeutic exercise;Therapeutic activities;Patient/family education   PT Next Visit Plan Advance knee and shoulder exercises as appropriate. use ice and rest for controlling pain in knees   PT Home Exercise Plan quad setting, hip exercises as instructed for control, strength      Patient will benefit from skilled therapeutic intervention in order to improve the following deficits and impairments:  Decreased range of motion, Increased fascial restricitons, Impaired UE functional use, Increased muscle spasms, Decreased endurance, Decreased activity tolerance, Pain, Impaired flexibility, Hypomobility, Decreased strength, Impaired sensation  Visit Diagnosis: Muscle weakness (generalized)  Pain in right knee  Pain in left shoulder     Problem List Patient Active Problem List   Diagnosis Date Noted  . 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    Blythe Stanford, SPT 01/12/2016, 8:25 AM  [facility] [address] [name], [address] Phone: [phone]   Fax:  [phone]  Name: Laura Hale MRN: [id number] Date of Birth: 05/16/1946

## 2016-01-12 ENCOUNTER — Other Ambulatory Visit: Payer: Self-pay | Admitting: Unknown Physician Specialty

## 2016-01-12 DIAGNOSIS — M2392 Unspecified internal derangement of left knee: Secondary | ICD-10-CM

## 2016-01-16 ENCOUNTER — Ambulatory Visit (HOSPITAL_COMMUNITY)
Admission: RE | Admit: 2016-01-16 | Discharge: 2016-01-16 | Disposition: A | Discharge status: Home / Self Care | Payer: PPO | Source: Ambulatory Visit | Attending: Unknown Physician Specialty | Admitting: Unknown Physician Specialty

## 2016-01-16 ENCOUNTER — Encounter: Payer: Self-pay | Admitting: Physical Therapy

## 2016-01-16 ENCOUNTER — Ambulatory Visit: Payer: PPO | Admitting: Physical Therapy

## 2016-01-16 DIAGNOSIS — M25562 Pain in left knee: Secondary | ICD-10-CM | POA: Diagnosis not present

## 2016-01-16 DIAGNOSIS — R609 Edema, unspecified: Secondary | ICD-10-CM | POA: Diagnosis not present

## 2016-01-16 DIAGNOSIS — M6281 Muscle weakness (generalized): Secondary | ICD-10-CM

## 2016-01-16 DIAGNOSIS — M2392 Unspecified internal derangement of left knee: Secondary | ICD-10-CM | POA: Diagnosis not present

## 2016-01-16 DIAGNOSIS — M1712 Unilateral primary osteoarthritis, left knee: Secondary | ICD-10-CM | POA: Diagnosis not present

## 2016-01-16 DIAGNOSIS — M25561 Pain in right knee: Secondary | ICD-10-CM

## 2016-01-16 DIAGNOSIS — M25462 Effusion, left knee: Secondary | ICD-10-CM | POA: Diagnosis not present

## 2016-01-16 DIAGNOSIS — M7122 Synovial cyst of popliteal space [Baker], left knee: Secondary | ICD-10-CM | POA: Insufficient documentation

## 2016-01-16 DIAGNOSIS — M25512 Pain in left shoulder: Secondary | ICD-10-CM

## 2016-01-16 DIAGNOSIS — M179 Osteoarthritis of knee, unspecified: Secondary | ICD-10-CM | POA: Diagnosis not present

## 2016-01-16 NOTE — Therapy (Signed)
[facility] [address], [name], [address] Phone: [phone]   Fax:  [phone]  Physical Therapy Treatment  Patient Details  Name: Laura Hale MRN: [id number] Date of Birth: Dec 24, 1945 Referring Provider: Milagros Evener, MD  Encounter Date: 01/16/2016      PT End of Session - 01/16/16 1803    Visit Number 14   Number of Visits 20   Date for PT Re-Evaluation 02/02/16   Authorization Type 14   Authorization Time Period 20 (G Code)   PT Start Time 0930   PT Stop Time [id number]   PT Time Calculation (min) 44 min   Activity Tolerance Patient tolerated treatment well;No increased pain   Behavior During Therapy [facility] for tasks assessed/performed      Past Medical History  Diagnosis Date  . Arthritis     elbow, hands, neck   . Atypical childhood psychosis 2007    myoview -negative for ischemia with preserved LV function  . Colon polyps   . Hypercalcemia 2009    calcium  WNL 10.2 on 04-2015  . Hyperlipidemia   . Obesity   . Urinary incontinence   . Asthma     allergy induced asthma-only exacerbated by smells like perfume, smoke etc  . Complication of anesthesia     WITH ACDF IN JUNE 2016-PT STATES SHE LOST HER VOICE X 10 WEEKS DUE TO INTUBATION    Past Surgical History  Procedure Laterality Date  . Elbow surgery Left 04/2003    x2  & for repair & then remove all hardware, due to injury from a fall  . Hand surgery Right 2006    injury- then eventually lost remainder of thumb  . Abdominal hysterectomy    . Anterior cervical decomp/discectomy fusion N/A 01/31/2015    Procedure: ANTERIOR CERVICAL DECOMPRESSION/DISCECTOMY FUSION CERVICAL FIVE SIX;  Surgeon: Karie Chimera, MD;  Location: [facility];  Service: Neurosurgery;  Laterality: N/A;  . Colonoscopy    . 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 Orif humerus fracture Right 08/31/2015    Procedure: OPEN REDUCTION INTERNAL FIXATION (ORIF) PROXIMAL HUMERUS  FRACTURE w/ #2 fberwire;  Surgeon: Corky Mull, MD;  Location: [facility];  Service: Orthopedics;  Laterality: Right;    There were no vitals filed for this visit.      Subjective Assessment - 01/16/16 0935    Subjective Patient reports improved knee pain and function stating no pain over the past two days. Reports she's going to see her MD this Friday and going for an MRI for her knees later today.   Limitations House hold activities;Lifting;Other (comment)   Patient Stated Goals Would like to be able to drive and use R shoulder without pain. walk normally without knee pain and be able to exercise without right knee pain.    Currently in Pain? No/denies   Pain Onset More than a month ago      Objective: Observation: Gait: ambulating into clinic without AD, decreased weight bearing left LE, swelling lateral aspects of right and left knee,   Palpation: + point tenderness along LCL left knee, along medial hamstring tendon, along joint line of knees bilaterally, and tightness along bilateral quadriceps muscle bellies (More on R versus left)   Treatment: Therapeutic Exercise: Patient performed exercises with guidance, verbal and tactile cues and demonstration of therapist: Therapist assisted knee ROM in supine with isometric hamstring contractions in knee flexion and knee extension position --  5 x 5 with 5 sec hamstring holds Bridges in supine-- x 15 Ball squeezes, glute squeezes  In supine with bal-- x 15 Supine clamshells green band -- [employer] squeezes in sitting -- x15  SLR in supine --2 x 15 performed bilaterally Terminal knee stance in standing-- x10  Manual therapy: STM utilizing deep and superficial techniques  performed to bilateral hamstring tendons to decreased pain, spasms and tightness with patient positioned in long sitting   Response to Treatment: Decreased pain and spasms by 50% after performing STM to the hamstrings indicating decreased muscle guarding and pain.  Good demonstration of SLR exercise in supine requiring slight knee flexion to perform pain free.           PT Education - 01/16/16 1802    Education provided Yes   Education Details HEP: SLR, terminal knee extension in standing   Person(s) Educated Patient   Methods Explanation;Demonstration   Comprehension Verbalized understanding;Returned demonstration             PT Long Term Goals - 01/04/16 1100    PT LONG TERM GOAL #1   Title Patient will present with an QuickDASH score of <20% impaired by 02/02/16 to show significant improvement with Left shoulder function to improve ability to prepare food.   Baseline QuickDASH: 43% (moderate disability)   Status On-going   PT LONG TERM GOAL #2   Title Patient will present with a LEFS score of 40/64 by 02/02/16 to demonstrate significant improvement in lower extremity function and improved ability to perform prolonged standing/   Baseline LEFS: 26/64 (moderate-severe disability)   Status On-going   PT LONG TERM GOAL #3   Title Patient will be independent with HEP focusing on increasing muscular endurance, coordination, and strength by 02/02/16 to progress tissue healing after therapy is complete.   Baseline Dependent with exercise performance technique and progression.   Status On-going               Plan - 01/16/16 1803    Clinical Impression Statement Patient demonstrates improved resting pain today allowing for the performance of further knee exercises demonstrating ability to perform therapeutic activtiy without onset of symptoms indicating functional carryover between visits. Patient will benefit from further skilled therapy aimed at improving knee and shoulder function to return to prior level of function.    Rehab Potential Good   PT Frequency 2x / week   PT Duration 6 weeks   PT Treatment/Interventions Passive range of motion;Neuromuscular re-education;Manual techniques;ADLs/Self Care Home  Management;Ultrasound;Electrical Stimulation;Iontophoresis 4mg /ml Dexamethasone;Moist Heat;Therapeutic exercise;Therapeutic activities;Patient/family education   PT Next Visit Plan Advance knee and shoulder exercises as appropriate.    PT Home Exercise Plan quad setting, hip exercises as instructed for control, strength      Patient will benefit from skilled therapeutic intervention in order to improve the following deficits and impairments:  Decreased range of motion, Increased fascial restricitons, Impaired UE functional use, Increased muscle spasms, Decreased endurance, Decreased activity tolerance, Pain, Impaired flexibility, Hypomobility, Decreased strength, Impaired sensation  Visit Diagnosis: Muscle weakness (generalized)  Pain in right knee  Pain in left shoulder     Problem List Patient Active Problem List   Diagnosis Date Noted  . 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    Blythe Stanford, SPT 01/16/2016, 6:07 PM  [facility] [address] [name], [address] Phone: [phone]   Fax:  [phone]  Name: Laura Hale MRN: [id number] Date of Birth: [birth year]

## 2016-01-18 ENCOUNTER — Encounter: Payer: Self-pay | Admitting: Physical Therapy

## 2016-01-18 ENCOUNTER — Ambulatory Visit: Payer: PPO | Admitting: Physical Therapy

## 2016-01-18 DIAGNOSIS — M25561 Pain in right knee: Secondary | ICD-10-CM

## 2016-01-18 DIAGNOSIS — M6281 Muscle weakness (generalized): Secondary | ICD-10-CM

## 2016-01-18 DIAGNOSIS — M25512 Pain in left shoulder: Secondary | ICD-10-CM

## 2016-01-18 NOTE — Therapy (Signed)
[facility] [address], [name], [address] Phone: [phone]   Fax:  [phone]  Physical Therapy Treatment  Patient Details  Name: Laura Hale MRN: [id number] Date of Birth: May 15, 1946 Referring Provider: Milagros Evener, MD  Encounter Date: 01/18/2016      PT End of Session - 01/18/16 1009    Visit Number 15   Number of Visits 20   Date for PT Re-Evaluation 02/02/16   Authorization Type 15   Authorization Time Period 20 (G Code)   PT Start Time [phone]   PT Stop Time 1015   PT Time Calculation (min) 44 min   Activity Tolerance Patient tolerated treatment well;No increased pain   Behavior During Therapy [facility] for tasks assessed/performed      Past Medical History  Diagnosis Date  . Arthritis     elbow, hands, neck   . Atypical childhood psychosis 2007    myoview -negative for ischemia with preserved LV function  . Colon polyps   . Hypercalcemia 2009    calcium  WNL 10.2 on 04-2015  . Hyperlipidemia   . Obesity   . Urinary incontinence   . Asthma     allergy induced asthma-only exacerbated by smells like perfume, smoke etc  . Complication of anesthesia     WITH ACDF IN JUNE 2016-PT STATES SHE LOST HER VOICE X 10 WEEKS DUE TO INTUBATION    Past Surgical History  Procedure Laterality Date  . Elbow surgery Left 04/2003    x2  & for repair & then remove all hardware, due to injury from a fall  . Hand surgery Right 2006    injury- then eventually lost remainder of thumb  . Abdominal hysterectomy    . Anterior cervical decomp/discectomy fusion N/A 01/31/2015    Procedure: ANTERIOR CERVICAL DECOMPRESSION/DISCECTOMY FUSION CERVICAL FIVE SIX;  Surgeon: Karie Chimera, MD;  Location: [facility];  Service: Neurosurgery;  Laterality: N/A;  . Colonoscopy    . 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 Orif humerus fracture Right 08/31/2015    Procedure: OPEN REDUCTION INTERNAL FIXATION (ORIF) PROXIMAL HUMERUS  FRACTURE w/ #2 fberwire;  Surgeon: Corky Mull, MD;  Location: [facility];  Service: Orthopedics;  Laterality: Right;    There were no vitals filed for this visit.      Subjective Assessment - 01/18/16 0935    Subjective Patient reports slight increase in aggravation of pain in the left knee from increased standing. Reports no current resting pain in the right knee.    Limitations House hold activities;Lifting;Other (comment)   Patient Stated Goals Would like to be able to drive and use R shoulder without pain. walk normally without knee pain and be able to exercise without right knee pain.    Currently in Pain? No/denies   Pain Score 1    Pain Location Knee   Pain Orientation Left   Pain Onset More than a month ago   Pain Frequency Constant      Objective: Observation: Gait: ambulating into clinic without AD today, swelling along medial and lateral aspects of right and left knee,   Palpation: + point tenderness along medial hamstring tendon,  joint line tenderness of knees bilaterally, and tightness/pain along bilateral quadriceps muscle bellies  Treatment: Therapeutic Exercise: Patient performed exercises with guidance, verbal and tactile cues and demonstration of therapist: Therapist assisted knee ROM in supine with isometric hamstring contractions in  knee extension position --  5 x 5 with 5 sec hamstring holds Bridges in supine-- x 15 with ball squeezes Seated clamshells with green band -- [employer] squeeze/glute squeeze in sitting -- x15  Terminal knee stance in standing-- x10 Tandem stance forward/backward weight shifts in standing (retro step)-- x15 bilaterally Scapular retraction in supine -- x15 with manual resistance  Manual therapy: STM utilizing deep and superficial techniquesperformed to medial hamstring tendons bilaterally to decreased pain and guarding with patient positioned in supine.  Response to Treatment: Decreased pain and spasms by 33% after performing  STM to the hamstrings indicating decrease muscle guarding and pain. No aggravation of knee symptoms when performing standing exercises. Good demonstration of Retro step in standing requiring minimal verbal cueing on proper patellofemoral joint positoning to perform with proper technique.          PT Education - 01/18/16 1544    Education provided Yes   Education Details HEP: Retro step: Widened tandem weight shifts forward/backward   Person(s) Educated Patient   Methods Demonstration;Explanation   Comprehension Verbalized understanding;Returned demonstration             PT Long Term Goals - 01/04/16 1100    PT LONG TERM GOAL #1   Title Patient will present with an QuickDASH score of <20% impaired by 02/02/16 to show significant improvement with Left shoulder function to improve ability to prepare food.   Baseline QuickDASH: 43% (moderate disability)   Status On-going   PT LONG TERM GOAL #2   Title Patient will present with a LEFS score of 40/64 by 02/02/16 to demonstrate significant improvement in lower extremity function and improved ability to perform prolonged standing/   Baseline LEFS: 26/64 (moderate-severe disability)   Status On-going   PT LONG TERM GOAL #3   Title Patient will be independent with HEP focusing on increasing muscular endurance, coordination, and strength by 02/02/16 to progress tissue healing after therapy is complete.   Baseline Dependent with exercise performance technique and progression.   Status On-going               Plan - 01/18/16 1545    Clinical Impression Statement Patient making progress towards long term goals with ability to tolerate advancement in exercise progression without increase in symptoms indicating functional carryover between visits. Although patient is improving, she continues to demonstrate increased pain and spasms along the hamstrings/patellofemoral joint and will benefit from further skilled therapy to return to prior  level of function.    Rehab Potential Good   PT Frequency 2x / week   PT Duration 6 weeks   PT Treatment/Interventions Passive range of motion;Neuromuscular re-education;Manual techniques;ADLs/Self Care Home Management;Ultrasound;Electrical Stimulation;Iontophoresis 4mg /ml Dexamethasone;Moist Heat;Therapeutic exercise;Therapeutic activities;Patient/family education   PT Next Visit Plan Advance knee and shoulder exercises as appropriate.    PT Home Exercise Plan quad setting, hip exercises as instructed for control, strength      Patient will benefit from skilled therapeutic intervention in order to improve the following deficits and impairments:  Decreased range of motion, Increased fascial restricitons, Impaired UE functional use, Increased muscle spasms, Decreased endurance, Decreased activity tolerance, Pain, Impaired flexibility, Hypomobility, Decreased strength, Impaired sensation  Visit Diagnosis: Pain in right knee  Muscle weakness (generalized)  Pain in left shoulder     Problem List Patient Active Problem List   Diagnosis Date Noted  . 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    Blythe Stanford, SPT 01/18/2016, 3:50 PM  [facility] [address]  [place], [name], [address] Phone: [phone]   Fax:  [phone]  Name: Laura Hale MRN: [id number] Date of Birth: 1946-01-07

## 2016-01-19 DIAGNOSIS — M1712 Unilateral primary osteoarthritis, left knee: Secondary | ICD-10-CM | POA: Diagnosis not present

## 2016-01-19 DIAGNOSIS — M7582 Other shoulder lesions, left shoulder: Secondary | ICD-10-CM | POA: Diagnosis not present

## 2016-01-19 DIAGNOSIS — S83271D Complex tear of lateral meniscus, current injury, right knee, subsequent encounter: Secondary | ICD-10-CM | POA: Diagnosis not present

## 2016-01-19 DIAGNOSIS — M23301 Other meniscus derangements, unspecified lateral meniscus, left knee: Secondary | ICD-10-CM | POA: Diagnosis not present

## 2016-01-19 DIAGNOSIS — M1711 Unilateral primary osteoarthritis, right knee: Secondary | ICD-10-CM | POA: Diagnosis not present

## 2016-01-19 HISTORY — DX: Unilateral primary osteoarthritis, left knee: M17.12

## 2016-01-23 ENCOUNTER — Ambulatory Visit: Payer: PPO | Admitting: Anesthesiology

## 2016-01-23 ENCOUNTER — Encounter
Admission: RE | Disposition: A | Discharge status: Home / Self Care | Payer: Self-pay | Source: Ambulatory Visit | Attending: Surgery

## 2016-01-23 ENCOUNTER — Encounter: Payer: PPO | Admitting: Physical Therapy

## 2016-01-23 ENCOUNTER — Ambulatory Visit
Admission: RE | Admit: 2016-01-23 | Discharge: 2016-01-23 | Disposition: A | Discharge status: Home / Self Care | Payer: PPO | Source: Ambulatory Visit | Attending: Surgery | Admitting: Surgery

## 2016-01-23 DIAGNOSIS — Z90710 Acquired absence of both cervix and uterus: Secondary | ICD-10-CM | POA: Diagnosis not present

## 2016-01-23 DIAGNOSIS — Z79899 Other long term (current) drug therapy: Secondary | ICD-10-CM | POA: Diagnosis not present

## 2016-01-23 DIAGNOSIS — M179 Osteoarthritis of knee, unspecified: Secondary | ICD-10-CM | POA: Diagnosis not present

## 2016-01-23 DIAGNOSIS — Z87891 Personal history of nicotine dependence: Secondary | ICD-10-CM | POA: Insufficient documentation

## 2016-01-23 DIAGNOSIS — Z9889 Other specified postprocedural states: Secondary | ICD-10-CM | POA: Diagnosis not present

## 2016-01-23 DIAGNOSIS — Z85828 Personal history of other malignant neoplasm of skin: Secondary | ICD-10-CM | POA: Diagnosis not present

## 2016-01-23 DIAGNOSIS — Z6841 Body Mass Index (BMI) 40.0 and over, adult: Secondary | ICD-10-CM | POA: Insufficient documentation

## 2016-01-23 DIAGNOSIS — Z8249 Family history of ischemic heart disease and other diseases of the circulatory system: Secondary | ICD-10-CM | POA: Diagnosis not present

## 2016-01-23 DIAGNOSIS — M23301 Other meniscus derangements, unspecified lateral meniscus, left knee: Secondary | ICD-10-CM | POA: Diagnosis not present

## 2016-01-23 DIAGNOSIS — S83282A Other tear of lateral meniscus, current injury, left knee, initial encounter: Secondary | ICD-10-CM | POA: Diagnosis not present

## 2016-01-23 DIAGNOSIS — W19XXXA Unspecified fall, initial encounter: Secondary | ICD-10-CM | POA: Diagnosis not present

## 2016-01-23 DIAGNOSIS — M2342 Loose body in knee, left knee: Secondary | ICD-10-CM | POA: Insufficient documentation

## 2016-01-23 DIAGNOSIS — M1990 Unspecified osteoarthritis, unspecified site: Secondary | ICD-10-CM | POA: Diagnosis not present

## 2016-01-23 DIAGNOSIS — Z800 Family history of malignant neoplasm of digestive organs: Secondary | ICD-10-CM | POA: Diagnosis not present

## 2016-01-23 DIAGNOSIS — Z791 Long term (current) use of non-steroidal anti-inflammatories (NSAID): Secondary | ICD-10-CM | POA: Insufficient documentation

## 2016-01-23 DIAGNOSIS — E669 Obesity, unspecified: Secondary | ICD-10-CM | POA: Diagnosis not present

## 2016-01-23 DIAGNOSIS — Z8262 Family history of osteoporosis: Secondary | ICD-10-CM | POA: Insufficient documentation

## 2016-01-23 DIAGNOSIS — Z86010 Personal history of colonic polyps: Secondary | ICD-10-CM | POA: Insufficient documentation

## 2016-01-23 DIAGNOSIS — Z89011 Acquired absence of right thumb: Secondary | ICD-10-CM | POA: Insufficient documentation

## 2016-01-23 DIAGNOSIS — Z981 Arthrodesis status: Secondary | ICD-10-CM | POA: Insufficient documentation

## 2016-01-23 DIAGNOSIS — Z801 Family history of malignant neoplasm of trachea, bronchus and lung: Secondary | ICD-10-CM | POA: Insufficient documentation

## 2016-01-23 DIAGNOSIS — J449 Chronic obstructive pulmonary disease, unspecified: Secondary | ICD-10-CM | POA: Insufficient documentation

## 2016-01-23 DIAGNOSIS — Z7982 Long term (current) use of aspirin: Secondary | ICD-10-CM | POA: Insufficient documentation

## 2016-01-23 DIAGNOSIS — Z823 Family history of stroke: Secondary | ICD-10-CM | POA: Insufficient documentation

## 2016-01-23 DIAGNOSIS — E785 Hyperlipidemia, unspecified: Secondary | ICD-10-CM | POA: Diagnosis not present

## 2016-01-23 DIAGNOSIS — Z803 Family history of malignant neoplasm of breast: Secondary | ICD-10-CM | POA: Insufficient documentation

## 2016-01-23 DIAGNOSIS — M1712 Unilateral primary osteoarthritis, left knee: Secondary | ICD-10-CM | POA: Diagnosis not present

## 2016-01-23 DIAGNOSIS — M94262 Chondromalacia, left knee: Secondary | ICD-10-CM | POA: Diagnosis not present

## 2016-01-23 DIAGNOSIS — M2242 Chondromalacia patellae, left knee: Secondary | ICD-10-CM | POA: Diagnosis not present

## 2016-01-23 HISTORY — PX: KNEE ARTHROSCOPY WITH LATERAL MENISECTOMY: SHX6193

## 2016-01-23 SURGERY — ARTHROSCOPY, KNEE, WITH LATERAL MENISCECTOMY
Anesthesia: General | Site: Knee | Laterality: Left | Wound class: Clean

## 2016-01-23 MED ORDER — METOCLOPRAMIDE HCL 5 MG/ML IJ SOLN: 5.0000 mg | Freq: Three times a day (TID) | INTRAMUSCULAR | Status: DC | PRN

## 2016-01-23 MED ORDER — OXYCODONE HCL 5 MG PO TABS
5.0000 mg | ORAL_TABLET | Freq: Once | ORAL | Status: AC | PRN
  Administered 2016-01-23: 5 mg via ORAL

## 2016-01-23 MED ORDER — FENTANYL CITRATE (PF) 100 MCG/2ML IJ SOLN: 25.0000 ug | INTRAMUSCULAR | Status: DC | PRN

## 2016-01-23 MED ORDER — LIDOCAINE HCL (CARDIAC) 20 MG/ML IV SOLN
INTRAVENOUS | Status: DC | PRN
Start: 2016-01-23 — End: 2016-01-24
  Administered 2016-01-23: 100 mg via INTRAVENOUS

## 2016-01-23 MED ORDER — PROPOFOL 10 MG/ML IV BOLUS
INTRAVENOUS | Status: DC | PRN
  Administered 2016-01-23: 200 mg via INTRAVENOUS

## 2016-01-23 MED ORDER — MIDAZOLAM HCL 2 MG/2ML IJ SOLN: INTRAMUSCULAR | Status: DC | PRN

## 2016-01-23 MED ORDER — DEXTROSE 5 % IV SOLN: 2000.0000 mg | Freq: Once | INTRAVENOUS | Status: DC

## 2016-01-23 MED ORDER — KETOROLAC TROMETHAMINE 30 MG/ML IJ SOLN
INTRAMUSCULAR | Status: DC | PRN
  Administered 2016-01-23: 30 mg via INTRAVENOUS

## 2016-01-23 MED ORDER — ONDANSETRON HCL 4 MG/2ML IJ SOLN: 4.0000 mg | Freq: Four times a day (QID) | INTRAMUSCULAR | Status: DC | PRN

## 2016-01-23 MED ORDER — BUPIVACAINE-EPINEPHRINE (PF) 0.5% -1:200000 IJ SOLN
INTRAMUSCULAR | Status: AC
  Filled 2016-01-23: qty 30

## 2016-01-23 MED ORDER — OXYCODONE HCL 5 MG/5ML PO SOLN: 5.0000 mg | Freq: Once | ORAL | Status: AC | PRN

## 2016-01-23 MED ORDER — POTASSIUM CHLORIDE IN NACL 20-0.9 MEQ/L-% IV SOLN
INTRAVENOUS | Status: DC
  Filled 2016-01-23: qty 1000

## 2016-01-23 MED ORDER — CEFAZOLIN SODIUM-DEXTROSE 2-4 GM/100ML-% IV SOLN
INTRAVENOUS | Status: AC
  Filled 2016-01-23: qty 100

## 2016-01-23 MED ORDER — ONDANSETRON HCL 4 MG/2ML IJ SOLN
INTRAMUSCULAR | Status: DC | PRN
  Administered 2016-01-23: 4 mg via INTRAVENOUS

## 2016-01-23 MED ORDER — LACTATED RINGERS IV SOLN
INTRAVENOUS | Status: DC
  Administered 2016-01-23: 10:00:00 via INTRAVENOUS

## 2016-01-23 MED ORDER — FENTANYL CITRATE (PF) 100 MCG/2ML IJ SOLN
INTRAMUSCULAR | Status: DC | PRN
  Administered 2016-01-23 (×2): 50 ug via INTRAVENOUS

## 2016-01-23 MED ORDER — METOCLOPRAMIDE HCL 10 MG PO TABS: 5.0000 mg | ORAL_TABLET | Freq: Three times a day (TID) | ORAL | Status: DC | PRN

## 2016-01-23 MED ORDER — LIDOCAINE HCL 1 % IJ SOLN
INTRAMUSCULAR | Status: DC | PRN
  Administered 2016-01-23: 30 mL

## 2016-01-23 MED ORDER — OXYCODONE HCL 5 MG PO TABS: 5.0000 mg | ORAL_TABLET | ORAL | Status: DC | PRN

## 2016-01-23 MED ORDER — OXYCODONE HCL 5 MG PO TABS
ORAL_TABLET | ORAL | Status: DC
Start: 2016-01-23 — End: 2016-01-23
  Filled 2016-01-23: qty 1

## 2016-01-23 MED ORDER — MIDAZOLAM HCL 2 MG/2ML IJ SOLN
INTRAMUSCULAR | Status: DC | PRN
  Administered 2016-01-23: 2 mg via INTRAVENOUS

## 2016-01-23 MED ORDER — DEXAMETHASONE SODIUM PHOSPHATE 10 MG/ML IJ SOLN
INTRAMUSCULAR | Status: DC | PRN
  Administered 2016-01-23: 8 mg via INTRAVENOUS

## 2016-01-23 MED ORDER — FAMOTIDINE 20 MG PO TABS
ORAL_TABLET | ORAL | Status: AC
  Administered 2016-01-23: 20 mg via ORAL
  Filled 2016-01-23: qty 1

## 2016-01-23 MED ORDER — BUPIVACAINE-EPINEPHRINE (PF) 0.5% -1:200000 IJ SOLN
INTRAMUSCULAR | Status: DC | PRN
  Administered 2016-01-23: 30 mL via PERINEURAL

## 2016-01-23 MED ORDER — CEFAZOLIN SODIUM-DEXTROSE 2-4 GM/100ML-% IV SOLN
2.0000 g | Freq: Once | INTRAVENOUS | Status: AC
Start: 2016-01-23 — End: 2016-01-23
  Administered 2016-01-23: 2 g via INTRAVENOUS

## 2016-01-23 MED ORDER — FAMOTIDINE 20 MG PO TABS
20.0000 mg | ORAL_TABLET | Freq: Once | ORAL | Status: AC
  Administered 2016-01-23: 20 mg via ORAL

## 2016-01-23 MED ORDER — ONDANSETRON HCL 4 MG PO TABS: 4.0000 mg | ORAL_TABLET | Freq: Four times a day (QID) | ORAL | Status: DC | PRN

## 2016-01-23 MED ORDER — LIDOCAINE HCL (PF) 1 % IJ SOLN
INTRAMUSCULAR | Status: AC
  Filled 2016-01-23: qty 30

## 2016-01-23 SURGICAL SUPPLY — 32 items
BAG COUNTER SPONGE EZ (MISCELLANEOUS) IMPLANT
BANDAGE ACE 6X5 VEL STRL LF (GAUZE/BANDAGES/DRESSINGS) ×2 IMPLANT
BLADE FULL RADIUS 3.5 (BLADE) ×2 IMPLANT
BLADE SHAVER 4.5X7 STR FR (MISCELLANEOUS) ×2 IMPLANT
CHLORAPREP W/TINT 26ML (MISCELLANEOUS) ×2 IMPLANT
CUFF TOURN 24 STER (MISCELLANEOUS) IMPLANT
CUFF TOURN 30 STER DUAL PORT (MISCELLANEOUS) IMPLANT
DRAPE IMP U-DRAPE 54X76 (DRAPES) ×2 IMPLANT
ELECT REM PT RETURN 9FT ADLT (ELECTROSURGICAL) ×2
ELECTRODE REM PT RTRN 9FT ADLT (ELECTROSURGICAL) ×1 IMPLANT
GAUZE SPONGE 4X4 12PLY STRL (GAUZE/BANDAGES/DRESSINGS) ×2 IMPLANT
GLOVE BIO SURGEON STRL SZ8 (GLOVE) ×4 IMPLANT
GLOVE BIOGEL M 7.0 STRL (GLOVE) ×4 IMPLANT
GLOVE BIOGEL PI IND STRL 7.5 (GLOVE) ×1 IMPLANT
GLOVE BIOGEL PI INDICATOR 7.5 (GLOVE) ×1
GLOVE INDICATOR 8.0 STRL GRN (GLOVE) ×2 IMPLANT
GOWN STRL REUS W/ TWL LRG LVL3 (GOWN DISPOSABLE) ×1 IMPLANT
GOWN STRL REUS W/ TWL XL LVL3 (GOWN DISPOSABLE) ×2 IMPLANT
GOWN STRL REUS W/TWL LRG LVL3 (GOWN DISPOSABLE) ×1
GOWN STRL REUS W/TWL XL LVL3 (GOWN DISPOSABLE) ×2
IV LACTATED RINGER IRRG 3000ML (IV SOLUTION) ×1
IV LR IRRIG 3000ML ARTHROMATIC (IV SOLUTION) ×1 IMPLANT
KIT RM TURNOVER STRD PROC AR (KITS) ×2 IMPLANT
MANIFOLD NEPTUNE II (INSTRUMENTS) ×2 IMPLANT
NEEDLE HYPO 21X1.5 SAFETY (NEEDLE) ×2 IMPLANT
PACK ARTHROSCOPY KNEE (MISCELLANEOUS) ×2 IMPLANT
PENCIL ELECTRO HAND CTR (MISCELLANEOUS) ×2 IMPLANT
SUT PROLENE 4 0 PS 2 18 (SUTURE) ×2 IMPLANT
SUT TI-CRON 2-0 W/10 SWGD (SUTURE) IMPLANT
SYR 50ML LL SCALE MARK (SYRINGE) ×2 IMPLANT
TUBING ARTHRO INFLOW-ONLY STRL (TUBING) ×2 IMPLANT
WAND HAND CNTRL MULTIVAC 90 (MISCELLANEOUS) ×2 IMPLANT

## 2016-01-23 NOTE — Discharge Instructions (Signed)

## 2016-01-23 NOTE — Anesthesia Procedure Notes (Signed)
Procedure Name: LMA Insertion Date/Time: 01/23/2016 11:15 AM Performed by: Allean Found Pre-anesthesia Checklist: Patient identified, Emergency Drugs available, Suction available, Patient being monitored and Timeout performed Patient Re-evaluated:Patient Re-evaluated prior to inductionOxygen Delivery Method: Circle system utilized Preoxygenation: Pre-oxygenation with 100% oxygen Intubation Type: IV induction LMA: LMA inserted LMA Size: 4.0 Number of attempts: 1 Tube secured with: Tape Dental Injury: Teeth and Oropharynx as per pre-operative assessment

## 2016-01-23 NOTE — Anesthesia Preprocedure Evaluation (Signed)
Anesthesia Evaluation  Patient identified by MRN, date of birth, ID band Patient awake    Reviewed: Allergy & Precautions, H&P , NPO status , Patient's Chart, lab work & pertinent test results  History of Anesthesia Complications (+) history of anesthetic complications  Airway Mallampati: III  TM Distance: <3 FB Neck ROM: limited    Dental  (+) Poor Dentition   Pulmonary neg shortness of breath, asthma , former smoker,    Pulmonary exam normal breath sounds clear to auscultation       Cardiovascular Exercise Tolerance: Good (-) angina(-) Past MI and (-) DOE negative cardio ROS Normal cardiovascular exam Rhythm:regular Rate:Normal     Neuro/Psych PSYCHIATRIC DISORDERS negative neurological ROS     GI/Hepatic negative GI ROS, Neg liver ROS,   Endo/Other  negative endocrine ROS  Renal/GU negative Renal ROS  negative genitourinary   Musculoskeletal  (+) Arthritis ,   Abdominal   Peds  Hematology negative hematology ROS (+)   Anesthesia Other Findings Past Medical History:   Arthritis                                                      Comment:elbow, hands, neck    Atypical childhood psychosis                    2007           Comment:myoview -negative for ischemia with preserved               LV function   Colon polyps                                                 Hypercalcemia                                   2009           Comment:calcium  WNL 10.2 on 04-2015   Hyperlipidemia                                               Obesity                                                      Urinary incontinence                                         Asthma                                                         Comment:allergy induced asthma-only  exacerbated by               smells like perfume, smoke etc   Complication of anesthesia                                     Comment:WITH ACDF IN JUNE 2016-PT [place] X 10 WEEKS DUE TO INTUBATION  Past Surgical History:   ELBOW SURGERY                                   Left 04/2003         Comment:x2  & for repair & then remove all hardware,               due to injury from a fall   HAND SURGERY                                    Right 2006           Comment:injury- then eventually lost remainder of thumb   ABDOMINAL HYSTERECTOMY                                        ANTERIOR CERVICAL DECOMP/DISCECTOMY FUSION      N/A 01/31/2015      Comment:Procedure: ANTERIOR CERVICAL               DECOMPRESSION/DISCECTOMY FUSION CERVICAL FIVE               SIX;  Surgeon: Karie Chimera, MD;  Location: [facility]              [facility]UR[facility]S;  Service: Neurosurgery;  Laterality:              N/A;   COLONOSCOPY                                                   THUMB AMPUTATION                                 2005           Comment:partial   ORIF HUMERUS FRACTURE                           Right 08/31/2015      Comment:Procedure: OPEN REDUCTION INTERNAL FIXATION               (ORIF) PROXIMAL HUMERUS FRACTURE w/ #2               fberwire;  Surgeon: Corky Mull, MD;                Location: [facility] ORS;  Service: Orthopedics;                Laterality: Right;  BMI    Body Mass Index  40.04 kg/m 2      Reproductive/Obstetrics negative OB ROS                             Anesthesia Physical Anesthesia Plan  ASA: III  Anesthesia Plan: General LMA   Post-op Pain Management:    Induction:   Airway Management Planned:   Additional Equipment:   Intra-op Plan:   Post-operative Plan:   Informed Consent: I have reviewed the patients History and Physical, chart, labs and discussed the procedure including the risks, benefits and alternatives for the proposed anesthesia with the patient or authorized representative who has indicated his/her understanding and acceptance.   Dental Advisory Given  Plan Discussed with:  Anesthesiologist, CRNA and Surgeon  Anesthesia Plan Comments:         Anesthesia Quick Evaluation

## 2016-01-23 NOTE — H&P (Signed)
Paper H&P to be scanned into permanent record. H&P reviewed. No changes. 

## 2016-01-23 NOTE — Op Note (Signed)
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  Procedure:   Extensive arthroscopic debridement with abrasion chondroplasty, excision of loose body, and partial lateral meniscectomy, left knee.  Surgeon:   Pascal Lux, M.D.  Asst.:   Alferd Apa, PA-S  Anesthesia:   General LMA.  Findings:   As above. There were extensive grade 3-4 chondromalacial changes involving the femoral trochlea, the lateral femoral condyle, the lateral tibial plateau, and the medial femoral condyle. There were grade 2-3 chondromalacial changes involving the medial tibial plateau. The medial meniscus was intact, other than some mild degenerative changes along the posterior medial portion of the meniscus. The anterior and posterior cruciate ligaments both were in excellent condition.  Complications:   None.  EBL:   2 cc.  Total fluids:   500 cc of crystalloid.  Tourniquet time:   None  Drains:   None  Closure:   4-0 Prolene interrupted sutures.  Brief clinical note:   The patient is a 70 year old female with a several month history of progressively worsening left knee pain. Her symptoms have persisted despite medications, activity modification, and a steroid injection. Her history and examination were consistent with degenerative joint disease with a probable lateral meniscus tear which was confirmed by MRI scan.. The patient presents at this time for arthroscopy, debridement, and partial lateral meniscectomy.  Procedure:   The patient was brought into the operating room and lain in the supine position. After adequate general laryngeal mask anesthesia was obtained, a timeout was performed to verify the appropriate side. The patient's left knee was injected sterilely using a solution of 30 cc  of 1% lidocaine and 30 cc of 0.5% Sensorcaine with epinephrine. The left lower extremity was prepped with ChloraPrep solution before being draped sterilely. Preoperative antibiotics were administered. The expected portal sites were injected with 0.5% Sensorcaine with epinephrine before the camera was placed in the anterolateral portal and instrumentation performed through the anteromedial portal. The knee was sequentially examined beginning in the suprapatellar pouch, then progressing to the patellofemoral space, the medial gutter compartment, the notch, and finally the lateral compartment and gutter. The findings were as described above. Abundant reactive synovial tissues anteriorly were debrided using the full-radius resector in order to improve visualization. The medial meniscus was probed with the findings as described above. Laterally, there was a complex degenerative tear involving the postero-lateral aspect of the knee. This area was debrided back to stable margins using combination of the straight baskets, the up-biting baskets, and the left and right angled baskets, as well as the full-radius resector. Subsequent probing of the remaining rim demonstrated excellent stability. Multiple areas of grade 3-4 chondromalacial changes were debrided back to stable margins in all 3 compartments using the full-radius resector. In addition, an approximately 1.1 x 0.5 cm loose body was identified and removed using graspers. The instruments were removed from the joint after suctioning the excess fluid. The portal sites were closed using 4-0 Prolene interrupted sutures before a sterile bulky dressing was applied to the knee. The patient was then awakened, extubated, and returned to the recovery room in satisfactory condition after tolerating the procedure well.

## 2016-01-24 ENCOUNTER — Encounter: Payer: Self-pay | Admitting: Surgery

## 2016-01-24 NOTE — Transfer of Care (Signed)
Immediate Anesthesia Transfer of Care Note  Patient: Laura Hale  Procedure(s) Performed: Procedure(s): KNEE ARTHROSCOPY WITH LATERAL MENISECTOMY (Left)  Patient Location: PACU  Anesthesia Type:General  Level of Consciousness: sedated  Airway & Oxygen Therapy: Patient Spontanous Breathing and Patient connected to face mask oxygen  Post-op Assessment: Report given to RN and Post -op Vital signs reviewed and stable  Post vital signs: Reviewed and stable  Last Vitals:  Filed Vitals:   01/23/16 1309 01/23/16 1359  BP: 169/76 161/73  Pulse: 65 64  Temp: 35.7 C   Resp: 16 16    Last Pain:  Filed Vitals:   01/23/16 1401  PainSc: 3          Complications: No apparent anesthesia complications

## 2016-01-24 NOTE — Anesthesia Postprocedure Evaluation (Signed)
Anesthesia Post Note  Patient: Laura Hale  Procedure(s) Performed: Procedure(s) (LRB): KNEE ARTHROSCOPY WITH LATERAL MENISECTOMY (Left)  Patient location during evaluation: PACU Anesthesia Type: General Level of consciousness: awake and alert Pain management: pain level controlled Vital Signs Assessment: post-procedure vital signs reviewed and stable Respiratory status: spontaneous breathing, nonlabored ventilation, respiratory function stable and patient connected to nasal cannula oxygen Cardiovascular status: blood pressure returned to baseline and stable Postop Assessment: no signs of nausea or vomiting Anesthetic complications: no    Last Vitals:  Filed Vitals:   01/23/16 1309 01/23/16 1359  BP: 169/76 161/73  Pulse: 65 64  Temp: 35.7 C   Resp: 16 16    Last Pain:  Filed Vitals:   01/23/16 1401  PainSc: 3                  Broadus [name] K Sheleen Conchas

## 2016-01-25 ENCOUNTER — Encounter: Payer: PPO | Admitting: Physical Therapy

## 2016-01-30 ENCOUNTER — Encounter: Payer: PPO | Admitting: Physical Therapy

## 2016-02-01 ENCOUNTER — Encounter: Payer: PPO | Admitting: Physical Therapy

## 2016-02-02 NOTE — Addendum Note (Signed)
Addendum  created 02/02/16 1547 by Allean Found, CRNA   Modules edited: Anesthesia Responsible Staff

## 2016-02-06 ENCOUNTER — Encounter: Payer: PPO | Admitting: Physical Therapy

## 2016-02-08 ENCOUNTER — Encounter: Payer: PPO | Admitting: Physical Therapy

## 2016-04-23 DIAGNOSIS — M1990 Unspecified osteoarthritis, unspecified site: Secondary | ICD-10-CM | POA: Diagnosis not present

## 2016-04-23 DIAGNOSIS — G629 Polyneuropathy, unspecified: Secondary | ICD-10-CM | POA: Diagnosis not present

## 2016-04-23 DIAGNOSIS — E559 Vitamin D deficiency, unspecified: Secondary | ICD-10-CM | POA: Diagnosis not present

## 2016-04-23 DIAGNOSIS — K219 Gastro-esophageal reflux disease without esophagitis: Secondary | ICD-10-CM | POA: Diagnosis not present

## 2016-04-23 DIAGNOSIS — I10 Essential (primary) hypertension: Secondary | ICD-10-CM | POA: Diagnosis not present

## 2016-04-23 DIAGNOSIS — R739 Hyperglycemia, unspecified: Secondary | ICD-10-CM | POA: Diagnosis not present

## 2016-04-29 DIAGNOSIS — E559 Vitamin D deficiency, unspecified: Secondary | ICD-10-CM | POA: Diagnosis not present

## 2016-04-29 DIAGNOSIS — I10 Essential (primary) hypertension: Secondary | ICD-10-CM | POA: Diagnosis not present

## 2016-04-29 DIAGNOSIS — J449 Chronic obstructive pulmonary disease, unspecified: Secondary | ICD-10-CM | POA: Diagnosis not present

## 2016-04-29 DIAGNOSIS — E784 Other hyperlipidemia: Secondary | ICD-10-CM | POA: Diagnosis not present

## 2016-04-29 DIAGNOSIS — Z6841 Body Mass Index (BMI) 40.0 and over, adult: Secondary | ICD-10-CM | POA: Diagnosis not present

## 2016-04-29 DIAGNOSIS — G629 Polyneuropathy, unspecified: Secondary | ICD-10-CM | POA: Diagnosis not present

## 2016-04-29 DIAGNOSIS — K219 Gastro-esophageal reflux disease without esophagitis: Secondary | ICD-10-CM | POA: Diagnosis not present

## 2016-04-29 DIAGNOSIS — Z23 Encounter for immunization: Secondary | ICD-10-CM | POA: Diagnosis not present

## 2016-04-29 DIAGNOSIS — R739 Hyperglycemia, unspecified: Secondary | ICD-10-CM | POA: Diagnosis not present

## 2016-05-13 DIAGNOSIS — G5601 Carpal tunnel syndrome, right upper limb: Secondary | ICD-10-CM | POA: Diagnosis not present

## 2016-05-20 ENCOUNTER — Other Ambulatory Visit: Payer: Self-pay | Admitting: Obstetrics and Gynecology

## 2016-05-20 ENCOUNTER — Ambulatory Visit
Admission: RE | Admit: 2016-05-20 | Discharge: 2016-05-20 | Disposition: A | Discharge status: Home / Self Care | Payer: PPO | Source: Ambulatory Visit | Attending: Obstetrics and Gynecology | Admitting: Obstetrics and Gynecology

## 2016-05-20 DIAGNOSIS — Z1231 Encounter for screening mammogram for malignant neoplasm of breast: Secondary | ICD-10-CM | POA: Insufficient documentation

## 2016-05-21 DIAGNOSIS — G5601 Carpal tunnel syndrome, right upper limb: Secondary | ICD-10-CM | POA: Diagnosis not present

## 2016-08-07 DIAGNOSIS — R200 Anesthesia of skin: Secondary | ICD-10-CM | POA: Diagnosis not present

## 2016-08-07 DIAGNOSIS — G5601 Carpal tunnel syndrome, right upper limb: Secondary | ICD-10-CM | POA: Diagnosis not present

## 2016-10-22 DIAGNOSIS — E784 Other hyperlipidemia: Secondary | ICD-10-CM | POA: Diagnosis not present

## 2016-10-22 DIAGNOSIS — I10 Essential (primary) hypertension: Secondary | ICD-10-CM | POA: Diagnosis not present

## 2016-10-22 DIAGNOSIS — G629 Polyneuropathy, unspecified: Secondary | ICD-10-CM | POA: Diagnosis not present

## 2016-10-22 DIAGNOSIS — Z6841 Body Mass Index (BMI) 40.0 and over, adult: Secondary | ICD-10-CM | POA: Diagnosis not present

## 2016-10-22 DIAGNOSIS — K219 Gastro-esophageal reflux disease without esophagitis: Secondary | ICD-10-CM | POA: Diagnosis not present

## 2016-10-22 DIAGNOSIS — R739 Hyperglycemia, unspecified: Secondary | ICD-10-CM | POA: Diagnosis not present

## 2016-10-28 DIAGNOSIS — E784 Other hyperlipidemia: Secondary | ICD-10-CM | POA: Diagnosis not present

## 2016-10-28 DIAGNOSIS — J449 Chronic obstructive pulmonary disease, unspecified: Secondary | ICD-10-CM | POA: Diagnosis not present

## 2016-10-28 DIAGNOSIS — I10 Essential (primary) hypertension: Secondary | ICD-10-CM | POA: Diagnosis not present

## 2016-10-28 DIAGNOSIS — K219 Gastro-esophageal reflux disease without esophagitis: Secondary | ICD-10-CM | POA: Diagnosis not present

## 2016-10-28 DIAGNOSIS — Z23 Encounter for immunization: Secondary | ICD-10-CM | POA: Diagnosis not present

## 2016-10-28 DIAGNOSIS — M25512 Pain in left shoulder: Secondary | ICD-10-CM | POA: Diagnosis not present

## 2016-10-28 DIAGNOSIS — G8929 Other chronic pain: Secondary | ICD-10-CM | POA: Diagnosis not present

## 2016-10-28 DIAGNOSIS — R739 Hyperglycemia, unspecified: Secondary | ICD-10-CM | POA: Diagnosis not present

## 2016-10-28 DIAGNOSIS — Z6841 Body Mass Index (BMI) 40.0 and over, adult: Secondary | ICD-10-CM | POA: Diagnosis not present

## 2016-10-28 DIAGNOSIS — M25511 Pain in right shoulder: Secondary | ICD-10-CM | POA: Diagnosis not present

## 2016-10-28 DIAGNOSIS — G629 Polyneuropathy, unspecified: Secondary | ICD-10-CM | POA: Diagnosis not present

## 2016-10-28 DIAGNOSIS — E559 Vitamin D deficiency, unspecified: Secondary | ICD-10-CM | POA: Diagnosis not present

## 2016-11-25 DIAGNOSIS — H2513 Age-related nuclear cataract, bilateral: Secondary | ICD-10-CM | POA: Diagnosis not present

## 2017-01-23 DIAGNOSIS — H2513 Age-related nuclear cataract, bilateral: Secondary | ICD-10-CM | POA: Diagnosis not present

## 2017-01-27 ENCOUNTER — Encounter: Payer: Self-pay | Admitting: *Deleted

## 2017-02-06 ENCOUNTER — Encounter
Admission: RE | Disposition: A | Discharge status: Home / Self Care | Payer: Self-pay | Source: Ambulatory Visit | Attending: Ophthalmology

## 2017-02-06 ENCOUNTER — Ambulatory Visit: Payer: PPO | Admitting: Certified Registered"

## 2017-02-06 ENCOUNTER — Ambulatory Visit
Admission: RE | Admit: 2017-02-06 | Discharge: 2017-02-06 | Disposition: A | Discharge status: Home / Self Care | Payer: PPO | Source: Ambulatory Visit | Attending: Ophthalmology | Admitting: Ophthalmology

## 2017-02-06 ENCOUNTER — Encounter: Payer: Self-pay | Admitting: *Deleted

## 2017-02-06 DIAGNOSIS — Z7982 Long term (current) use of aspirin: Secondary | ICD-10-CM | POA: Insufficient documentation

## 2017-02-06 DIAGNOSIS — Z79899 Other long term (current) drug therapy: Secondary | ICD-10-CM | POA: Insufficient documentation

## 2017-02-06 DIAGNOSIS — E7800 Pure hypercholesterolemia, unspecified: Secondary | ICD-10-CM | POA: Insufficient documentation

## 2017-02-06 DIAGNOSIS — Z87891 Personal history of nicotine dependence: Secondary | ICD-10-CM | POA: Insufficient documentation

## 2017-02-06 DIAGNOSIS — J45909 Unspecified asthma, uncomplicated: Secondary | ICD-10-CM | POA: Diagnosis not present

## 2017-02-06 DIAGNOSIS — H2512 Age-related nuclear cataract, left eye: Secondary | ICD-10-CM | POA: Diagnosis not present

## 2017-02-06 DIAGNOSIS — H2513 Age-related nuclear cataract, bilateral: Secondary | ICD-10-CM | POA: Diagnosis not present

## 2017-02-06 HISTORY — PX: CATARACT EXTRACTION W/PHACO: SHX586

## 2017-02-06 HISTORY — DX: Edema, unspecified: R60.9

## 2017-02-06 SURGERY — PHACOEMULSIFICATION, CATARACT, WITH IOL INSERTION
Anesthesia: Monitor Anesthesia Care | Site: Eye | Laterality: Left | Wound class: Clean

## 2017-02-06 MED ORDER — BSS IO SOLN
INTRAOCULAR | Status: DC | PRN
  Administered 2017-02-06: 1 via INTRAOCULAR

## 2017-02-06 MED ORDER — SODIUM HYALURONATE 23 MG/ML IO SOLN
INTRAOCULAR | Status: DC | PRN
  Administered 2017-02-06: 0.6 mL via INTRAOCULAR

## 2017-02-06 MED ORDER — SODIUM HYALURONATE 10 MG/ML IO SOLN
INTRAOCULAR | Status: DC | PRN
  Administered 2017-02-06: 0.55 mL via INTRAOCULAR

## 2017-02-06 MED ORDER — MOXIFLOXACIN HCL 0.5 % OP SOLN
OPHTHALMIC | Status: DC | PRN
  Administered 2017-02-06: 0.2 mL via OPHTHALMIC

## 2017-02-06 MED ORDER — MIDAZOLAM HCL 5 MG/5ML IJ SOLN
INTRAMUSCULAR | Status: DC | PRN
  Administered 2017-02-06: 1 mg via INTRAVENOUS

## 2017-02-06 MED ORDER — ARMC OPHTHALMIC DILATING DROPS
1.0000 "application " | OPHTHALMIC | Status: AC
  Administered 2017-02-06 (×3): 1 via OPHTHALMIC
  Filled 2017-02-06: qty 0.4

## 2017-02-06 MED ORDER — POVIDONE-IODINE 5 % OP SOLN
OPHTHALMIC | Status: DC | PRN
  Administered 2017-02-06: 1 via OPHTHALMIC

## 2017-02-06 MED ORDER — MIDAZOLAM HCL 2 MG/2ML IJ SOLN
INTRAMUSCULAR | Status: AC
  Filled 2017-02-06: qty 2

## 2017-02-06 MED ORDER — BSS IO SOLN
INTRAOCULAR | Status: DC | PRN
  Administered 2017-02-06: 4 mL via OPHTHALMIC

## 2017-02-06 MED ORDER — MOXIFLOXACIN HCL 0.5 % OP SOLN
1.0000 [drp] | OPHTHALMIC | Status: DC | PRN
  Filled 2017-02-06: qty 3

## 2017-02-06 MED ORDER — SODIUM CHLORIDE 0.9 % IV SOLN
INTRAVENOUS | Status: DC
  Administered 2017-02-06 (×2): via INTRAVENOUS

## 2017-02-06 SURGICAL SUPPLY — 15 items
DISSECTOR HYDRO NUCLEUS 50X22 (MISCELLANEOUS) ×2 IMPLANT
GLOVE BIO SURGEON STRL SZ8 (GLOVE) ×2 IMPLANT
GLOVE BIOGEL M 6.5 STRL (GLOVE) ×2 IMPLANT
GLOVE SURG LX 7.5 STRW (GLOVE) ×1
GLOVE SURG LX STRL 7.5 STRW (GLOVE) ×1 IMPLANT
GOWN STRL REUS W/ TWL LRG LVL3 (GOWN DISPOSABLE) ×2 IMPLANT
GOWN STRL REUS W/TWL LRG LVL3 (GOWN DISPOSABLE) ×2
LENS IOL TECNIS ITEC 15.0 (Intraocular Lens) ×2 IMPLANT
PACK CATARACT (MISCELLANEOUS) ×2 IMPLANT
PACK CATARACT KING (MISCELLANEOUS) ×2 IMPLANT
PACK EYE AFTER SURG (MISCELLANEOUS) ×2 IMPLANT
SOL BSS BAG (MISCELLANEOUS) ×2
SOLUTION BSS BAG (MISCELLANEOUS) ×1 IMPLANT
WATER STERILE IRR 250ML POUR (IV SOLUTION) ×2 IMPLANT
WIPE NON LINTING 3.25X3.25 (MISCELLANEOUS) ×2 IMPLANT

## 2017-02-06 NOTE — Anesthesia Post-op Follow-up Note (Cosign Needed)
Anesthesia QCDR form completed.        

## 2017-02-06 NOTE — Anesthesia Preprocedure Evaluation (Signed)
Anesthesia Evaluation  Patient identified by MRN, date of birth, ID band Patient awake    Reviewed: Allergy & Precautions, H&P , NPO status , Patient's Chart, lab work & pertinent test results  History of Anesthesia Complications (+) DIFFICULT AIRWAY and history of anesthetic complications  Airway Mallampati: III  TM Distance: <3 FB Neck ROM: limited    Dental  (+) Poor Dentition   Pulmonary neg shortness of breath, asthma , former smoker,    Pulmonary exam normal breath sounds clear to auscultation       Cardiovascular Exercise Tolerance: Good (-) angina(-) Past MI and (-) DOE negative cardio ROS Normal cardiovascular exam Rhythm:regular Rate:Normal     Neuro/Psych PSYCHIATRIC DISORDERS Atypical childhood psychosisnegative neurological ROS     GI/Hepatic negative GI ROS, Neg liver ROS, Colon polyps   Endo/Other  negative endocrine ROS  Renal/GU negative Renal ROS Bladder dysfunction   negative genitourinary   Musculoskeletal  (+) Arthritis , Osteoarthritis,    Abdominal   Peds Atypical childhood psychosis   Hematology negative hematology ROS (+)   Anesthesia Other Findings Past Medical History:   Arthritis                                                      Comment:elbow, hands, neck    Atypical childhood psychosis                    2007           Comment:myoview -negative for ischemia with preserved               LV function   Colon polyps                                                 Hypercalcemia                                   2009           Comment:calcium  WNL 10.2 on 04-2015   Hyperlipidemia                                               Obesity                                                      Urinary incontinence                                         Asthma  Comment:allergy induced asthma-only exacerbated by               smells  like perfume, smoke etc   Complication of anesthesia                                     Comment:WITH ACDF IN JUNE 2016-PT [place] X 10 WEEKS DUE TO INTUBATION  Past Surgical History:   ELBOW SURGERY                                   Left 04/2003         Comment:x2  & for repair & then remove all hardware,               due to injury from a fall   HAND SURGERY                                    Right 2006           Comment:injury- then eventually lost remainder of thumb   ABDOMINAL HYSTERECTOMY                                        ANTERIOR CERVICAL DECOMP/DISCECTOMY FUSION      N/A 01/31/2015      Comment:Procedure: ANTERIOR CERVICAL               DECOMPRESSION/DISCECTOMY FUSION CERVICAL FIVE               SIX;  Surgeon: Karie Chimera, MD;  Location: [facility]              NEURO ORS;  Service: Neurosurgery;  Laterality:              N/A;   COLONOSCOPY                                                   THUMB AMPUTATION                                 2005           Comment:partial   ORIF HUMERUS FRACTURE                           Right 08/31/2015      Comment:Procedure: OPEN REDUCTION INTERNAL FIXATION               (ORIF) PROXIMAL HUMERUS FRACTURE w/ #2               fberwire;  Surgeon: Corky Mull, MD;                Location: [facility] ORS;  Service: Orthopedics;                Laterality: Right;  BMI    Body  Mass Index   40.04 kg/m 2      Reproductive/Obstetrics negative OB ROS                             Anesthesia Physical  Anesthesia Plan  ASA: III  Anesthesia Plan: MAC   Post-op Pain Management:    Induction: Intravenous  PONV Risk Score and Plan:   Airway Management Planned: Nasal Cannula  Additional Equipment:   Intra-op Plan:   Post-operative Plan:   Informed Consent: I have reviewed the patients History and Physical, chart, labs and discussed the procedure including the risks, benefits and alternatives for  the proposed anesthesia with the patient or authorized representative who has indicated his/her understanding and acceptance.   Dental Advisory Given  Plan Discussed with: Anesthesiologist, CRNA and Surgeon  Anesthesia Plan Comments:         Anesthesia Quick Evaluation

## 2017-02-06 NOTE — H&P (Signed)
The History and Physical notes are on paper, have been signed, and are to be scanned.   I have examined the patient and there are no changes to the H&P.   Laura Hale 02/06/2017 8:48 AM

## 2017-02-06 NOTE — Discharge Instructions (Addendum)
Eye Surgery Discharge Instructions  Expect mild scratchy sensation or mild soreness. DO NOT RUB YOUR EYE!  The day of surgery:  Minimal physical activity, but bed rest is not required  No reading, computer work, or close hand work  No bending, lifting, or straining.  May watch TV  For 24 hours:  No driving, legal decisions, or alcoholic beverages  Safety precautions  Eat anything you prefer: It is better to start with liquids, then soup then solid foods.  _____ Eye patch should be worn until postoperative exam tomorrow.  ____ Solar shield eyeglasses should be worn for comfort in the sunlight/patch while sleeping  Resume all regular medications including aspirin or Coumadin if these were discontinued prior to surgery. You may shower, bathe, shave, or wash your hair. Tylenol may be taken for mild discomfort.  Call your doctor if you experience significant pain, nausea, or vomiting, fever > 101 or other signs of infection. [phone] or [phone] Specific instructions:  Follow-up Information    Eulogio Bear, MD Follow up.   Specialty:  Ophthalmology Why:  02-07-17 at 9:15 Contact information: [address] [place] [address] [phone]          Eye Surgery Discharge Instructions  Expect mild scratchy sensation or mild soreness. DO NOT RUB YOUR EYE!  The day of surgery:  Minimal physical activity, but bed rest is not required  No reading, computer work, or close hand work  No bending, lifting, or straining.  May watch TV  For 24 hours:  No driving, legal decisions, or alcoholic beverages  Safety precautions  Eat anything you prefer: It is better to start with liquids, then soup then solid foods.  _____ Eye patch should be worn until postoperative exam tomorrow.  ____ Solar shield eyeglasses should be worn for comfort in the sunlight/patch while sleeping  Resume all regular medications including aspirin or Coumadin if these were  discontinued prior to surgery. You may shower, bathe, shave, or wash your hair. Tylenol may be taken for mild discomfort.  Call your doctor if you experience significant pain, nausea, or vomiting, fever > 101 or other signs of infection. [phone] or [phone] Specific instructions:  Follow-up Information    Eulogio Bear, MD Follow up.   Specialty:  Ophthalmology Why:  02-07-17 at 9:15 Contact information: [address] [name] [address] [phone]

## 2017-02-06 NOTE — Transfer of Care (Signed)
Immediate Anesthesia Transfer of Care Note  Patient: Laura Hale  Procedure(s) Performed: Procedure(s) with comments: CATARACT EXTRACTION PHACO AND INTRAOCULAR LENS PLACEMENT ([place]) (Left) - Lot# [id number] H [place]: 00:37.9 AP%: 7.9 CDE: 2.97  Patient Location: PACU  Anesthesia Type:MAC  Level of Consciousness: awake, alert , oriented and patient cooperative  Airway & Oxygen Therapy: Patient Spontanous Breathing  Post-op Assessment: Report given to RN, Post -op Vital signs reviewed and stable and Patient moving all extremities X 4  Post vital signs: Reviewed and stable  Last Vitals:  Vitals:   02/06/17 0821 02/06/17 0924  BP: 139/74 (!) 156/78  Pulse: 80 66  Resp: 16   Temp: 36.9 C 36.5 C    Last Pain:  Vitals:   02/06/17 0821  TempSrc: Oral         Complications: No apparent anesthesia complications

## 2017-02-06 NOTE — Op Note (Signed)
OPERATIVE NOTE  AKAYA PROFFIT [id number] 02/06/2017   PREOPERATIVE DIAGNOSIS:  Nuclear sclerotic cataract left eye.  H25.12   POSTOPERATIVE DIAGNOSIS:    Nuclear sclerotic cataract left eye.     PROCEDURE:  Phacoemusification with posterior chamber intraocular lens placement of the left eye   LENS:   Implant Name Type Inv. Item Serial No. Manufacturer Lot No. LRB No. Used  LENS IOL DIOP 15.0 - [id number] Intraocular Lens LENS IOL DIOP 15.0 [id number] AMO   Left 1       PCB00 +15.0   ULTRASOUND TIME: 0 minutes 37 seconds.  CDE 2.97   SURGEON:  Benay Pillow, MD, MPH   ANESTHESIA:  Topical with tetracaine drops augmented with 1% preservative-free intracameral lidocaine.  ESTIMATED BLOOD LOSS: <1 mL   COMPLICATIONS:  None.   DESCRIPTION OF PROCEDURE:  The patient was identified in the holding room and transported to the operating room and placed in the supine position under the operating microscope.  The left eye was identified as the operative eye and it was prepped and draped in the usual sterile ophthalmic fashion.   A 1.0 millimeter clear-corneal paracentesis was made at the 5:00 position. 0.5 ml of preservative-free 1% lidocaine with epinephrine was injected into the anterior chamber.  The anterior chamber was filled with Healon 5 viscoelastic.  A 2.4 millimeter keratome was used to make a near-clear corneal incision at the 2:00 position.  A curvilinear capsulorrhexis was made with a cystotome and capsulorrhexis forceps.  Balanced salt solution was used to hydrodissect and hydrodelineate the nucleus.   Phacoemulsification was then used in stop and chop fashion to remove the lens nucleus and epinucleus.  The remaining cortex was then removed using the irrigation and aspiration handpiece. Healon was then placed into the capsular bag to distend it for lens placement.  A lens was then injected into the capsular bag.  The remaining viscoelastic was aspirated.   Wounds were hydrated  with balanced salt solution.  The anterior chamber was inflated to a physiologic pressure with balanced salt solution.  Intracameral vigamox 0.1 mL undiltued was injected into the eye and a drop placed onto the ocular surface.  No wound leaks were noted.  The patient was taken to the recovery room in stable condition without complications of anesthesia or surgery  Benay Pillow 02/06/2017, 9:21 AM

## 2017-02-06 NOTE — Anesthesia Postprocedure Evaluation (Signed)
Anesthesia Post Note  Patient: Laura Hale  Procedure(s) Performed: Procedure(s) (LRB): CATARACT EXTRACTION PHACO AND INTRAOCULAR LENS PLACEMENT ([place]) (Left)  Patient location during evaluation: PACU Anesthesia Type: MAC Level of consciousness: awake and alert Pain management: pain level controlled Vital Signs Assessment: post-procedure vital signs reviewed and stable Respiratory status: spontaneous breathing, nonlabored ventilation and respiratory function stable Cardiovascular status: stable and blood pressure returned to baseline Anesthetic complications: no     Last Vitals:  Vitals:   02/06/17 0821 02/06/17 0924  BP: 139/74 (!) 156/78  Pulse: 80 66  Resp: 16   Temp: 36.9 C 36.5 C    Last Pain:  Vitals:   02/06/17 0821  TempSrc: Shane Crutch A

## 2017-02-21 DIAGNOSIS — H2511 Age-related nuclear cataract, right eye: Secondary | ICD-10-CM | POA: Diagnosis not present

## 2017-02-26 ENCOUNTER — Encounter: Payer: Self-pay | Admitting: *Deleted

## 2017-03-06 ENCOUNTER — Ambulatory Visit: Payer: PPO | Admitting: Anesthesiology

## 2017-03-06 ENCOUNTER — Ambulatory Visit
Admission: RE | Admit: 2017-03-06 | Discharge: 2017-03-06 | Disposition: A | Discharge status: Home / Self Care | Payer: PPO | Source: Ambulatory Visit | Attending: Ophthalmology | Admitting: Ophthalmology

## 2017-03-06 ENCOUNTER — Encounter: Payer: Self-pay | Admitting: *Deleted

## 2017-03-06 ENCOUNTER — Encounter
Admission: RE | Disposition: A | Discharge status: Home / Self Care | Payer: Self-pay | Source: Ambulatory Visit | Attending: Ophthalmology

## 2017-03-06 DIAGNOSIS — M1990 Unspecified osteoarthritis, unspecified site: Secondary | ICD-10-CM | POA: Insufficient documentation

## 2017-03-06 DIAGNOSIS — Z7982 Long term (current) use of aspirin: Secondary | ICD-10-CM | POA: Insufficient documentation

## 2017-03-06 DIAGNOSIS — Z87891 Personal history of nicotine dependence: Secondary | ICD-10-CM | POA: Diagnosis not present

## 2017-03-06 DIAGNOSIS — Z6841 Body Mass Index (BMI) 40.0 and over, adult: Secondary | ICD-10-CM | POA: Insufficient documentation

## 2017-03-06 DIAGNOSIS — Z79899 Other long term (current) drug therapy: Secondary | ICD-10-CM | POA: Diagnosis not present

## 2017-03-06 DIAGNOSIS — J45909 Unspecified asthma, uncomplicated: Secondary | ICD-10-CM | POA: Insufficient documentation

## 2017-03-06 DIAGNOSIS — E7800 Pure hypercholesterolemia, unspecified: Secondary | ICD-10-CM | POA: Diagnosis not present

## 2017-03-06 DIAGNOSIS — H2511 Age-related nuclear cataract, right eye: Secondary | ICD-10-CM | POA: Diagnosis not present

## 2017-03-06 HISTORY — PX: CATARACT EXTRACTION W/PHACO: SHX586

## 2017-03-06 SURGERY — PHACOEMULSIFICATION, CATARACT, WITH IOL INSERTION
Anesthesia: Monitor Anesthesia Care | Site: Eye | Laterality: Right | Wound class: Clean

## 2017-03-06 MED ORDER — SODIUM HYALURONATE 23 MG/ML IO SOLN
INTRAOCULAR | Status: AC
  Filled 2017-03-06: qty 0.6

## 2017-03-06 MED ORDER — POVIDONE-IODINE 5 % OP SOLN
OPHTHALMIC | Status: DC | PRN
  Administered 2017-03-06: 1 via OPHTHALMIC

## 2017-03-06 MED ORDER — SODIUM CHLORIDE 0.9 % IV SOLN
INTRAVENOUS | Status: DC
  Administered 2017-03-06 (×2): via INTRAVENOUS

## 2017-03-06 MED ORDER — EPINEPHRINE PF 1 MG/ML IJ SOLN
INTRAMUSCULAR | Status: AC
  Filled 2017-03-06: qty 1

## 2017-03-06 MED ORDER — FENTANYL CITRATE (PF) 100 MCG/2ML IJ SOLN
INTRAMUSCULAR | Status: DC | PRN
  Administered 2017-03-06 (×2): 25 ug via INTRAVENOUS

## 2017-03-06 MED ORDER — MOXIFLOXACIN HCL 0.5 % OP SOLN
OPHTHALMIC | Status: AC
  Filled 2017-03-06: qty 3

## 2017-03-06 MED ORDER — NA CHONDROIT SULF-NA HYALURON 40-17 MG/ML IO SOLN
INTRAOCULAR | Status: AC
  Filled 2017-03-06: qty 1

## 2017-03-06 MED ORDER — POVIDONE-IODINE 5 % OP SOLN
OPHTHALMIC | Status: AC
  Filled 2017-03-06: qty 30

## 2017-03-06 MED ORDER — ARMC OPHTHALMIC DILATING DROPS
OPHTHALMIC | Status: AC
  Administered 2017-03-06: 11:00:00
  Filled 2017-03-06: qty 0.4

## 2017-03-06 MED ORDER — LIDOCAINE HCL (PF) 4 % IJ SOLN
INTRAMUSCULAR | Status: DC | PRN
  Administered 2017-03-06: 4 mL via OPHTHALMIC

## 2017-03-06 MED ORDER — LIDOCAINE HCL (PF) 4 % IJ SOLN
INTRAMUSCULAR | Status: AC
  Filled 2017-03-06: qty 5

## 2017-03-06 MED ORDER — MOXIFLOXACIN HCL 0.5 % OP SOLN
OPHTHALMIC | Status: DC | PRN
  Administered 2017-03-06: 0.2 mL via OPHTHALMIC

## 2017-03-06 MED ORDER — ARMC OPHTHALMIC DILATING DROPS
1.0000 "application " | OPHTHALMIC | Status: AC
  Administered 2017-03-06 (×2): 1 via OPHTHALMIC

## 2017-03-06 MED ORDER — MIDAZOLAM HCL 2 MG/2ML IJ SOLN
INTRAMUSCULAR | Status: DC | PRN
  Administered 2017-03-06: 1.5 mg via INTRAVENOUS

## 2017-03-06 MED ORDER — BSS IO SOLN
INTRAOCULAR | Status: DC | PRN
  Administered 2017-03-06: 200 mL via INTRAOCULAR

## 2017-03-06 MED ORDER — MOXIFLOXACIN HCL 0.5 % OP SOLN: 1.0000 [drp] | OPHTHALMIC | Status: DC | PRN

## 2017-03-06 MED ORDER — MIDAZOLAM HCL 2 MG/2ML IJ SOLN
INTRAMUSCULAR | Status: AC
  Filled 2017-03-06: qty 2

## 2017-03-06 MED ORDER — FENTANYL CITRATE (PF) 100 MCG/2ML IJ SOLN
INTRAMUSCULAR | Status: AC
  Filled 2017-03-06: qty 2

## 2017-03-06 SURGICAL SUPPLY — 16 items
DISSECTOR HYDRO NUCLEUS 50X22 (MISCELLANEOUS) ×2 IMPLANT
GLOVE BIO SURGEON STRL SZ8 (GLOVE) ×2 IMPLANT
GLOVE BIOGEL M 6.5 STRL (GLOVE) ×2 IMPLANT
GLOVE SURG LX 7.5 STRW (GLOVE) ×1
GLOVE SURG LX STRL 7.5 STRW (GLOVE) ×1 IMPLANT
GOWN STRL REUS W/ TWL LRG LVL3 (GOWN DISPOSABLE) ×2 IMPLANT
GOWN STRL REUS W/TWL LRG LVL3 (GOWN DISPOSABLE) ×2
LABEL CATARACT MEDS ST (LABEL) ×2 IMPLANT
LENS IOL TECNIS ITEC 14.5 (Intraocular Lens) ×2 IMPLANT
PACK CATARACT (MISCELLANEOUS) ×2 IMPLANT
PACK CATARACT KING (MISCELLANEOUS) ×2 IMPLANT
PACK EYE AFTER SURG (MISCELLANEOUS) ×2 IMPLANT
SOL BSS BAG (MISCELLANEOUS) ×2
SOLUTION BSS BAG (MISCELLANEOUS) ×1 IMPLANT
WATER STERILE IRR 250ML POUR (IV SOLUTION) ×2 IMPLANT
WIPE NON LINTING 3.25X3.25 (MISCELLANEOUS) ×2 IMPLANT

## 2017-03-06 NOTE — H&P (Signed)
The History and Physical notes are on paper, have been signed, and are to be scanned.   I have examined the patient and there are no changes to the H&P.   Laura Hale 03/06/2017 10:44 AM

## 2017-03-06 NOTE — Op Note (Signed)
OPERATIVE NOTE  Laura Hale [id number] 03/06/2017   PREOPERATIVE DIAGNOSIS:  Nuclear sclerotic cataract right eye.  H25.11   POSTOPERATIVE DIAGNOSIS:    Nuclear sclerotic cataract right eye.     PROCEDURE:  Phacoemusification with posterior chamber intraocular lens placement of the right eye   LENS:   Implant Name Type Inv. Item Serial No. Manufacturer Lot No. LRB No. Used  LENS IOL DIOP 14.5 - [id number] Intraocular Lens LENS IOL DIOP 14.5 [id number] AMO   Right 1       PCB00 +14.5   ULTRASOUND TIME: 0 minutes 22 seconds.  CDE 1.64   SURGEON:  Benay Pillow, MD, MPH  ANESTHESIOLOGIST: Anesthesiologist: [facility], Jane Canary, MD   ANESTHESIA:  Topical with tetracaine drops augmented with 1% preservative-free intracameral lidocaine.  ESTIMATED BLOOD LOSS: less than 1 mL.   COMPLICATIONS:  None.   DESCRIPTION OF PROCEDURE:  The patient was identified in the holding room and transported to the operating room and placed in the supine position under the operating microscope.  The right eye was identified as the operative eye and it was prepped and draped in the usual sterile ophthalmic fashion.   A 1.0 millimeter clear-corneal paracentesis was made at the 10:30 position. 0.5 ml of preservative-free 1% lidocaine with epinephrine was injected into the anterior chamber.  The anterior chamber was filled with DiscoVisc viscoelastic.  A 2.4 millimeter keratome was used to make a near-clear corneal incision at the 8:00 position.  A curvilinear capsulorrhexis was made with a cystotome and capsulorrhexis forceps.  Balanced salt solution was used to hydrodissect and hydrodelineate the nucleus.   Phacoemulsification was then used in stop and chop fashion to remove the lens nucleus and epinucleus.  The remaining cortex was then removed using the irrigation and aspiration handpiece. DiscoVisc was then placed into the capsular bag to distend it for lens placement.  A lens was then injected  into the capsular bag.  The remaining viscoelastic was aspirated.   Wounds were hydrated with balanced salt solution.  The anterior chamber was inflated to a physiologic pressure with balanced salt solution.   Intracameral vigamox 0.1 mL undiluted was injected into the eye and a drop placed onto the ocular surface.  No wound leaks were noted.  The patient was taken to the recovery room in stable condition without complications of anesthesia or surgery  Benay Pillow 03/06/2017, 11:20 AM

## 2017-03-06 NOTE — Anesthesia Postprocedure Evaluation (Signed)
Anesthesia Post Note  Patient: Laura Hale  Procedure(s) Performed: Procedure(s) (LRB): CATARACT EXTRACTION PHACO AND INTRAOCULAR LENS PLACEMENT ([place]) (Right)  Patient location during evaluation: PACU Anesthesia Type: MAC Level of consciousness: awake Pain management: pain level controlled Vital Signs Assessment: post-procedure vital signs reviewed and stable Respiratory status: spontaneous breathing Cardiovascular status: stable Anesthetic complications: no     Last Vitals:  Vitals:   03/06/17 1124 03/06/17 1127  BP: (!) 143/72 (!) 145/59  Pulse: 64   Resp: 18 18  Temp: 36.6 C     Last Pain:  Vitals:   03/06/17 0923  TempSrc: Oral                 VAN STAVEREN,Niurka Benecke

## 2017-03-06 NOTE — Anesthesia Procedure Notes (Signed)
Procedure Name: MAC Date/Time: 03/06/2017 10:58 AM Performed by: Allean Found Pre-anesthesia Checklist: Patient identified, Emergency Drugs available, Suction available, Patient being monitored and Timeout performed Patient Re-evaluated:Patient Re-evaluated prior to induction Oxygen Delivery Method: Nasal cannula Placement Confirmation: positive ETCO2

## 2017-03-06 NOTE — Discharge Instructions (Signed)
Eye Surgery Discharge Instructions  Expect mild scratchy sensation or mild soreness. DO NOT RUB YOUR EYE!  The day of surgery:  Minimal physical activity, but bed rest is not required  No reading, computer work, or close hand work  No bending, lifting, or straining.  May watch TV  For 24 hours:  No driving, legal decisions, or alcoholic beverages  Safety precautions  Eat anything you prefer: It is better to start with liquids, then soup then solid foods.  _____ Eye patch should be worn until postoperative exam tomorrow.  ____ Solar shield eyeglasses should be worn for comfort in the sunlight/patch while sleeping  Resume all regular medications including aspirin or Coumadin if these were discontinued prior to surgery. You may shower, bathe, shave, or wash your hair. Tylenol may be taken for mild discomfort.  Call your doctor if you experience significant pain, nausea, or vomiting, fever > 101 or other signs of infection. [phone] or [phone] Specific instructions:  Follow-up Information    Eulogio Bear, MD Follow up.   Specialty:  Ophthalmology Why:  July 27 at 9:10am at [facility] information: [address] [name] [address] [phone]

## 2017-03-06 NOTE — Transfer of Care (Signed)
Immediate Anesthesia Transfer of Care Note  Patient: Laura Hale  Procedure(s) Performed: Procedure(s) with comments: CATARACT EXTRACTION PHACO AND INTRAOCULAR LENS PLACEMENT ([place]) (Right) - Lot #[id number] H [place]: 00.22.0 AP%: 7.5 CDE: 1.64   Patient Location: PACU  Anesthesia Type:MAC  Level of Consciousness: awake  Airway & Oxygen Therapy: Patient Spontanous Breathing  Post-op Assessment: Report given to RN  Post vital signs: Reviewed and stable  Last Vitals:  Vitals:   03/06/17 0923  BP: (!) 149/71  Pulse: 71  Resp: 20  Temp: 36.7 C    Last Pain:  Vitals:   03/06/17 0923  TempSrc: Oral         Complications: No apparent anesthesia complications

## 2017-03-06 NOTE — Anesthesia Preprocedure Evaluation (Signed)
Anesthesia Evaluation  Patient identified by MRN, date of birth, ID band  Airway Mallampati: III       Dental  (+) Teeth Intact   Pulmonary asthma , former smoker,     + decreased breath sounds      Cardiovascular Exercise Tolerance: Good  Rhythm:Regular     Neuro/Psych    GI/Hepatic negative GI ROS, Neg liver ROS,   Endo/Other  negative endocrine ROSMorbid obesity  Renal/GU negative Renal ROS     Musculoskeletal negative musculoskeletal ROS (+)   Abdominal (+) + obese,   Peds negative pediatric ROS (+)  Hematology   Anesthesia Other Findings   Reproductive/Obstetrics                             Anesthesia Physical Anesthesia Plan  ASA: III  Anesthesia Plan: MAC   Post-op Pain Management:    Induction: Intravenous  PONV Risk Score and Plan:   Airway Management Planned: Natural Airway and Nasal Cannula  Additional Equipment:   Intra-op Plan:   Post-operative Plan:   Informed Consent: I have reviewed the patients History and Physical, chart, labs and discussed the procedure including the risks, benefits and alternatives for the proposed anesthesia with the patient or authorized representative who has indicated his/her understanding and acceptance.     Plan Discussed with: CRNA  Anesthesia Plan Comments:         Anesthesia Quick Evaluation

## 2017-03-06 NOTE — Anesthesia Post-op Follow-up Note (Cosign Needed)
Anesthesia QCDR form completed.        

## 2017-03-28 DIAGNOSIS — S9781XA Crushing injury of right foot, initial encounter: Secondary | ICD-10-CM | POA: Diagnosis not present

## 2017-03-28 DIAGNOSIS — S99921A Unspecified injury of right foot, initial encounter: Secondary | ICD-10-CM | POA: Diagnosis not present

## 2017-04-07 IMAGING — MG MM SCREENING BREAST TOMO BILATERAL
8 of 12 series · 8 of 28 positions shown · non-contrast
Comparison: Previous exam(s).

ACR Breast Density Category a: The breast tissue is almost entirely
fatty.

CLINICAL DATA: Screening.

EXAM:
DIGITAL SCREENING BILATERAL MAMMOGRAM WITH 3D TOMO WITH CAD

[R MLO synth-2D]
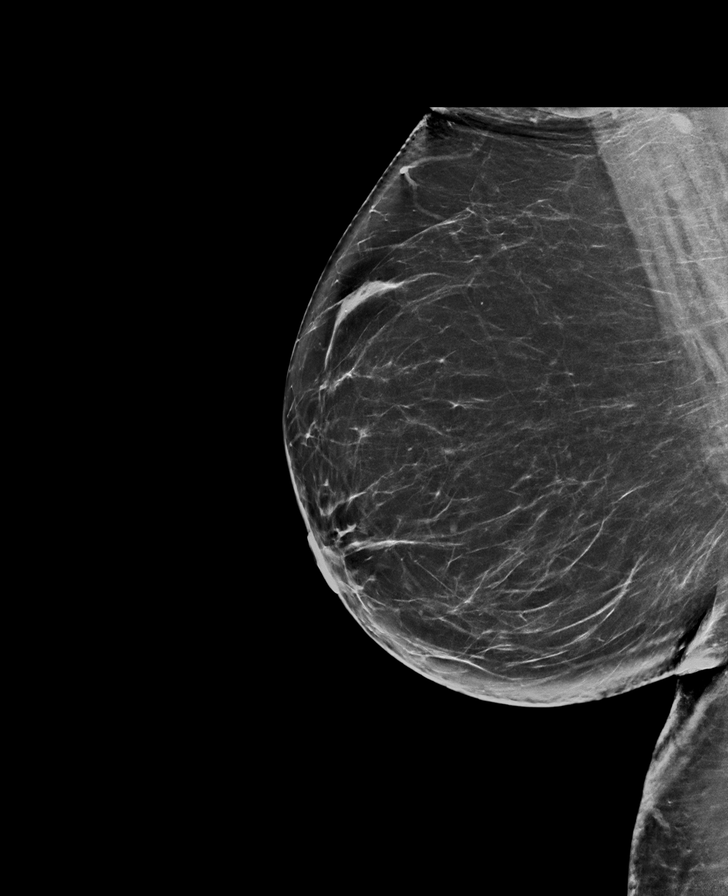

[L MLO synth-2D]
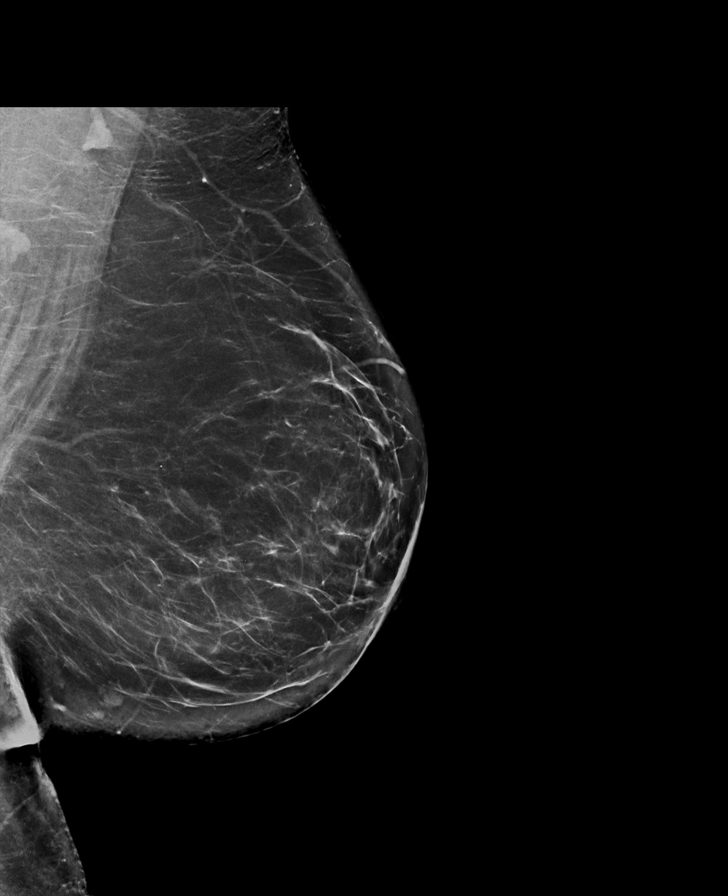

[L CC synth-2D]
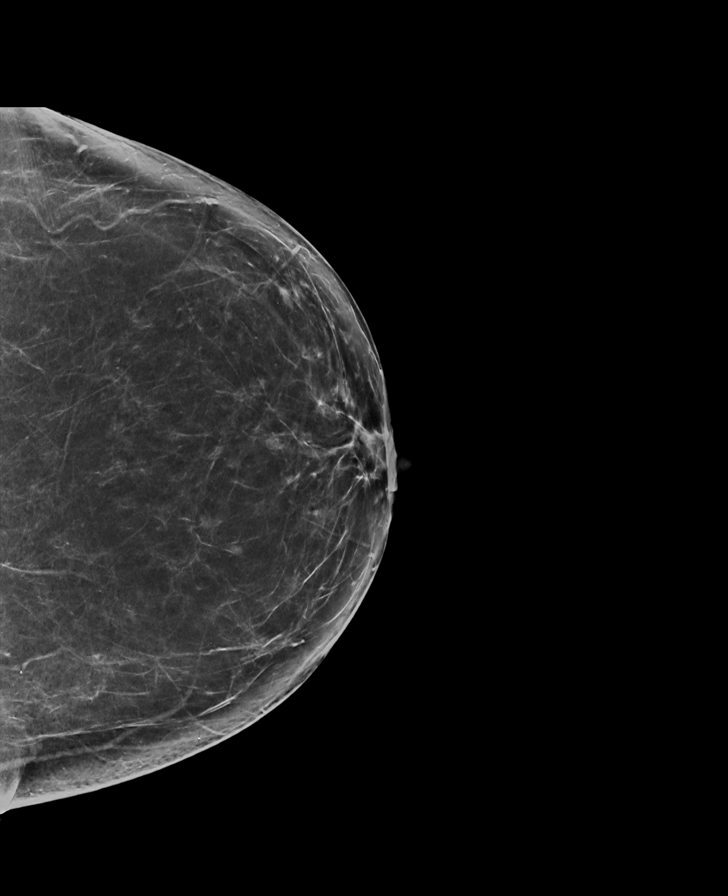

[L CC]
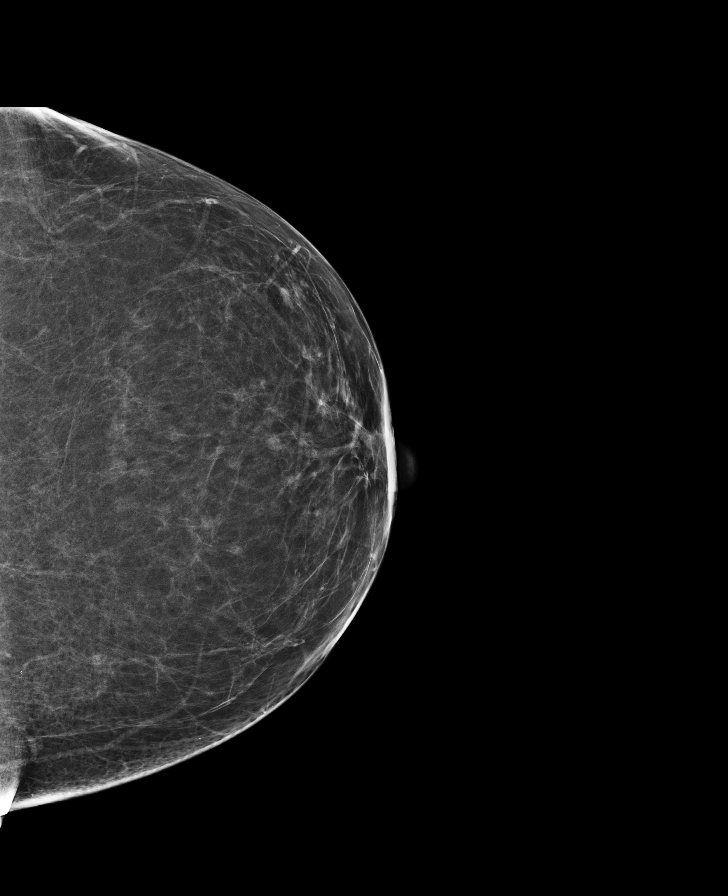

[R MLO]
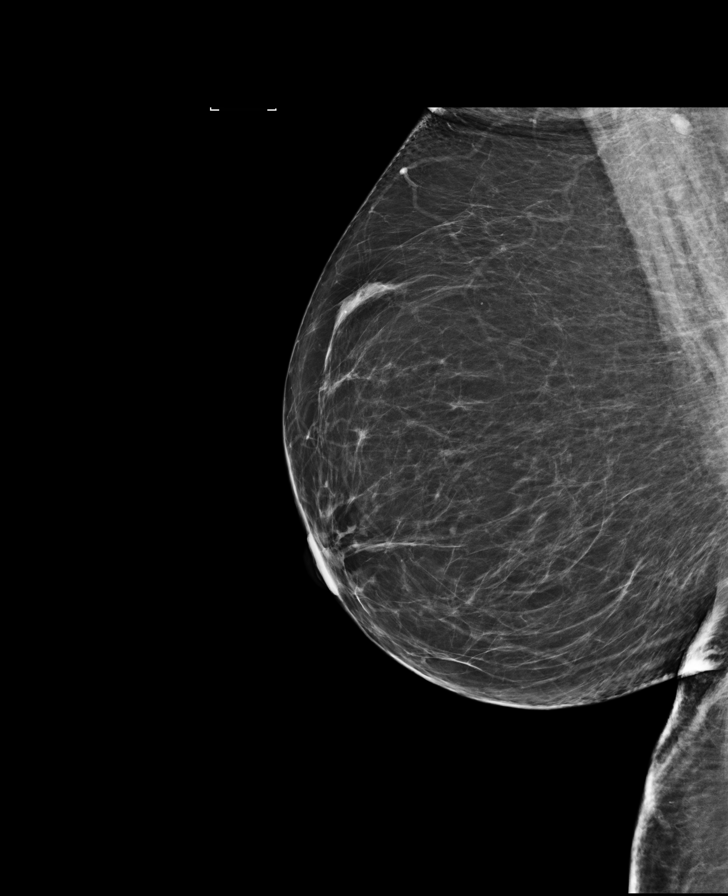

[R CC synth-2D]
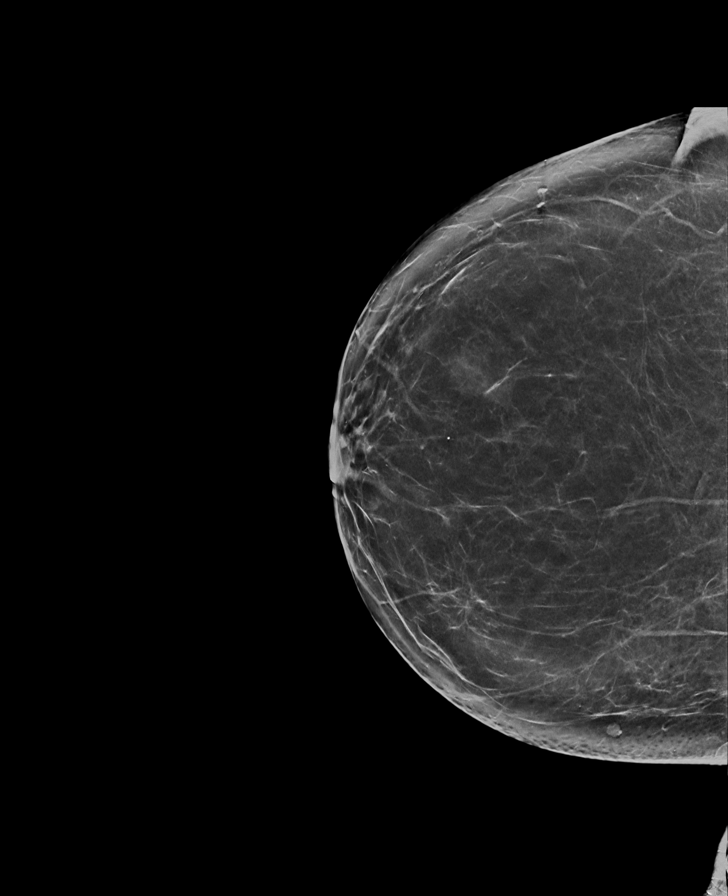

[L MLO]
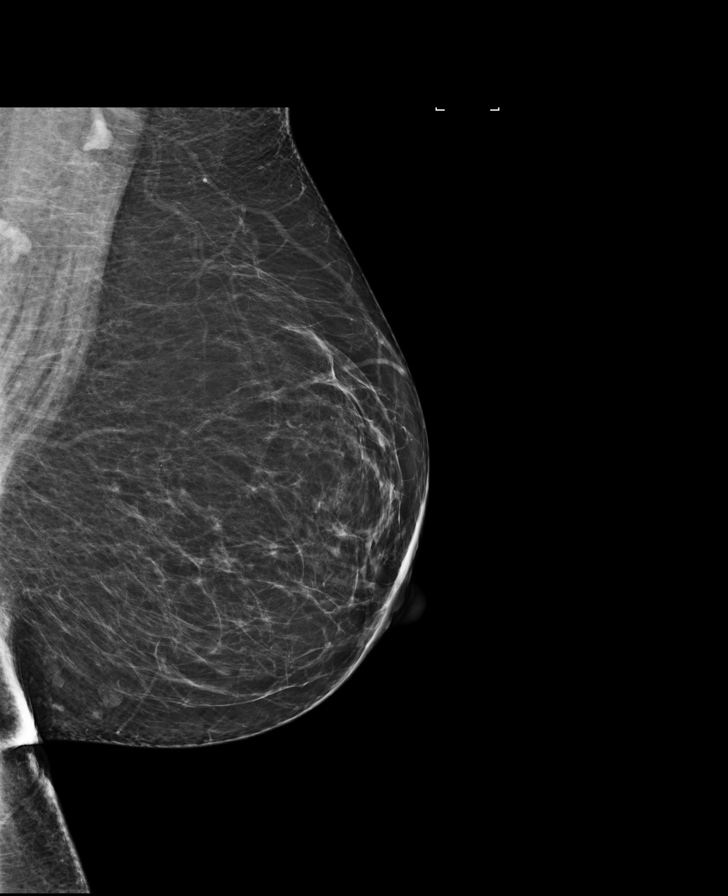

[R CC]
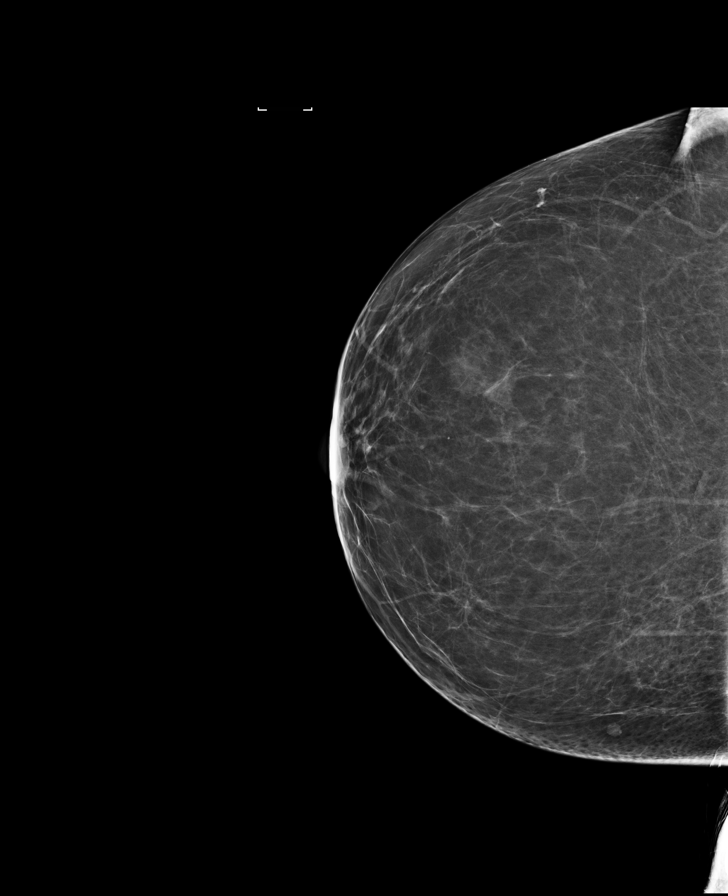

[8 of 28 positions shown; findings below may reference images not displayed]

FINDINGS: There are no findings suspicious for malignancy. Images were
processed with CAD.
IMPRESSION: No mammographic evidence of malignancy. A result letter of this
screening mammogram will be mailed directly to the patient.

RECOMMENDATION:
Screening mammogram in one year. (Code:[id number]-M-GZ7)

BI-RADS CATEGORY  1: Negative.

## 2017-04-15 ENCOUNTER — Other Ambulatory Visit: Payer: Self-pay | Admitting: Obstetrics and Gynecology

## 2017-04-15 ENCOUNTER — Other Ambulatory Visit: Payer: Self-pay | Admitting: Internal Medicine

## 2017-04-15 DIAGNOSIS — Z1231 Encounter for screening mammogram for malignant neoplasm of breast: Secondary | ICD-10-CM

## 2017-04-15 DIAGNOSIS — Z961 Presence of intraocular lens: Secondary | ICD-10-CM | POA: Diagnosis not present

## 2017-04-25 DIAGNOSIS — M1711 Unilateral primary osteoarthritis, right knee: Secondary | ICD-10-CM | POA: Diagnosis not present

## 2017-04-25 DIAGNOSIS — M25561 Pain in right knee: Secondary | ICD-10-CM | POA: Diagnosis not present

## 2017-05-09 DIAGNOSIS — Z23 Encounter for immunization: Secondary | ICD-10-CM | POA: Diagnosis not present

## 2017-05-14 DIAGNOSIS — M150 Primary generalized (osteo)arthritis: Secondary | ICD-10-CM | POA: Diagnosis not present

## 2017-05-14 DIAGNOSIS — Z6841 Body Mass Index (BMI) 40.0 and over, adult: Secondary | ICD-10-CM | POA: Diagnosis not present

## 2017-05-14 DIAGNOSIS — M2550 Pain in unspecified joint: Secondary | ICD-10-CM | POA: Diagnosis not present

## 2017-05-14 DIAGNOSIS — R5383 Other fatigue: Secondary | ICD-10-CM | POA: Diagnosis not present

## 2017-05-14 DIAGNOSIS — R202 Paresthesia of skin: Secondary | ICD-10-CM | POA: Diagnosis not present

## 2017-05-21 ENCOUNTER — Ambulatory Visit
Admission: RE | Admit: 2017-05-21 | Discharge: 2017-05-21 | Disposition: A | Discharge status: Home / Self Care | Payer: PPO | Source: Ambulatory Visit | Attending: Internal Medicine | Admitting: Internal Medicine

## 2017-05-21 DIAGNOSIS — Z1231 Encounter for screening mammogram for malignant neoplasm of breast: Secondary | ICD-10-CM | POA: Diagnosis not present

## 2017-05-21 DIAGNOSIS — K219 Gastro-esophageal reflux disease without esophagitis: Secondary | ICD-10-CM | POA: Diagnosis not present

## 2017-05-21 DIAGNOSIS — E559 Vitamin D deficiency, unspecified: Secondary | ICD-10-CM | POA: Diagnosis not present

## 2017-05-21 DIAGNOSIS — G629 Polyneuropathy, unspecified: Secondary | ICD-10-CM | POA: Diagnosis not present

## 2017-05-21 DIAGNOSIS — I10 Essential (primary) hypertension: Secondary | ICD-10-CM | POA: Diagnosis not present

## 2017-05-21 DIAGNOSIS — J449 Chronic obstructive pulmonary disease, unspecified: Secondary | ICD-10-CM | POA: Diagnosis not present

## 2017-05-21 DIAGNOSIS — R739 Hyperglycemia, unspecified: Secondary | ICD-10-CM | POA: Diagnosis not present

## 2017-05-28 DIAGNOSIS — I10 Essential (primary) hypertension: Secondary | ICD-10-CM | POA: Diagnosis not present

## 2017-05-28 DIAGNOSIS — R739 Hyperglycemia, unspecified: Secondary | ICD-10-CM | POA: Diagnosis not present

## 2017-05-28 DIAGNOSIS — E559 Vitamin D deficiency, unspecified: Secondary | ICD-10-CM | POA: Diagnosis not present

## 2017-05-28 DIAGNOSIS — J449 Chronic obstructive pulmonary disease, unspecified: Secondary | ICD-10-CM | POA: Diagnosis not present

## 2017-05-28 DIAGNOSIS — G629 Polyneuropathy, unspecified: Secondary | ICD-10-CM | POA: Diagnosis not present

## 2017-05-28 DIAGNOSIS — G4733 Obstructive sleep apnea (adult) (pediatric): Secondary | ICD-10-CM | POA: Diagnosis not present

## 2017-05-28 DIAGNOSIS — K219 Gastro-esophageal reflux disease without esophagitis: Secondary | ICD-10-CM | POA: Diagnosis not present

## 2017-05-28 DIAGNOSIS — E7849 Other hyperlipidemia: Secondary | ICD-10-CM | POA: Diagnosis not present

## 2017-05-28 DIAGNOSIS — Z0000 Encounter for general adult medical examination without abnormal findings: Secondary | ICD-10-CM | POA: Diagnosis not present

## 2017-05-28 DIAGNOSIS — Z6841 Body Mass Index (BMI) 40.0 and over, adult: Secondary | ICD-10-CM | POA: Diagnosis not present

## 2017-05-28 DIAGNOSIS — M5020 Other cervical disc displacement, unspecified cervical region: Secondary | ICD-10-CM | POA: Diagnosis not present

## 2017-05-28 DIAGNOSIS — Z1211 Encounter for screening for malignant neoplasm of colon: Secondary | ICD-10-CM | POA: Diagnosis not present

## 2017-06-17 DIAGNOSIS — G4733 Obstructive sleep apnea (adult) (pediatric): Secondary | ICD-10-CM | POA: Diagnosis not present

## 2017-06-18 DIAGNOSIS — G4733 Obstructive sleep apnea (adult) (pediatric): Secondary | ICD-10-CM | POA: Diagnosis not present

## 2017-06-19 DIAGNOSIS — G4733 Obstructive sleep apnea (adult) (pediatric): Secondary | ICD-10-CM | POA: Insufficient documentation

## 2017-07-01 DIAGNOSIS — L720 Epidermal cyst: Secondary | ICD-10-CM | POA: Diagnosis not present

## 2017-07-01 DIAGNOSIS — Z85828 Personal history of other malignant neoplasm of skin: Secondary | ICD-10-CM | POA: Diagnosis not present

## 2017-07-09 DIAGNOSIS — M542 Cervicalgia: Secondary | ICD-10-CM | POA: Diagnosis not present

## 2017-07-09 DIAGNOSIS — G5601 Carpal tunnel syndrome, right upper limb: Secondary | ICD-10-CM | POA: Diagnosis not present

## 2017-07-09 DIAGNOSIS — M79641 Pain in right hand: Secondary | ICD-10-CM | POA: Diagnosis not present

## 2017-07-17 DIAGNOSIS — M7662 Achilles tendinitis, left leg: Secondary | ICD-10-CM | POA: Diagnosis not present

## 2017-07-17 DIAGNOSIS — M79672 Pain in left foot: Secondary | ICD-10-CM | POA: Diagnosis not present

## 2017-08-21 DIAGNOSIS — E7849 Other hyperlipidemia: Secondary | ICD-10-CM | POA: Diagnosis not present

## 2017-08-21 DIAGNOSIS — J449 Chronic obstructive pulmonary disease, unspecified: Secondary | ICD-10-CM | POA: Diagnosis not present

## 2017-08-21 DIAGNOSIS — Z800 Family history of malignant neoplasm of digestive organs: Secondary | ICD-10-CM | POA: Diagnosis not present

## 2017-08-21 DIAGNOSIS — Z86010 Personal history of colonic polyps: Secondary | ICD-10-CM | POA: Diagnosis not present

## 2017-08-21 DIAGNOSIS — G4733 Obstructive sleep apnea (adult) (pediatric): Secondary | ICD-10-CM | POA: Diagnosis not present

## 2017-08-21 DIAGNOSIS — K5909 Other constipation: Secondary | ICD-10-CM | POA: Diagnosis not present

## 2017-08-21 DIAGNOSIS — R739 Hyperglycemia, unspecified: Secondary | ICD-10-CM | POA: Diagnosis not present

## 2017-08-23 DIAGNOSIS — J0100 Acute maxillary sinusitis, unspecified: Secondary | ICD-10-CM | POA: Diagnosis not present

## 2017-08-25 DIAGNOSIS — L7211 Pilar cyst: Secondary | ICD-10-CM | POA: Diagnosis not present

## 2017-08-25 DIAGNOSIS — L720 Epidermal cyst: Secondary | ICD-10-CM | POA: Diagnosis not present

## 2017-08-28 DIAGNOSIS — Z6841 Body Mass Index (BMI) 40.0 and over, adult: Secondary | ICD-10-CM | POA: Diagnosis not present

## 2017-08-28 DIAGNOSIS — E7849 Other hyperlipidemia: Secondary | ICD-10-CM | POA: Diagnosis not present

## 2017-08-28 DIAGNOSIS — R739 Hyperglycemia, unspecified: Secondary | ICD-10-CM | POA: Diagnosis not present

## 2017-08-28 DIAGNOSIS — G4733 Obstructive sleep apnea (adult) (pediatric): Secondary | ICD-10-CM | POA: Diagnosis not present

## 2017-08-28 DIAGNOSIS — J449 Chronic obstructive pulmonary disease, unspecified: Secondary | ICD-10-CM | POA: Diagnosis not present

## 2017-08-28 DIAGNOSIS — M7662 Achilles tendinitis, left leg: Secondary | ICD-10-CM | POA: Diagnosis not present

## 2017-08-28 DIAGNOSIS — N3946 Mixed incontinence: Secondary | ICD-10-CM | POA: Diagnosis not present

## 2017-08-28 DIAGNOSIS — I10 Essential (primary) hypertension: Secondary | ICD-10-CM | POA: Diagnosis not present

## 2017-08-28 DIAGNOSIS — K219 Gastro-esophageal reflux disease without esophagitis: Secondary | ICD-10-CM | POA: Diagnosis not present

## 2017-08-28 DIAGNOSIS — E559 Vitamin D deficiency, unspecified: Secondary | ICD-10-CM | POA: Diagnosis not present

## 2017-08-28 DIAGNOSIS — G629 Polyneuropathy, unspecified: Secondary | ICD-10-CM | POA: Diagnosis not present

## 2017-09-02 DIAGNOSIS — L720 Epidermal cyst: Secondary | ICD-10-CM | POA: Diagnosis not present

## 2017-09-08 DIAGNOSIS — L7211 Pilar cyst: Secondary | ICD-10-CM | POA: Diagnosis not present

## 2017-09-08 DIAGNOSIS — L720 Epidermal cyst: Secondary | ICD-10-CM | POA: Diagnosis not present

## 2017-09-15 DIAGNOSIS — L720 Epidermal cyst: Secondary | ICD-10-CM | POA: Diagnosis not present

## 2017-09-16 ENCOUNTER — Encounter: Payer: Self-pay | Admitting: Physical Therapy

## 2017-09-16 ENCOUNTER — Ambulatory Visit: Payer: PPO | Attending: Podiatry | Admitting: Physical Therapy

## 2017-09-16 ENCOUNTER — Other Ambulatory Visit: Payer: Self-pay

## 2017-09-16 DIAGNOSIS — M6281 Muscle weakness (generalized): Secondary | ICD-10-CM

## 2017-09-16 DIAGNOSIS — R262 Difficulty in walking, not elsewhere classified: Secondary | ICD-10-CM

## 2017-09-16 DIAGNOSIS — M25572 Pain in left ankle and joints of left foot: Secondary | ICD-10-CM | POA: Diagnosis not present

## 2017-09-16 DIAGNOSIS — R2689 Other abnormalities of gait and mobility: Secondary | ICD-10-CM

## 2017-09-16 NOTE — Therapy (Signed)
[facility] [address], [name], [address] Phone: [phone]   Fax:  [phone]  Physical Therapy Evaluation  Patient Details  Name: Laura Hale MRN: [id number] Date of Birth: March 17, 1946 Referring Provider: Samara Deist DPM   Encounter Date: 09/16/2017  PT End of Session - 09/16/17 0851    Visit Number  1    Number of Visits  12    Date for PT Re-Evaluation  10/28/17    PT Start Time  0745    PT Stop Time  0845    PT Time Calculation (min)  60 min    Activity Tolerance  Patient tolerated treatment well;Patient limited by pain    Behavior During Therapy  [facility] for tasks assessed/performed       Past Medical History:  Diagnosis Date  . Arthritis    elbow, hands, neck   . Asthma    allergy induced asthma-only exacerbated by smells like perfume, smoke etc  . Atypical childhood psychosis 2007   myoview -negative for ischemia with preserved LV function  . Colon polyps   . Complication of anesthesia    WITH ACDF IN JUNE 2016-PT STATES SHE LOST HER VOICE X 10 WEEKS DUE TO INTUBATION  . Edema    LEGS/FEET  . Hypercalcemia 2009   calcium  WNL 10.2 on 04-2015  . Hyperlipidemia   . Obesity   . Urinary incontinence     Past Surgical History:  Procedure Laterality Date  . ABDOMINAL HYSTERECTOMY    . ANTERIOR CERVICAL DECOMP/DISCECTOMY FUSION N/A 01/31/2015   Procedure: ANTERIOR CERVICAL DECOMPRESSION/DISCECTOMY FUSION CERVICAL FIVE SIX;  Surgeon: Karie Chimera, MD;  Location: [facility];  Service: Neurosurgery;  Laterality: N/A;  . BACK SURGERY    . CATARACT EXTRACTION W/PHACO Left 02/06/2017   Procedure: CATARACT EXTRACTION PHACO AND INTRAOCULAR LENS PLACEMENT (IOC);  Surgeon: Eulogio Bear, MD;  Location: [facility];  Service: Ophthalmology;  Laterality: Left;  Lot# [id number] H [place]: 00:37.9 AP%: 7.9 CDE: 2.97  . CATARACT EXTRACTION W/PHACO Right 03/06/2017   Procedure: CATARACT EXTRACTION PHACO AND  INTRAOCULAR LENS PLACEMENT (IOC);  Surgeon: Eulogio Bear, MD;  Location: [facility] ORS;  Service: Ophthalmology;  Laterality: Right;  Lot #[id number] H [place]: 00.22.0 AP%: 7.5 CDE: 1.64   . COLONOSCOPY    . ELBOW SURGERY Left 04/2003   x2  & for repair & then remove all hardware, due to injury from a fall  . EYE SURGERY    . FRACTURE SURGERY    . HAND SURGERY Right 2006   injury- then eventually lost remainder of thumb  . KNEE ARTHROSCOPY WITH LATERAL MENISECTOMY Left 01/23/2016   Procedure: KNEE ARTHROSCOPY WITH LATERAL MENISECTOMY;  Surgeon: Corky Mull, MD;  Location: [facility] ORS;  Service: Orthopedics;  Laterality: Left;  . ORIF HUMERUS FRACTURE Right 08/31/2015   Procedure: OPEN REDUCTION INTERNAL FIXATION (ORIF) PROXIMAL HUMERUS FRACTURE w/ #2 fberwire;  Surgeon: Corky Mull, MD;  Location: [facility] ORS;  Service: Orthopedics;  Laterality: Right;  . 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   Subjective Assessment - 09/16/17 0834    Subjective  Patient reports pain and swelling left ankle/foot that is worse with walking and she cannot wear shoes that have backs because of irritation on area. She reports she has a spur and tendinitis in tendon. She has been on prednisone without lasting reliief noted.    Pertinent History  History of pain left heel/ankle x 1 year that has worsened and she is not unable to stand or walk without severe difficulty. She is limited in walking, standing activities which interferes with her daily like at home and in community.     Limitations  Standing;Walking;House hold activities;Other (comment) recreational activities, stairs    How long can you stand comfortably?  <30 min.    How long can you walk comfortably?  <60 min.    Diagnostic tests  X ray: calcaneal spur left ankle, cacification in Achilles tendon    Patient Stated Goals  Patient would like to walk, stand and return to normal activties without pain or swelling left  ankle.    Currently in Pain?  Yes    Pain Score  6  worst pain 8/10; best 3/10    Pain Location  Ankle    Pain Orientation  Left    Pain Descriptors / Indicators  Aching;Throbbing    Pain Type  Chronic pain    Pain Onset  More than a month ago    Pain Frequency  Constant    Aggravating Factors   standing, walking, going down stairs    Pain Relieving Factors  resting    Effect of Pain on Daily Activities  all activity is affected         [facility] PT Assessment - 09/16/17 0755      Assessment   Medical Diagnosis  Achilles tendinitis left LE    Referring Provider  Samara Deist DPM    Onset Date/Surgical Date  08/12/16    Hand Dominance  Right    Prior Therapy  none for left ankle      Precautions   Precautions  None      Restrictions   Weight Bearing Restrictions  No      Balance Screen   Has the patient fallen in the past 6 months  No      [place] residence    Living Arrangements  Spouse/significant other    Type of [place] to enter    Entrance Stairs-Number of Steps  [place]  One level    [place] - standard;Cane - single point;Tub bench;Grab bars - tub/shower      Prior Function   Level of Independence  Independent    Vocation  Retired    Corporate investment banker for preschool, cooking, reading, traveling      Charity fundraiser Status  Within Functional Limits for tasks assessed      Observation/Other Assessments   Other Surveys   Other Surveys Foot/ankle disability index (FADI) 56% impaired     Lower Extremity Functional Scale   27/80 (67% self perceived impairment)      Posture/Postural Control   Posture Comments  swollen posterlor aspect left ankle, bilateral pronated feet,      ROM / Strength   AROM / PROM / Strength  AROM;Strength      AROM   Overall AROM Comments  left ankle DF: 10 PF: 30: inversion: 25 eversion: 10  Right  ankle DF: 10; PF: 45; inversion: 20; eversion 15 All other joints and ROM WNL both LE's       Strength   Overall Strength Comments  bilateral LE's grossly major muscles groups WNL  Palpation   Palpation comment  left ankle posterior aspect moderate swelling, tender, increased warmth to touch      Ambulation/Gait   Gait Pattern  Decreased arm swing - right;Decreased arm swing - left;Decreased weight shift to right;Left foot flat;Antalgic;Wide base of support             Objective measurements completed on examination: See above findings.      Treatment:  Modalities:  [place] 3MHz 50% pulsed 1.2 w/cm2 x 10 min. to posterior aspect left ankle/ Achilles tendon with patient prone with left LE supported on pillow goal: pain, swelling Ice massage x 2 min. Left ankle with patient prone lying; goal: pain, inflammation  High volt estim (2) electrodes applied to left ankle, medial/lateral aspect with patient supine with left LE supported on pillow, intensity to tolerance: goal: pain   Instructed in ice massage, elevation at home to control pain, swelling; no adverse reactions noted  Patient response to treatment: reported decreased tenderness and pain following treatment of ice, [place] and able to walk with less difficulty at end of session with improved weight shift and push off left LE       PT Education - 09/16/17 0853    Education provided  Yes    Education Details  Physical therapy assessment, POC interventions discussed, HEP for ice massage, elevation of left to control swelling /pain    Person(s) Educated  Patient    Methods  Explanation;Demonstration;Handout    Comprehension  Verbalized understanding          PT Long Term Goals - 09/16/17 0951      PT LONG TERM GOAL #1   Title  Patient will demonstrate improved function with daily tasks as indicated by LEFS score of 40/80     Baseline  LEFS 27/80    Status  New    Target Date  10/07/17      PT LONG TERM GOAL #2    Title  improved FADI score to 40% or better indcating improved function with standing, going down steps, walking, standing activties    Baseline  FADI 56%     Status  New    Target Date  10/14/17      PT LONG TERM GOAL #3   Title  improved FADI score to 30% or less demonstrating improved function with daily tasks involving standing, walking    Baseline  FADI 56%    Status  New    Target Date  10/28/17      PT LONG TERM GOAL #4   Title  patient will be indepenent with self management of pain, home program for ROM, strength by discharge to continue progressing towards prior level of function.     Baseline  limited knowledge of appropriate pain control strategies, exercises and progression without assistance, guidance and cuing    Status  New    Target Date  10/28/17             Plan - 09/16/17 0855    Clinical Impression Statement  Patient is a 72 year old female who presents with chronic left ankle pain and swelling that limits her function with daily tasks at home and in community. Her FADI and LEFS indicate moderate to severe self perceived impariment for functional activities. She has limited ROM and pain in left ankle which limts ambulation without pain/difficulty which is conributing to her bilateral knee pain as well. She will benefit from physical therapy intervention to address pain, limited motbility in  order to assist with return to prior level of function.     History and Personal Factors relevant to plan of care:  History of pain left heel/ankle x 1 year that has worsened and she is not unable to stand or walk without severe difficulty. co morbidities: obesity, previous left knee surgery 2017, arthritis. She is limited in walking, standing activities which interferes with her daily like at home and in community.     Clinical Presentation  Evolving    Clinical Presentation due to:  worsening symptoms, increased warmth left ankle    Clinical Decision Making  Moderate    Rehab  Potential  Fair    Clinical Impairments Affecting Rehab Potential  (+)motivated, prior level of function (-)chronic condition, X ray report calcaneal spur with cacifications in tendon    PT Frequency  2x / week    PT Duration  6 weeks    PT Treatment/Interventions  Neuromuscular re-education;Manual techniques;Ultrasound;Electrical Stimulation;Iontophoresis 4mg /ml Dexamethasone;Moist Heat;Therapeutic exercise;Therapeutic activities;Patient/family education    PT Next Visit Plan  pain control, iointo, [place], estim., therapeutic exercise, manual techniques    Consulted and Agree with Plan of Care  Patient       Patient will benefit from skilled therapeutic intervention in order to improve the following deficits and impairments:  Abnormal gait, Pain, Decreased activity tolerance, Decreased range of motion, Decreased strength, Difficulty walking, Obesity  Visit Diagnosis: Pain in left ankle and joints of left foot - Plan: PT plan of care cert/re-cert  Muscle weakness (generalized) - Plan: PT plan of care cert/re-cert  Other abnormalities of gait and mobility - Plan: PT plan of care cert/re-cert  Difficulty in walking, not elsewhere classified - Plan: PT plan of care cert/re-cert     Problem List Patient Active Problem List   Diagnosis Date Noted  . 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    Jomarie Longs PT 09/17/2017, 10:06 AM  [facility] [address], [name], [address] Phone: [phone]   Fax:  [phone]  Name: KATESSA ATTRIDGE MRN: [id number] Date of Birth: 1945/08/25

## 2017-09-18 ENCOUNTER — Ambulatory Visit: Payer: PPO | Admitting: Physical Therapy

## 2017-09-18 ENCOUNTER — Encounter: Payer: Self-pay | Admitting: Physical Therapy

## 2017-09-18 DIAGNOSIS — M25572 Pain in left ankle and joints of left foot: Secondary | ICD-10-CM | POA: Diagnosis not present

## 2017-09-18 DIAGNOSIS — R2689 Other abnormalities of gait and mobility: Secondary | ICD-10-CM

## 2017-09-18 DIAGNOSIS — M6281 Muscle weakness (generalized): Secondary | ICD-10-CM

## 2017-09-18 NOTE — Therapy (Signed)
[facility] [address], [name], [address] Phone: [phone]   Fax:  [phone]  Physical Therapy Treatment  Patient Details  Name: MARRIANNE SICA MRN: [id number] Date of Birth: 1946-01-22 Referring Provider: Samara Deist DPM   Encounter Date: 09/18/2017  PT End of Session - 09/18/17 0856    Visit Number  2    Number of Visits  12    Date for PT Re-Evaluation  10/28/17    PT Start Time  0828    PT Stop Time  0907    PT Time Calculation (min)  39 min    Activity Tolerance  Patient tolerated treatment well;Patient limited by pain    Behavior During Therapy  [facility] for tasks assessed/performed       Past Medical History:  Diagnosis Date  . Arthritis    elbow, hands, neck   . Asthma    allergy induced asthma-only exacerbated by smells like perfume, smoke etc  . Atypical childhood psychosis 2007   myoview -negative for ischemia with preserved LV function  . Colon polyps   . Complication of anesthesia    WITH ACDF IN JUNE 2016-PT STATES SHE LOST HER VOICE X 10 WEEKS DUE TO INTUBATION  . Edema    LEGS/FEET  . Hypercalcemia 2009   calcium  WNL 10.2 on 04-2015  . Hyperlipidemia   . Obesity   . Urinary incontinence     Past Surgical History:  Procedure Laterality Date  . ABDOMINAL HYSTERECTOMY    . ANTERIOR CERVICAL DECOMP/DISCECTOMY FUSION N/A 01/31/2015   Procedure: ANTERIOR CERVICAL DECOMPRESSION/DISCECTOMY FUSION CERVICAL FIVE SIX;  Surgeon: Karie Chimera, MD;  Location: [facility];  Service: Neurosurgery;  Laterality: N/A;  . BACK SURGERY    . CATARACT EXTRACTION W/PHACO Left 02/06/2017   Procedure: CATARACT EXTRACTION PHACO AND INTRAOCULAR LENS PLACEMENT (IOC);  Surgeon: Eulogio Bear, MD;  Location: [facility];  Service: Ophthalmology;  Laterality: Left;  Lot# [id number] H [place]: 00:37.9 AP%: 7.9 CDE: 2.97  . CATARACT EXTRACTION W/PHACO Right 03/06/2017   Procedure: CATARACT EXTRACTION PHACO AND  INTRAOCULAR LENS PLACEMENT (IOC);  Surgeon: Eulogio Bear, MD;  Location: [facility];  Service: Ophthalmology;  Laterality: Right;  Lot #[id number] H [place]: 00.22.0 AP%: 7.5 CDE: 1.64   . COLONOSCOPY    . ELBOW SURGERY Left 04/2003   x2  & for repair & then remove all hardware, due to injury from a fall  . EYE SURGERY    . FRACTURE SURGERY    . HAND SURGERY Right 2006   injury- then eventually lost remainder of thumb  . KNEE ARTHROSCOPY WITH LATERAL MENISECTOMY Left 01/23/2016   Procedure: KNEE ARTHROSCOPY WITH LATERAL MENISECTOMY;  Surgeon: Corky Mull, MD;  Location: [facility];  Service: Orthopedics;  Laterality: Left;  . ORIF HUMERUS FRACTURE Right 08/31/2015   Procedure: OPEN REDUCTION INTERNAL FIXATION (ORIF) PROXIMAL HUMERUS FRACTURE w/ #2 fberwire;  Surgeon: Corky Mull, MD;  Location: [facility];  Service: Orthopedics;  Laterality: Right;  . 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  Subjective Assessment - 09/18/17 0828    Subjective  Patient reports decreased swelling and pain in left ankle/heel following previous session.    Pertinent History  History of pain left heel/ankle x 1 year that has worsened and she is not unable to stand or walk without severe difficulty. She is limited in walking, standing activities which interferes  with her daily like at home and in community.     Limitations  Standing;Walking;House hold activities;Other (comment) recreational activities, stairs    How long can you stand comfortably?  <30 min.    How long can you walk comfortably?  <60 min.    Diagnostic tests  X ray: calcaneal spur left ankle, cacification in Achilles tendon    Patient Stated Goals  Patient would like to walk, stand and return to normal activties without pain or swelling left ankle.    Currently in Pain?  Yes    Pain Score  5     Pain Location  Ankle    Pain Orientation  Left    Pain Descriptors / Indicators  Aching    Pain Type  Chronic pain     Pain Onset  More than a month ago    Pain Frequency  Constant       Objective: Left ankle/heel: moderate swelling with improved soft tissue mobility as compared to previous session  Treatment:  Modalities:  [place] 3MHz 50% pulsed 1.2 w/cm2 x 10 min. to posterior aspect left ankle/ Achilles tendon with patient prone with left LE supported on pillow goal: pain, swelling Ice massage x 2 min. Left ankle with patient prone lying; goal: pain, inflammation; no adverse reactions noted  High volt estim x 18 min. (2) electrodes applied to left ankle, medial/lateral aspect with patient supine with left LE supported on pillow, intensity to tolerance: goal: pain  Patient response to treatment: improved soft tissue elasticity, decreased tenderness posterior ankle/ heel by 30% with pain decreased to 3/10 following treatment session. Improved gait pattern with less pain at end of session    PT Education - 09/18/17 0912    Education provided  Yes    Education Details  re assessed home instructions including ice massange, elevation and gentle ROM to avoid aggravating symptoms    Person(s) Educated  Patient    Methods  Explanation    Comprehension  Verbalized understanding          PT Long Term Goals - 09/16/17 0951      PT LONG TERM GOAL #1   Title  Patient will demonstrate improved function with daily tasks as indicated by LEFS score of 40/80     Baseline  LEFS 27/80    Status  New    Target Date  10/07/17      PT LONG TERM GOAL #2   Title  improved FADI score to 40% or better indcating improved function with standing, going down steps, walking, standing activties    Baseline  FADI 56%     Status  New    Target Date  10/14/17      PT LONG TERM GOAL #3   Title  improved FADI score to 30% or less demonstrating improved function with daily tasks involving standing, walking    Baseline  FADI 56%    Status  New    Target Date  10/28/17      PT LONG TERM GOAL #4   Title  patient will be  indepenent with self management of pain, home program for ROM, strength by discharge to continue progressing towards prior level of function.     Baseline  limited knowledge of appropriate pain control strategies, exercises and progression without assistance, guidance and cuing    Status  New    Target Date  10/28/17            Plan - 09/18/17 2595  Clinical Impression Statement  Patient demonstrated good carry over from previous session with less swelling in left ankle and decreasing pain temporarily. She continues with pain on standing and walking and will require continued physical therapy     Rehab Potential  Fair    Clinical Impairments Affecting Rehab Potential  (+)motivated, prior level of function (-)chronic condition, X ray report calcaneal spur with cacifications in tendon    PT Frequency  2x / week    PT Duration  6 weeks    PT Treatment/Interventions  Neuromuscular re-education;Manual techniques;Ultrasound;Electrical Stimulation;Iontophoresis 4mg /ml Dexamethasone;Moist Heat;Therapeutic exercise;Therapeutic activities;Patient/family education    PT Next Visit Plan  pain control, iointo, [place], estim., therapeutic exercise, manual techniques    PT Home Exercise Plan  ice massage and ROM , weight shifting as able       Patient will benefit from skilled therapeutic intervention in order to improve the following deficits and impairments:  Abnormal gait, Pain, Decreased activity tolerance, Decreased range of motion, Decreased strength, Difficulty walking, Obesity  Visit Diagnosis: Pain in left ankle and joints of left foot  Muscle weakness (generalized)  Other abnormalities of gait and mobility     Problem List Patient Active Problem List   Diagnosis Date Noted  . 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    Jomarie Longs PT 09/19/2017, 10:15 AM  [facility] [address] [name], [address] Phone:  [phone]   Fax:  [phone]  Name: NIANI MOURER MRN: [id number] Date of Birth: 1945-11-10

## 2017-09-22 ENCOUNTER — Encounter: Payer: Self-pay | Admitting: Physical Therapy

## 2017-09-22 ENCOUNTER — Ambulatory Visit: Payer: PPO | Admitting: Physical Therapy

## 2017-09-22 ENCOUNTER — Encounter: Payer: Self-pay | Admitting: *Deleted

## 2017-09-22 DIAGNOSIS — R262 Difficulty in walking, not elsewhere classified: Secondary | ICD-10-CM

## 2017-09-22 DIAGNOSIS — M25572 Pain in left ankle and joints of left foot: Secondary | ICD-10-CM

## 2017-09-22 DIAGNOSIS — R2689 Other abnormalities of gait and mobility: Secondary | ICD-10-CM

## 2017-09-22 DIAGNOSIS — M6281 Muscle weakness (generalized): Secondary | ICD-10-CM

## 2017-09-22 NOTE — Therapy (Signed)
[facility] [address], [name], [address] Phone: [phone]   Fax:  [phone]  Physical Therapy Treatment  Patient Details  Name: Laura Hale MRN: [id number] Date of Birth: 1946/03/20 Referring Provider: Samara Deist DPM   Encounter Date: 09/22/2017  PT End of Session - 09/22/17 0948    Visit Number  3    Number of Visits  12    Date for PT Re-Evaluation  10/28/17    PT Start Time  0942    PT Stop Time  1041    PT Time Calculation (min)  59 min    Activity Tolerance  Patient tolerated treatment well;Patient limited by pain    Behavior During Therapy  [facility] for tasks assessed/performed       Past Medical History:  Diagnosis Date  . Arthritis    elbow, hands, neck   . Asthma    allergy induced asthma-only exacerbated by smells like perfume, smoke etc  . Atypical childhood psychosis 2007   myoview -negative for ischemia with preserved LV function  . Colon polyps   . Complication of anesthesia    WITH ACDF IN JUNE 2016-PT STATES SHE LOST HER VOICE X 10 WEEKS DUE TO INTUBATION  . Edema    LEGS/FEET  . Hypercalcemia 2009   calcium  WNL 10.2 on 04-2015  . Hyperlipidemia   . Obesity   . Urinary incontinence     Past Surgical History:  Procedure Laterality Date  . ABDOMINAL HYSTERECTOMY    . ANTERIOR CERVICAL DECOMP/DISCECTOMY FUSION N/A 01/31/2015   Procedure: ANTERIOR CERVICAL DECOMPRESSION/DISCECTOMY FUSION CERVICAL FIVE SIX;  Surgeon: Karie Chimera, MD;  Location: [facility];  Service: Neurosurgery;  Laterality: N/A;  . BACK SURGERY    . CATARACT EXTRACTION W/PHACO Left 02/06/2017   Procedure: CATARACT EXTRACTION PHACO AND INTRAOCULAR LENS PLACEMENT (IOC);  Surgeon: Eulogio Bear, MD;  Location: [facility];  Service: Ophthalmology;  Laterality: Left;  Lot# [id number] H [place]: 00:37.9 AP%: 7.9 CDE: 2.97  . CATARACT EXTRACTION W/PHACO Right 03/06/2017   Procedure: CATARACT EXTRACTION PHACO AND  INTRAOCULAR LENS PLACEMENT (IOC);  Surgeon: Eulogio Bear, MD;  Location: [facility];  Service: Ophthalmology;  Laterality: Right;  Lot #[id number] H [place]: 00.22.0 AP%: 7.5 CDE: 1.64   . COLONOSCOPY    . ELBOW SURGERY Left 04/2003   x2  & for repair & then remove all hardware, due to injury from a fall  . EYE SURGERY    . FRACTURE SURGERY    . HAND SURGERY Right 2006   injury- then eventually lost remainder of thumb  . KNEE ARTHROSCOPY WITH LATERAL MENISECTOMY Left 01/23/2016   Procedure: KNEE ARTHROSCOPY WITH LATERAL MENISECTOMY;  Surgeon: Corky Mull, MD;  Location: [facility];  Service: Orthopedics;  Laterality: Left;  . ORIF HUMERUS FRACTURE Right 08/31/2015   Procedure: OPEN REDUCTION INTERNAL FIXATION (ORIF) PROXIMAL HUMERUS FRACTURE w/ #2 fberwire;  Surgeon: Corky Mull, MD;  Location: [facility];  Service: Orthopedics;  Laterality: Right;  . 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  Subjective Assessment - 09/22/17 0946    Subjective  Patient reports her left ankle pain, swelling is better with ice massage and is about the same pain with standing /walking and still unable to tolerate pressure on back of heel due to swelling and pain .    Pertinent History  History of pain left heel/ankle x 1  year that has worsened and she is not unable to stand or walk without severe difficulty. She is limited in walking, standing activities which interferes with her daily like at home and in community.     Limitations  Standing;Walking;House hold activities;Other (comment) recreational activities, stairs    How long can you stand comfortably?  <30 min.    How long can you walk comfortably?  <60 min.    Diagnostic tests  X ray: calcaneal spur left ankle, cacification in Achilles tendon    Patient Stated Goals  Patient would like to walk, stand and return to normal activties without pain or swelling left ankle.    Currently in Pain?  Yes    Pain Score  5     Pain  Location  Ankle heel    Pain Orientation  Left    Pain Descriptors / Indicators  Aching;Tender;Shooting;Sore    Pain Type  Chronic pain    Pain Onset  More than a month ago    Pain Frequency  Constant         Objective: Left ankle/heel: moderate swelling with decreased soft tissue mobility; decreased warmth to very mild as compared to previous session  Treatment:  Modalities:  Ultrasound:  1 and 3MHz 50% pulsed 1.2 w/cm2 x66min. to posterior aspect left ankle/ Achilles tendon with patient prone with left LE supported on pillow goal: pain, inflammation, swelling  Iontophoresis with dexamethasone 4mg /ml applied 30ma*min using medium sized electrode to posterior aspect of left foot/heel over swollen area with patient supine lying; goal: pain, inflammation; no adverse reactions noted (application 15 min)  High volt estim x 20 min. (2) electrodes applied to left ankle, medial/lateral aspectwith patient supine with left LE supported on pillow, intensity to tolerance: goal: pain(applied in conjunction with iontophoresis)   Patient response to treatment: Improved soft tissue elasticity at end of session with patient reporting feeling like tissue behind heel was "looser". No adverse reactions noted with any treatment performed. Pain level improved slightly at end of session, no numeric value given.     PT Education - 09/22/17 1048    Education provided  Yes    Education Details  discussed iointophoresis including how it works, possible reactions including redness or allergic reaction; patient verbally consented to treatment.     Person(s) Educated  Patient    Methods  Explanation    Comprehension  Verbalized understanding          PT Long Term Goals - 09/16/17 0951      PT LONG TERM GOAL #1   Title  Patient will demonstrate improved function with daily tasks as indicated by LEFS score of 40/80     Baseline  LEFS 27/80    Status  New    Target Date  10/07/17      PT LONG TERM  GOAL #2   Title  improved FADI score to 40% or better indcating improved function with standing, going down steps, walking, standing activties    Baseline  FADI 56%     Status  New    Target Date  10/14/17      PT LONG TERM GOAL #3   Title  improved FADI score to 30% or less demonstrating improved function with daily tasks involving standing, walking    Baseline  FADI 56%    Status  New    Target Date  10/28/17      PT LONG TERM GOAL #4   Title  patient will be indepenent  with self management of pain, home program for ROM, strength by discharge to continue progressing towards prior level of function.     Baseline  limited knowledge of appropriate pain control strategies, exercises and progression without assistance, guidance and cuing    Status  New    Target Date  10/28/17            Plan - 09/22/17 1046    Clinical Impression Statement  Patient demonstrates steady progress towards goals. no adverse reaction to addition of iontophoresis to treatment today. She reported feeling "looser" at end of session indicating decreased swelling. she will benefit from continued physical therapy intervention to address pain/inflammation in order to improve function with ambulation.     Rehab Potential  Fair    Clinical Impairments Affecting Rehab Potential  (+)motivated, prior level of function (-)chronic condition, X ray report calcaneal spur with cacifications in tendon    PT Frequency  2x / week    PT Duration  6 weeks    PT Treatment/Interventions  Neuromuscular re-education;Manual techniques;Ultrasound;Electrical Stimulation;Iontophoresis 4mg /ml Dexamethasone;Moist Heat;Therapeutic exercise;Therapeutic activities;Patient/family education    PT Next Visit Plan  pain control, iointo, [place], estim., therapeutic exercise, manual techniques    PT Home Exercise Plan  ice massage and ROM , weight shifting as able       Patient will benefit from skilled therapeutic intervention in order to improve  the following deficits and impairments:  Abnormal gait, Pain, Decreased activity tolerance, Decreased range of motion, Decreased strength, Difficulty walking, Obesity  Visit Diagnosis: Pain in left ankle and joints of left foot  Muscle weakness (generalized)  Other abnormalities of gait and mobility  Difficulty in walking, not elsewhere classified     Problem List Patient Active Problem List   Diagnosis Date Noted  . 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    Jomarie Longs PT 09/22/2017, 10:50 AM  [facility] [address], [name], [address] Phone: [phone]   Fax:  [phone]  Name: Laura Hale MRN: [id number] Date of Birth: 1945-11-30

## 2017-09-23 ENCOUNTER — Encounter
Admission: RE | Disposition: A | Discharge status: Home / Self Care | Payer: Self-pay | Source: Ambulatory Visit | Attending: Internal Medicine

## 2017-09-23 ENCOUNTER — Ambulatory Visit: Payer: PPO | Admitting: Anesthesiology

## 2017-09-23 ENCOUNTER — Encounter: Payer: Self-pay | Admitting: Certified Registered Nurse Anesthetist

## 2017-09-23 ENCOUNTER — Ambulatory Visit
Admission: RE | Admit: 2017-09-23 | Discharge: 2017-09-23 | Disposition: A | Discharge status: Home / Self Care | Payer: PPO | Source: Ambulatory Visit | Attending: Internal Medicine | Admitting: Internal Medicine

## 2017-09-23 DIAGNOSIS — Z79899 Other long term (current) drug therapy: Secondary | ICD-10-CM | POA: Insufficient documentation

## 2017-09-23 DIAGNOSIS — Z7982 Long term (current) use of aspirin: Secondary | ICD-10-CM | POA: Diagnosis not present

## 2017-09-23 DIAGNOSIS — K5730 Diverticulosis of large intestine without perforation or abscess without bleeding: Secondary | ICD-10-CM | POA: Insufficient documentation

## 2017-09-23 DIAGNOSIS — K640 First degree hemorrhoids: Secondary | ICD-10-CM | POA: Diagnosis not present

## 2017-09-23 DIAGNOSIS — J449 Chronic obstructive pulmonary disease, unspecified: Secondary | ICD-10-CM | POA: Diagnosis not present

## 2017-09-23 DIAGNOSIS — K635 Polyp of colon: Secondary | ICD-10-CM | POA: Diagnosis not present

## 2017-09-23 DIAGNOSIS — Z86010 Personal history of colonic polyps: Secondary | ICD-10-CM | POA: Diagnosis not present

## 2017-09-23 DIAGNOSIS — Z800 Family history of malignant neoplasm of digestive organs: Secondary | ICD-10-CM | POA: Insufficient documentation

## 2017-09-23 DIAGNOSIS — Z6841 Body Mass Index (BMI) 40.0 and over, adult: Secondary | ICD-10-CM | POA: Diagnosis not present

## 2017-09-23 DIAGNOSIS — K5790 Diverticulosis of intestine, part unspecified, without perforation or abscess without bleeding: Secondary | ICD-10-CM | POA: Diagnosis not present

## 2017-09-23 DIAGNOSIS — K648 Other hemorrhoids: Secondary | ICD-10-CM | POA: Diagnosis not present

## 2017-09-23 DIAGNOSIS — Z1211 Encounter for screening for malignant neoplasm of colon: Secondary | ICD-10-CM | POA: Diagnosis not present

## 2017-09-23 DIAGNOSIS — E785 Hyperlipidemia, unspecified: Secondary | ICD-10-CM | POA: Diagnosis not present

## 2017-09-23 HISTORY — DX: Chronic obstructive pulmonary disease, unspecified: J44.9

## 2017-09-23 HISTORY — PX: COLONOSCOPY WITH PROPOFOL: SHX5780

## 2017-09-23 SURGERY — COLONOSCOPY WITH PROPOFOL
Anesthesia: General

## 2017-09-23 MED ORDER — SODIUM CHLORIDE 0.9 % IV SOLN
INTRAVENOUS | Status: DC
  Administered 2017-09-23 (×2): 1000 mL via INTRAVENOUS

## 2017-09-23 MED ORDER — PROPOFOL 500 MG/50ML IV EMUL
INTRAVENOUS | Status: DC | PRN
  Administered 2017-09-23: 150 ug/kg/min via INTRAVENOUS

## 2017-09-23 MED ORDER — PROPOFOL 10 MG/ML IV BOLUS
INTRAVENOUS | Status: DC | PRN
  Administered 2017-09-23: 60 mg via INTRAVENOUS

## 2017-09-23 MED ORDER — PROPOFOL 500 MG/50ML IV EMUL
INTRAVENOUS | Status: AC
  Filled 2017-09-23: qty 50

## 2017-09-23 NOTE — Anesthesia Post-op Follow-up Note (Signed)
Anesthesia QCDR form completed.        

## 2017-09-23 NOTE — H&P (Addendum)
Outpatient short stay form Pre-procedure 09/23/2017 3:02 PM Akansha Wyche K. Alice Reichert, M.D.  Primary Physician: Ramonita Lab, M.D.  Reason for visit:  Colon polyp surveillance, family history of colon cancer (Mother)  History of present illness:  Patient presents for colonoscopy for screening/surveillance. The patient denies complaints of abdominal pain, significant change in bowel habits, or rectal bleeding.     Current Facility-Administered Medications:  .  0.9 %  sodium chloride infusion, , Intravenous, Continuous, [place], Benay Pike, MD, Last Rate: 20 mL/hr at 09/23/17 1440, 1,000 mL at 09/23/17 1440  Medications Prior to Admission  Medication Sig Dispense Refill Last Dose  . acetaminophen (TYLENOL) 500 MG tablet Take 500 mg by mouth every 6 (six) hours as needed for moderate pain. Reported on 10/12/2015   Past Week at Unknown time  . aspirin 81 MG tablet Take 81 mg by mouth daily.   Past Week at Unknown time  . atorvastatin (LIPITOR) 80 MG tablet Take 40 mg by mouth every morning.    Past Week at Unknown time  . azelastine (ASTELIN) 0.1 % nasal spray Place 1 spray into both nostrils daily as needed for allergies. Reported on 01/23/2016   Past Week at Unknown time  . cetirizine (ZYRTEC) 10 MG tablet Take 10 mg by mouth daily.   Past Week at Unknown time  . Cholecalciferol (VITAMIN D3) 1000 UNITS CAPS Take 1,000 Units by mouth daily.   Past Week at Unknown time  . hydrochlorothiazide (HYDRODIURIL) 12.5 MG tablet Take 12.5 mg by mouth daily.   Past Week at Unknown time  . ibuprofen (ADVIL,MOTRIN) 200 MG tablet Take 200 mg by mouth every 6 (six) hours as needed for moderate pain.   Past Week at Unknown time  . Multiple Vitamin (MULTIVITAMIN) tablet Take 1 tablet by mouth daily.   Past Week at Unknown time  . naproxen (NAPROSYN) 500 MG tablet Take 500 mg by mouth 2 (two) times daily with a meal.   Past Week at Unknown time  . oxyCODONE (ROXICODONE) 5 MG immediate release tablet Take 1-2 tablets (5-10 mg  total) by mouth every 4 (four) hours as needed for moderate pain. (Patient not taking: Reported on 01/27/2017) 50 tablet 0 Completed Course at Unknown time     No Known Allergies   Past Medical History:  Diagnosis Date  . Arthritis    elbow, hands, neck   . Asthma    allergy induced asthma-only exacerbated by smells like perfume, smoke etc  . Atypical childhood psychosis 2007   myoview -negative for ischemia with preserved LV function  . Colon polyps   . Complication of anesthesia    WITH ACDF IN JUNE 2016-PT STATES SHE LOST HER VOICE X 10 WEEKS DUE TO INTUBATION  . COPD (chronic obstructive pulmonary disease) ([place])   . Edema    LEGS/FEET  . Hypercalcemia 2009   calcium  WNL 10.2 on 04-2015  . Hyperlipidemia   . Obesity   . Urinary incontinence     Review of systems:      Physical Exam  General appearance: alert, cooperative and appears stated age Resp: clear to auscultation bilaterally Cardio: regular rate and rhythm, S1, S2 normal, no murmur, click, rub or gallop GI: soft, non-tender; bowel sounds normal; no masses,  no organomegaly Extremities: extremities normal, atraumatic, no cyanosis or edema     Planned procedures: Colonoscopy. The patient understands the nature of the planned procedure, indications, risks, alternatives and potential complications including but not limited to bleeding, infection, perforation, damage  to internal organs and possible oversedation/side effects from anesthesia. The patient agrees and gives consent to proceed.  Please refer to procedure notes for findings, recommendations and patient disposition/instructions.    Gottlieb Zuercher K. Alice Reichert, M.D. Gastroenterology 09/23/2017  3:02 PM

## 2017-09-23 NOTE — Anesthesia Procedure Notes (Signed)
Date/Time: 09/23/2017 3:22 PM Performed by: Nelda Marseille, CRNA Pre-anesthesia Checklist: Patient identified, Emergency Drugs available, Suction available, Patient being monitored and Timeout performed Oxygen Delivery Method: Nasal cannula

## 2017-09-23 NOTE — Interval H&P Note (Signed)
History and Physical Interval Note:  09/23/2017 3:04 PM  Laura Hale  has presented today for surgery, with the diagnosis of PERS HX COLON POLYPS  The various methods of treatment have been discussed with the patient and family. After consideration of risks, benefits and other options for treatment, the patient has consented to  Procedure(s): COLONOSCOPY WITH PROPOFOL (N/A) as a surgical intervention .  The patient's history has been reviewed, patient examined, no change in status, stable for surgery.  I have reviewed the patient's chart and labs.  Questions were answered to the patient's satisfaction.     [place], [place]

## 2017-09-23 NOTE — Op Note (Signed)
[facility] Gastroenterology Patient Name: Laura Hale Procedure Date: 09/23/2017 3:09 PM MRN: [id number] Account #: [id number] Date of Birth: [birth year] Admit Type: Outpatient Age: 72 Room: [facility] ENDO ROOM 1 Gender: Female Note Status: Finalized Procedure:            Colonoscopy Indications:          Screening in patient at increased risk: Family history                        of 1st-degree relative with colorectal cancer, High                        risk colon cancer surveillance: Personal history of                        colonic polyps Providers:            Benay Pike. Alice Reichert MD, MD Referring MD:         Ramonita Lab, MD (Referring MD) Medicines:            Propofol per Anesthesia Complications:        No immediate complications. Procedure:            Pre-Anesthesia Assessment:                       - The risks and benefits of the procedure and the                        sedation options and risks were discussed with the                        patient. All questions were answered and informed                        consent was obtained.                       - Patient identification and proposed procedure were                        verified prior to the procedure by the nurse. The                        procedure was verified in the procedure room.                       - ASA Grade Assessment: III - A patient with severe                        systemic disease.                       - After reviewing the risks and benefits, the patient                        was deemed in satisfactory condition to undergo the                        procedure.  After obtaining informed consent, the colonoscope was                        passed under direct vision. Throughout the procedure,                        the patient's blood pressure, pulse, and oxygen                        saturations were monitored continuously. The   Colonoscope was introduced through the anus and                        advanced to the the cecum, identified by appendiceal                        orifice and ileocecal valve. The colonoscopy was                        somewhat difficult due to significant looping.                        Successful completion of the procedure was aided by                        using manual pressure. The patient tolerated the                        procedure well. The quality of the bowel preparation                        was good. The ileocecal valve, appendiceal orifice, and                        rectum were photographed. Findings:      The perianal and digital rectal examinations were normal. Pertinent       negatives include normal sphincter tone and no palpable rectal lesions.      Many small-mouthed diverticula were found in the colon. There was no       evidence of diverticular bleeding.      A 6 mm polyp was found in the ascending colon. The polyp was sessile.       The polyp was removed with a hot snare. Resection and retrieval were       complete.      Non-bleeding internal hemorrhoids were found during retroflexion. The       hemorrhoids were Grade I (internal hemorrhoids that do not prolapse).      The exam was otherwise without abnormality. Impression:           - Mild diverticulosis. There was no evidence of                        diverticular bleeding.                       - One 6 mm polyp in the ascending colon, removed with a                        hot snare. Resected and retrieved.                       -  Non-bleeding internal hemorrhoids.                       - The examination was otherwise normal. Recommendation:       - Patient has a contact number available for                        emergencies. The signs and symptoms of potential                        delayed complications were discussed with the patient.                        Return to normal activities tomorrow. Written  discharge                        instructions were provided to the patient.                       - Resume previous diet.                       - Continue present medications.                       - Await pathology results.                       - Repeat colonoscopy is recommended for surveillance.                        The colonoscopy date will be determined after pathology                        results from today's exam become available for review.                       - Return to GI office PRN.                       - The findings and recommendations were discussed with                        the patient and their family. Procedure Code(s):    --- Professional ---                       [phone], Colonoscopy, flexible; with removal of tumor(s),                        polyp(s), or other lesion(s) by snare technique Diagnosis Code(s):    --- Professional ---                       Z80.0, Family history of malignant neoplasm of                        digestive organs                       Z86.010, Personal history of colonic polyps  K64.0, First degree hemorrhoids                       D12.2, Benign neoplasm of ascending colon                       K57.30, Diverticulosis of large intestine without                        perforation or abscess without bleeding CPT copyright 2016 American Medical Association. All rights reserved. The codes documented in this report are preliminary and upon coder review may  be revised to meet current compliance requirements. Efrain Sella MD, MD 09/23/2017 4:07:44 PM This report has been signed electronically. Number of Addenda: 0 Note Initiated On: 09/23/2017 3:09 PM Scope Withdrawal Time: 0 hours 31 minutes 40 seconds  Total Procedure Duration: 0 hours 40 minutes 46 seconds       [facility]

## 2017-09-23 NOTE — Transfer of Care (Signed)
Immediate Anesthesia Transfer of Care Note  Patient: Laura Hale  Procedure(s) Performed: COLONOSCOPY WITH PROPOFOL (N/A )  Patient Location: PACU  Anesthesia Type:General  Level of Consciousness: awake, alert  and oriented  Airway & Oxygen Therapy: Patient Spontanous Breathing and Patient connected to nasal cannula oxygen  Post-op Assessment: Report given to RN and Post -op Vital signs reviewed and stable  Post vital signs: Reviewed and stable  Last Vitals:  Vitals:   09/23/17 1422  BP: (!) 166/78  Pulse: 97  Resp: 20  Temp: 36.5 C  SpO2: 97%    Last Pain:  Vitals:   09/23/17 1422  TempSrc: Tympanic      Patients Stated Pain Goal: 0 (90/38/33 3832)  Complications: No apparent anesthesia complications

## 2017-09-24 ENCOUNTER — Encounter: Payer: Self-pay | Admitting: Internal Medicine

## 2017-09-25 ENCOUNTER — Ambulatory Visit: Payer: PPO | Admitting: Physical Therapy

## 2017-09-25 ENCOUNTER — Encounter: Payer: Self-pay | Admitting: Physical Therapy

## 2017-09-25 DIAGNOSIS — M25572 Pain in left ankle and joints of left foot: Secondary | ICD-10-CM

## 2017-09-25 DIAGNOSIS — R262 Difficulty in walking, not elsewhere classified: Secondary | ICD-10-CM

## 2017-09-25 DIAGNOSIS — M6281 Muscle weakness (generalized): Secondary | ICD-10-CM

## 2017-09-25 DIAGNOSIS — R2689 Other abnormalities of gait and mobility: Secondary | ICD-10-CM

## 2017-09-25 NOTE — Therapy (Signed)
[facility] [address] [name], [address] Phone: [phone]   Fax:  [phone]  Physical Therapy Treatment  Patient Details  Name: Laura Hale MRN: [id number] Date of Birth: 11-Dec-1945 Referring Provider: Samara Deist DPM   Encounter Date: 09/25/2017  PT End of Session - 09/25/17 0815    Visit Number  4    Number of Visits  12    Date for PT Re-Evaluation  10/28/17    PT Start Time  0811    PT Stop Time  0900    PT Time Calculation (min)  49 min    Activity Tolerance  Patient tolerated treatment well;Patient limited by pain    Behavior During Therapy  [facility] for tasks assessed/performed       Past Medical History:  Diagnosis Date  . Arthritis    elbow, hands, neck   . Asthma    allergy induced asthma-only exacerbated by smells like perfume, smoke etc  . Atypical childhood psychosis 2007   myoview -negative for ischemia with preserved LV function  . Colon polyps   . Complication of anesthesia    WITH ACDF IN JUNE 2016-PT STATES SHE LOST HER VOICE X 10 WEEKS DUE TO INTUBATION  . COPD (chronic obstructive pulmonary disease) ([place])   . Edema    LEGS/FEET  . Hypercalcemia 2009   calcium  WNL 10.2 on 04-2015  . Hyperlipidemia   . Obesity   . Urinary incontinence     Past Surgical History:  Procedure Laterality Date  . ABDOMINAL HYSTERECTOMY    . ANTERIOR CERVICAL DECOMP/DISCECTOMY FUSION N/A 01/31/2015   Procedure: ANTERIOR CERVICAL DECOMPRESSION/DISCECTOMY FUSION CERVICAL FIVE SIX;  Surgeon: Karie Chimera, MD;  Location: [facility];  Service: Neurosurgery;  Laterality: N/A;  . BACK SURGERY    . CATARACT EXTRACTION W/PHACO Left 02/06/2017   Procedure: CATARACT EXTRACTION PHACO AND INTRAOCULAR LENS PLACEMENT (IOC);  Surgeon: Eulogio Bear, MD;  Location: [facility];  Service: Ophthalmology;  Laterality: Left;  Lot# [id number] H [place]: 00:37.9 AP%: 7.9 CDE: 2.97  . CATARACT EXTRACTION W/PHACO  Right 03/06/2017   Procedure: CATARACT EXTRACTION PHACO AND INTRAOCULAR LENS PLACEMENT (IOC);  Surgeon: Eulogio Bear, MD;  Location: [facility];  Service: Ophthalmology;  Laterality: Right;  Lot #[id number] H [place]: 00.22.0 AP%: 7.5 CDE: 1.64   . COLONOSCOPY    . COLONOSCOPY WITH PROPOFOL N/A 09/23/2017   Procedure: COLONOSCOPY WITH PROPOFOL;  Surgeon: Toledo, Benay Pike, MD;  Location: [facility];  Service: Gastroenterology;  Laterality: N/A;  . ELBOW SURGERY Left 04/2003   x2  & for repair & then remove all hardware, due to injury from a fall  . EYE SURGERY    . FRACTURE SURGERY    . HAND SURGERY Right 2006   injury- then eventually lost remainder of thumb  . KNEE ARTHROSCOPY WITH LATERAL MENISECTOMY Left 01/23/2016   Procedure: KNEE ARTHROSCOPY WITH LATERAL MENISECTOMY;  Surgeon: Corky Mull, MD;  Location: [facility];  Service: Orthopedics;  Laterality: Left;  . ORIF HUMERUS FRACTURE Right 08/31/2015   Procedure: OPEN REDUCTION INTERNAL FIXATION (ORIF) PROXIMAL HUMERUS FRACTURE w/ #2 fberwire;  Surgeon: Corky Mull, MD;  Location: [facility];  Service: Orthopedics;  Laterality: Right;  . 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  Subjective Assessment - 09/25/17 0814    Subjective  Patient reports she is noticing that her left heel is feeling looser  with treatment. she is still not able to wear a shoe with a back due to irritation on heel.    Pertinent History  History of pain left heel/ankle x 1 year that has worsened and she is not unable to stand or walk without severe difficulty. She is limited in walking, standing activities which interferes with her daily like at home and in community.     Limitations  Standing;Walking;House hold activities;Other (comment) recreational activities, stairs    How long can you stand comfortably?  <30 min.    How long can you walk comfortably?  <60 min.    Diagnostic tests  X ray: calcaneal spur left ankle,  cacification in Achilles tendon    Patient Stated Goals  Patient would like to walk, stand and return to normal activties without pain or swelling left ankle.    Currently in Pain?  Yes    Pain Score  5     Pain Location  Ankle    Pain Orientation  Left    Pain Descriptors / Indicators  Aching;Tightness;Sore;Shooting    Pain Type  Chronic pain    Pain Onset  More than a month ago    Pain Frequency  Constant         Objective: Left ankle/heel: moderate swelling with decreased soft tissue mobility; very mild warmth and decreased as compared to previous session  Treatment:  Modalities:  Ultrasound:  1 and 3MHz 50% pulsed 1.2 w/cm2 x50min. to posterior aspect left ankle/ Achilles tendon with patient prone with left LE supported on pillow goal: pain, inflammation, swelling  Iontophoresis with dexamethasone 4mg /ml applied 47ma*min using medium sized electrode to posterior aspect of left foot/heel over swollen area with patient supine lying; goal: pain, inflammation; no adverse reactions noted (application 13 min)  High volt estimx 20 min.(2) electrodes applied to left ankle, medial/lateral aspectwith patient supine with left LE supported on pillow, intensity to tolerance: goal: pain(applied in conjunction with iontophoresis)   Patient response to treatment:improved soft tissue elasticity at end of session by 30% and patient reporting heel feeling looser in heel with walking at end of session.    PT Education - 09/25/17 1026    Education provided  Yes    Education Details  re assessed home instructions including elevation and ice to control inflammation and swelling    Person(s) Educated  Patient    Methods  Explanation    Comprehension  Verbalized understanding          PT Long Term Goals - 09/16/17 0951      PT LONG TERM GOAL #1   Title  Patient will demonstrate improved function with daily tasks as indicated by LEFS score of 40/80     Baseline  LEFS 27/80    Status   New    Target Date  10/07/17      PT LONG TERM GOAL #2   Title  improved FADI score to 40% or better indcating improved function with standing, going down steps, walking, standing activties    Baseline  FADI 56%     Status  New    Target Date  10/14/17      PT LONG TERM GOAL #3   Title  improved FADI score to 30% or less demonstrating improved function with daily tasks involving standing, walking    Baseline  FADI 56%    Status  New    Target Date  10/28/17      PT LONG TERM GOAL #4  Title  patient will be indepenent with self management of pain, home program for ROM, strength by discharge to continue progressing towards prior level of function.     Baseline  limited knowledge of appropriate pain control strategies, exercises and progression without assistance, guidance and cuing    Status  New    Target Date  10/28/17            Plan - 09/25/17 3235    Clinical Impression Statement  Patient continues to improve with current treatment. she is still unable to wear closed back shoes due to swelling and pain on heel. She will benefit from additional physical therapy intervention to achieve goals.     Rehab Potential  Fair    Clinical Impairments Affecting Rehab Potential  (+)motivated, prior level of function (-)chronic condition, X ray report calcaneal spur with cacifications in tendon    PT Frequency  2x / week    PT Duration  6 weeks    PT Treatment/Interventions  Neuromuscular re-education;Manual techniques;Ultrasound;Electrical Stimulation;Iontophoresis 4mg /ml Dexamethasone;Moist Heat;Therapeutic exercise;Therapeutic activities;Patient/family education    PT Next Visit Plan  pain control, iointo, [place], estim., therapeutic exercise, manual techniques    PT Home Exercise Plan  ice massage and ROM , weight shifting as able       Patient will benefit from skilled therapeutic intervention in order to improve the following deficits and impairments:  Abnormal gait, Pain, Decreased  activity tolerance, Decreased range of motion, Decreased strength, Difficulty walking, Obesity  Visit Diagnosis: Pain in left ankle and joints of left foot  Muscle weakness (generalized)  Other abnormalities of gait and mobility  Difficulty in walking, not elsewhere classified     Problem List Patient Active Problem List   Diagnosis Date Noted  . 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    Jomarie Longs PT 09/26/2017, 9:33 AM  [facility] [address] [name], [address] Phone: [phone]   Fax:  [phone]  Name: KINJAL NEITZKE MRN: [id number] Date of Birth: 1945/12/25

## 2017-09-26 LAB — SURGICAL PATHOLOGY

## 2017-09-30 ENCOUNTER — Ambulatory Visit: Payer: PPO | Admitting: Physical Therapy

## 2017-09-30 DIAGNOSIS — M25572 Pain in left ankle and joints of left foot: Secondary | ICD-10-CM | POA: Diagnosis not present

## 2017-09-30 DIAGNOSIS — R2689 Other abnormalities of gait and mobility: Secondary | ICD-10-CM

## 2017-09-30 DIAGNOSIS — R262 Difficulty in walking, not elsewhere classified: Secondary | ICD-10-CM

## 2017-09-30 DIAGNOSIS — M6281 Muscle weakness (generalized): Secondary | ICD-10-CM

## 2017-10-01 NOTE — Therapy (Signed)
[facility] [address], [name], [address] Phone: [phone]   Fax:  [phone]  Physical Therapy Treatment  Patient Details  Name: Laura Hale MRN: [id number] Date of Birth: 1945-09-22 Referring Provider: Samara Deist DPM   Encounter Date: 09/30/2017  PT End of Session - 09/30/17 0940    Visit Number  5    Number of Visits  12    Date for PT Re-Evaluation  10/28/17    PT Start Time  0848    PT Stop Time  0937    PT Time Calculation (min)  49 min    Activity Tolerance  Patient tolerated treatment well;Patient limited by pain    Behavior During Therapy  [facility] for tasks assessed/performed       Past Medical History:  Diagnosis Date  . Arthritis    elbow, hands, neck   . Asthma    allergy induced asthma-only exacerbated by smells like perfume, smoke etc  . Atypical childhood psychosis 2007   myoview -negative for ischemia with preserved LV function  . Colon polyps   . Complication of anesthesia    WITH ACDF IN JUNE 2016-PT STATES SHE LOST HER VOICE X 10 WEEKS DUE TO INTUBATION  . COPD (chronic obstructive pulmonary disease) ([place])   . Edema    LEGS/FEET  . Hypercalcemia 2009   calcium  WNL 10.2 on 04-2015  . Hyperlipidemia   . Obesity   . Urinary incontinence     Past Surgical History:  Procedure Laterality Date  . ABDOMINAL HYSTERECTOMY    . ANTERIOR CERVICAL DECOMP/DISCECTOMY FUSION N/A 01/31/2015   Procedure: ANTERIOR CERVICAL DECOMPRESSION/DISCECTOMY FUSION CERVICAL FIVE SIX;  Surgeon: Karie Chimera, MD;  Location: [facility];  Service: Neurosurgery;  Laterality: N/A;  . BACK SURGERY    . CATARACT EXTRACTION W/PHACO Left 02/06/2017   Procedure: CATARACT EXTRACTION PHACO AND INTRAOCULAR LENS PLACEMENT (IOC);  Surgeon: Eulogio Bear, MD;  Location: [facility];  Service: Ophthalmology;  Laterality: Left;  Lot# [id number] H [place]: 00:37.9 AP%: 7.9 CDE: 2.97  . CATARACT EXTRACTION W/PHACO  Right 03/06/2017   Procedure: CATARACT EXTRACTION PHACO AND INTRAOCULAR LENS PLACEMENT (IOC);  Surgeon: Eulogio Bear, MD;  Location: [facility];  Service: Ophthalmology;  Laterality: Right;  Lot #[id number] H [place]: 00.22.0 AP%: 7.5 CDE: 1.64   . COLONOSCOPY    . COLONOSCOPY WITH PROPOFOL N/A 09/23/2017   Procedure: COLONOSCOPY WITH PROPOFOL;  Surgeon: Toledo, Benay Pike, MD;  Location: [facility];  Service: Gastroenterology;  Laterality: N/A;  . ELBOW SURGERY Left 04/2003   x2  & for repair & then remove all hardware, due to injury from a fall  . EYE SURGERY    . FRACTURE SURGERY    . HAND SURGERY Right 2006   injury- then eventually lost remainder of thumb  . KNEE ARTHROSCOPY WITH LATERAL MENISECTOMY Left 01/23/2016   Procedure: KNEE ARTHROSCOPY WITH LATERAL MENISECTOMY;  Surgeon: Corky Mull, MD;  Location: [facility];  Service: Orthopedics;  Laterality: Left;  . ORIF HUMERUS FRACTURE Right 08/31/2015   Procedure: OPEN REDUCTION INTERNAL FIXATION (ORIF) PROXIMAL HUMERUS FRACTURE w/ #2 fberwire;  Surgeon: Corky Mull, MD;  Location: [facility];  Service: Orthopedics;  Laterality: Right;  . 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  Subjective Assessment - 09/30/17 0851    Subjective  Patient reports she is noticing decreasing pain and improved flexibiltiy in soft  tissue in left heel with treatment.    Pertinent History  History of pain left heel/ankle x 1 year that has worsened and she is not unable to stand or walk without severe difficulty. She is limited in walking, standing activities which interferes with her daily like at home and in community.     Limitations  Standing;Walking;House hold activities;Other (comment) recreational activities, stairs    How long can you stand comfortably?  <30 min.    How long can you walk comfortably?  <60 min.    Diagnostic tests  X ray: calcaneal spur left ankle, cacification in Achilles tendon    Patient Stated  Goals  Patient would like to walk, stand and return to normal activties without pain or swelling left ankle.    Currently in Pain?  Yes    Pain Score  3     Pain Location  Ankle    Pain Orientation  Left    Pain Descriptors / Indicators  Aching;Tightness;Sore    Pain Type  Chronic pain    Pain Onset  More than a month ago    Pain Frequency  Constant       Objective: Left ankle/heel:increased warmth posterior aspect left heel, moderate swelling posterior aspect left heel, decreased soft tissue elasticity  Treatment:  Modalities:  Ultrasound: 1 and3MHz 50% pulsed 1.2 w/cm2 x72min. to posterior aspect left ankle/ Achilles tendon with patient prone with left LE supported on pillow goal: pain,inflammation,swelling  Iontophoresis with dexamethasone 4mg /ml applied 32ma*min using medium sized electrode to posterior aspect of left foot/heel over swollen area with patientsupine lying;goal: pain, inflammation; no adverse reactions noted (application 13 min)  High volt estimx34min.(2) electrodes applied to left ankle, medial/lateral aspectwith patient supine with left LE supported on pillow, intensity to tolerance: goal: pain(applied in conjunction with iontophoresis)  Patient response to treatment:Improved soft tissue elasticity at end of session by 30% and improved pain level to very mild. No adverse reaction noted with treatment.     PT Education - 09/30/17 [id number]    Education provided  Yes    Education Details  continue with ROM, joint protection at home     Person(s) Educated  Patient    Methods  Explanation    Comprehension  Verbalized understanding          PT Long Term Goals - 09/16/17 0951      PT LONG TERM GOAL #1   Title  Patient will demonstrate improved function with daily tasks as indicated by LEFS score of 40/80     Baseline  LEFS 27/80    Status  New    Target Date  10/07/17      PT LONG TERM GOAL #2   Title  improved FADI score to 40% or better  indcating improved function with standing, going down steps, walking, standing activties    Baseline  FADI 56%     Status  New    Target Date  10/14/17      PT LONG TERM GOAL #3   Title  improved FADI score to 30% or less demonstrating improved function with daily tasks involving standing, walking    Baseline  FADI 56%    Status  New    Target Date  10/28/17      PT LONG TERM GOAL #4   Title  patient will be indepenent with self management of pain, home program for ROM, strength by discharge to continue progressing towards prior level of function.  Baseline  limited knowledge of appropriate pain control strategies, exercises and progression without assistance, guidance and cuing    Status  New    Target Date  10/28/17            Plan - 09/30/17 0935    Clinical Impression Statement  Patient is progressing steadily with goals with good carry over of pain relief between sessions. She continues to wear sandals to not aggravate her heel pain and she will benefit from continued physical therapy intervention to achieve goals.     Rehab Potential  Fair    Clinical Impairments Affecting Rehab Potential  (+)motivated, prior level of function (-)chronic condition, X ray report calcaneal spur with cacifications in tendon    PT Frequency  2x / week    PT Duration  6 weeks    PT Treatment/Interventions  Neuromuscular re-education;Manual techniques;Ultrasound;Electrical Stimulation;Iontophoresis 4mg /ml Dexamethasone;Moist Heat;Therapeutic exercise;Therapeutic activities;Patient/family education    PT Next Visit Plan  pain control, iointo, [place], estim., therapeutic exercise, manual techniques    PT Home Exercise Plan  ice massage and ROM , weight shifting as able       Patient will benefit from skilled therapeutic intervention in order to improve the following deficits and impairments:  Abnormal gait, Pain, Decreased activity tolerance, Decreased range of motion, Decreased strength, Difficulty  walking, Obesity  Visit Diagnosis: Pain in left ankle and joints of left foot  Muscle weakness (generalized)  Other abnormalities of gait and mobility  Difficulty in walking, not elsewhere classified     Problem List Patient Active Problem List   Diagnosis Date Noted  . 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    Jomarie Longs PT 10/01/2017, 8:56 AM  [facility] [address], [name], [address] Phone: [phone]   Fax:  [phone]  Name: Laura Hale MRN: [id number] Date of Birth: 07-Jul-1946

## 2017-10-02 ENCOUNTER — Encounter: Payer: Self-pay | Admitting: Physical Therapy

## 2017-10-02 ENCOUNTER — Ambulatory Visit: Payer: PPO | Admitting: Physical Therapy

## 2017-10-02 DIAGNOSIS — R2689 Other abnormalities of gait and mobility: Secondary | ICD-10-CM

## 2017-10-02 DIAGNOSIS — R262 Difficulty in walking, not elsewhere classified: Secondary | ICD-10-CM

## 2017-10-02 DIAGNOSIS — M25572 Pain in left ankle and joints of left foot: Secondary | ICD-10-CM | POA: Diagnosis not present

## 2017-10-02 DIAGNOSIS — M6281 Muscle weakness (generalized): Secondary | ICD-10-CM

## 2017-10-02 NOTE — Therapy (Signed)
[facility] [address] [name], [address] Phone: [phone]   Fax:  [phone]  Physical Therapy Treatment  Patient Details  Name: Laura Hale MRN: [id number] Date of Birth: 02/29/1946 Referring Provider: Samara Deist DPM   Encounter Date: 10/02/2017  PT End of Session - 10/02/17 0817    Visit Number  6    Number of Visits  12    Date for PT Re-Evaluation  10/28/17    PT Start Time  0812    PT Stop Time  0905    PT Time Calculation (min)  53 min    Activity Tolerance  Patient tolerated treatment well;Patient limited by pain    Behavior During Therapy  [facility] for tasks assessed/performed       Past Medical History:  Diagnosis Date  . Arthritis    elbow, hands, neck   . Asthma    allergy induced asthma-only exacerbated by smells like perfume, smoke etc  . Atypical childhood psychosis 2007   myoview -negative for ischemia with preserved LV function  . Colon polyps   . Complication of anesthesia    WITH ACDF IN JUNE 2016-PT STATES SHE LOST HER VOICE X 10 WEEKS DUE TO INTUBATION  . COPD (chronic obstructive pulmonary disease) ([place])   . Edema    LEGS/FEET  . Hypercalcemia 2009   calcium  WNL 10.2 on 04-2015  . Hyperlipidemia   . Obesity   . Urinary incontinence     Past Surgical History:  Procedure Laterality Date  . ABDOMINAL HYSTERECTOMY    . ANTERIOR CERVICAL DECOMP/DISCECTOMY FUSION N/A 01/31/2015   Procedure: ANTERIOR CERVICAL DECOMPRESSION/DISCECTOMY FUSION CERVICAL FIVE SIX;  Surgeon: Karie Chimera, MD;  Location: [facility];  Service: Neurosurgery;  Laterality: N/A;  . BACK SURGERY    . CATARACT EXTRACTION W/PHACO Left 02/06/2017   Procedure: CATARACT EXTRACTION PHACO AND INTRAOCULAR LENS PLACEMENT (IOC);  Surgeon: Eulogio Bear, MD;  Location: [facility];  Service: Ophthalmology;  Laterality: Left;  Lot# [id number] H [place]: 00:37.9 AP%: 7.9 CDE: 2.97  . CATARACT EXTRACTION W/PHACO  Right 03/06/2017   Procedure: CATARACT EXTRACTION PHACO AND INTRAOCULAR LENS PLACEMENT (IOC);  Surgeon: Eulogio Bear, MD;  Location: [facility];  Service: Ophthalmology;  Laterality: Right;  Lot #[id number] H [place]: 00.22.0 AP%: 7.5 CDE: 1.64   . COLONOSCOPY    . COLONOSCOPY WITH PROPOFOL N/A 09/23/2017   Procedure: COLONOSCOPY WITH PROPOFOL;  Surgeon: Toledo, Benay Pike, MD;  Location: [facility];  Service: Gastroenterology;  Laterality: N/A;  . ELBOW SURGERY Left 04/2003   x2  & for repair & then remove all hardware, due to injury from a fall  . EYE SURGERY    . FRACTURE SURGERY    . HAND SURGERY Right 2006   injury- then eventually lost remainder of thumb  . KNEE ARTHROSCOPY WITH LATERAL MENISECTOMY Left 01/23/2016   Procedure: KNEE ARTHROSCOPY WITH LATERAL MENISECTOMY;  Surgeon: Corky Mull, MD;  Location: [facility];  Service: Orthopedics;  Laterality: Left;  . ORIF HUMERUS FRACTURE Right 08/31/2015   Procedure: OPEN REDUCTION INTERNAL FIXATION (ORIF) PROXIMAL HUMERUS FRACTURE w/ #2 fberwire;  Surgeon: Corky Mull, MD;  Location: [facility]S;  Service: Orthopedics;  Laterality: Right;  . 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  Subjective Assessment - 10/02/17 0815    Subjective  Patient up and walking a lot yesterday and is feeling more swollen  yesterday.     Pertinent History  History of pain left heel/ankle x 1 year that has worsened and she is not unable to stand or walk without severe difficulty. She is limited in walking, standing activities which interferes with her daily like at home and in community.     Limitations  Standing;Walking;House hold activities;Other (comment) recreational activities, stairs    How long can you stand comfortably?  <30 min.    How long can you walk comfortably?  <60 min.    Diagnostic tests  X ray: calcaneal spur left ankle, cacification in Achilles tendon    Patient Stated Goals  Patient would like to walk, stand  and return to normal activties without pain or swelling left ankle.    Currently in Pain?  Yes    Pain Score  4     Pain Location  Ankle    Pain Orientation  Left    Pain Descriptors / Indicators  Aching;Sore;Tightness    Pain Type  Chronic pain    Pain Onset  More than a month ago    Pain Frequency  Constant         Objective: Left ankle/heel:mild warmth posterior lateral aspect; moderate swelling posterior aspect left heel, decreased soft tissue elasticity and + spasms palpable lateral calf muscles  Treatment:  Manual therapy: 10 min.: goal: spasms, improve soft tissue elasticity, decrease pain STM performed to left lower leg calf with focus on lateral aspect, spasms, superficial techniques; patient prone lying with pillow supporting lower legs  Modalities:  Ultrasound: 1 and3MHz 50% pulsed 1.2 w/cm2 x71min. to posterior aspect left ankle/ Achilles tendon with patient prone with left LE supported on pillow goal: pain,inflammation,swelling  Iontophoresis with dexamethasone 4mg /ml applied 26ma*min using medium sized electrode to posterior aspect of left foot/heel over swollen area with patientsupine lying;goal: pain, inflammation; no adverse reactions noted (application [id number])  High volt estimx40min.(2) electrodes applied to left calf lateral aspectwith patient supine with left LE supported on pillow, intensity to tolerance: goal: pain(applied in conjunction with iontophoresis)  Patient response to treatment:improved soft tissue elasticity with decreased pain reported by up to 50% following treatment. No adverse reaction noted with treatment.      PT Education - 10/02/17 [phone]    Education provided  Yes    Education Details  continue with ROM and pain control    Person(s) Educated  Patient    Methods  Explanation    Comprehension  Verbalized understanding          PT Long Term Goals - 09/16/17 0951      PT LONG TERM GOAL #1   Title  Patient will  demonstrate improved function with daily tasks as indicated by LEFS score of 40/80     Baseline  LEFS 27/80    Status  New    Target Date  10/07/17      PT LONG TERM GOAL #2   Title  improved FADI score to 40% or better indcating improved function with standing, going down steps, walking, standing activties    Baseline  FADI 56%     Status  New    Target Date  10/14/17      PT LONG TERM GOAL #3   Title  improved FADI score to 30% or less demonstrating improved function with daily tasks involving standing, walking    Baseline  FADI 56%    Status  New    Target Date  10/28/17      PT LONG  TERM GOAL #4   Title  patient will be indepenent with self management of pain, home program for ROM, strength by discharge to continue progressing towards prior level of function.     Baseline  limited knowledge of appropriate pain control strategies, exercises and progression without assistance, guidance and cuing    Status  New    Target Date  10/28/17            Plan - 10/02/17 0909    Clinical Impression Statement  Patient continues to respond well to current interventions with improving soft tissue elasticity and decreased pain by 50% at end of session. she continues with difficulty and pain with prolonged standing/walking and will therefore benefit from continued physical therapy intervention.     Rehab Potential  Fair    Clinical Impairments Affecting Rehab Potential  (+)motivated, prior level of function (-)chronic condition, X ray report calcaneal spur with cacifications in tendon    PT Frequency  2x / week    PT Duration  6 weeks    PT Treatment/Interventions  Neuromuscular re-education;Manual techniques;Ultrasound;Electrical Stimulation;Iontophoresis 4mg /ml Dexamethasone;Moist Heat;Therapeutic exercise;Therapeutic activities;Patient/family education    PT Next Visit Plan  pain control, iointo, [place], estim., therapeutic exercise, manual techniques    PT Home Exercise Plan  ice massage and  ROM , weight shifting as able       Patient will benefit from skilled therapeutic intervention in order to improve the following deficits and impairments:  Abnormal gait, Pain, Decreased activity tolerance, Decreased range of motion, Decreased strength, Difficulty walking, Obesity  Visit Diagnosis: Pain in left ankle and joints of left foot  Muscle weakness (generalized)  Other abnormalities of gait and mobility  Difficulty in walking, not elsewhere classified     Problem List Patient Active Problem List   Diagnosis Date Noted  . 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    Jomarie Longs PT 10/02/2017, 9:11 AM  [facility] [address], [name], [address] Phone: [phone]   Fax:  [phone]  Name: Laura Hale MRN: [id number] Date of Birth: 08/18/1945

## 2017-10-07 ENCOUNTER — Encounter: Payer: Self-pay | Admitting: Physical Therapy

## 2017-10-07 ENCOUNTER — Ambulatory Visit: Payer: PPO | Admitting: Physical Therapy

## 2017-10-07 DIAGNOSIS — M25572 Pain in left ankle and joints of left foot: Secondary | ICD-10-CM

## 2017-10-07 DIAGNOSIS — R2689 Other abnormalities of gait and mobility: Secondary | ICD-10-CM

## 2017-10-07 DIAGNOSIS — R262 Difficulty in walking, not elsewhere classified: Secondary | ICD-10-CM

## 2017-10-07 DIAGNOSIS — M6281 Muscle weakness (generalized): Secondary | ICD-10-CM

## 2017-10-07 NOTE — Therapy (Signed)
[facility] [address] [name], [address] Phone: [phone]   Fax:  [phone]  Physical Therapy Treatment  Patient Details  Name: Laura Hale MRN: [id number] Date of Birth: August 06, 1946 Referring Provider: Samara Deist DPM   Encounter Date: 10/07/2017  PT End of Session - 10/07/17 0808    Visit Number  7    Number of Visits  12    Date for PT Re-Evaluation  10/28/17    PT Start Time  0733    PT Stop Time  0816    PT Time Calculation (min)  43 min    Activity Tolerance  Patient tolerated treatment well;Patient limited by pain    Behavior During Therapy  [facility] for tasks assessed/performed       Past Medical History:  Diagnosis Date  . Arthritis    elbow, hands, neck   . Asthma    allergy induced asthma-only exacerbated by smells like perfume, smoke etc  . Atypical childhood psychosis 2007   myoview -negative for ischemia with preserved LV function  . Colon polyps   . Complication of anesthesia    WITH ACDF IN JUNE 2016-PT STATES SHE LOST HER VOICE X 10 WEEKS DUE TO INTUBATION  . COPD (chronic obstructive pulmonary disease) ([place])   . Edema    LEGS/FEET  . Hypercalcemia 2009   calcium  WNL 10.2 on 04-2015  . Hyperlipidemia   . Obesity   . Urinary incontinence     Past Surgical History:  Procedure Laterality Date  . ABDOMINAL HYSTERECTOMY    . ANTERIOR CERVICAL DECOMP/DISCECTOMY FUSION N/A 01/31/2015   Procedure: ANTERIOR CERVICAL DECOMPRESSION/DISCECTOMY FUSION CERVICAL FIVE SIX;  Surgeon: Karie Chimera, MD;  Location: [facility];  Service: Neurosurgery;  Laterality: N/A;  . BACK SURGERY    . CATARACT EXTRACTION W/PHACO Left 02/06/2017   Procedure: CATARACT EXTRACTION PHACO AND INTRAOCULAR LENS PLACEMENT (IOC);  Surgeon: Eulogio Bear, MD;  Location: [facility];  Service: Ophthalmology;  Laterality: Left;  Lot# [id number] H [place]: 00:37.9 AP%: 7.9 CDE: 2.97  . CATARACT EXTRACTION W/PHACO  Right 03/06/2017   Procedure: CATARACT EXTRACTION PHACO AND INTRAOCULAR LENS PLACEMENT (IOC);  Surgeon: Eulogio Bear, MD;  Location: [facility];  Service: Ophthalmology;  Laterality: Right;  Lot #[id number] H [place]: 00.22.0 AP%: 7.5 CDE: 1.64   . COLONOSCOPY    . COLONOSCOPY WITH PROPOFOL N/A 09/23/2017   Procedure: COLONOSCOPY WITH PROPOFOL;  Surgeon: Toledo, Benay Pike, MD;  Location: [facility];  Service: Gastroenterology;  Laterality: N/A;  . ELBOW SURGERY Left 04/2003   x2  & for repair & then remove all hardware, due to injury from a fall  . EYE SURGERY    . FRACTURE SURGERY    . HAND SURGERY Right 2006   injury- then eventually lost remainder of thumb  . KNEE ARTHROSCOPY WITH LATERAL MENISECTOMY Left 01/23/2016   Procedure: KNEE ARTHROSCOPY WITH LATERAL MENISECTOMY;  Surgeon: Corky Mull, MD;  Location: [facility];  Service: Orthopedics;  Laterality: Left;  . ORIF HUMERUS FRACTURE Right 08/31/2015   Procedure: OPEN REDUCTION INTERNAL FIXATION (ORIF) PROXIMAL HUMERUS FRACTURE w/ #2 fberwire;  Surgeon: Corky Mull, MD;  Location: [facility];  Service: Orthopedics;  Laterality: Right;  . 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  Subjective Assessment - 10/07/17 0735    Subjective  More swollen today due to eating badly and being up or sitting  a lot yesterday.     Pertinent History  History of pain left heel/ankle x 1 year that has worsened and she is not unable to stand or walk without severe difficulty. She is limited in walking, standing activities which interferes with her daily like at home and in community.     Limitations  Standing;Walking;House hold activities;Other (comment) recreational activities, stairs    How long can you stand comfortably?  <30 min.    How long can you walk comfortably?  <60 min.    Diagnostic tests  X ray: calcaneal spur left ankle, cacification in Achilles tendon    Patient Stated Goals  Patient would like to walk,  stand and return to normal activties without pain or swelling left ankle.    Currently in Pain?  Yes    Pain Score  5     Pain Location  Ankle    Pain Orientation  Left    Pain Descriptors / Indicators  Tightness    Pain Type  Chronic pain    Pain Onset  More than a month ago    Pain Frequency  Constant            Objective: Left ankle/heel:mild warmth posterior lateral aspect; moderate swelling posterior aspect left heel, decreased soft tissue elasticity and + spasms palpable lateral and medial calf muscles Gait: antalgic with decreased left ankle DF, increased toe out and decreased push off left LE  Treatment:  Manual therapy: 10 min.: goal: spasms, improve soft tissue elasticity, decrease pain STM performed to left lower leg calf with focus on lateral aspect, spasms, superficial techniques; patient prone lying with pillow supporting lower legs  Modalities:  Ultrasound: 1 and3MHz 50% pulsed 1.2 w/cm2 x81min. to posterior aspect left ankle/ Achilles tendon with patient prone with left LE supported on pillow goal: pain,inflammation,swelling  Iontophoresis with dexamethasone 4mg /ml applied 41ma*min using medium sized electrode to posterior aspect of left foot/heel over swollen area with patientsupine lying;goal: pain, inflammation; no adverse reactions noted (application74min)  High volt estimx55min.(2) electrodes applied to left calf with patient supine with left LE supported on pillow, intensity to tolerance: goal: pain(applied in conjunction with iontophoresis)  Patient response to treatment:improved soft tissue and decreased swelling by 30% at end of session. improved gait pattern with decreased limp and reported decreased stiffness in left ankle/heel. No adverse reactions noted with treatment.     PT Education - 10/07/17 0900    Education provided  Yes    Education Details  re assessed home program, elevation to decrease swelling    Person(s) Educated   Patient    Methods  Explanation    Comprehension  Verbalized understanding          PT Long Term Goals - 09/16/17 0951      PT LONG TERM GOAL #1   Title  Patient will demonstrate improved function with daily tasks as indicated by LEFS score of 40/80     Baseline  LEFS 27/80    Status  New    Target Date  10/07/17      PT LONG TERM GOAL #2   Title  improved FADI score to 40% or better indcating improved function with standing, going down steps, walking, standing activties    Baseline  FADI 56%     Status  New    Target Date  10/14/17      PT LONG TERM GOAL #3   Title  improved FADI score to 30% or less demonstrating improved function  with daily tasks involving standing, walking    Baseline  FADI 56%    Status  New    Target Date  10/28/17      PT LONG TERM GOAL #4   Title  patient will be indepenent with self management of pain, home program for ROM, strength by discharge to continue progressing towards prior level of function.     Baseline  limited knowledge of appropriate pain control strategies, exercises and progression without assistance, guidance and cuing    Status  New    Target Date  10/28/17            Plan - 10/07/17 0808    Clinical Impression Statement  Patient is progressing steadily with decreasing swelling, pain, improving soft tissue elasticity with current treatment. She continues with difficulty with walking and wearing shoes with closed backs and will benefit from continued physical therapy intervention.     Rehab Potential  Fair    Clinical Impairments Affecting Rehab Potential  (+)motivated, prior level of function (-)chronic condition, X ray report calcaneal spur with cacifications in tendon    PT Frequency  2x / week    PT Duration  6 weeks    PT Treatment/Interventions  Neuromuscular re-education;Manual techniques;Ultrasound;Electrical Stimulation;Iontophoresis 4mg /ml Dexamethasone;Moist Heat;Therapeutic exercise;Therapeutic  activities;Patient/family education    PT Next Visit Plan  pain control, iointo, [place], estim., therapeutic exercise, manual techniques    PT Home Exercise Plan  ice massage and ROM , weight shifting as able       Patient will benefit from skilled therapeutic intervention in order to improve the following deficits and impairments:  Abnormal gait, Pain, Decreased activity tolerance, Decreased range of motion, Decreased strength, Difficulty walking, Obesity  Visit Diagnosis: Pain in left ankle and joints of left foot  Muscle weakness (generalized)  Other abnormalities of gait and mobility  Difficulty in walking, not elsewhere classified     Problem List Patient Active Problem List   Diagnosis Date Noted  . 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    Jomarie Longs PT 10/07/2017, 6:34 PM  [facility] [address], [name], [address] Phone: [phone]   Fax:  [phone]  Name: Laura Hale MRN: [id number] Date of Birth: [birth year]

## 2017-10-09 ENCOUNTER — Encounter: Payer: Self-pay | Admitting: Physical Therapy

## 2017-10-09 ENCOUNTER — Ambulatory Visit: Payer: PPO | Admitting: Physical Therapy

## 2017-10-09 DIAGNOSIS — M25572 Pain in left ankle and joints of left foot: Secondary | ICD-10-CM

## 2017-10-09 DIAGNOSIS — M6281 Muscle weakness (generalized): Secondary | ICD-10-CM

## 2017-10-09 DIAGNOSIS — R2689 Other abnormalities of gait and mobility: Secondary | ICD-10-CM

## 2017-10-09 DIAGNOSIS — R262 Difficulty in walking, not elsewhere classified: Secondary | ICD-10-CM

## 2017-10-09 NOTE — Therapy (Signed)
[facility] [address] [name], [address] Phone: [phone]   Fax:  [phone]  Physical Therapy Treatment  Patient Details  Name: Laura Hale MRN: [id number] Date of Birth: 1946-04-09 Referring Provider: Samara Deist DPM   Encounter Date: 10/09/2017  PT End of Session - 10/09/17 0905    Visit Number  8    Number of Visits  12    Date for PT Re-Evaluation  10/28/17    PT Start Time  0901    PT Stop Time  0948    PT Time Calculation (min)  47 min    Activity Tolerance  Patient tolerated treatment well;Patient limited by pain    Behavior During Therapy  [facility] for tasks assessed/performed       Past Medical History:  Diagnosis Date  . Arthritis    elbow, hands, neck   . Asthma    allergy induced asthma-only exacerbated by smells like perfume, smoke etc  . Atypical childhood psychosis 2007   myoview -negative for ischemia with preserved LV function  . Colon polyps   . Complication of anesthesia    WITH ACDF IN JUNE 2016-PT STATES SHE LOST HER VOICE X 10 WEEKS DUE TO INTUBATION  . COPD (chronic obstructive pulmonary disease) ([place])   . Edema    LEGS/FEET  . Hypercalcemia 2009   calcium  WNL 10.2 on 04-2015  . Hyperlipidemia   . Obesity   . Urinary incontinence     Past Surgical History:  Procedure Laterality Date  . ABDOMINAL HYSTERECTOMY    . ANTERIOR CERVICAL DECOMP/DISCECTOMY FUSION N/A 01/31/2015   Procedure: ANTERIOR CERVICAL DECOMPRESSION/DISCECTOMY FUSION CERVICAL FIVE SIX;  Surgeon: Karie Chimera, MD;  Location: [place] [facility];  Service: Neurosurgery;  Laterality: N/A;  . BACK SURGERY    . CATARACT EXTRACTION W/PHACO Left 02/06/2017   Procedure: CATARACT EXTRACTION PHACO AND INTRAOCULAR LENS PLACEMENT (IOC);  Surgeon: Eulogio Bear, MD;  Location: [facility];  Service: Ophthalmology;  Laterality: Left;  Lot# [id number] H [place]: 00:37.9 AP%: 7.9 CDE: 2.97  . CATARACT EXTRACTION W/PHACO  Right 03/06/2017   Procedure: CATARACT EXTRACTION PHACO AND INTRAOCULAR LENS PLACEMENT (IOC);  Surgeon: Eulogio Bear, MD;  Location: [facility];  Service: Ophthalmology;  Laterality: Right;  Lot #[id number] H [place]: 00.22.0 AP%: 7.5 CDE: 1.64   . COLONOSCOPY    . COLONOSCOPY WITH PROPOFOL N/A 09/23/2017   Procedure: COLONOSCOPY WITH PROPOFOL;  Surgeon: Toledo, Benay Pike, MD;  Location: [facility];  Service: Gastroenterology;  Laterality: N/A;  . ELBOW SURGERY Left 04/2003   x2  & for repair & then remove all hardware, due to injury from a fall  . EYE SURGERY    . FRACTURE SURGERY    . HAND SURGERY Right 2006   injury- then eventually lost remainder of thumb  . KNEE ARTHROSCOPY WITH LATERAL MENISECTOMY Left 01/23/2016   Procedure: KNEE ARTHROSCOPY WITH LATERAL MENISECTOMY;  Surgeon: Corky Mull, MD;  Location: [facility];  Service: Orthopedics;  Laterality: Left;  . ORIF HUMERUS FRACTURE Right 08/31/2015   Procedure: OPEN REDUCTION INTERNAL FIXATION (ORIF) PROXIMAL HUMERUS FRACTURE w/ #2 fberwire;  Surgeon: Corky Mull, MD;  Location: [facility];  Service: Orthopedics;  Laterality: Right;  . 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  Subjective Assessment - 10/09/17 0904    Subjective  less swollen, tender left ankle/heel today    Pertinent History  History of pain left heel/ankle x 1 year that has worsened and she is not unable to stand or walk without severe difficulty. She is limited in walking, standing activities which interferes with her daily like at home and in community.     Limitations  Standing;Walking;House hold activities;Other (comment) recreational activities, stairs    How long can you stand comfortably?  <30 min.    How long can you walk comfortably?  <60 min.    Diagnostic tests  X ray: calcaneal spur left ankle, cacification in Achilles tendon    Patient Stated Goals  Patient would like to walk, stand and return to normal activties  without pain or swelling left ankle.    Currently in Pain?  No/denies    Pain Score  --    Pain Location  --    Pain Onset  --    Pain Frequency  --       Objective: Left ankle/heel:moderate swelling left heel posterior aspect with decreased soft tissue elasticity, spasms palpable lateral and medial calf muscles  Treatment: Manual therapy: 10 min.: goal: spasms, improve soft tissue elasticity, decrease pain STM performed to left lower leg calf with focus on lateral aspect, spasms, superficial techniques; patient prone lying with pillow supporting lower legs  Modalities:  Ultrasound: 1 MHz 50% pulsed 1.2 w/cm2 x67min. to posterior aspect left ankle/ Achilles tendon with patient prone with left LE supported on pillow goal: pain,inflammation,swelling  Iontophoresis with dexamethasone 4mg /ml applied 85ma*min using medium sized electrode to posterior aspect of left foot/heel over swollen area with patientsupine lying;goal: pain, inflammation; no adverse reactions noted (application41min)  High volt estimx29min.(2) electrodes applied to left calfwith patient supine with left LE supported on pillow, intensity to tolerance: goal: pain(applied in conjunction with iontophoresis)  Patient response to treatment: improved soft tissue elasticity left heel and calf muscles up to 50% following treatment  No adverse reactions noted with treatment.    PT Education - 10/09/17 1009    Education provided  Yes    Education Details  re assessed self managment of left heel    Person(s) Educated  Patient    Methods  Explanation    Comprehension  Verbalized understanding          PT Long Term Goals - 09/16/17 0951      PT LONG TERM GOAL #1   Title  Patient will demonstrate improved function with daily tasks as indicated by LEFS score of 40/80     Baseline  LEFS 27/80    Status  New    Target Date  10/07/17      PT LONG TERM GOAL #2   Title  improved FADI score to 40% or better  indcating improved function with standing, going down steps, walking, standing activties    Baseline  FADI 56%     Status  New    Target Date  10/14/17      PT LONG TERM GOAL #3   Title  improved FADI score to 30% or less demonstrating improved function with daily tasks involving standing, walking    Baseline  FADI 56%    Status  New    Target Date  10/28/17      PT LONG TERM GOAL #4   Title  patient will be indepenent with self management of pain, home program for ROM, strength by discharge to continue progressing towards prior level of function.     Baseline  limited knowledge of appropriate pain control strategies, exercises and  progression without assistance, guidance and cuing    Status  New    Target Date  10/28/17            Plan - 10/09/17 1007    Clinical Impression Statement  Patient is responding well to iontophoresis, manual techniques and [place] and is progressing well towards goals. She continues with moderate swelling in posterior aspect of left heel and will benefit from continued physical therapy intervention to achieve goals.     Rehab Potential  Fair    Clinical Impairments Affecting Rehab Potential  (+)motivated, prior level of function (-)chronic condition, X ray report calcaneal spur with cacifications in tendon    PT Frequency  2x / week    PT Duration  6 weeks    PT Treatment/Interventions  Neuromuscular re-education;Manual techniques;Ultrasound;Electrical Stimulation;Iontophoresis 4mg /ml Dexamethasone;Moist Heat;Therapeutic exercise;Therapeutic activities;Patient/family education    PT Next Visit Plan  pain control, iointo, [place], estim., therapeutic exercise, manual techniques    PT Home Exercise Plan  ice massage and ROM , weight shifting as able       Patient will benefit from skilled therapeutic intervention in order to improve the following deficits and impairments:  Abnormal gait, Pain, Decreased activity tolerance, Decreased range of motion, Decreased  strength, Difficulty walking, Obesity  Visit Diagnosis: Pain in left ankle and joints of left foot  Muscle weakness (generalized)  Other abnormalities of gait and mobility  Difficulty in walking, not elsewhere classified     Problem List Patient Active Problem List   Diagnosis Date Noted  . 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    Jomarie Longs PT 10/09/2017, 10:10 AM  [facility] [address], [name], [address] Phone: [phone]   Fax:  [phone]  Name: Laura Hale MRN: [id number] Date of Birth: 01-06-1946

## 2017-10-14 ENCOUNTER — Encounter: Payer: PPO | Admitting: Physical Therapy

## 2017-10-14 ENCOUNTER — Ambulatory Visit: Payer: PPO | Attending: Podiatry | Admitting: Physical Therapy

## 2017-10-14 ENCOUNTER — Encounter: Payer: Self-pay | Admitting: Physical Therapy

## 2017-10-14 DIAGNOSIS — R262 Difficulty in walking, not elsewhere classified: Secondary | ICD-10-CM

## 2017-10-14 DIAGNOSIS — M25572 Pain in left ankle and joints of left foot: Secondary | ICD-10-CM

## 2017-10-14 DIAGNOSIS — M6281 Muscle weakness (generalized): Secondary | ICD-10-CM | POA: Diagnosis not present

## 2017-10-14 DIAGNOSIS — R2689 Other abnormalities of gait and mobility: Secondary | ICD-10-CM | POA: Diagnosis not present

## 2017-10-14 NOTE — Therapy (Signed)
[facility] [address] [name], [address] Phone: [phone]   Fax:  [phone]  Physical Therapy Treatment  Patient Details  Name: LEIGHTON LUSTER MRN: [id number] Date of Birth: 09-18-1945 Referring Provider: Samara Deist DPM   Encounter Date: 10/14/2017  PT End of Session - 10/14/17 1118    Visit Number  9    Number of Visits  12    Date for PT Re-Evaluation  10/28/17    PT Start Time  1038    PT Stop Time  1130    PT Time Calculation (min)  52 min    Activity Tolerance  Patient tolerated treatment well;Patient limited by pain    Behavior During Therapy  [facility] for tasks assessed/performed       Past Medical History:  Diagnosis Date  . Arthritis    elbow, hands, neck   . Asthma    allergy induced asthma-only exacerbated by smells like perfume, smoke etc  . Atypical childhood psychosis 2007   myoview -negative for ischemia with preserved LV function  . Colon polyps   . Complication of anesthesia    WITH ACDF IN JUNE 2016-PT STATES SHE LOST HER VOICE X 10 WEEKS DUE TO INTUBATION  . COPD (chronic obstructive pulmonary disease) ([place])   . Edema    LEGS/FEET  . Hypercalcemia 2009   calcium  WNL 10.2 on 04-2015  . Hyperlipidemia   . Obesity   . Urinary incontinence     Past Surgical History:  Procedure Laterality Date  . ABDOMINAL HYSTERECTOMY    . ANTERIOR CERVICAL DECOMP/DISCECTOMY FUSION N/A 01/31/2015   Procedure: ANTERIOR CERVICAL DECOMPRESSION/DISCECTOMY FUSION CERVICAL FIVE SIX;  Surgeon: Karie Chimera, MD;  Location: [place] [facility];  Service: Neurosurgery;  Laterality: N/A;  . BACK SURGERY    . CATARACT EXTRACTION W/PHACO Left 02/06/2017   Procedure: CATARACT EXTRACTION PHACO AND INTRAOCULAR LENS PLACEMENT (IOC);  Surgeon: Eulogio Bear, MD;  Location: [facility];  Service: Ophthalmology;  Laterality: Left;  Lot# [id number] H [place]: 00:37.9 AP%: 7.9 CDE: 2.97  . CATARACT EXTRACTION W/PHACO  Right 03/06/2017   Procedure: CATARACT EXTRACTION PHACO AND INTRAOCULAR LENS PLACEMENT (IOC);  Surgeon: Eulogio Bear, MD;  Location: [facility];  Service: Ophthalmology;  Laterality: Right;  Lot #[id number] H [place]: 00.22.0 AP%: 7.5 CDE: 1.64   . COLONOSCOPY    . COLONOSCOPY WITH PROPOFOL N/A 09/23/2017   Procedure: COLONOSCOPY WITH PROPOFOL;  Surgeon: Toledo, Benay Pike, MD;  Location: [facility];  Service: Gastroenterology;  Laterality: N/A;  . ELBOW SURGERY Left 04/2003   x2  & for repair & then remove all hardware, due to injury from a fall  . EYE SURGERY    . FRACTURE SURGERY    . HAND SURGERY Right 2006   injury- then eventually lost remainder of thumb  . KNEE ARTHROSCOPY WITH LATERAL MENISECTOMY Left 01/23/2016   Procedure: KNEE ARTHROSCOPY WITH LATERAL MENISECTOMY;  Surgeon: Corky Mull, MD;  Location: [facility];  Service: Orthopedics;  Laterality: Left;  . ORIF HUMERUS FRACTURE Right 08/31/2015   Procedure: OPEN REDUCTION INTERNAL FIXATION (ORIF) PROXIMAL HUMERUS FRACTURE w/ #2 fberwire;  Surgeon: Corky Mull, MD;  Location: [facility];  Service: Orthopedics;  Laterality: Right;  . 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  Subjective Assessment - 10/14/17 1040    Subjective  Patient reports she is noticing improvement with left heel not as swollen  and less painful and tight with walking. The chair squats seem to be helping with this.     Pertinent History  History of pain left heel/ankle x 1 year that has worsened and she is not unable to stand or walk without severe difficulty. She is limited in walking, standing activities which interferes with her daily like at home and in community.     Limitations  Standing;Walking;House hold activities;Other (comment) recreational activities, stairs    How long can you stand comfortably?  <30 min.    How long can you walk comfortably?  <60 min.    Diagnostic tests  X ray: calcaneal spur left ankle,  cacification in Achilles tendon    Patient Stated Goals  Patient would like to walk, stand and return to normal activties without pain or swelling left ankle.    Currently in Pain?  Yes    Pain Score  3     Pain Location  Ankle    Pain Orientation  Left    Pain Descriptors / Indicators  Tightness    Pain Type  Chronic pain    Pain Onset  More than a month ago    Pain Frequency  Intermittent       Objective: Left ankle/heel:moderate swelling left heel posterior aspect with decreased soft tissue elasticity, spasms palpable lateral calf muscles Gait pattern mildly antalgic  Treatment: Manual therapy: 10 min.: goal: spasms, improve soft tissue elasticity, decrease pain STM performed to left lower leg calf with focus on lateral aspect, spasms, superficial techniques; patient prone lying with pillow supporting lower legs  Modalities:  Ultrasound: 1 MHz 50% pulsed 1.2 w/cm2 x51min. to posterior aspect left ankle/ Achilles tendon with patient prone with left LE supported on pillow goal: pain,inflammation,swelling  Iontophoresis with dexamethasone 4mg /ml applied 80ma*min using medium sized electrode to posterior aspect of left foot/heel over swollen area with patientsupine lying;goal: pain, inflammation; no adverse reactions noted (application33min)  High volt estimx41min.(2) electrodes applied to left calfwith patient supine with left LE supported on pillow, intensity to tolerance: goal: pain(applied in conjunction with iontophoresis)  Patient response to treatment: decreased swelling, pain to mild/non tender to palpation. Improved soft tissue elasticity up to 50%    PT Education - 10/14/17 1118    Education provided  Yes    Education Details  re asssessed HEP    Person(s) Educated  Patient    Methods  Explanation    Comprehension  Verbalized understanding          PT Long Term Goals - 09/16/17 0951      PT LONG TERM GOAL #1   Title  Patient will  demonstrate improved function with daily tasks as indicated by LEFS score of 40/80     Baseline  LEFS 27/80    Status  New    Target Date  10/07/17      PT LONG TERM GOAL #2   Title  improved FADI score to 40% or better indcating improved function with standing, going down steps, walking, standing activties    Baseline  FADI 56%     Status  New    Target Date  10/14/17      PT LONG TERM GOAL #3   Title  improved FADI score to 30% or less demonstrating improved function with daily tasks involving standing, walking    Baseline  FADI 56%    Status  New    Target Date  10/28/17      PT LONG TERM GOAL #  4   Title  patient will be indepenent with self management of pain, home program for ROM, strength by discharge to continue progressing towards prior level of function.     Baseline  limited knowledge of appropriate pain control strategies, exercises and progression without assistance, guidance and cuing    Status  New    Target Date  10/28/17            Plan - 10/14/17 1134    Clinical Impression Statement  Patient is progressing well with goals with decreasing swelling and pain and improvement noted with walking.     Rehab Potential  Fair    Clinical Impairments Affecting Rehab Potential  (+)motivated, prior level of function (-)chronic condition, X ray report calcaneal spur with cacifications in tendon    PT Frequency  2x / week    PT Duration  6 weeks    PT Treatment/Interventions  Neuromuscular re-education;Manual techniques;Ultrasound;Electrical Stimulation;Iontophoresis 4mg /ml Dexamethasone;Moist Heat;Therapeutic exercise;Therapeutic activities;Patient/family education    PT Next Visit Plan  pain control, iointo, [place], estim., therapeutic exercise, manual techniques, iontophoresis    PT Home Exercise Plan  ice massage and ROM , weight shifting as able       Patient will benefit from skilled therapeutic intervention in order to improve the following deficits and impairments:   Abnormal gait, Pain, Decreased activity tolerance, Decreased range of motion, Decreased strength, Difficulty walking, Obesity  Visit Diagnosis: Pain in left ankle and joints of left foot  Muscle weakness (generalized)  Other abnormalities of gait and mobility  Difficulty in walking, not elsewhere classified     Problem List Patient Active Problem List   Diagnosis Date Noted  . 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    Jomarie Longs PT 10/15/2017, 9:32 AM  [facility] [address], [name], [address] Phone: [phone]   Fax:  [phone]  Name: SHARILYN GEISINGER MRN: [id number] Date of Birth: July 30, 1946

## 2017-10-16 ENCOUNTER — Ambulatory Visit: Payer: PPO | Admitting: Physical Therapy

## 2017-10-17 DIAGNOSIS — G4733 Obstructive sleep apnea (adult) (pediatric): Secondary | ICD-10-CM | POA: Diagnosis not present

## 2017-10-21 ENCOUNTER — Ambulatory Visit: Payer: PPO | Admitting: Physical Therapy

## 2017-10-23 ENCOUNTER — Ambulatory Visit: Payer: PPO | Admitting: Physical Therapy

## 2017-10-28 ENCOUNTER — Encounter: Payer: Self-pay | Admitting: Physical Therapy

## 2017-10-28 ENCOUNTER — Ambulatory Visit: Payer: PPO | Admitting: Physical Therapy

## 2017-10-28 DIAGNOSIS — I8393 Asymptomatic varicose veins of bilateral lower extremities: Secondary | ICD-10-CM | POA: Diagnosis not present

## 2017-10-28 DIAGNOSIS — L918 Other hypertrophic disorders of the skin: Secondary | ICD-10-CM | POA: Diagnosis not present

## 2017-10-28 DIAGNOSIS — M25572 Pain in left ankle and joints of left foot: Secondary | ICD-10-CM

## 2017-10-28 DIAGNOSIS — Z85828 Personal history of other malignant neoplasm of skin: Secondary | ICD-10-CM | POA: Diagnosis not present

## 2017-10-28 DIAGNOSIS — L821 Other seborrheic keratosis: Secondary | ICD-10-CM | POA: Diagnosis not present

## 2017-10-28 DIAGNOSIS — R262 Difficulty in walking, not elsewhere classified: Secondary | ICD-10-CM

## 2017-10-28 DIAGNOSIS — D229 Melanocytic nevi, unspecified: Secondary | ICD-10-CM | POA: Diagnosis not present

## 2017-10-28 DIAGNOSIS — L718 Other rosacea: Secondary | ICD-10-CM | POA: Diagnosis not present

## 2017-10-28 DIAGNOSIS — L720 Epidermal cyst: Secondary | ICD-10-CM | POA: Diagnosis not present

## 2017-10-28 DIAGNOSIS — L219 Seborrheic dermatitis, unspecified: Secondary | ICD-10-CM | POA: Diagnosis not present

## 2017-10-28 DIAGNOSIS — M6281 Muscle weakness (generalized): Secondary | ICD-10-CM

## 2017-10-28 DIAGNOSIS — Z1283 Encounter for screening for malignant neoplasm of skin: Secondary | ICD-10-CM | POA: Diagnosis not present

## 2017-10-28 DIAGNOSIS — D1800 Hemangioma unspecified site: Secondary | ICD-10-CM | POA: Diagnosis not present

## 2017-10-28 DIAGNOSIS — I788 Other diseases of capillaries: Secondary | ICD-10-CM | POA: Diagnosis not present

## 2017-10-28 DIAGNOSIS — L820 Inflamed seborrheic keratosis: Secondary | ICD-10-CM | POA: Diagnosis not present

## 2017-10-28 DIAGNOSIS — R2689 Other abnormalities of gait and mobility: Secondary | ICD-10-CM

## 2017-10-28 NOTE — Therapy (Signed)
[facility] [address] [name], [address] Phone: [phone]   Fax:  [phone]  Physical Therapy Treatment  Patient Details  Name: Laura Hale MRN: [id number] Date of Birth: 1946/07/31 Referring Provider: Samara Deist DPM   Encounter Date: 10/28/2017  PT End of Session - 10/28/17 1039    Visit Number  10    Number of Visits  12    Date for PT Re-Evaluation  10/28/17    PT Start Time  0856    PT Stop Time  0955    PT Time Calculation (min)  59 min    Activity Tolerance  Patient tolerated treatment well;Patient limited by pain    Behavior During Therapy  [facility] for tasks assessed/performed       Past Medical History:  Diagnosis Date  . Arthritis    elbow, hands, neck   . Asthma    allergy induced asthma-only exacerbated by smells like perfume, smoke etc  . Atypical childhood psychosis 2007   myoview -negative for ischemia with preserved LV function  . Colon polyps   . Complication of anesthesia    WITH ACDF IN JUNE 2016-PT STATES SHE LOST HER VOICE X 10 WEEKS DUE TO INTUBATION  . COPD (chronic obstructive pulmonary disease) ([place])   . Edema    LEGS/FEET  . Hypercalcemia 2009   calcium  WNL 10.2 on 04-2015  . Hyperlipidemia   . Obesity   . Urinary incontinence     Past Surgical History:  Procedure Laterality Date  . ABDOMINAL HYSTERECTOMY    . ANTERIOR CERVICAL DECOMP/DISCECTOMY FUSION N/A 01/31/2015   Procedure: ANTERIOR CERVICAL DECOMPRESSION/DISCECTOMY FUSION CERVICAL FIVE SIX;  Surgeon: Karie Chimera, MD;  Location: [place] [facility];  Service: Neurosurgery;  Laterality: N/A;  . BACK SURGERY    . CATARACT EXTRACTION W/PHACO Left 02/06/2017   Procedure: CATARACT EXTRACTION PHACO AND INTRAOCULAR LENS PLACEMENT (IOC);  Surgeon: Eulogio Bear, MD;  Location: [facility];  Service: Ophthalmology;  Laterality: Left;  Lot# [id number] [place]: 00:37.9 AP%: 7.9 CDE: 2.97  . CATARACT EXTRACTION W/PHACO  Right 03/06/2017   Procedure: CATARACT EXTRACTION PHACO AND INTRAOCULAR LENS PLACEMENT (IOC);  Surgeon: Eulogio Bear, MD;  Location: [facility];  Service: Ophthalmology;  Laterality: Right;  Lot #[id number] H [place]: 00.22.0 AP%: 7.5 CDE: 1.64   . COLONOSCOPY    . COLONOSCOPY WITH PROPOFOL N/A 09/23/2017   Procedure: COLONOSCOPY WITH PROPOFOL;  Surgeon: Toledo, Benay Pike, MD;  Location: [facility];  Service: Gastroenterology;  Laterality: N/A;  . ELBOW SURGERY Left 04/2003   x2  & for repair & then remove all hardware, due to injury from a fall  . EYE SURGERY    . FRACTURE SURGERY    . HAND SURGERY Right 2006   injury- then eventually lost remainder of thumb  . KNEE ARTHROSCOPY WITH LATERAL MENISECTOMY Left 01/23/2016   Procedure: KNEE ARTHROSCOPY WITH LATERAL MENISECTOMY;  Surgeon: Corky Mull, MD;  Location: [facility];  Service: Orthopedics;  Laterality: Left;  . ORIF HUMERUS FRACTURE Right 08/31/2015   Procedure: OPEN REDUCTION INTERNAL FIXATION (ORIF) PROXIMAL HUMERUS FRACTURE w/ #2 fberwire;  Surgeon: Corky Mull, MD;  Location: [facility];  Service: Orthopedics;  Laterality: Right;  . 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  Subjective Assessment - 10/28/17 0856    Subjective  Patient reports she is improving and now able to walk some with  shoes with backs. Her pain overall is doing better and today is ~1/10    Pertinent History  History of pain left heel/ankle x 1 year that has worsened and she is not unable to stand or walk without severe difficulty. She is limited in walking, standing activities which interferes with her daily like at home and in community.     Limitations  Standing;Walking;House hold activities;Other (comment) recreational activities, stairs    How long can you stand comfortably?  <30 min.    How long can you walk comfortably?  <60 min.    Diagnostic tests  X ray: calcaneal spur left ankle, cacification in Achilles tendon     Patient Stated Goals  Patient would like to walk, stand and return to normal activties without pain or swelling left ankle.    Currently in Pain?  Yes    Pain Score  1     Pain Location  Ankle    Pain Orientation  Left    Pain Descriptors / Indicators  Tightness    Pain Type  Chronic pain    Pain Onset  More than a month ago    Pain Frequency  Intermittent          Objective: Left ankle/heel: moderate swelling left heel posterior aspect with decreased soft tissue elasticity, spasms palpable lateral calf muscles Gait pattern mildly antalgic   Treatment:  Manual therapy: 10 min.: goal: spasms, improve soft tissue elasticity, decrease pain STM performed to left lower leg calf with focus on lateral aspect, spasms, superficial techniques; patient prone lying with pillow supporting lower legs   Modalities:  Ultrasound:  1 MHz 50% pulsed 1.2 w/cm2 x 10 min. to posterior aspect left ankle/ Achilles tendon with patient prone with left LE supported on pillow goal: pain, inflammation, swelling   Iontophoresis with dexamethasone 4mg /ml applied 47ma*min using medium sized electrode to posterior aspect of left foot/heel over swollen area with patient supine lying; goal: pain, inflammation; no adverse reactions noted (application 15 min)   High volt estim x 20 min. (2) electrodes applied to left calf with patient supine with left LE supported on pillow, intensity to tolerance: goal: pain(applied in conjunction with iontophoresis)    Patient response to treatment: no adverse reaction to treatment. Improved soft tissue elasticity with decreased spasm 50% left calf muscles       PT Education - 10/28/17 1039    Education provided  Yes    Education Details  progress, HEP    Person(s) Educated  Patient    Methods  Explanation    Comprehension  Verbalized understanding          PT Long Term Goals - 09/16/17 0951      PT LONG TERM GOAL #1   Title  Patient will demonstrate improved function  with daily tasks as indicated by LEFS score of 40/80     Baseline  LEFS 27/80    Status  New    Target Date  10/07/17      PT LONG TERM GOAL #2   Title  improved FADI score to 40% or better indcating improved function with standing, going down steps, walking, standing activties    Baseline  FADI 56%     Status  New    Target Date  10/14/17      PT LONG TERM GOAL #3   Title  improved FADI score to 30% or less demonstrating improved function with daily tasks involving standing, walking    Baseline  FADI 56%    Status  New    Target Date  10/28/17      PT LONG TERM GOAL #4   Title  patient will be indepenent with self management of pain, home program for ROM, strength by discharge to continue progressing towards prior level of function.     Baseline  limited knowledge of appropriate pain control strategies, exercises and progression without assistance, guidance and cuing    Status  New    Target Date  10/28/17            Plan - 10/28/17 1040    Clinical Impression Statement  Patient is progressing steadily towards goals with decreasing swelling and able to wear shoes with backs for short periods of time.  She is responding well to current treatment and should continue to progress with additional physical therapy intervention.    Rehab Potential  Fair    Clinical Impairments Affecting Rehab Potential  (+)motivated, prior level of function (-)chronic condition, X ray report calcaneal spur with cacifications in tendon    PT Frequency  2x / week    PT Duration  6 weeks    PT Treatment/Interventions  Neuromuscular re-education;Manual techniques;Ultrasound;Electrical Stimulation;Iontophoresis 4mg /ml Dexamethasone;Moist Heat;Therapeutic exercise;Therapeutic activities;Patient/family education    PT Next Visit Plan  pain control, iointo, [place], estim., therapeutic exercise, manual techniques, iontophoresis    PT Home Exercise Plan  ice massage and ROM , weight shifting as able        Patient will benefit from skilled therapeutic intervention in order to improve the following deficits and impairments:  Abnormal gait, Pain, Decreased activity tolerance, Decreased range of motion, Decreased strength, Difficulty walking, Obesity  Visit Diagnosis: Pain in left ankle and joints of left foot  Muscle weakness (generalized)  Other abnormalities of gait and mobility  Difficulty in walking, not elsewhere classified     Problem List Patient Active Problem List   Diagnosis Date Noted  . 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    Jomarie Longs PT 10/28/2017, 10:42 AM  [place] [facility] [address], [name], [address] Phone: [phone]   Fax:  [phone]  Name: Laura Hale MRN: [id number] Date of Birth: Mar 10, 1946

## 2017-10-30 ENCOUNTER — Encounter: Payer: Self-pay | Admitting: Physical Therapy

## 2017-10-30 ENCOUNTER — Ambulatory Visit: Payer: PPO | Admitting: Physical Therapy

## 2017-10-30 DIAGNOSIS — M6281 Muscle weakness (generalized): Secondary | ICD-10-CM

## 2017-10-30 DIAGNOSIS — R262 Difficulty in walking, not elsewhere classified: Secondary | ICD-10-CM

## 2017-10-30 DIAGNOSIS — R2689 Other abnormalities of gait and mobility: Secondary | ICD-10-CM

## 2017-10-30 DIAGNOSIS — M25572 Pain in left ankle and joints of left foot: Secondary | ICD-10-CM

## 2017-10-30 NOTE — Therapy (Signed)
[place] [address], [name], [address] Phone: [phone]   Fax:  [phone]  Physical Therapy Treatment  Patient Details  Name: Laura Hale MRN: [id number] Date of Birth: July 22, 1946 Referring Provider: Samara Deist DPM   Encounter Date: 10/30/2017  PT End of Session - 10/30/17 0938    Visit Number  11    Number of Visits  24    Date for PT Re-Evaluation  12/11/17    PT Start Time  0900    PT Stop Time  0955    PT Time Calculation (min)  55 min    Activity Tolerance  Patient tolerated treatment well;Patient limited by pain    Behavior During Therapy  [facility] for tasks assessed/performed       Past Medical History:  Diagnosis Date  . Arthritis    elbow, hands, neck   . Asthma    allergy induced asthma-only exacerbated by smells like perfume, smoke etc  . Atypical childhood psychosis 2007   myoview -negative for ischemia with preserved LV function  . Colon polyps   . Complication of anesthesia    WITH ACDF IN JUNE 2016-PT STATES SHE LOST HER VOICE X 10 WEEKS DUE TO INTUBATION  . COPD (chronic obstructive pulmonary disease) ([place])   . Edema    LEGS/FEET  . Hypercalcemia 2009   calcium  WNL 10.2 on 04-2015  . Hyperlipidemia   . Obesity   . Urinary incontinence     Past Surgical History:  Procedure Laterality Date  . ABDOMINAL HYSTERECTOMY    . ANTERIOR CERVICAL DECOMP/DISCECTOMY FUSION N/A 01/31/2015   Procedure: ANTERIOR CERVICAL DECOMPRESSION/DISCECTOMY FUSION CERVICAL FIVE SIX;  Surgeon: Karie Chimera, MD;  Location: [place] [facility];  Service: Neurosurgery;  Laterality: N/A;  . BACK SURGERY    . CATARACT EXTRACTION W/PHACO Left 02/06/2017   Procedure: CATARACT EXTRACTION PHACO AND INTRAOCULAR LENS PLACEMENT (IOC);  Surgeon: Eulogio Bear, MD;  Location: [facility];  Service: Ophthalmology;  Laterality: Left;  Lot# [id number] H [place]: 00:37.9 AP%: 7.9 CDE: 2.97  . CATARACT EXTRACTION W/PHACO  Right 03/06/2017   Procedure: CATARACT EXTRACTION PHACO AND INTRAOCULAR LENS PLACEMENT (IOC);  Surgeon: Eulogio Bear, MD;  Location: [facility];  Service: Ophthalmology;  Laterality: Right;  Lot #[id number] H [place]: 00.22.0 AP%: 7.5 CDE: 1.64   . COLONOSCOPY    . COLONOSCOPY WITH PROPOFOL N/A 09/23/2017   Procedure: COLONOSCOPY WITH PROPOFOL;  Surgeon: Toledo, Benay Pike, MD;  Location: [facility];  Service: Gastroenterology;  Laterality: N/A;  . ELBOW SURGERY Left 04/2003   x2  & for repair & then remove all hardware, due to injury from a fall  . EYE SURGERY    . FRACTURE SURGERY    . HAND SURGERY Right 2006   injury- then eventually lost remainder of thumb  . KNEE ARTHROSCOPY WITH LATERAL MENISECTOMY Left 01/23/2016   Procedure: KNEE ARTHROSCOPY WITH LATERAL MENISECTOMY;  Surgeon: Corky Mull, MD;  Location: [facility];  Service: Orthopedics;  Laterality: Left;  . ORIF HUMERUS FRACTURE Right 08/31/2015   Procedure: OPEN REDUCTION INTERNAL FIXATION (ORIF) PROXIMAL HUMERUS FRACTURE w/ #2 fberwire;  Surgeon: Corky Mull, MD;  Location: [facility];  Service: Orthopedics;  Laterality: Right;  . 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  Subjective Assessment - 10/30/17 0936    Subjective  Patient reports increased pain in left heel today for no apparent reason.  She has been putting ice on it to help with the pain. She is going to have a busy day today and would like to feel better.     Pertinent History  History of pain left heel/ankle x 1 year that has worsened and she is not unable to stand or walk without severe difficulty. She is limited in walking, standing activities which interferes with her daily like at home and in community.     Limitations  Standing;Walking;House hold activities;Other (comment) recreational activities, stairs    How long can you stand comfortably?  <30 min.    How long can you walk comfortably?  <60 min.    Diagnostic tests  X ray:  calcaneal spur left ankle, cacification in Achilles tendon    Patient Stated Goals  Patient would like to walk, stand and return to normal activties without pain or swelling left ankle.    Currently in Pain?  Yes    Pain Score  4     Pain Location  Ankle    Pain Orientation  Left;Medial    Pain Descriptors / Indicators  Aching;Burning    Pain Type  Acute pain;Chronic pain    Pain Onset  More than a month ago    Pain Frequency  Intermittent         Objective: Left ankle/heel: moderate swelling left heel posterior aspect with decreased soft tissue elasticity, spasms palpable lateral calf muscles Gait pattern mildly antalgic   Treatment:  Manual therapy: 10 min.: goal: spasms, improve soft tissue elasticity, decrease pain STM performed to left lower leg calf with focus on medial aspect, spasms, superficial techniques; patient prone lying with pillow supporting lower legs   Modalities:  Ultrasound:  1 MHz 50% pulsed 1.2 w/cm2 x 10 min. to posterior aspect left ankle/ Achilles tendon with patient prone with left LE supported on pillow goal: pain, inflammation, swelling   Iontophoresis with dexamethasone 74m/ml applied 417mmin using medium sized electrode to posterior aspect of left foot/heel over swollen area (more medial ) with patient supine lying; goal: pain, inflammation; no adverse reactions noted (application 8 min)   High volt estim x 20 min. (2) electrodes applied to left calf with patient supine with left LE supported on pillow, intensity to tolerance: goal: pain(applied in conjunction with iontophoresis)    Patient response to treatment: no adverse reactions to treatment. improved soft tissue elasticity left calf medial/lateral aspect with decreased spasms by 50% following STM. good understanding of home program.    [facility] - 10/30/17 1005      Assessment   Medical Diagnosis  Achilles tendinitis left LE    Referring Provider  FoSamara DeistPM    Onset  Date/Surgical Date  08/12/16      Observation/Other Assessments   Other Surveys   Other Surveys FADI 52%     Lower Extremity Functional Scale   30/80 (62% self perceived impairment)      AROM   Overall AROM Comments  left ankle DF: 10 PF: 30: inversion: 25 eversion: 10  Right ankle DF: 10; PF: 45; inversion: 20; eversion 15 All other joints and ROM WNL both LE's       Strength   Overall Strength Comments  bilateral LE's grossly major muscles groups WNL           PT Education - 10/30/17 0938    Education provided  Yes    Education Details  HEP self management: use of ice today and gentle stretching: active  DF     Person(s) Educated  Patient    Methods  Explanation;Demonstration;Verbal cues    Comprehension  Verbalized understanding;Returned demonstration;Verbal cues required          PT Long Term Goals - 10/30/17 1001      PT LONG TERM GOAL #1   Title  Patient will demonstrate improved function with daily tasks as indicated by LEFS score of 40/80     Baseline  LEFS 27/80; LEFS 10/30/17 30/80    Status  Partially Met      PT LONG TERM GOAL #2   Title  improved FADI score to 40% or better indcating improved function with standing, going down steps, walking, standing activties    Baseline  FADI 56% :FADI 10/30/16 52%    Status  Not Met    Target Date  12/11/17      PT LONG TERM GOAL #3   Title  improved FADI score to 30% or less demonstrating improved function with daily tasks involving standing, walking    Baseline  FADI 56%    Status  Revised    Target Date  12/11/17      PT LONG TERM GOAL #4   Title  patient will be indepenent with self management of pain, home program for ROM, strength by discharge to continue progressing towards prior level of function.     Baseline  limited knowledge of appropriate pain control strategies, exercises and progression without assistance, guidance and cuing    Status  On-going    Target Date  12/11/17            Plan -  10/30/17 0939    Clinical Impression Statement  Patient is progressing steadily with exacerbation of symptoms and more localized than at beginning of therapy. She is progressing slowly due to extended absence over ther past 2 weeks due to illness. She is responding well to iontophoresis and US/STM and will begin gentle stretching 2x/day.  She will benefit from additional physical therapy intervention to address limitations and improve function.     Rehab Potential  Fair    Clinical Impairments Affecting Rehab Potential  (+)motivated, prior level of function (-)chronic condition, X ray report calcaneal spur with cacifications in tendon    PT Frequency  2x / week    PT Duration  6 weeks    PT Treatment/Interventions  Neuromuscular re-education;Manual techniques;Ultrasound;Electrical Stimulation;Iontophoresis 67m/ml Dexamethasone;Moist Heat;Therapeutic exercise;Therapeutic activities;Patient/family education    PT Next Visit Plan  pain control, iointo, [place] estim., therapeutic exercise, manual techniques, iontophoresis    PT Home Exercise Plan  ice massage and ROM , weight shifting as able       Patient will benefit from skilled therapeutic intervention in order to improve the following deficits and impairments:  Abnormal gait, Pain, Decreased activity tolerance, Decreased range of motion, Decreased strength, Difficulty walking, Obesity  Visit Diagnosis: Pain in left ankle and joints of left foot - Plan: PT plan of care cert/re-cert  Muscle weakness (generalized) - Plan: PT plan of care cert/re-cert  Other abnormalities of gait and mobility - Plan: PT plan of care cert/re-cert  Difficulty in walking, not elsewhere classified - Plan: PT plan of care cert/re-cert     Problem List Patient Active Problem List   Diagnosis Date Noted  . 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    BJomarie LongsPT 10/30/2017, 10:12 AM  [facility] [address] [place] [address]Phone: [phone]  Fax:  [phone]  Name: Laura Hale MRN: [id number] Date of Birth: 07-18-1946

## 2017-11-03 ENCOUNTER — Ambulatory Visit: Payer: PPO | Admitting: Physical Therapy

## 2017-11-03 ENCOUNTER — Encounter: Payer: Self-pay | Admitting: Physical Therapy

## 2017-11-03 DIAGNOSIS — M6281 Muscle weakness (generalized): Secondary | ICD-10-CM

## 2017-11-03 DIAGNOSIS — M25572 Pain in left ankle and joints of left foot: Secondary | ICD-10-CM

## 2017-11-03 DIAGNOSIS — R2689 Other abnormalities of gait and mobility: Secondary | ICD-10-CM

## 2017-11-03 DIAGNOSIS — R262 Difficulty in walking, not elsewhere classified: Secondary | ICD-10-CM

## 2017-11-03 NOTE — Therapy (Signed)
[facility] [address] [name], [address] Phone: [phone]   Fax:  [phone]  Physical Therapy Treatment  Patient Details  Name: Laura Hale MRN: [id number] Date of Birth: 1945-09-16 Referring Provider: Samara Deist DPM   Encounter Date: 11/03/2017  PT End of Session - 11/03/17 1447    Visit Number  12    Number of Visits  24    Date for PT Re-Evaluation  12/11/17    PT Start Time  1346    PT Stop Time  1446    PT Time Calculation (min)  60 min    Activity Tolerance  Patient tolerated treatment well;Patient limited by pain    Behavior During Therapy  [facility] for tasks assessed/performed       Past Medical History:  Diagnosis Date  . Arthritis    elbow, hands, neck   . Asthma    allergy induced asthma-only exacerbated by smells like perfume, smoke etc  . Atypical childhood psychosis 2007   myoview -negative for ischemia with preserved LV function  . Colon polyps   . Complication of anesthesia    WITH ACDF IN JUNE 2016-PT STATES SHE LOST HER VOICE X 10 WEEKS DUE TO INTUBATION  . COPD (chronic obstructive pulmonary disease) ([place])   . Edema    LEGS/FEET  . Hypercalcemia 2009   calcium  WNL 10.2 on 04-2015  . Hyperlipidemia   . Obesity   . Urinary incontinence     Past Surgical History:  Procedure Laterality Date  . ABDOMINAL HYSTERECTOMY    . ANTERIOR CERVICAL DECOMP/DISCECTOMY FUSION N/A 01/31/2015   Procedure: ANTERIOR CERVICAL DECOMPRESSION/DISCECTOMY FUSION CERVICAL FIVE SIX;  Surgeon: Karie Chimera, MD;  Location: [place] [facility];  Service: Neurosurgery;  Laterality: N/A;  . BACK SURGERY    . CATARACT EXTRACTION W/PHACO Left 02/06/2017   Procedure: CATARACT EXTRACTION PHACO AND INTRAOCULAR LENS PLACEMENT (IOC);  Surgeon: Eulogio Bear, MD;  Location: [facility] ORS;  Service: Ophthalmology;  Laterality: Left;  Lot# [id number] [place]: 00:37.9 AP%: 7.9 CDE: 2.97  . CATARACT EXTRACTION W/PHACO  Right 03/06/2017   Procedure: CATARACT EXTRACTION PHACO AND INTRAOCULAR LENS PLACEMENT (IOC);  Surgeon: Eulogio Bear, MD;  Location: [facility] ORS;  Service: Ophthalmology;  Laterality: Right;  Lot #[id number] H [place]: 00.22.0 AP%: 7.5 CDE: 1.64   . COLONOSCOPY    . COLONOSCOPY WITH PROPOFOL N/A 09/23/2017   Procedure: COLONOSCOPY WITH PROPOFOL;  Surgeon: Toledo, Benay Pike, MD;  Location: [facility] ENDOSCOPY;  Service: Gastroenterology;  Laterality: N/A;  . ELBOW SURGERY Left 04/2003   x2  & for repair & then remove all hardware, due to injury from a fall  . EYE SURGERY    . FRACTURE SURGERY    . HAND SURGERY Right 2006   injury- then eventually lost remainder of thumb  . KNEE ARTHROSCOPY WITH LATERAL MENISECTOMY Left 01/23/2016   Procedure: KNEE ARTHROSCOPY WITH LATERAL MENISECTOMY;  Surgeon: Corky Mull, MD;  Location: [facility] ORS;  Service: Orthopedics;  Laterality: Left;  . ORIF HUMERUS FRACTURE Right 08/31/2015   Procedure: OPEN REDUCTION INTERNAL FIXATION (ORIF) PROXIMAL HUMERUS FRACTURE w/ #2 fberwire;  Surgeon: Corky Mull, MD;  Location: [facility] ORS;  Service: Orthopedics;  Laterality: Right;  . 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  Subjective Assessment - 11/03/17 1346    Subjective  Patient reports decreased heel pain today and able to wear closed heel  shoe over the weekend with prolonged standing: increased pain with standing 3-4 hours.     Pertinent History  History of pain left heel/ankle x 1 year that has worsened and she is not unable to stand or walk without severe difficulty. She is limited in walking, standing activities which interferes with her daily like at home and in community.     Limitations  Standing;Walking;House hold activities;Other (comment) recreational activities, stairs    How long can you stand comfortably?  <30 min.    How long can you walk comfortably?  <60 min.    Diagnostic tests  X ray: calcaneal spur left ankle,  cacification in Achilles tendon    Patient Stated Goals  Patient would like to walk, stand and return to normal activties without pain or swelling left ankle.    Currently in Pain?  Yes    Pain Score  3     Pain Location  Heel    Pain Orientation  Left;Medial    Pain Descriptors / Indicators  Aching    Pain Type  Chronic pain    Pain Onset  More than a month ago    Pain Frequency  Intermittent         Objective: Left ankle/heel:mild swelling and warmth palpable  Treatment: Manual therapy: 8 min.: goal: spasms, improve soft tissue elasticity, decrease pain STM performed to left lower leg calf with focus on lateral aspect, spasms, superficial techniques; patient prone lying with pillow supporting lower legs  Modalities:  Ultrasound: 1 MHz 50% pulsed 1.2 w/cm2 x57mn. to posterior aspect left ankle/ Achilles tendon with patient prone with left LE supported on pillow goal: pain,inflammation,swelling  Iontophoresis with dexamethasone 470mml applied 4065min using medium sized electrode to posterior aspect of left foot/heel over swollen area (more medial ) with patientsupine lying;goal: pain, inflammation; no adverse reactions noted (application15 min due to not able to get good contact with electrode) (20 minutes following application)  High volt estimx20m71m2) electrodes applied to left calfwith patient supine with left LE supported on pillow, intensity to tolerance: goal: pain  Patient response to treatment:no adverse reactions noted following modalities. Improved soft tissue elasticity with decreased spasms to mild following STM. Patient reported improved weight bearing with walking with mild discomfort at end of session; < 3/10     PT Education - 11/03/17 1747    Education provided  Yes    Education Details  HEP: light gentle stretches, using bike at local fitness facility    Person(s) Educated  Patient    Methods  Explanation    Comprehension  Verbalized  understanding          PT Long Term Goals - 10/30/17 1001      PT LONG TERM GOAL #1   Title  Patient will demonstrate improved function with daily tasks as indicated by LEFS score of 40/80     Baseline  LEFS 27/80; LEFS 10/30/17 30/80    Status  Partially Met      PT LONG TERM GOAL #2   Title  improved FADI score to 40% or better indcating improved function with standing, going down steps, walking, standing activties    Baseline  FADI 56% :FADI 10/30/16 52%    Status  Not Met    Target Date  12/11/17      PT LONG TERM GOAL #3   Title  improved FADI score to 30% or less demonstrating improved function with daily tasks involving standing, walking    Baseline  FADI  56%    Status  Revised    Target Date  12/11/17      PT LONG TERM GOAL #4   Title  patient will be indepenent with self management of pain, home program for ROM, strength by discharge to continue progressing towards prior level of function.     Baseline  limited knowledge of appropriate pain control strategies, exercises and progression without assistance, guidance and cuing    Status  On-going    Target Date  12/11/17            Plan - 11/03/17 1447    Clinical Impression Statement  Patient with decreased warmth in left heel today and was able to wear closed heel shoes for 4+ hours with minor discomfort. She is progressing steadily with current treatment.     Rehab Potential  Fair    Clinical Impairments Affecting Rehab Potential  (+)motivated, prior level of function (-)chronic condition, X ray report calcaneal spur with cacifications in tendon    PT Frequency  2x / week    PT Duration  6 weeks    PT Treatment/Interventions  Neuromuscular re-education;Manual techniques;Ultrasound;Electrical Stimulation;Iontophoresis 89m/ml Dexamethasone;Moist Heat;Therapeutic exercise;Therapeutic activities;Patient/family education    PT Next Visit Plan  pain control, iointo, [place] estim., therapeutic exercise, manual techniques,  iontophoresis    PT Home Exercise Plan  ice massage and ROM , weight shifting as able       Patient will benefit from skilled therapeutic intervention in order to improve the following deficits and impairments:  Abnormal gait, Pain, Decreased activity tolerance, Decreased range of motion, Decreased strength, Difficulty walking, Obesity  Visit Diagnosis: Pain in left ankle and joints of left foot  Muscle weakness (generalized)  Other abnormalities of gait and mobility  Difficulty in walking, not elsewhere classified     Problem List Patient Active Problem List   Diagnosis Date Noted  . 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    BJomarie LongsPT 11/04/2017, 5:50 PM  [facility] [address] [place] [address]Phone: [phone]  Fax:  [phone] Name: LSPENSER CONGMRN: [id number] of Birth: 409/01/1946

## 2017-11-05 ENCOUNTER — Ambulatory Visit: Payer: PPO | Admitting: Physical Therapy

## 2017-11-05 ENCOUNTER — Encounter: Payer: Self-pay | Admitting: Physical Therapy

## 2017-11-05 DIAGNOSIS — R262 Difficulty in walking, not elsewhere classified: Secondary | ICD-10-CM

## 2017-11-05 DIAGNOSIS — M25572 Pain in left ankle and joints of left foot: Secondary | ICD-10-CM | POA: Diagnosis not present

## 2017-11-05 DIAGNOSIS — R2689 Other abnormalities of gait and mobility: Secondary | ICD-10-CM

## 2017-11-05 DIAGNOSIS — M6281 Muscle weakness (generalized): Secondary | ICD-10-CM

## 2017-11-05 NOTE — Therapy (Signed)
[facility] [address] [name], [address] Phone: [phone]   Fax:  [phone]  Physical Therapy Treatment  Patient Details  Name: Laura Hale MRN: [id number] Date of Birth: Jan 24, 1946 Referring Provider: Samara Deist DPM   Encounter Date: 11/05/2017  PT End of Session - 11/05/17 0745    Visit Number  13    Number of Visits  24    Date for PT Re-Evaluation  12/11/17    PT Start Time  0743    PT Stop Time  0828    PT Time Calculation (min)  45 min    Activity Tolerance  Patient tolerated treatment well;Patient limited by pain    Behavior During Therapy  [facility] for tasks assessed/performed       Past Medical History:  Diagnosis Date  . Arthritis    elbow, hands, neck   . Asthma    allergy induced asthma-only exacerbated by smells like perfume, smoke etc  . Atypical childhood psychosis 2007   myoview -negative for ischemia with preserved LV function  . Colon polyps   . Complication of anesthesia    WITH ACDF IN JUNE 2016-PT STATES SHE LOST HER VOICE X 10 WEEKS DUE TO INTUBATION  . COPD (chronic obstructive pulmonary disease) ([place])   . Edema    LEGS/FEET  . Hypercalcemia 2009   calcium  WNL 10.2 on 04-2015  . Hyperlipidemia   . Obesity   . Urinary incontinence     Past Surgical History:  Procedure Laterality Date  . ABDOMINAL HYSTERECTOMY    . ANTERIOR CERVICAL DECOMP/DISCECTOMY FUSION N/A 01/31/2015   Procedure: ANTERIOR CERVICAL DECOMPRESSION/DISCECTOMY FUSION CERVICAL FIVE SIX;  Surgeon: Karie Chimera, MD;  Location: [facility];  Service: Neurosurgery;  Laterality: N/A;  . BACK SURGERY    . CATARACT EXTRACTION W/PHACO Left 02/06/2017   Procedure: CATARACT EXTRACTION PHACO AND INTRAOCULAR LENS PLACEMENT (IOC);  Surgeon: Eulogio Bear, MD;  Location: [facility];  Service: Ophthalmology;  Laterality: Left;  Lot# [id number] H [place]: 00:37.9 AP%: 7.9 CDE: 2.97  . CATARACT EXTRACTION W/PHACO  Right 03/06/2017   Procedure: CATARACT EXTRACTION PHACO AND INTRAOCULAR LENS PLACEMENT (IOC);  Surgeon: Eulogio Bear, MD;  Location: [facility] ORS;  Service: Ophthalmology;  Laterality: Right;  Lot #[id number] H [place]: 00.22.0 AP%: 7.5 CDE: 1.64   . COLONOSCOPY    . COLONOSCOPY WITH PROPOFOL N/A 09/23/2017   Procedure: COLONOSCOPY WITH PROPOFOL;  Surgeon: Toledo, Benay Pike, MD;  Location: [facility];  Service: Gastroenterology;  Laterality: N/A;  . ELBOW SURGERY Left 04/2003   x2  & for repair & then remove all hardware, due to injury from a fall  . EYE SURGERY    . FRACTURE SURGERY    . HAND SURGERY Right 2006   injury- then eventually lost remainder of thumb  . KNEE ARTHROSCOPY WITH LATERAL MENISECTOMY Left 01/23/2016   Procedure: KNEE ARTHROSCOPY WITH LATERAL MENISECTOMY;  Surgeon: Corky Mull, MD;  Location: [facility]S;  Service: Orthopedics;  Laterality: Left;  . ORIF HUMERUS FRACTURE Right 08/31/2015   Procedure: OPEN REDUCTION INTERNAL FIXATION (ORIF) PROXIMAL HUMERUS FRACTURE w/ #2 fberwire;  Surgeon: Corky Mull, MD;  Location: [facility] ORS;  Service: Orthopedics;  Laterality: Right;  . 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  Subjective Assessment - 11/05/17 0811    Subjective  Patient reports yesterday she was fine in the morning and then later  wore shoes that rubbed on the back of her heel and aggravated her pain. She reports the orthotics help her walk with less heel pain    Pertinent History  History of pain left heel/ankle x 1 year that has worsened and she is not unable to stand or walk without severe difficulty. She is limited in walking, standing activities which interferes with her daily like at home and in community.     Limitations  Standing;Walking;House hold activities;Other (comment) recreational activities, stairs    How long can you stand comfortably?  <30 min.    How long can you walk comfortably?  <60 min.    Diagnostic tests  X  ray: calcaneal spur left ankle, cacification in Achilles tendon    Patient Stated Goals  Patient would like to walk, stand and return to normal activties without pain or swelling left ankle.    Currently in Pain?  Yes    Pain Score  3     Pain Location  Heel    Pain Orientation  Left;Posterior    Pain Descriptors / Indicators  Aching    Pain Type  Chronic pain    Pain Onset  More than a month ago    Pain Frequency  Intermittent         Objective: Left ankle/heel: mild swelling and warmth palpable   Treatment:   Modalities:  Ultrasound:  1 MHz 50% pulsed 1.2 w/cm2 x 10 min. to posterior aspect left ankle/ Achilles tendon with patient prone with left LE supported on pillow goal: pain, inflammation, swelling   Iontophoresis with dexamethasone 75m/ml applied 486mmin using medium sized electrode to posterior aspect of left foot/heel over swollen area (more medial ) with patient supine lying; goal: pain, inflammation; no adverse reactions noted (application 13 miRJJ)(88inutes following application)   High volt estim x 20 min. (2) electrodes applied to left calf with patient supine with left LE supported on pillow, intensity to tolerance: goal: pain    Patient response to treatment: No adverse reactions noted. improved soft tissue elasticity and decreased warmth noted following [place] level improved from 4/10 to <1 /10      PT Education - 11/05/17 0821    Education provided  Yes    Education Details  HEP: active stretching     Person(s) Educated  Patient    Methods  Explanation    Comprehension  Verbalized understanding          PT Long Term Goals - 10/30/17 1001      PT LONG TERM GOAL #1   Title  Patient will demonstrate improved function with daily tasks as indicated by LEFS score of 40/80     Baseline  LEFS 27/80; LEFS 10/30/17 30/80    Status  Partially Met      PT LONG TERM GOAL #2   Title  improved FADI score to 40% or better indcating improved function with  standing, going down steps, walking, standing activties    Baseline  FADI 56% :FADI 10/30/16 52%    Status  Not Met    Target Date  12/11/17      PT LONG TERM GOAL #3   Title  improved FADI score to 30% or less demonstrating improved function with daily tasks involving standing, walking    Baseline  FADI 56%    Status  Revised    Target Date  12/11/17      PT LONG TERM GOAL #4   Title  patient will  be indepenent with self management of pain, home program for ROM, strength by discharge to continue progressing towards prior level of function.     Baseline  limited knowledge of appropriate pain control strategies, exercises and progression without assistance, guidance and cuing    Status  On-going    Target Date  12/11/17            Plan - 11/05/17 7414    Clinical Impression Statement  Patient is progressing well towards goals with decreasing pain with more localized symptoms and able to wear shoes with backs for short periods of time.  She continues with swelling, pain with prolonged walking and with closed in heel shoes.    Rehab Potential  Fair    Clinical Impairments Affecting Rehab Potential  (+)motivated, prior level of function (-)chronic condition, X ray report calcaneal spur with cacifications in tendon    PT Frequency  2x / week    PT Duration  6 weeks    PT Treatment/Interventions  Neuromuscular re-education;Manual techniques;Ultrasound;Electrical Stimulation;Iontophoresis 67m/ml Dexamethasone;Moist Heat;Therapeutic exercise;Therapeutic activities;Patient/family education    PT Next Visit Plan  pain control, iointo, [place] estim., therapeutic exercise, manual techniques, iontophoresis    PT Home Exercise Plan  ice massage and ROM , weight shifting as able       Patient will benefit from skilled therapeutic intervention in order to improve the following deficits and impairments:  Abnormal gait, Pain, Decreased activity tolerance, Decreased range of motion, Decreased strength,  Difficulty walking, Obesity  Visit Diagnosis: Pain in left ankle and joints of left foot  Muscle weakness (generalized)  Other abnormalities of gait and mobility  Difficulty in walking, not elsewhere classified     Problem List Patient Active Problem List   Diagnosis Date Noted  . 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    BJomarie LongsPT 11/05/2017, 8:32 AM  [facility] [address] [place] [address]Phone: [phone]  Fax:  [phone] Name: LMATILDA FLEIGMRN: [id number] of Birth: 4February 19, 1947

## 2017-11-11 ENCOUNTER — Encounter: Payer: Self-pay | Admitting: Physical Therapy

## 2017-11-11 ENCOUNTER — Ambulatory Visit: Payer: PPO | Attending: Podiatry | Admitting: Physical Therapy

## 2017-11-11 DIAGNOSIS — R262 Difficulty in walking, not elsewhere classified: Secondary | ICD-10-CM

## 2017-11-11 DIAGNOSIS — M25572 Pain in left ankle and joints of left foot: Secondary | ICD-10-CM

## 2017-11-11 DIAGNOSIS — M6281 Muscle weakness (generalized): Secondary | ICD-10-CM | POA: Diagnosis not present

## 2017-11-11 DIAGNOSIS — R2689 Other abnormalities of gait and mobility: Secondary | ICD-10-CM | POA: Diagnosis not present

## 2017-11-11 NOTE — Therapy (Signed)
[facility] [address] [name], [address] Phone: [phone]   Fax:  [phone]  Physical Therapy Treatment  Patient Details  Name: Laura Hale MRN: [id number] Date of Birth: 1945-11-30 Referring Provider: Samara Deist DPM   Encounter Date: 11/11/2017  PT End of Session - 11/11/17 0939    Visit Number  14    Number of Visits  24    Date for PT Re-Evaluation  12/11/17    PT Start Time  0902    PT Stop Time  0948    PT Time Calculation (min)  46 min    Activity Tolerance  Patient tolerated treatment well;Patient limited by pain    Behavior During Therapy  [facility] for tasks assessed/performed       Past Medical History:  Diagnosis Date  . Arthritis    elbow, hands, neck   . Asthma    allergy induced asthma-only exacerbated by smells like perfume, smoke etc  . Atypical childhood psychosis 2007   myoview -negative for ischemia with preserved LV function  . Colon polyps   . Complication of anesthesia    WITH ACDF IN JUNE 2016-PT STATES SHE LOST HER VOICE X 10 WEEKS DUE TO INTUBATION  . COPD (chronic obstructive pulmonary disease) ([place])   . Edema    LEGS/FEET  . Hypercalcemia 2009   calcium  WNL 10.2 on 04-2015  . Hyperlipidemia   . Obesity   . Urinary incontinence     Past Surgical History:  Procedure Laterality Date  . ABDOMINAL HYSTERECTOMY    . ANTERIOR CERVICAL DECOMP/DISCECTOMY FUSION N/A 01/31/2015   Procedure: ANTERIOR CERVICAL DECOMPRESSION/DISCECTOMY FUSION CERVICAL FIVE SIX;  Surgeon: Karie Chimera, MD;  Location: [facility];  Service: Neurosurgery;  Laterality: N/A;  . BACK SURGERY    . CATARACT EXTRACTION W/PHACO Left 02/06/2017   Procedure: CATARACT EXTRACTION PHACO AND INTRAOCULAR LENS PLACEMENT (IOC);  Surgeon: Eulogio Bear, MD;  Location: [facility];  Service: Ophthalmology;  Laterality: Left;  Lot# [id number] H [place]: 00:37.9 AP%: 7.9 CDE: 2.97  . CATARACT EXTRACTION W/PHACO  Right 03/06/2017   Procedure: CATARACT EXTRACTION PHACO AND INTRAOCULAR LENS PLACEMENT (IOC);  Surgeon: Eulogio Bear, MD;  Location: [facility];  Service: Ophthalmology;  Laterality: Right;  Lot #[id number] H [place]: 00.22.0 AP%: 7.5 CDE: 1.64   . COLONOSCOPY    . COLONOSCOPY WITH PROPOFOL N/A 09/23/2017   Procedure: COLONOSCOPY WITH PROPOFOL;  Surgeon: Toledo, Benay Pike, MD;  Location: [facility];  Service: Gastroenterology;  Laterality: N/A;  . ELBOW SURGERY Left 04/2003   x2  & for repair & then remove all hardware, due to injury from a fall  . EYE SURGERY    . FRACTURE SURGERY    . HAND SURGERY Right 2006   injury- then eventually lost remainder of thumb  . KNEE ARTHROSCOPY WITH LATERAL MENISECTOMY Left 01/23/2016   Procedure: KNEE ARTHROSCOPY WITH LATERAL MENISECTOMY;  Surgeon: Corky Mull, MD;  Location: [facility];  Service: Orthopedics;  Laterality: Left;  . ORIF HUMERUS FRACTURE Right 08/31/2015   Procedure: OPEN REDUCTION INTERNAL FIXATION (ORIF) PROXIMAL HUMERUS FRACTURE w/ #2 fberwire;  Surgeon: Corky Mull, MD;  Location: [facility];  Service: Orthopedics;  Laterality: Right;  . 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  Subjective Assessment - 11/11/17 0935    Subjective  Patient reports she had a good weekend and was able to wear  sling back shoes without increased pain in left heel (wore shoes for 12 hours on Saturday). She continues with occasional sharp pains in back of heel over swollen area.     Pertinent History  History of pain left heel/ankle x 1 year that has worsened and she is not unable to stand or walk without severe difficulty. She is limited in walking, standing activities which interferes with her daily like at home and in community.     Limitations  Standing;Walking;House hold activities;Other (comment) recreational activities, stairs    How long can you stand comfortably?  <30 min.    How long can you walk comfortably?  <60  min.    Diagnostic tests  X ray: calcaneal spur left ankle, cacification in Achilles tendon    Patient Stated Goals  Patient would like to walk, stand and return to normal activties without pain or swelling left ankle.    Currently in Pain?  Yes    Pain Score  2     Pain Location  Heel    Pain Orientation  Left;Posterior    Pain Descriptors / Indicators  Aching    Pain Type  Chronic pain    Pain Onset  More than a month ago    Pain Frequency  Intermittent       Objective: Left ankle/heel: mild swelling and warmth palpable Palpation: left calf increased spasms medially   Treatment:   Manual therapy: 8 min; goal: improved soft tissue elasticity Patient prone with LE's supported: STM to calf medial > lateral superficial and deep techniques with improved elasticity, decreased spasms   Modalities:  Ultrasound:  1 and 3 MHz 50% pulsed 1.2 w/cm2 x 10 min. to posterior aspect left ankle/ Achilles tendon with patient prone with left LE supported on pillow goal: pain, inflammation, swelling   Iontophoresis with dexamethasone 2m/ml applied 482mmin using medium sized electrode to posterior aspect of left foot/heel over swollen area (more medial ) with patient prone ying; goal: pain, inflammation; no adverse reactions noted (application 10 miTFT)(~[id number] application)   High volt estim x 15 min. (2) electrodes applied to left calf with patient prone with left LE supported on pillow, intensity to tolerance: goal: pain    Patient response to treatment: No adverse reactions noted. improved soft tissue elasticity and decreased warmth noted following [place] level decreased to <1/10     PT Education - 11/11/17 0937    Education provided  Yes    Education Details  HEP re assessed active stretching of calf    Person(s) Educated  Patient    Methods  Explanation;Demonstration    Comprehension  Verbalized understanding          PT Long Term Goals - 10/30/17 1001      PT LONG TERM  GOAL #1   Title  Patient will demonstrate improved function with daily tasks as indicated by LEFS score of 40/80     Baseline  LEFS 27/80; LEFS 10/30/17 30/80    Status  Partially Met      PT LONG TERM GOAL #2   Title  improved FADI score to 40% or better indcating improved function with standing, going down steps, walking, standing activties    Baseline  FADI 56% :FADI 10/30/16 52%    Status  Not Met    Target Date  12/11/17      PT LONG TERM GOAL #3   Title  improved FADI score to 30% or less demonstrating improved function with daily  tasks involving standing, walking    Baseline  FADI 56%    Status  Revised    Target Date  12/11/17      PT LONG TERM GOAL #4   Title  patient will be indepenent with self management of pain, home program for ROM, strength by discharge to continue progressing towards prior level of function.     Baseline  limited knowledge of appropriate pain control strategies, exercises and progression without assistance, guidance and cuing    Status  On-going    Target Date  12/11/17            Plan - 11/11/17 0943    Clinical Impression Statement  Patient is progressing steadily towards goals with decreasing pain and able to wear her sandals with less pain for longer periods of time.    Rehab Potential  Fair    Clinical Impairments Affecting Rehab Potential  (+)motivated, prior level of function (-)chronic condition, X ray report calcaneal spur with cacifications in tendon    PT Frequency  2x / week    PT Duration  6 weeks    PT Treatment/Interventions  Neuromuscular re-education;Manual techniques;Ultrasound;Electrical Stimulation;Iontophoresis 83m/ml Dexamethasone;Moist Heat;Therapeutic exercise;Therapeutic activities;Patient/family education    PT Next Visit Plan  pain control, iointo, [place] estim., therapeutic exercise, manual techniques, iontophoresis    PT Home Exercise Plan  ice massage and ROM , weight shifting as able       Patient will benefit from  skilled therapeutic intervention in order to improve the following deficits and impairments:  Abnormal gait, Pain, Decreased activity tolerance, Decreased range of motion, Decreased strength, Difficulty walking, Obesity  Visit Diagnosis: Pain in left ankle and joints of left foot  Muscle weakness (generalized)  Other abnormalities of gait and mobility  Difficulty in walking, not elsewhere classified     Problem List Patient Active Problem List   Diagnosis Date Noted  . 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    BJomarie LongsPT 11/11/2017, 9:52 AM  [facility] [address] [place] [address]Phone: [phone]  Fax:  [phone] Name: Laura REDELMRN: [id number] of Birth: 408-18-1947

## 2017-11-13 ENCOUNTER — Ambulatory Visit: Payer: PPO | Admitting: Physical Therapy

## 2017-11-13 ENCOUNTER — Encounter: Payer: Self-pay | Admitting: Physical Therapy

## 2017-11-13 DIAGNOSIS — M25572 Pain in left ankle and joints of left foot: Secondary | ICD-10-CM

## 2017-11-13 DIAGNOSIS — G4733 Obstructive sleep apnea (adult) (pediatric): Secondary | ICD-10-CM | POA: Diagnosis not present

## 2017-11-13 DIAGNOSIS — R262 Difficulty in walking, not elsewhere classified: Secondary | ICD-10-CM

## 2017-11-13 DIAGNOSIS — M6281 Muscle weakness (generalized): Secondary | ICD-10-CM

## 2017-11-14 NOTE — Therapy (Signed)
[facility] [address], [name], [address] Phone: [phone]   Fax:  [phone]  Physical Therapy Treatment  Patient Details  Name: Laura Hale MRN: [id number] Date of Birth: Jul 10, 1946 Referring Provider: Samara Deist DPM   Encounter Date: 11/13/2017  PT End of Session - 11/13/17 1125    Visit Number  15    Number of Visits  24    Date for PT Re-Evaluation  12/11/17    PT Start Time  6269    PT Stop Time  1053    PT Time Calculation (min)  51 min    Activity Tolerance  Patient tolerated treatment well;Patient limited by pain    Behavior During Therapy  [facility] for tasks assessed/performed       Past Medical History:  Diagnosis Date  . Arthritis    elbow, hands, neck   . Asthma    allergy induced asthma-only exacerbated by smells like perfume, smoke etc  . Atypical childhood psychosis 2007   myoview -negative for ischemia with preserved LV function  . Colon polyps   . Complication of anesthesia    WITH ACDF IN JUNE 2016-PT STATES SHE LOST HER VOICE X 10 WEEKS DUE TO INTUBATION  . COPD (chronic obstructive pulmonary disease) ([place])   . Edema    LEGS/FEET  . Hypercalcemia 2009   calcium  WNL 10.2 on 04-2015  . Hyperlipidemia   . Obesity   . Urinary incontinence     Past Surgical History:  Procedure Laterality Date  . ABDOMINAL HYSTERECTOMY    . ANTERIOR CERVICAL DECOMP/DISCECTOMY FUSION N/A 01/31/2015   Procedure: ANTERIOR CERVICAL DECOMPRESSION/DISCECTOMY FUSION CERVICAL FIVE SIX;  Surgeon: Karie Chimera, MD;  Location: [facility];  Service: Neurosurgery;  Laterality: N/A;  . BACK SURGERY    . CATARACT EXTRACTION W/PHACO Left 02/06/2017   Procedure: CATARACT EXTRACTION PHACO AND INTRAOCULAR LENS PLACEMENT (IOC);  Surgeon: Eulogio Bear, MD;  Location: [facility];  Service: Ophthalmology;  Laterality: Left;  Lot# [id number] H [place]: 00:37.9 AP%: 7.9 CDE: 2.97  . CATARACT EXTRACTION W/PHACO  Right 03/06/2017   Procedure: CATARACT EXTRACTION PHACO AND INTRAOCULAR LENS PLACEMENT (IOC);  Surgeon: Eulogio Bear, MD;  Location: [facility];  Service: Ophthalmology;  Laterality: Right;  Lot #[id number] H [place]: 00.22.0 AP%: 7.5 CDE: 1.64   . COLONOSCOPY    . COLONOSCOPY WITH PROPOFOL N/A 09/23/2017   Procedure: COLONOSCOPY WITH PROPOFOL;  Surgeon: Toledo, Benay Pike, MD;  Location: [facility];  Service: Gastroenterology;  Laterality: N/A;  . ELBOW SURGERY Left 04/2003   x2  & for repair & then remove all hardware, due to injury from a fall  . EYE SURGERY    . FRACTURE SURGERY    . HAND SURGERY Right 2006   injury- then eventually lost remainder of thumb  . KNEE ARTHROSCOPY WITH LATERAL MENISECTOMY Left 01/23/2016   Procedure: KNEE ARTHROSCOPY WITH LATERAL MENISECTOMY;  Surgeon: Corky Mull, MD;  Location: [facility];  Service: Orthopedics;  Laterality: Left;  . ORIF HUMERUS FRACTURE Right 08/31/2015   Procedure: OPEN REDUCTION INTERNAL FIXATION (ORIF) PROXIMAL HUMERUS FRACTURE w/ #2 fberwire;  Surgeon: Corky Mull, MD;  Location: [facility];  Service: Orthopedics;  Laterality: Right;  . 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  Subjective Assessment - 11/13/17 1123    Subjective  Patient reports she continues to see progress with less pain in left  heel and able to walk for longer periods of time with less difficulty    Pertinent History  History of pain left heel/ankle x 1 year that has worsened and she is not unable to stand or walk without severe difficulty. She is limited in walking, standing activities which interferes with her daily like at home and in community.     Limitations  Standing;Walking;House hold activities;Other (comment) recreational activities, stairs    How long can you stand comfortably?  <30 min.    How long can you walk comfortably?  <60 min.    Diagnostic tests  X ray: calcaneal spur left ankle, cacification in Achilles tendon     Patient Stated Goals  Patient would like to walk, stand and return to normal activties without pain or swelling left ankle.    Currently in Pain?  No/denies tight calf muscle left LE        Objective: Left ankle/heel: mild swelling and warmth palpable posterior heel central and medial, improved from previous session Palpation: left calf with palpable spasms along medial, lateral aspect   Treatment:   Manual therapy: 8 min; goal: improved soft tissue elasticity Patient prone with LE's supported: STM to calf medial > lateral superficial and deep techniques with improved elasticity, decreased spasms    Modalities:  Ultrasound:  1 and 3 MHz 50% pulsed 1.2 w/cm2 x 10 min. to posterior aspect left ankle/ Achilles tendon with patient prone with left LE supported on pillow goal: pain, inflammation, swelling   Iontophoresis with dexamethasone 57m/ml applied 470mmin using medium sized electrode to posterior aspect of left foot/heel over swollen area (more medial ) with patient prone ying; goal: pain, inflammation; no adverse reactions noted (application 10 miLPF)(~[id number] application)   High volt estim x 15 min. (2) electrodes applied to left calf with patient prone with left LE supported on pillow, intensity to tolerance: goal: pain    Patient response to treatment: improved soft tissue elsticity with decreased spasms left calf by 50% following STM. patient reported improvement in calf, feeling less tight and no pain in left hee at end of session    PT Education - 11/13/17 1124    Education provided  Yes    Education Details  progress, status of muscle and heel     Person(s) Educated  Patient    Methods  Explanation    Comprehension  Verbalized understanding          PT Long Term Goals - 10/30/17 1001      PT LONG TERM GOAL #1   Title  Patient will demonstrate improved function with daily tasks as indicated by LEFS score of 40/80     Baseline  LEFS 27/80; LEFS 10/30/17  30/80    Status  Partially Met      PT LONG TERM GOAL #2   Title  improved FADI score to 40% or better indcating improved function with standing, going down steps, walking, standing activties    Baseline  FADI 56% :FADI 10/30/16 52%    Status  Not Met    Target Date  12/11/17      PT LONG TERM GOAL #3   Title  improved FADI score to 30% or less demonstrating improved function with daily tasks involving standing, walking    Baseline  FADI 56%    Status  Revised    Target Date  12/11/17      PT LONG TERM GOAL #4   Title  patient will be indepenent with  self management of pain, home program for ROM, strength by discharge to continue progressing towards prior level of function.     Baseline  limited knowledge of appropriate pain control strategies, exercises and progression without assistance, guidance and cuing    Status  On-going    Target Date  12/11/17            Plan - 11/13/17 1146    Clinical Impression Statement  Patient continues to demonstrate imrpovement with left heel with less swellling, pain and improved function with ambulation. She continues with decreased soft tissue elasticity and spasms in left calf muscles and will benefit from additional physical therapy intervention.     Rehab Potential  Fair    Clinical Impairments Affecting Rehab Potential  (+)motivated, prior level of function (-)chronic condition, X ray report calcaneal spur with cacifications in tendon    PT Frequency  2x / week    PT Duration  6 weeks    PT Treatment/Interventions  Neuromuscular re-education;Manual techniques;Ultrasound;Electrical Stimulation;Iontophoresis 47m/ml Dexamethasone;Moist Heat;Therapeutic exercise;Therapeutic activities;Patient/family education    PT Next Visit Plan  pain control, iointo, [place] estim., therapeutic exercise, manual techniques, iontophoresis    PT Home Exercise Plan  ice massage and ROM , weight shifting as able       Patient will benefit from skilled therapeutic  intervention in order to improve the following deficits and impairments:  Abnormal gait, Pain, Decreased activity tolerance, Decreased range of motion, Decreased strength, Difficulty walking, Obesity  Visit Diagnosis: Pain in left ankle and joints of left foot  Muscle weakness (generalized)  Difficulty in walking, not elsewhere classified     Problem List Patient Active Problem List   Diagnosis Date Noted  . 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    BJomarie LongsPT 11/14/2017, 8:53 AM  [facility] [address] [place] [address]Phone: [phone]  Fax:  [phone] Name: Laura REINERTSENMRN: [id number] of Birth: 4October 09, 1947

## 2017-11-17 ENCOUNTER — Ambulatory Visit: Payer: PPO | Admitting: Physical Therapy

## 2017-11-17 ENCOUNTER — Encounter: Payer: Self-pay | Admitting: Physical Therapy

## 2017-11-17 DIAGNOSIS — M6281 Muscle weakness (generalized): Secondary | ICD-10-CM

## 2017-11-17 DIAGNOSIS — M25572 Pain in left ankle and joints of left foot: Secondary | ICD-10-CM | POA: Diagnosis not present

## 2017-11-17 DIAGNOSIS — R262 Difficulty in walking, not elsewhere classified: Secondary | ICD-10-CM

## 2017-11-17 DIAGNOSIS — G4733 Obstructive sleep apnea (adult) (pediatric): Secondary | ICD-10-CM | POA: Diagnosis not present

## 2017-11-17 NOTE — Therapy (Signed)
[facility] [address] [name], [address] Phone: [phone]   Fax:  [phone]  Physical Therapy Treatment  Patient Details  Name: Laura Hale MRN: [id number] Date of Birth: 17-Jul-1946 Referring Provider: Samara Deist DPM   Encounter Date: 11/17/2017  PT End of Session - 11/17/17 0824    Visit Number  16    Number of Visits  24    Date for PT Re-Evaluation  12/11/17    PT Start Time  0744    PT Stop Time  0835    PT Time Calculation (min)  51 min    Activity Tolerance  Patient tolerated treatment well;Patient limited by pain    Behavior During Therapy  [facility] for tasks assessed/performed       Past Medical History:  Diagnosis Date  . Arthritis    elbow, hands, neck   . Asthma    allergy induced asthma-only exacerbated by smells like perfume, smoke etc  . Atypical childhood psychosis 2007   myoview -negative for ischemia with preserved LV function  . Colon polyps   . Complication of anesthesia    WITH ACDF IN JUNE 2016-PT STATES SHE LOST HER VOICE X 10 WEEKS DUE TO INTUBATION  . COPD (chronic obstructive pulmonary disease) ([place])   . Edema    LEGS/FEET  . Hypercalcemia 2009   calcium  WNL 10.2 on 04-2015  . Hyperlipidemia   . Obesity   . Urinary incontinence     Past Surgical History:  Procedure Laterality Date  . ABDOMINAL HYSTERECTOMY    . ANTERIOR CERVICAL DECOMP/DISCECTOMY FUSION N/A 01/31/2015   Procedure: ANTERIOR CERVICAL DECOMPRESSION/DISCECTOMY FUSION CERVICAL FIVE SIX;  Surgeon: Karie Chimera, MD;  Location: [facility];  Service: Neurosurgery;  Laterality: N/A;  . BACK SURGERY    . CATARACT EXTRACTION W/PHACO Left 02/06/2017   Procedure: CATARACT EXTRACTION PHACO AND INTRAOCULAR LENS PLACEMENT (IOC);  Surgeon: Eulogio Bear, MD;  Location: [facility];  Service: Ophthalmology;  Laterality: Left;  Lot# [id number] H [place]: 00:37.9 AP%: 7.9 CDE: 2.97  . CATARACT EXTRACTION W/PHACO  Right 03/06/2017   Procedure: CATARACT EXTRACTION PHACO AND INTRAOCULAR LENS PLACEMENT (IOC);  Surgeon: Eulogio Bear, MD;  Location: [facility];  Service: Ophthalmology;  Laterality: Right;  Lot #[id number] H [place]: 00.22.0 AP%: 7.5 CDE: 1.64   . COLONOSCOPY    . COLONOSCOPY WITH PROPOFOL N/A 09/23/2017   Procedure: COLONOSCOPY WITH PROPOFOL;  Surgeon: Toledo, Benay Pike, MD;  Location: [facility];  Service: Gastroenterology;  Laterality: N/A;  . ELBOW SURGERY Left 04/2003   x2  & for repair & then remove all hardware, due to injury from a fall  . EYE SURGERY    . FRACTURE SURGERY    . HAND SURGERY Right 2006   injury- then eventually lost remainder of thumb  . KNEE ARTHROSCOPY WITH LATERAL MENISECTOMY Left 01/23/2016   Procedure: KNEE ARTHROSCOPY WITH LATERAL MENISECTOMY;  Surgeon: Corky Mull, MD;  Location: [facility];  Service: Orthopedics;  Laterality: Left;  . ORIF HUMERUS FRACTURE Right 08/31/2015   Procedure: OPEN REDUCTION INTERNAL FIXATION (ORIF) PROXIMAL HUMERUS FRACTURE w/ #2 fberwire;  Surgeon: Corky Mull, MD;  Location: [facility];  Service: Orthopedics;  Laterality: Right;  . 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  Subjective Assessment - 11/17/17 0745    Subjective  Patient reports she is doing well with left ankle. she has primary  concern of tightness in heel. She did well following last session.     Pertinent History  History of pain left heel/ankle x 1 year that has worsened and she is not unable to stand or walk without severe difficulty. She is limited in walking, standing activities which interferes with her daily like at home and in community.     Limitations  Standing;Walking;House hold activities;Other (comment) recreational activities, stairs    How long can you stand comfortably?  <30 min.    How long can you walk comfortably?  <60 min.    Diagnostic tests  X ray: calcaneal spur left ankle, cacification in Achilles tendon     Patient Stated Goals  Patient would like to walk, stand and return to normal activties without pain or swelling left ankle.    Currently in Pain?  Yes    Pain Score  2     Pain Location  Ankle    Pain Orientation  Left;Posterior    Pain Descriptors / Indicators  Aching    Pain Type  Chronic pain    Pain Onset  More than a month ago    Pain Frequency  Intermittent         Objective: Left ankle/heel: mild swelling and warmth palpable posterior heel central and medial, improved from previous session Palpation: left calf with palpable spasms along medial, lateral aspect   Treatment:   Manual therapy: 8 min; goal: improved soft tissue elasticity Patient prone with LE's supported: STM to calf medial > lateral superficial and deep techniques with improved elasticity, decreased spasms    Modalities:  Ultrasound:  1 MHz 50% pulsed and continuous 1.2 w/cm2 x 10 min. to posterior aspect left ankle/ Achilles tendon with patient prone with left LE supported on pillow goal: pain, inflammation, swelling   Iontophoresis with dexamethasone 80m/ml applied 448mmin using medium sized electrode to posterior aspect of left foot/heel over swollen area (more medial ) with patient prone ying; goal: pain, inflammation; no adverse reactions noted (application 10 miZOX)(~[id number] application)   High volt estim x 20 min. (2) electrodes applied to left calf over spasms with patient prone with left LE supported on pillow, intensity to tolerance: goal: pain    Patient response to treatment: improved soft tissue elasticity with decreased spasms left calf by up to 50% following STM/US. Patient reported less stiffness/tightness left calf/heel at end of session with movement.       PT Education - 11/17/17 [id number]  Education provided  Yes    Education Details  progress with goals    Person(s) Educated  Patient    Methods  Explanation    Comprehension  Verbalized understanding          PT Long Term  Goals - 10/30/17 1001      PT LONG TERM GOAL #1   Title  Patient will demonstrate improved function with daily tasks as indicated by LEFS score of 40/80     Baseline  LEFS 27/80; LEFS 10/30/17 30/80    Status  Partially Met      PT LONG TERM GOAL #2   Title  improved FADI score to 40% or better indcating improved function with standing, going down steps, walking, standing activties    Baseline  FADI 56% :FADI 10/30/16 52%    Status  Not Met    Target Date  12/11/17      PT LONG TERM GOAL #3   Title  improved FADI score to 30% or  less demonstrating improved function with daily tasks involving standing, walking    Baseline  FADI 56%    Status  Revised    Target Date  12/11/17      PT LONG TERM GOAL #4   Title  patient will be indepenent with self management of pain, home program for ROM, strength by discharge to continue progressing towards prior level of function.     Baseline  limited knowledge of appropriate pain control strategies, exercises and progression without assistance, guidance and cuing    Status  On-going    Target Date  12/11/17            Plan - 11/17/17 0824    Clinical Impression Statement  Patient continues to progress steadily towards goals and is responding well to current treatment with good carry over between sessions. She should continue to progress with continued physical therapy intervention.     Rehab Potential  Fair    Clinical Impairments Affecting Rehab Potential  (+)motivated, prior level of function (-)chronic condition, X ray report calcaneal spur with cacifications in tendon    PT Frequency  2x / week    PT Duration  6 weeks    PT Treatment/Interventions  Neuromuscular re-education;Manual techniques;Ultrasound;Electrical Stimulation;Iontophoresis 68m/ml Dexamethasone;Moist Heat;Therapeutic exercise;Therapeutic activities;Patient/family education    PT Next Visit Plan  pain control, iointo, [place] estim., therapeutic exercise, manual techniques,  iontophoresis    PT Home Exercise Plan  ice massage and ROM , weight shifting as able       Patient will benefit from skilled therapeutic intervention in order to improve the following deficits and impairments:  Abnormal gait, Pain, Decreased activity tolerance, Decreased range of motion, Decreased strength, Difficulty walking, Obesity  Visit Diagnosis: Pain in left ankle and joints of left foot  Muscle weakness (generalized)  Difficulty in walking, not elsewhere classified     Problem List Patient Active Problem List   Diagnosis Date Noted  . 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    BJomarie LongsPT 11/17/2017, 8:48 AM  [facility] [address] [place] [address]Phone: [phone]  Fax:  [phone] Name: Laura KAGELMRN: [id number] of Birth: [birth year]

## 2017-11-19 ENCOUNTER — Ambulatory Visit: Payer: PPO | Admitting: Physical Therapy

## 2017-11-19 ENCOUNTER — Encounter: Payer: Self-pay | Admitting: Physical Therapy

## 2017-11-19 DIAGNOSIS — M6281 Muscle weakness (generalized): Secondary | ICD-10-CM

## 2017-11-19 DIAGNOSIS — E7849 Other hyperlipidemia: Secondary | ICD-10-CM | POA: Diagnosis not present

## 2017-11-19 DIAGNOSIS — M25572 Pain in left ankle and joints of left foot: Secondary | ICD-10-CM | POA: Diagnosis not present

## 2017-11-19 DIAGNOSIS — R262 Difficulty in walking, not elsewhere classified: Secondary | ICD-10-CM

## 2017-11-19 DIAGNOSIS — R739 Hyperglycemia, unspecified: Secondary | ICD-10-CM | POA: Diagnosis not present

## 2017-11-19 DIAGNOSIS — G4733 Obstructive sleep apnea (adult) (pediatric): Secondary | ICD-10-CM | POA: Diagnosis not present

## 2017-11-19 DIAGNOSIS — I10 Essential (primary) hypertension: Secondary | ICD-10-CM | POA: Diagnosis not present

## 2017-11-19 DIAGNOSIS — K219 Gastro-esophageal reflux disease without esophagitis: Secondary | ICD-10-CM | POA: Diagnosis not present

## 2017-11-19 NOTE — Therapy (Signed)
[facility] [address] [name], [address] Phone: [phone]   Fax:  [phone]  Physical Therapy Treatment  Patient Details  Name: Laura Hale MRN: [id number] Date of Birth: Jan 19, 1946 Referring Provider: Samara Deist DPM   Encounter Date: 11/19/2017  PT End of Session - 11/19/17 0849    Visit Number  17    Number of Visits  24    Date for PT Re-Evaluation  12/11/17    PT Start Time  0810    PT Stop Time  0905    PT Time Calculation (min)  55 min    Activity Tolerance  Patient tolerated treatment well;Patient limited by pain    Behavior During Therapy  [facility] for tasks assessed/performed       Past Medical History:  Diagnosis Date  . Arthritis    elbow, hands, neck   . Asthma    allergy induced asthma-only exacerbated by smells like perfume, smoke etc  . Atypical childhood psychosis 2007   myoview -negative for ischemia with preserved LV function  . Colon polyps   . Complication of anesthesia    WITH ACDF IN JUNE 2016-PT STATES SHE LOST HER VOICE X 10 WEEKS DUE TO INTUBATION  . COPD (chronic obstructive pulmonary disease) ([place])   . Edema    LEGS/FEET  . Hypercalcemia 2009   calcium  WNL 10.2 on 04-2015  . Hyperlipidemia   . Obesity   . Urinary incontinence     Past Surgical History:  Procedure Laterality Date  . ABDOMINAL HYSTERECTOMY    . ANTERIOR CERVICAL DECOMP/DISCECTOMY FUSION N/A 01/31/2015   Procedure: ANTERIOR CERVICAL DECOMPRESSION/DISCECTOMY FUSION CERVICAL FIVE SIX;  Surgeon: Karie Chimera, MD;  Location: [facility];  Service: Neurosurgery;  Laterality: N/A;  . BACK SURGERY    . CATARACT EXTRACTION W/PHACO Left 02/06/2017   Procedure: CATARACT EXTRACTION PHACO AND INTRAOCULAR LENS PLACEMENT (IOC);  Surgeon: Eulogio Bear, MD;  Location: [facility];  Service: Ophthalmology;  Laterality: Left;  Lot# [id number] H [place]: 00:37.9 AP%: 7.9 CDE: 2.97  . CATARACT EXTRACTION W/PHACO  Right 03/06/2017   Procedure: CATARACT EXTRACTION PHACO AND INTRAOCULAR LENS PLACEMENT (IOC);  Surgeon: Eulogio Bear, MD;  Location: [facility];  Service: Ophthalmology;  Laterality: Right;  Lot #[id number] H [place]: 00.22.0 AP%: 7.5 CDE: 1.64   . COLONOSCOPY    . COLONOSCOPY WITH PROPOFOL N/A 09/23/2017   Procedure: COLONOSCOPY WITH PROPOFOL;  Surgeon: Toledo, Benay Pike, MD;  Location: [facility];  Service: Gastroenterology;  Laterality: N/A;  . ELBOW SURGERY Left 04/2003   x2  & for repair & then remove all hardware, due to injury from a fall  . EYE SURGERY    . FRACTURE SURGERY    . HAND SURGERY Right 2006   injury- then eventually lost remainder of thumb  . KNEE ARTHROSCOPY WITH LATERAL MENISECTOMY Left 01/23/2016   Procedure: KNEE ARTHROSCOPY WITH LATERAL MENISECTOMY;  Surgeon: Corky Mull, MD;  Location: [facility];  Service: Orthopedics;  Laterality: Left;  . ORIF HUMERUS FRACTURE Right 08/31/2015   Procedure: OPEN REDUCTION INTERNAL FIXATION (ORIF) PROXIMAL HUMERUS FRACTURE w/ #2 fberwire;  Surgeon: Corky Mull, MD;  Location: [facility];  Service: Orthopedics;  Laterality: Right;  . 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  Subjective Assessment - 11/19/17 0848    Subjective  Patient continues to report she is seeing improvement left heel and is  able to walk with less pain, irritation.     Pertinent History  History of pain left heel/ankle x 1 year that has worsened and she is not unable to stand or walk without severe difficulty. She is limited in walking, standing activities which interferes with her daily like at home and in community.     Limitations  Standing;Walking;House hold activities;Other (comment) recreational activities, stairs    How long can you stand comfortably?  <30 min.    How long can you walk comfortably?  <60 min.    Diagnostic tests  X ray: calcaneal spur left ankle, cacification in Achilles tendon    Patient Stated Goals   Patient would like to walk, stand and return to normal activties without pain or swelling left ankle.    Currently in Pain?  Yes    Pain Score  2     Pain Location  Ankle    Pain Orientation  Left    Pain Descriptors / Indicators  Aching    Pain Type  Chronic pain    Pain Onset  More than a month ago         Objective: Left ankle/heel: mild swelling and warmth palpable posterior heel central and medial, improved from previous session Palpation: left calf with palpable spasms along lateral aspect, plantar aspect left foot with decreased soft tissue elasticity   Treatment:   Manual therapy: 12 min; goal: improved soft tissue elasticity Patient prone with LE's supported: STM to calf medial > lateral superficial and deep techniques with improved elasticity, decreased spasms    Modalities:  Ultrasound:  1 MHz 50% pulsed and continuous 1.2 w/cm2 x 10 min. to posterior aspect left ankle/ Achilles tendon with patient prone with left LE supported on pillow goal: pain, inflammation, swelling   Iontophoresis with dexamethasone 19m/ml applied 416mmin using medium sized electrode to posterior aspect of left foot/heel over swollen area (more medial ) with patient prone ying; goal: pain, inflammation; no adverse reactions noted (application 10 miYYT)(~[id number] application)   High volt estim x 20 min. (2) electrodes applied to left calf over spasms with patient prone with left LE supported on pillow, intensity to tolerance: goal: pain    Patient response to treatment: improved soft tissue elasticity left foot and calf with decreased spasms to mild following STM. no adverse reactions to modalities noted      PT Education - 11/19/17 0849    Education provided  Yes    Education Details  re assessed HEP     Person(s) Educated  Patient    Methods  Explanation    Comprehension  Verbalized understanding          PT Long Term Goals - 10/30/17 1001      PT LONG TERM GOAL #1   Title   Patient will demonstrate improved function with daily tasks as indicated by LEFS score of 40/80     Baseline  LEFS 27/80; LEFS 10/30/17 30/80    Status  Partially Met      PT LONG TERM GOAL #2   Title  improved FADI score to 40% or better indcating improved function with standing, going down steps, walking, standing activties    Baseline  FADI 56% :FADI 10/30/16 52%    Status  Not Met    Target Date  12/11/17      PT LONG TERM GOAL #3   Title  improved FADI score to 30% or less demonstrating improved function with daily tasks involving standing,  walking    Baseline  FADI 56%    Status  Revised    Target Date  12/11/17      PT LONG TERM GOAL #4   Title  patient will be indepenent with self management of pain, home program for ROM, strength by discharge to continue progressing towards prior level of function.     Baseline  limited knowledge of appropriate pain control strategies, exercises and progression without assistance, guidance and cuing    Status  On-going    Target Date  12/11/17            Plan - 11/19/17 0849    Clinical Impression Statement  Patient is progressing well towards goals with decreasing warmth in heel, able to wear different shoes with less irritation in heel left foot. She continues with increased warmth, tenderness and difficulty with prolonged walking and will therefore benefit from additional physical therapy intervention.     Rehab Potential  Fair    Clinical Impairments Affecting Rehab Potential  (+)motivated, prior level of function (-)chronic condition, X ray report calcaneal spur with cacifications in tendon    PT Frequency  2x / week    PT Duration  6 weeks    PT Treatment/Interventions  Neuromuscular re-education;Manual techniques;Ultrasound;Electrical Stimulation;Iontophoresis 11m/ml Dexamethasone;Moist Heat;Therapeutic exercise;Therapeutic activities;Patient/family education    PT Next Visit Plan  pain control, iointo, [place] estim., therapeutic  exercise, manual techniques, iontophoresis    PT Home Exercise Plan  ice massage and ROM , weight shifting as able       Patient will benefit from skilled therapeutic intervention in order to improve the following deficits and impairments:  Abnormal gait, Pain, Decreased activity tolerance, Decreased range of motion, Decreased strength, Difficulty walking, Obesity  Visit Diagnosis: Pain in left ankle and joints of left foot  Muscle weakness (generalized)  Difficulty in walking, not elsewhere classified     Problem List Patient Active Problem List   Diagnosis Date Noted  . 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    BJomarie LongsPT 11/19/2017, 10:58 AM  [facility] [address] [place] [id number]: [phone]  Fax:  [phone] Name: Laura HOWLANDMRN: [id number] of Birth: 4January 21, 1947

## 2017-11-24 ENCOUNTER — Encounter: Payer: Self-pay | Admitting: Physical Therapy

## 2017-11-24 ENCOUNTER — Ambulatory Visit: Payer: PPO | Admitting: Physical Therapy

## 2017-11-24 DIAGNOSIS — M6281 Muscle weakness (generalized): Secondary | ICD-10-CM

## 2017-11-24 DIAGNOSIS — R262 Difficulty in walking, not elsewhere classified: Secondary | ICD-10-CM

## 2017-11-24 DIAGNOSIS — M25572 Pain in left ankle and joints of left foot: Secondary | ICD-10-CM | POA: Diagnosis not present

## 2017-11-24 NOTE — Therapy (Signed)
[facility] [address], [name], [address] Phone: [phone]   Fax:  [phone]  Physical Therapy Treatment  Patient Details  Name: Laura Hale MRN: [id number] Date of Birth: 30-Dec-1945 Referring Provider: Samara Deist DPM   Encounter Date: 11/24/2017  PT End of Session - 11/24/17 0846    Visit Number  18    Number of Visits  24    Date for PT Re-Evaluation  12/11/17    PT Start Time  0845    PT Stop Time  0933    PT Time Calculation (min)  48 min    Activity Tolerance  Patient tolerated treatment well;Patient limited by pain    Behavior During Therapy  [facility] for tasks assessed/performed       Past Medical History:  Diagnosis Date  . Arthritis    elbow, hands, neck   . Asthma    allergy induced asthma-only exacerbated by smells like perfume, smoke etc  . Atypical childhood psychosis 2007   myoview -negative for ischemia with preserved LV function  . Colon polyps   . Complication of anesthesia    WITH ACDF IN JUNE 2016-PT STATES SHE LOST HER VOICE X 10 WEEKS DUE TO INTUBATION  . COPD (chronic obstructive pulmonary disease) ([place])   . Edema    LEGS/FEET  . Hypercalcemia 2009   calcium  WNL 10.2 on 04-2015  . Hyperlipidemia   . Obesity   . Urinary incontinence     Past Surgical History:  Procedure Laterality Date  . ABDOMINAL HYSTERECTOMY    . ANTERIOR CERVICAL DECOMP/DISCECTOMY FUSION N/A 01/31/2015   Procedure: ANTERIOR CERVICAL DECOMPRESSION/DISCECTOMY FUSION CERVICAL FIVE SIX;  Surgeon: Karie Chimera, MD;  Location: [place] [facility];  Service: Neurosurgery;  Laterality: N/A;  . BACK SURGERY    . CATARACT EXTRACTION W/PHACO Left 02/06/2017   Procedure: CATARACT EXTRACTION PHACO AND INTRAOCULAR LENS PLACEMENT (IOC);  Surgeon: Eulogio Bear, MD;  Location: [facility];  Service: Ophthalmology;  Laterality: Left;  Lot# [id number] [place]: 00:37.9 AP%: 7.9 CDE: 2.97  . CATARACT EXTRACTION W/PHACO  Right 03/06/2017   Procedure: CATARACT EXTRACTION PHACO AND INTRAOCULAR LENS PLACEMENT (IOC);  Surgeon: Eulogio Bear, MD;  Location: [facility] ORS;  Service: Ophthalmology;  Laterality: Right;  Lot #[id number] H [place]: 00.22.0 AP%: 7.5 CDE: 1.64   . COLONOSCOPY    . COLONOSCOPY WITH PROPOFOL N/A 09/23/2017   Procedure: COLONOSCOPY WITH PROPOFOL;  Surgeon: Toledo, Benay Pike, MD;  Location: [facility] END[facility]COPY;  Service: Gastroenterology;  Laterality: N/A;  . ELBOW SURGERY Left 04/2003   x2  & for repair & then remove all hardware, due to injury from a fall  . EYE SURGERY    . FRACTURE SURGERY    . HAND SURGERY Right 2006   injury- then eventually lost remainder of thumb  . KNEE ARTHROSCOPY WITH LATERAL MENISECTOMY Left 01/23/2016   Procedure: KNEE ARTHROSCOPY WITH LATERAL MENISECTOMY;  Surgeon: Corky Mull, MD;  Location: [facility] ORS;  Service: Orthopedics;  Laterality: Left;  . ORIF HUMERUS FRACTURE Right 08/31/2015   Procedure: OPEN REDUCTION INTERNAL FIXATION (ORIF) PROXIMAL HUMERUS FRACTURE w/ #2 fberwire;  Surgeon: Corky Mull, MD;  Location: [facility] ORS;  Service: Orthopedics;  Laterality: Right;  . 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  Subjective Assessment - 11/24/17 0846    Subjective  Patient continues to report she is seeing improvement left heel and  is responding well to current treatment. She reports she had increased symptoms yesterday and used a compression sleeve sock overnight with less pain this morning. She continues with exacerbations of pain in heel due to bone spur.    Pertinent History  History of pain left heel/ankle x 1 year that has worsened and she is not unable to stand or walk without severe difficulty. She is limited in walking, standing activities which interferes with her daily like at home and in community.     Limitations  Standing;Walking;House hold activities;Other (comment) recreational activities, stairs    How long can you  stand comfortably?  <30 min.    How long can you walk comfortably?  <60 min.    Diagnostic tests  X ray: calcaneal spur left ankle, cacification in Achilles tendon    Patient Stated Goals  Patient would like to walk, stand and return to normal activties without pain or swelling left ankle.    Currently in Pain?  Yes    Pain Score  2     Pain Location  Heel    Pain Orientation  Left    Pain Descriptors / Indicators  Aching;Tightness    Pain Type  Chronic pain    Pain Onset  More than a month ago    Pain Frequency  Intermittent       Objective: Left ankle/heel: mild swelling and warmth palpable posterior heel central and medial, improved from previous session Palpation: left calf with palpable spasms along lateral aspect, plantar aspect left foot with decreased soft tissue elasticity   Treatment:   Manual therapy: 12 min; goal: improved soft tissue elasticity Patient prone with LE's supported: STM to calf medial > lateral superficial and deep techniques with improved elasticity, decreased spasms    Modalities:  Ultrasound:  1 MHz 50% pulsed and continuous 1.2 w/cm2 x 8 min. to posterior aspect left ankle/ Achilles tendon with patient prone with left LE supported on pillow goal: pain, inflammation, swelling   Iontophoresis with dexamethasone 79m/ml applied 416mmin using medium sized electrode to posterior aspect of left foot/heel over swollen area (more medial ) with patient prone ying; goal: pain, inflammation; no adverse reactions noted (application 8 miACZ)(~[id number] application)   High volt estim x 15 min. (2) electrodes applied to left calf over spasms with patient prone with left LE supported on pillow, intensity to tolerance: goal: pain    Patient response to treatment: improved soft tissue elasticity along lateral calf and plantar aspect of left foot by 50% following STM. no adverse reactions to iontophoresis noted. pain level improved to <1/10        PT Education  - 11/24/17 0930    Education provided  Yes    Education Details  re assessed progress with patient and exercises to continue at home    Person(s) Educated  Patient    Methods  Explanation    Comprehension  Verbalized understanding          PT Long Term Goals - 10/30/17 1001      PT LONG TERM GOAL #1   Title  Patient will demonstrate improved function with daily tasks as indicated by LEFS score of 40/80     Baseline  LEFS 27/80; LEFS 10/30/17 30/80    Status  Partially Met      PT LONG TERM GOAL #2   Title  improved FADI score to 40% or better indcating improved function with standing, going down steps, walking, standing activties  Baseline  FADI 56% :FADI 10/30/16 52%    Status  Not Met    Target Date  12/11/17      PT LONG TERM GOAL #3   Title  improved FADI score to 30% or less demonstrating improved function with daily tasks involving standing, walking    Baseline  FADI 56%    Status  Revised    Target Date  12/11/17      PT LONG TERM GOAL #4   Title  patient will be indepenent with self management of pain, home program for ROM, strength by discharge to continue progressing towards prior level of function.     Baseline  limited knowledge of appropriate pain control strategies, exercises and progression without assistance, guidance and cuing    Status  On-going    Target Date  12/11/17            Plan - 11/24/17 6578    Clinical Impression Statement  Patient continues steady progress towards goals with decreasing symptoms, intermittent exacerbation of pain in left heel. she is responding well to current treatment with good carry over between sessions.     Rehab Potential  Fair    Clinical Impairments Affecting Rehab Potential  (+)motivated, prior level of function (-)chronic condition, X ray report calcaneal spur with cacifications in tendon    PT Frequency  2x / week    PT Duration  6 weeks    PT Treatment/Interventions  Neuromuscular re-education;Manual  techniques;Ultrasound;Electrical Stimulation;Iontophoresis 31m/ml Dexamethasone;Moist Heat;Therapeutic exercise;Therapeutic activities;Patient/family education    PT Next Visit Plan  pain control, iointo, [place] estim., therapeutic exercise, manual techniques, iontophoresis    PT Home Exercise Plan  ice massage and ROM , weight shifting as able       Patient will benefit from skilled therapeutic intervention in order to improve the following deficits and impairments:  Abnormal gait, Pain, Decreased activity tolerance, Decreased range of motion, Decreased strength, Difficulty walking, Obesity  Visit Diagnosis: Pain in left ankle and joints of left foot  Muscle weakness (generalized)  Difficulty in walking, not elsewhere classified     Problem List Patient Active Problem List   Diagnosis Date Noted  . 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    BJomarie LongsPT 11/24/2017, 10:27 AM  [facility] [address] [place] [address]Phone: [phone]  Fax:  [phone] Name: LKEIANDRA SULLENGERMRN: [id number] of Birth: [birth year]

## 2017-11-26 DIAGNOSIS — G629 Polyneuropathy, unspecified: Secondary | ICD-10-CM | POA: Diagnosis not present

## 2017-11-26 DIAGNOSIS — K219 Gastro-esophageal reflux disease without esophagitis: Secondary | ICD-10-CM | POA: Diagnosis not present

## 2017-11-26 DIAGNOSIS — R739 Hyperglycemia, unspecified: Secondary | ICD-10-CM | POA: Diagnosis not present

## 2017-11-26 DIAGNOSIS — J449 Chronic obstructive pulmonary disease, unspecified: Secondary | ICD-10-CM | POA: Diagnosis not present

## 2017-11-26 DIAGNOSIS — Z6841 Body Mass Index (BMI) 40.0 and over, adult: Secondary | ICD-10-CM | POA: Diagnosis not present

## 2017-11-26 DIAGNOSIS — I10 Essential (primary) hypertension: Secondary | ICD-10-CM | POA: Diagnosis not present

## 2017-11-26 DIAGNOSIS — E7849 Other hyperlipidemia: Secondary | ICD-10-CM | POA: Diagnosis not present

## 2017-11-26 DIAGNOSIS — E559 Vitamin D deficiency, unspecified: Secondary | ICD-10-CM | POA: Diagnosis not present

## 2017-11-26 DIAGNOSIS — D72820 Lymphocytosis (symptomatic): Secondary | ICD-10-CM | POA: Diagnosis not present

## 2017-11-26 DIAGNOSIS — M25511 Pain in right shoulder: Secondary | ICD-10-CM | POA: Diagnosis not present

## 2017-11-26 DIAGNOSIS — G4733 Obstructive sleep apnea (adult) (pediatric): Secondary | ICD-10-CM | POA: Diagnosis not present

## 2017-11-26 DIAGNOSIS — M5020 Other cervical disc displacement, unspecified cervical region: Secondary | ICD-10-CM | POA: Diagnosis not present

## 2017-11-27 ENCOUNTER — Ambulatory Visit: Payer: PPO | Admitting: Physical Therapy

## 2017-11-27 ENCOUNTER — Encounter: Payer: Self-pay | Admitting: Physical Therapy

## 2017-11-27 DIAGNOSIS — M6281 Muscle weakness (generalized): Secondary | ICD-10-CM

## 2017-11-27 DIAGNOSIS — M25572 Pain in left ankle and joints of left foot: Secondary | ICD-10-CM | POA: Diagnosis not present

## 2017-11-27 DIAGNOSIS — R262 Difficulty in walking, not elsewhere classified: Secondary | ICD-10-CM

## 2017-11-27 NOTE — Therapy (Signed)
[facility] [address], [name], [address] Phone: [phone]   Fax:  [phone]  Physical Therapy Treatment  Patient Details  Name: Laura Hale MRN: [id number] Date of Birth: [birth year] Referring Provider: Samara Deist DPM   Encounter Date: 11/27/2017  PT End of Session - 11/27/17 0904    Visit Number  19    Number of Visits  24    Date for PT Re-Evaluation  12/11/17    PT Start Time  0900    PT Stop Time  0948    PT Time Calculation (min)  48 min    Activity Tolerance  Patient tolerated treatment well;Patient limited by pain    Behavior During Therapy  [facility] for tasks assessed/performed       Past Medical History:  Diagnosis Date  . Arthritis    elbow, hands, neck   . Asthma    allergy induced asthma-only exacerbated by smells like perfume, smoke etc  . Atypical childhood psychosis 2007   myoview -negative for ischemia with preserved LV function  . Colon polyps   . Complication of anesthesia    WITH ACDF IN JUNE 2016-PT STATES SHE LOST HER VOICE X 10 WEEKS DUE TO INTUBATION  . COPD (chronic obstructive pulmonary disease) ([place])   . Edema    LEGS/FEET  . Hypercalcemia 2009   calcium  WNL 10.2 on 04-2015  . Hyperlipidemia   . Obesity   . Urinary incontinence     Past Surgical History:  Procedure Laterality Date  . ABDOMINAL HYSTERECTOMY    . ANTERIOR CERVICAL DECOMP/DISCECTOMY FUSION N/A 01/31/2015   Procedure: ANTERIOR CERVICAL DECOMPRESSION/DISCECTOMY FUSION CERVICAL FIVE SIX;  Surgeon: Karie Chimera, MD;  Location: [place] [facility];  Service: Neurosurgery;  Laterality: N/A;  . BACK SURGERY    . CATARACT EXTRACTION W/PHACO Left 02/06/2017   Procedure: CATARACT EXTRACTION PHACO AND INTRAOCULAR LENS PLACEMENT (IOC);  Surgeon: Eulogio Bear, MD;  Location: [facility];  Service: Ophthalmology;  Laterality: Left;  Lot# [id number] H [place]: 00:37.9 AP%: 7.9 CDE: 2.97  . CATARACT EXTRACTION W/PHACO  Right 03/06/2017   Procedure: CATARACT EXTRACTION PHACO AND INTRAOCULAR LENS PLACEMENT (IOC);  Surgeon: Eulogio Bear, MD;  Location: [facility];  Service: Ophthalmology;  Laterality: Right;  Lot #[id number] H [place]: 00.22.0 AP%: 7.5 CDE: 1.64   . COLONOSCOPY    . COLONOSCOPY WITH PROPOFOL N/A 09/23/2017   Procedure: COLONOSCOPY WITH PROPOFOL;  Surgeon: Toledo, Benay Pike, MD;  Location: [facility];  Service: Gastroenterology;  Laterality: N/A;  . ELBOW SURGERY Left 04/2003   x2  & for repair & then remove all hardware, due to injury from a fall  . EYE SURGERY    . FRACTURE SURGERY    . HAND SURGERY Right 2006   injury- then eventually lost remainder of thumb  . KNEE ARTHROSCOPY WITH LATERAL MENISECTOMY Left 01/23/2016   Procedure: KNEE ARTHROSCOPY WITH LATERAL MENISECTOMY;  Surgeon: Corky Mull, MD;  Location: [facility];  Service: Orthopedics;  Laterality: Left;  . ORIF HUMERUS FRACTURE Right 08/31/2015   Procedure: OPEN REDUCTION INTERNAL FIXATION (ORIF) PROXIMAL HUMERUS FRACTURE w/ #2 fberwire;  Surgeon: Corky Mull, MD;  Location: [facility];  Service: Orthopedics;  Laterality: Right;  . 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  Subjective Assessment - 11/27/17 0901    Subjective  Patient reports she is doing well with less heel pain and would  like to know the name of the medication she is receiving for her ankle so she can forward it to her MD.     Pertinent History  History of pain left heel/ankle x 1 year that has worsened and she is not unable to stand or walk without severe difficulty. She is limited in walking, standing activities which interferes with her daily like at home and in community.     Limitations  Standing;Walking;House hold activities;Other (comment) recreational activities, stairs    How long can you stand comfortably?  <30 min.    How long can you walk comfortably?  <60 min.    Diagnostic tests  X ray: calcaneal spur left ankle,  cacification in Achilles tendon    Patient Stated Goals  Patient would like to walk, stand and return to normal activties without pain or swelling left ankle.    Currently in Pain?  Yes    Pain Score  2     Pain Location  Ankle    Pain Orientation  Left    Pain Descriptors / Indicators  Aching;Tightness    Pain Type  Chronic pain    Pain Onset  More than a month ago    Pain Frequency  Intermittent           Objective: Left ankle/heel: mild swelling and warmth palpable posterior heel central and medial Palpation: left calf with palpable spasms along lateral and medial aspect, plantar aspect left foot with decreased soft tissue elasticity medial aspect   Treatment:   Manual therapy: 12 min; goal: improved soft tissue elasticity Patient prone with LE's supported: STM to calf medial > lateral superficial and deep techniques with improved elasticity, decreased spasms    Modalities:  Ultrasound:  1 MHz 50% pulsed and continuous 1.2 w/cm2 x 8 min. to posterior aspect left ankle/ Achilles tendon with patient prone with left LE supported on pillow goal: pain, inflammation, swelling   Iontophoresis with dexamethasone 75m/ml applied 460mmin using medium sized electrode to posterior aspect of left foot/heel over swollen area (more medial ) with patient prone ying; goal: pain, inflammation; no adverse reactions noted (application 8 miNIO)(~[id number] application)   High volt estim x 15 min. (2) electrodes applied to left calf over spasms medial and lateral with patient prone with left LE supported on pillow, intensity to tolerance: goal: pain    Patient response to treatment: improved soft tissue elasticity up to 50% with decreased tenderness and spasm. No adverse reactions to iontophoresis noted.      PT Education - 11/27/17 [id number]  Education provided  Yes    Education Details  educated in use of dexamethasone     Person(s) Educated  Patient    Methods  Explanation     Comprehension  Verbalized understanding          PT Long Term Goals - 10/30/17 1001      PT LONG TERM GOAL #1   Title  Patient will demonstrate improved function with daily tasks as indicated by LEFS score of 40/80     Baseline  LEFS 27/80; LEFS 10/30/17 30/80    Status  Partially Met      PT LONG TERM GOAL #2   Title  improved FADI score to 40% or better indcating improved function with standing, going down steps, walking, standing activties    Baseline  FADI 56% :FADI 10/30/16 52%    Status  Not Met    Target Date  12/11/17  PT LONG TERM GOAL #3   Title  improved FADI score to 30% or less demonstrating improved function with daily tasks involving standing, walking    Baseline  FADI 56%    Status  Revised    Target Date  12/11/17      PT LONG TERM GOAL #4   Title  patient will be indepenent with self management of pain, home program for ROM, strength by discharge to continue progressing towards prior level of function.     Baseline  limited knowledge of appropriate pain control strategies, exercises and progression without assistance, guidance and cuing    Status  On-going    Target Date  12/11/17            Plan - 11/27/17 0943    Clinical Impression Statement  Patient continues with steady progress towards goals with improvement in ability to walk and wear shoes with backs occasionally. She continues with swelling and increased warmth in her left heel and limited ability to walk for prolonged periods with closed back shoes. She continues to respond well to current treatment.     Rehab Potential  Fair    Clinical Impairments Affecting Rehab Potential  (+)motivated, prior level of function (-)chronic condition, X ray report calcaneal spur with cacifications in tendon    PT Frequency  2x / week    PT Duration  6 weeks    PT Treatment/Interventions  Neuromuscular re-education;Manual techniques;Ultrasound;Electrical Stimulation;Iontophoresis 22m/ml Dexamethasone;Moist  Heat;Therapeutic exercise;Therapeutic activities;Patient/family education    PT Next Visit Plan  pain control, iointo, [place] estim., therapeutic exercise, manual techniques, iontophoresis    PT Home Exercise Plan  ice massage and ROM , weight shifting as able       Patient will benefit from skilled therapeutic intervention in order to improve the following deficits and impairments:  Abnormal gait, Pain, Decreased activity tolerance, Decreased range of motion, Decreased strength, Difficulty walking, Obesity  Visit Diagnosis: Pain in left ankle and joints of left foot  Muscle weakness (generalized)  Difficulty in walking, not elsewhere classified     Problem List Patient Active Problem List   Diagnosis Date Noted  . 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    BJomarie LongsPT 11/27/2017, 11:35 AM  [facility] [address] [place] [address]Phone: [phone]  Fax:  [phone] Name: LMILY MALECKIMRN: [id number] of Birth: 4Sep 01, 1947

## 2017-12-01 ENCOUNTER — Ambulatory Visit: Payer: PPO | Admitting: Physical Therapy

## 2017-12-01 ENCOUNTER — Encounter: Payer: Self-pay | Admitting: Physical Therapy

## 2017-12-01 DIAGNOSIS — M6281 Muscle weakness (generalized): Secondary | ICD-10-CM

## 2017-12-01 DIAGNOSIS — M25572 Pain in left ankle and joints of left foot: Secondary | ICD-10-CM | POA: Diagnosis not present

## 2017-12-01 DIAGNOSIS — R262 Difficulty in walking, not elsewhere classified: Secondary | ICD-10-CM

## 2017-12-01 NOTE — Therapy (Signed)
[facility] [address] [name], [address] Phone: [phone]   Fax:  [phone]  Physical Therapy Treatment/Progress Note  Patient Details  Name: Laura Hale MRN: [id number] Date of Birth: 1946/07/10 Referring Provider: Samara Deist DPM   Encounter Date: 12/01/2017      Dates of reporting period  10/30/2017  to  12/01/2017   PT End of Session - 12/01/17 0932    Visit Number  20    Number of Visits  24    Date for PT Re-Evaluation  12/11/17    Authorization Type  10 of 10 progress    PT Start Time  0930    PT Stop Time  1018    PT Time Calculation (min)  48 min    Activity Tolerance  Patient tolerated treatment well;Patient limited by pain    Behavior During Therapy  [facility] for tasks assessed/performed       Past Medical History:  Diagnosis Date  . Arthritis    elbow, hands, neck   . Asthma    allergy induced asthma-only exacerbated by smells like perfume, smoke etc  . Atypical childhood psychosis 2007   myoview -negative for ischemia with preserved LV function  . Colon polyps   . Complication of anesthesia    WITH ACDF IN JUNE 2016-PT STATES SHE LOST HER VOICE X 10 WEEKS DUE TO INTUBATION  . COPD (chronic obstructive pulmonary disease) ([place])   . Edema    LEGS/FEET  . Hypercalcemia 2009   calcium  WNL 10.2 on 04-2015  . Hyperlipidemia   . Obesity   . Urinary incontinence     Past Surgical History:  Procedure Laterality Date  . ABDOMINAL HYSTERECTOMY    . ANTERIOR CERVICAL DECOMP/DISCECTOMY FUSION N/A 01/31/2015   Procedure: ANTERIOR CERVICAL DECOMPRESSION/DISCECTOMY FUSION CERVICAL FIVE SIX;  Surgeon: Karie Chimera, MD;  Location: [facility];  Service: Neurosurgery;  Laterality: N/A;  . BACK SURGERY    . CATARACT EXTRACTION W/PHACO Left 02/06/2017   Procedure: CATARACT EXTRACTION PHACO AND INTRAOCULAR LENS PLACEMENT (IOC);  Surgeon: Eulogio Bear, MD;  Location: [facility];   Service: Ophthalmology;  Laterality: Left;  Lot# [id number] [place]: 00:37.9 AP%: 7.9 CDE: 2.97  . CATARACT EXTRACTION W/PHACO Right 03/06/2017   Procedure: CATARACT EXTRACTION PHACO AND INTRAOCULAR LENS PLACEMENT (IOC);  Surgeon: Eulogio Bear, MD;  Location: [facility];  Service: Ophthalmology;  Laterality: Right;  Lot #[id number] H [place]: 00.22.0 AP%: 7.5 CDE: 1.64   . COLONOSCOPY    . COLONOSCOPY WITH PROPOFOL N/A 09/23/2017   Procedure: COLONOSCOPY WITH PROPOFOL;  Surgeon: Toledo, Benay Pike, MD;  Location: [facility];  Service: Gastroenterology;  Laterality: N/A;  . ELBOW SURGERY Left 04/2003   x2  & for repair & then remove all hardware, due to injury from a fall  . EYE SURGERY    . FRACTURE SURGERY    . HAND SURGERY Right 2006   injury- then eventually lost remainder of thumb  . KNEE ARTHROSCOPY WITH LATERAL MENISECTOMY Left 01/23/2016   Procedure: KNEE ARTHROSCOPY WITH LATERAL MENISECTOMY;  Surgeon: Corky Mull, MD;  Location: [facility];  Service: Orthopedics;  Laterality: Left;  . ORIF HUMERUS FRACTURE Right 08/31/2015   Procedure: OPEN REDUCTION INTERNAL FIXATION (ORIF) PROXIMAL HUMERUS FRACTURE w/ #2 fberwire;  Surgeon: Corky Mull, MD;  Location: [facility];  Service: Orthopedics;  Laterality: Right;  . THUMB AMPUTATION  2005   partial    There were no vitals  filed for this visit.  Subjective Assessment - 12/01/17 0931    Subjective  Patient reports she is continuing to see improvement with pain relief. She still cannot wear closed back shoes due to irritation on back of heel (she wore tennis shoes over the weekend and felt increased pain/irritation) chief concern is tightness in heel and she would like to improve her gait pattern, heel/toe and feel more confidence with balance.      Pertinent History  History of pain left heel/ankle x 1 year that has worsened and she is not unable to stand or walk without severe difficulty. She is limited in walking, standing activities which  interferes with her daily like at home and in community.     Limitations  Standing;Walking;House hold activities;Other (comment) recreational activities, stairs    How long can you stand comfortably?  <30 min.    How long can you walk comfortably?  <60 min.    Diagnostic tests  X ray: calcaneal spur left ankle, cacification in Achilles tendon    Patient Stated Goals  Patient would like to walk, stand and return to normal activties without pain or swelling left ankle.    Currently in Pain?  Yes    Pain Score  2     Pain Location  Ankle    Pain Orientation  Left    Pain Descriptors / Indicators  Tightness;Aching    Pain Type  Chronic pain    Pain Onset  More than a month ago    Pain Frequency  Intermittent          Objective: Left ankle/heel: mild swelling posterior heel centrally Palpation: left calf with palpable spasms along lateral aspect, plantar aspect left foot with decreased soft tissue elasticity medial aspect, improved from previous session LEFS: 24/80  FADI: 34% (was 56%) demonstrating significant improvement with self perceived impairment with left foot/heel with daily tasks   Treatment:   Manual therapy: 10 min; goal: improved soft tissue elasticity Patient prone with LE's supported: STM to calf lateral >medial aspect and left foot plantar fascia superficial and deep techniques with improved elasticity, decreased spasms    Modalities:  Ultrasound:  1 MHz 50% continuous 1.2 w/cm2 x 8 min. to posterior aspect left ankle/ Achilles tendon with patient prone with left LE supported on pillow goal: pain, inflammation, swelling   Iontophoresis with dexamethasone 8m/ml applied 469mmin using medium sized electrode to posterior aspect of left foot/heel over swollen area (more medial ) with patient prone ying; goal: pain, inflammation; no adverse reactions noted (application 8 miWUX)(~32GMWNUUVollowing application)   High volt estim x 15 min. (2) electrodes applied to left calf  over spasms lateral apect with patient prone with left LE supported on pillow, intensity to tolerance: goal: pain    Patient response to treatment: improved soft tissue elasticity and decreased spasms 50% with treatment. no adverse reactions noted with iontophoresis. patient verbalized good understanding of home exercises to improve balance and gait pattern. Patient reported decreased tightness in left ankle/heel at end of session with improved gait pattern, heel /toe.     PT Education - 12/01/17 1024    Education provided  Yes    Education Details  exercises re assessed for balance (Can Do balance stone exercises) and walking backwards to improve gait and balance    Person(s) Educated  Patient    Methods  Demonstration;Explanation    Comprehension  Verbalized understanding          PT Long Term Goals -  12/01/17 1013      PT LONG TERM GOAL #1   Title  Patient will demonstrate improved function with daily tasks as indicated by LEFS score of 40/80     Baseline  LEFS 27/80; LEFS 10/30/17 30/80; 12/01/2017 24/80 (due to knee problems)    Status  Not Met      PT LONG TERM GOAL #2   Title  improved FADI score to 40% or better indcating improved function with standing, going down steps, walking, standing activties    Baseline  FADI 56% :FADI 10/30/16 52%; FADI 12/01/2017 34%    Status  Achieved      PT LONG TERM GOAL #3   Title  improved FADI score to 30% or less demonstrating improved function with daily tasks involving standing, walking    Baseline  FADI 56%; 12/01/2017 FADI 34%    Status  On-going    Target Date  12/11/17      PT LONG TERM GOAL #4   Title  patient will be indepenent with self management of pain, home program for ROM, strength by discharge to continue progressing towards prior level of function.     Baseline  limited knowledge of appropriate pain control strategies, exercises and progression without assistance, guidance and cuing    Status  On-going    Target Date   11/11/17            Plan - 12/01/17 1025    Clinical Impression Statement  Patient continues steady progress towards goals with improved FADI score of 34% from 56% self perceived impairment. She is still having difficutly with wearing shoes with closed backs due to irritation on heel. She continues to respond well to current treatmnet and will benefit from continued physical therapy intervention to achieve goals.     Rehab Potential  Fair    Clinical Impairments Affecting Rehab Potential  (+)motivated, prior level of function (-)chronic condition, X ray report calcaneal spur with cacifications in tendon    PT Frequency  2x / week    PT Duration  6 weeks    PT Treatment/Interventions  Neuromuscular re-education;Manual techniques;Ultrasound;Electrical Stimulation;Iontophoresis 78m/ml Dexamethasone;Moist Heat;Therapeutic exercise;Therapeutic activities;Patient/family education    PT Next Visit Plan  pain control, iointo, [place] estim., therapeutic exercise, manual techniques, iontophoresis    PT Home Exercise Plan  ice massage and ROM , weight shifting as able       Patient will benefit from skilled therapeutic intervention in order to improve the following deficits and impairments:  Abnormal gait, Pain, Decreased activity tolerance, Decreased range of motion, Decreased strength, Difficulty walking, Obesity  Visit Diagnosis: Pain in left ankle and joints of left foot  Muscle weakness (generalized)  Difficulty in walking, not elsewhere classified     Problem List Patient Active Problem List   Diagnosis Date Noted  . 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    BJomarie LongsPT 12/01/2017, 10:29 AM  [facility] [address] [place] [address]Phone: [phone]  Fax:  [phone] Name: Laura LOOMANMRN: [id number] of Birth: [birth year]

## 2017-12-04 ENCOUNTER — Encounter: Payer: Self-pay | Admitting: Physical Therapy

## 2017-12-04 ENCOUNTER — Ambulatory Visit: Payer: PPO | Admitting: Physical Therapy

## 2017-12-04 DIAGNOSIS — R262 Difficulty in walking, not elsewhere classified: Secondary | ICD-10-CM

## 2017-12-04 DIAGNOSIS — M25572 Pain in left ankle and joints of left foot: Secondary | ICD-10-CM

## 2017-12-04 DIAGNOSIS — M6281 Muscle weakness (generalized): Secondary | ICD-10-CM

## 2017-12-05 NOTE — Therapy (Signed)
[facility] [address], [name], [address] Phone: [phone]   Fax:  [phone]  Physical Therapy Treatment  Patient Details  Name: Laura Hale MRN: [id number] Date of Birth: [birth year] Referring Provider: Samara Deist DPM   Encounter Date: 12/04/2017  PT End of Session - 12/04/17 0943    Visit Number  21    Number of Visits  24    Date for PT Re-Evaluation  12/11/17    Authorization Type  11 of 20 progress    PT Start Time  0900    PT Stop Time  0947    PT Time Calculation (min)  47 min    Activity Tolerance  Patient tolerated treatment well;Patient limited by pain    Behavior During Therapy  [facility] for tasks assessed/performed       Past Medical History:  Diagnosis Date  . Arthritis    elbow, hands, neck   . Asthma    allergy induced asthma-only exacerbated by smells like perfume, smoke etc  . Atypical childhood psychosis 2007   myoview -negative for ischemia with preserved LV function  . Colon polyps   . Complication of anesthesia    WITH ACDF IN JUNE 2016-PT STATES SHE LOST HER VOICE X 10 WEEKS DUE TO INTUBATION  . COPD (chronic obstructive pulmonary disease) ([place])   . Edema    LEGS/FEET  . Hypercalcemia 2009   calcium  WNL 10.2 on 04-2015  . Hyperlipidemia   . Obesity   . Urinary incontinence     Past Surgical History:  Procedure Laterality Date  . ABDOMINAL HYSTERECTOMY    . ANTERIOR CERVICAL DECOMP/DISCECTOMY FUSION N/A 01/31/2015   Procedure: ANTERIOR CERVICAL DECOMPRESSION/DISCECTOMY FUSION CERVICAL FIVE SIX;  Surgeon: Karie Chimera, MD;  Location: [place] [facility];  Service: Neurosurgery;  Laterality: N/A;  . BACK SURGERY    . CATARACT EXTRACTION W/PHACO Left 02/06/2017   Procedure: CATARACT EXTRACTION PHACO AND INTRAOCULAR LENS PLACEMENT (IOC);  Surgeon: Eulogio Bear, MD;  Location: [facility];  Service: Ophthalmology;  Laterality: Left;  Lot# [id number] H [place]: 00:37.9 AP%:  7.9 CDE: 2.97  . CATARACT EXTRACTION W/PHACO Right 03/06/2017   Procedure: CATARACT EXTRACTION PHACO AND INTRAOCULAR LENS PLACEMENT (IOC);  Surgeon: Eulogio Bear, MD;  Location: [facility];  Service: Ophthalmology;  Laterality: Right;  Lot #[id number] H [place]: 00.22.0 AP%: 7.5 CDE: 1.64   . COLONOSCOPY    . COLONOSCOPY WITH PROPOFOL N/A 09/23/2017   Procedure: COLONOSCOPY WITH PROPOFOL;  Surgeon: Toledo, Benay Pike, MD;  Location: [facility];  Service: Gastroenterology;  Laterality: N/A;  . ELBOW SURGERY Left 04/2003   x2  & for repair & then remove all hardware, due to injury from a fall  . EYE SURGERY    . FRACTURE SURGERY    . HAND SURGERY Right 2006   injury- then eventually lost remainder of thumb  . KNEE ARTHROSCOPY WITH LATERAL MENISECTOMY Left 01/23/2016   Procedure: KNEE ARTHROSCOPY WITH LATERAL MENISECTOMY;  Surgeon: Corky Mull, MD;  Location: [facility];  Service: Orthopedics;  Laterality: Left;  . ORIF HUMERUS FRACTURE Right 08/31/2015   Procedure: OPEN REDUCTION INTERNAL FIXATION (ORIF) PROXIMAL HUMERUS FRACTURE w/ #2 fberwire;  Surgeon: Corky Mull, MD;  Location: [facility];  Service: Orthopedics;  Laterality: Right;  . 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  Subjective Assessment - 12/04/17 0941    Subjective  Patient reports  she is continuing to see improvement with left heel pain and function.     Pertinent History  History of pain left heel/ankle x 1 year that has worsened and she is not unable to stand or walk without severe difficulty. She is limited in walking, standing activities which interferes with her daily like at home and in community.     Limitations  Standing;Walking;House hold activities;Other (comment) recreational activities, stairs    How long can you stand comfortably?  <30 min.    How long can you walk comfortably?  <60 min.    Diagnostic tests  X ray: calcaneal spur left ankle, cacification in Achilles tendon     Patient Stated Goals  Patient would like to walk, stand and return to normal activties without pain or swelling left ankle.    Currently in Pain?  Yes    Pain Score  2     Pain Location  Ankle    Pain Orientation  Left    Pain Descriptors / Indicators  Aching;Tightness    Pain Onset  More than a month ago    Pain Frequency  Intermittent          Objective: Left ankle/heel: mild swelling posterior heel centrally Palpation: left calf with palpable spasms along lateral aspect, proximal calf, plantar aspect left foot with decreased soft tissue elasticity medial aspect, improved from previous session   Treatment:   Manual therapy: 10 min; goal: improved soft tissue elasticity Patient prone with LE's supported: STM to calf lateral >medial aspect and left foot plantar fascia superficial and deep techniques with improved elasticity, decreased spasms    Modalities:  Ultrasound:  1 MHz 50% 1.2 w/cm2 x 10 min. to posterior aspect left ankle/ Achilles tendon with patient prone with left LE supported on pillow goal: pain, inflammation, swelling   Iontophoresis with dexamethasone 46m/ml applied 462mmin using medium sized electrode to posterior aspect of left foot/heel over swollen area centrally with patient prone ying; goal: pain, inflammation; no adverse reactions noted (application 8 miKDT)(~[id number] application)   High volt estim x 15 min. (2) electrodes applied to left calf over spasms proximally with patient prone with left LE supported on pillow, intensity to tolerance: goal: pain    Patient response to treatment: improved soft tissue elasticity with dereased tenderness, spasms by 50% following treatment. patient reported improved flexibiltiy in calf with walking at end of session and mild to no tightness in heel.    PT Education - 12/04/17 [id number]  Education provided  Yes    Education Details  continue to perform stretching, ROM exercises    Person(s) Educated  Patient     Methods  Explanation;Demonstration    Comprehension  Verbalized understanding          PT Long Term Goals - 12/01/17 1013      PT LONG TERM GOAL #1   Title  Patient will demonstrate improved function with daily tasks as indicated by LEFS score of 40/80     Baseline  LEFS 27/80; LEFS 10/30/17 30/80; 12/01/2017 24/80 (due to knee problems)    Status  Not Met      PT LONG TERM GOAL #2   Title  improved FADI score to 40% or better indcating improved function with standing, going down steps, walking, standing activties    Baseline  FADI 56% :FADI 10/30/16 52%; FADI 12/01/2017 34%    Status  Achieved      PT LONG TERM GOAL #3   Title  improved FADI score to 30% or less demonstrating improved function with daily tasks involving standing, walking    Baseline  FADI 56%; 12/01/2017 FADI 34%    Status  On-going    Target Date  12/11/17      PT LONG TERM GOAL #4   Title  patient will be indepenent with self management of pain, home program for ROM, strength by discharge to continue progressing towards prior level of function.     Baseline  limited knowledge of appropriate pain control strategies, exercises and progression without assistance, guidance and cuing    Status  On-going    Target Date  11/11/17            Plan - 12/04/17 0947    Clinical Impression Statement  Patient is progressing well towards goals with decreasing pain, warmth and improving soft tissue elasticity in left heel. She is transitioning to wearing shoewear with backs occasionally with mild discomfort. She continues to benefit from physical therapy intervention to further decrease swelling and pain.     Rehab Potential  Fair    Clinical Impairments Affecting Rehab Potential  (+)motivated, prior level of function (-)chronic condition, X ray report calcaneal spur with cacifications in tendon    PT Frequency  2x / week    PT Duration  6 weeks    PT Treatment/Interventions  Neuromuscular re-education;Manual  techniques;Ultrasound;Electrical Stimulation;Iontophoresis 16m/ml Dexamethasone;Moist Heat;Therapeutic exercise;Therapeutic activities;Patient/family education    PT Next Visit Plan  pain control, iointo, [place] estim., therapeutic exercise, manual techniques, iontophoresis    PT Home Exercise Plan  ice massage and ROM , weight shifting as able       Patient will benefit from skilled therapeutic intervention in order to improve the following deficits and impairments:  Abnormal gait, Pain, Decreased activity tolerance, Decreased range of motion, Decreased strength, Difficulty walking, Obesity  Visit Diagnosis: Pain in left ankle and joints of left foot  Muscle weakness (generalized)  Difficulty in walking, not elsewhere classified     Problem List Patient Active Problem List   Diagnosis Date Noted  . 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    BJomarie LongsPT 12/05/2017, 8:34 AM  [facility] [address] [place] [address]Phone: [phone]  Fax:  [phone] Name: Laura ARAGONESMRN: [id number] of Birth: [birth year]

## 2017-12-08 ENCOUNTER — Encounter: Payer: Self-pay | Admitting: Physical Therapy

## 2017-12-08 ENCOUNTER — Ambulatory Visit: Payer: PPO | Admitting: Physical Therapy

## 2017-12-08 DIAGNOSIS — R262 Difficulty in walking, not elsewhere classified: Secondary | ICD-10-CM

## 2017-12-08 DIAGNOSIS — M25572 Pain in left ankle and joints of left foot: Secondary | ICD-10-CM

## 2017-12-08 DIAGNOSIS — M6281 Muscle weakness (generalized): Secondary | ICD-10-CM

## 2017-12-08 NOTE — Therapy (Signed)
[facility] [address] [name], [address] Phone: [phone]   Fax:  [phone]  Physical Therapy Treatment  Patient Details  Name: Laura Hale MRN: [id number] Date of Birth: 23-Jul-1946 Referring Provider: Samara Deist DPM   Encounter Date: 12/08/2017  PT End of Session - 12/08/17 0859    Visit Number  22    Number of Visits  24    Date for PT Re-Evaluation  12/11/17    Authorization Type  12 of 20 progress    PT Start Time  0855    PT Stop Time  0945    PT Time Calculation (min)  50 min    Activity Tolerance  Patient tolerated treatment well    Behavior During Therapy  [facility] for tasks assessed/performed       Past Medical History:  Diagnosis Date  . Arthritis    elbow, hands, neck   . Asthma    allergy induced asthma-only exacerbated by smells like perfume, smoke etc  . Atypical childhood psychosis 2007   myoview -negative for ischemia with preserved LV function  . Colon polyps   . Complication of anesthesia    WITH ACDF IN JUNE 2016-PT STATES SHE LOST HER VOICE X 10 WEEKS DUE TO INTUBATION  . COPD (chronic obstructive pulmonary disease) ([place])   . Edema    LEGS/FEET  . Hypercalcemia 2009   calcium  WNL 10.2 on 04-2015  . Hyperlipidemia   . Obesity   . Urinary incontinence     Past Surgical History:  Procedure Laterality Date  . ABDOMINAL HYSTERECTOMY    . ANTERIOR CERVICAL DECOMP/DISCECTOMY FUSION N/A 01/31/2015   Procedure: ANTERIOR CERVICAL DECOMPRESSION/DISCECTOMY FUSION CERVICAL FIVE SIX;  Surgeon: Karie Chimera, MD;  Location: [place] [facility];  Service: Neurosurgery;  Laterality: N/A;  . BACK SURGERY    . CATARACT EXTRACTION W/PHACO Left 02/06/2017   Procedure: CATARACT EXTRACTION PHACO AND INTRAOCULAR LENS PLACEMENT (IOC);  Surgeon: Eulogio Bear, MD;  Location: [facility];  Service: Ophthalmology;  Laterality: Left;  Lot# [id number] H [place]: 00:37.9 AP%: 7.9 CDE: 2.97  . CATARACT  EXTRACTION W/PHACO Right 03/06/2017   Procedure: CATARACT EXTRACTION PHACO AND INTRAOCULAR LENS PLACEMENT (IOC);  Surgeon: Eulogio Bear, MD;  Location: [facility];  Service: Ophthalmology;  Laterality: Right;  Lot #[id number] H [place]: 00.22.0 AP%: 7.5 CDE: 1.64   . COLONOSCOPY    . COLONOSCOPY WITH PROPOFOL N/A 09/23/2017   Procedure: COLONOSCOPY WITH PROPOFOL;  Surgeon: Toledo, Benay Pike, MD;  Location: [facility];  Service: Gastroenterology;  Laterality: N/A;  . ELBOW SURGERY Left 04/2003   x2  & for repair & then remove all hardware, due to injury from a fall  . EYE SURGERY    . FRACTURE SURGERY    . HAND SURGERY Right 2006   injury- then eventually lost remainder of thumb  . KNEE ARTHROSCOPY WITH LATERAL MENISECTOMY Left 01/23/2016   Procedure: KNEE ARTHROSCOPY WITH LATERAL MENISECTOMY;  Surgeon: Corky Mull, MD;  Location: [facility];  Service: Orthopedics;  Laterality: Left;  . ORIF HUMERUS FRACTURE Right 08/31/2015   Procedure: OPEN REDUCTION INTERNAL FIXATION (ORIF) PROXIMAL HUMERUS FRACTURE w/ #2 fberwire;  Surgeon: Corky Mull, MD;  Location: [facility];  Service: Orthopedics;  Laterality: Right;  . 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  Subjective Assessment - 12/08/17 0857    Subjective  Patient continues to see improvement  with function and continues with tightness in her heel.     Pertinent History  History of pain left heel/ankle x 1 year that has worsened and she is not unable to stand or walk without severe difficulty. She is limited in walking, standing activities which interferes with her daily like at home and in community.     Limitations  Standing;Walking;House hold activities;Other (comment) recreational activities, stairs    How long can you stand comfortably?  <30 min.    How long can you walk comfortably?  <60 min.    Diagnostic tests  X ray: calcaneal spur left ankle, cacification in Achilles tendon    Patient Stated Goals   Patient would like to walk, stand and return to normal activties without pain or swelling left ankle.    Currently in Pain?  Yes    Pain Score  2     Pain Location  Ankle    Pain Orientation  Left    Pain Descriptors / Indicators  Aching;Tightness    Pain Type  Chronic pain    Pain Onset  More than a month ago    Pain Frequency  Intermittent         Objective: Left ankle/heel: mild swelling posterior heel centrally Palpation: mild spasms with decreased soft tissue mobility lateral calf and proximal attachment of muscles left calf; mild increased warmth noted medial aspect of heel, at least 50% less than previous session.   Treatment:   Manual therapy: 10 min; goal: improved soft tissue elasticity Patient prone with LE's supported: STM to calf lateral >medial aspect and left foot plantar fascia superficial and deep techniques with improved elasticity, decreased spasms    Modalities:  Ultrasound:  1 MHz 50% 1.2 w/cm2 x 10 min. to posterior aspect left ankle/ Achilles tendon with patient prone with left LE supported on pillow goal: pain, inflammation, swelling   Iontophoresis with dexamethasone 84m/ml applied 499mmin using medium sized electrode to posterior aspect of left foot/heel over swollen area centrally with patient prone ying; goal: pain, inflammation; no adverse reactions noted (application 8 miSHF)(~[id number] application)   High volt estim x 15 min. (2) electrodes applied to left calf over spasms proximally with patient prone with left LE supported on pillow, intensity to tolerance: goal: pain    Patient response to treatment: improved soft tissue elasticity, decreased spasms >50% in calf and plantar aspect of left foot/LE and improved ability to walk with less feeling of tighness in heel at end of session.     PT Education - 12/08/17 0858    Education provided  Yes    Education Details  HEP re assessed     Person(s) Educated  Patient    Methods  Explanation     Comprehension  Verbalized understanding          PT Long Term Goals - 12/01/17 1013      PT LONG TERM GOAL #1   Title  Patient will demonstrate improved function with daily tasks as indicated by LEFS score of 40/80     Baseline  LEFS 27/80; LEFS 10/30/17 30/80; 12/01/2017 24/80 (due to knee problems)    Status  Not Met      PT LONG TERM GOAL #2   Title  improved FADI score to 40% or better indcating improved function with standing, going down steps, walking, standing activties    Baseline  FADI 56% :FADI 10/30/16 52%; FADI 12/01/2017 34%    Status  Achieved  PT LONG TERM GOAL #3   Title  improved FADI score to 30% or less demonstrating improved function with daily tasks involving standing, walking    Baseline  FADI 56%; 12/01/2017 FADI 34%    Status  On-going    Target Date  12/11/17      PT LONG TERM GOAL #4   Title  patient will be indepenent with self management of pain, home program for ROM, strength by discharge to continue progressing towards prior level of function.     Baseline  limited knowledge of appropriate pain control strategies, exercises and progression without assistance, guidance and cuing    Status  On-going    Target Date  11/11/17            Plan - 12/08/17 0951    Clinical Impression Statement  Patient is progressing well towards goals with decreaeing pain, improvement with walking with closed back shoes. She continues with pain, mild warmth and swelling in left heel.  She is responding well to current treatment and will benefit from additional physical therapy intervention to achieve goals.     Rehab Potential  Fair    Clinical Impairments Affecting Rehab Potential  (+)motivated, prior level of function (-)chronic condition, X ray report calcaneal spur with cacifications in tendon    PT Frequency  2x / week    PT Duration  6 weeks    PT Treatment/Interventions  Neuromuscular re-education;Manual techniques;Ultrasound;Electrical  Stimulation;Iontophoresis 38m/ml Dexamethasone;Moist Heat;Therapeutic exercise;Therapeutic activities;Patient/family education    PT Next Visit Plan  pain control, iointo, [place] estim., therapeutic exercise, manual techniques, iontophoresis    PT Home Exercise Plan  ice massage and ROM , weight shifting as able       Patient will benefit from skilled therapeutic intervention in order to improve the following deficits and impairments:  Abnormal gait, Pain, Decreased activity tolerance, Decreased range of motion, Decreased strength, Difficulty walking, Obesity  Visit Diagnosis: Pain in left ankle and joints of left foot  Muscle weakness (generalized)  Difficulty in walking, not elsewhere classified     Problem List Patient Active Problem List   Diagnosis Date Noted  . 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    BJomarie LongsPT 12/08/2017, 10:43 AM  [facility] [address] [place] [address]Phone: [phone]  Fax:  [phone] Name: Laura STRICKERMRN: [id number] of Birth: 406-Aug-1947

## 2017-12-10 ENCOUNTER — Encounter: Payer: Self-pay | Admitting: Physical Therapy

## 2017-12-10 ENCOUNTER — Ambulatory Visit: Payer: PPO | Attending: Podiatry | Admitting: Physical Therapy

## 2017-12-10 DIAGNOSIS — R2689 Other abnormalities of gait and mobility: Secondary | ICD-10-CM | POA: Insufficient documentation

## 2017-12-10 DIAGNOSIS — M25572 Pain in left ankle and joints of left foot: Secondary | ICD-10-CM | POA: Diagnosis not present

## 2017-12-10 DIAGNOSIS — R262 Difficulty in walking, not elsewhere classified: Secondary | ICD-10-CM | POA: Diagnosis not present

## 2017-12-10 DIAGNOSIS — M6281 Muscle weakness (generalized): Secondary | ICD-10-CM | POA: Insufficient documentation

## 2017-12-10 NOTE — Therapy (Signed)
[facility] [address], [name], [address] Phone: [phone]   Fax:  [phone]  Physical Therapy Treatment  Patient Details  Name: Laura Hale MRN: [id number] Date of Birth: 1946/05/02 Referring Provider: Samara Deist DPM   Encounter Date: 12/10/2017  PT End of Session - 12/10/17 0937    Visit Number  23    Number of Visits  24    Date for PT Re-Evaluation  12/11/17    Authorization Type  13 of 20 progress    PT Start Time  0900    PT Stop Time  0950    PT Time Calculation (min)  50 min    Activity Tolerance  Patient tolerated treatment well    Behavior During Therapy  [facility] for tasks assessed/performed       Past Medical History:  Diagnosis Date  . Arthritis    elbow, hands, neck   . Asthma    allergy induced asthma-only exacerbated by smells like perfume, smoke etc  . Atypical childhood psychosis 2007   myoview -negative for ischemia with preserved LV function  . Colon polyps   . Complication of anesthesia    WITH ACDF IN JUNE 2016-PT STATES SHE LOST HER VOICE X 10 WEEKS DUE TO INTUBATION  . COPD (chronic obstructive pulmonary disease) ([place])   . Edema    LEGS/FEET  . Hypercalcemia 2009   calcium  WNL 10.2 on 04-2015  . Hyperlipidemia   . Obesity   . Urinary incontinence     Past Surgical History:  Procedure Laterality Date  . ABDOMINAL HYSTERECTOMY    . ANTERIOR CERVICAL DECOMP/DISCECTOMY FUSION N/A 01/31/2015   Procedure: ANTERIOR CERVICAL DECOMPRESSION/DISCECTOMY FUSION CERVICAL FIVE SIX;  Surgeon: Karie Chimera, MD;  Location: [place] [facility];  Service: Neurosurgery;  Laterality: N/A;  . BACK SURGERY    . CATARACT EXTRACTION W/PHACO Left 02/06/2017   Procedure: CATARACT EXTRACTION PHACO AND INTRAOCULAR LENS PLACEMENT (IOC);  Surgeon: Eulogio Bear, MD;  Location: [facility];  Service: Ophthalmology;  Laterality: Left;  Lot# [id number] H [place]: 00:37.9 AP%: 7.9 CDE: 2.97  . CATARACT  EXTRACTION W/PHACO Right 03/06/2017   Procedure: CATARACT EXTRACTION PHACO AND INTRAOCULAR LENS PLACEMENT (IOC);  Surgeon: Eulogio Bear, MD;  Location: [facility];  Service: Ophthalmology;  Laterality: Right;  Lot #[id number] H [place]: 00.22.0 AP%: 7.5 CDE: 1.64   . COLONOSCOPY    . COLONOSCOPY WITH PROPOFOL N/A 09/23/2017   Procedure: COLONOSCOPY WITH PROPOFOL;  Surgeon: Toledo, Benay Pike, MD;  Location: [facility];  Service: Gastroenterology;  Laterality: N/A;  . ELBOW SURGERY Left 04/2003   x2  & for repair & then remove all hardware, due to injury from a fall  . EYE SURGERY    . FRACTURE SURGERY    . HAND SURGERY Right 2006   injury- then eventually lost remainder of thumb  . KNEE ARTHROSCOPY WITH LATERAL MENISECTOMY Left 01/23/2016   Procedure: KNEE ARTHROSCOPY WITH LATERAL MENISECTOMY;  Surgeon: Corky Mull, MD;  Location: [facility];  Service: Orthopedics;  Laterality: Left;  . ORIF HUMERUS FRACTURE Right 08/31/2015   Procedure: OPEN REDUCTION INTERNAL FIXATION (ORIF) PROXIMAL HUMERUS FRACTURE w/ #2 fberwire;  Surgeon: Corky Mull, MD;  Location: [facility];  Service: Orthopedics;  Laterality: Right;  . 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  Subjective Assessment - 12/10/17 0934    Subjective  Patient reports tightness as chief  concern in left ankle and calf near knee. She is managing symptoms with stretching, ice, elevation and modified shoe wear with minimal use of shoes with backs.     Pertinent History  History of pain left heel/ankle x 1 year that has worsened and she is not unable to stand or walk without severe difficulty. She is limited in walking, standing activities which interferes with her daily like at home and in community.     Limitations  Standing;Walking;House hold activities;Other (comment) recreational activities, stairs    How long can you stand comfortably?  <30 min.    How long can you walk comfortably?  <60 min.     Diagnostic tests  X ray: calcaneal spur left ankle, cacification in Achilles tendon    Patient Stated Goals  Patient would like to walk, stand and return to normal activties without pain or swelling left ankle.    Currently in Pain?  Yes    Pain Score  2     Pain Location  Ankle    Pain Orientation  Left    Pain Descriptors / Indicators  Aching;Tightness    Pain Type  Chronic pain    Pain Onset  More than a month ago    Pain Frequency  Intermittent       Objective: Left ankle/heel: mild swelling posterior heel centrally Palpation: mild spasms with decreased soft tissue mobility medial calf and proximal attachment of muscles   Treatment:   Manual therapy: 10 min; goal: improved soft tissue elasticity Patient prone with LE's supported: STM to calf primarily medial aspect and left foot plantar fascia superficial and deep techniques with improved elasticity, decreased spasms    Modalities:  Ultrasound:  1 MHz 50% 1.2 w/cm2 x 10 min. to posterior aspect left ankle/ Achilles tendon with patient prone with left LE supported on pillow goal: pain, inflammation, swelling   Iontophoresis with dexamethasone 31m/ml applied 433mmin using medium sized electrode to posterior aspect of left foot/heel over swollen area centrally with patient prone ying; goal: pain, inflammation; no adverse reactions noted (application 8 miAST)(~[id number] application)   High volt estim x 15 min. (2) electrodes applied to left calf over spasms proximally with patient prone with left LE supported on pillow, intensity to tolerance: goal: pain    Patient response to treatment: decreased spasms and tenderness left calf by 30% following STM. improved stiffness and improved ability to walk with less difficulty following treatment.     PT Education - 12/10/17 0936    Education provided  Yes    Education Details  HEP for strengthening and flexibilty LE's    Person(s) Educated  Patient    Methods  Explanation;Handout     Comprehension  Verbalized understanding;Returned demonstration;Verbal cues required          PT Long Term Goals - 12/01/17 1013      PT LONG TERM GOAL #1   Title  Patient will demonstrate improved function with daily tasks as indicated by LEFS score of 40/80     Baseline  LEFS 27/80; LEFS 10/30/17 30/80; 12/01/2017 24/80 (due to knee problems)    Status  Not Met      PT LONG TERM GOAL #2   Title  improved FADI score to 40% or better indcating improved function with standing, going down steps, walking, standing activties    Baseline  FADI 56% :FADI 10/30/16 52%; FADI 12/01/2017 34%    Status  Achieved      PT LONG TERM GOAL #  3   Title  improved FADI score to 30% or less demonstrating improved function with daily tasks involving standing, walking    Baseline  FADI 56%; 12/01/2017 FADI 34%    Status  On-going    Target Date  12/11/17      PT LONG TERM GOAL #4   Title  patient will be indepenent with self management of pain, home program for ROM, strength by discharge to continue progressing towards prior level of function.     Baseline  limited knowledge of appropriate pain control strategies, exercises and progression without assistance, guidance and cuing    Status  On-going    Target Date  11/11/17            Plan - 12/10/17 0945    Clinical Impression Statement  Patient is progressing well towards goals with continued progress with pain and with improving function with being able to walk with closed back shoes. She continues to respond well to current treatment and will benefit from additional physical therapy intervention.     Rehab Potential  Fair    Clinical Impairments Affecting Rehab Potential  (+)motivated, prior level of function (-)chronic condition, X ray report calcaneal spur with cacifications in tendon    PT Frequency  2x / week    PT Duration  6 weeks    PT Treatment/Interventions  Neuromuscular re-education;Manual techniques;Ultrasound;Electrical  Stimulation;Iontophoresis 34m/ml Dexamethasone;Moist Heat;Therapeutic exercise;Therapeutic activities;Patient/family education    PT Next Visit Plan  pain control, [place] estim., therapeutic exercise, manual techniques, iontophoresis    PT Home Exercise Plan  LE exercises: SLR, hamstring stretch, curls, hip abduction, ER, chair squats       Patient will benefit from skilled therapeutic intervention in order to improve the following deficits and impairments:  Abnormal gait, Pain, Decreased activity tolerance, Decreased range of motion, Decreased strength, Difficulty walking, Obesity  Visit Diagnosis: Pain in left ankle and joints of left foot  Muscle weakness (generalized)  Difficulty in walking, not elsewhere classified     Problem List Patient Active Problem List   Diagnosis Date Noted  . 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    BJomarie LongsPT 12/10/2017, 10:35 PM  [facility] [address] [place] [address]Phone: [phone]  Fax:  [phone] Name: LJAYLINN HELLENBRANDMRN: [id number] of Birth: [birth year]

## 2017-12-12 DIAGNOSIS — D72820 Lymphocytosis (symptomatic): Secondary | ICD-10-CM | POA: Diagnosis not present

## 2017-12-16 DIAGNOSIS — M7662 Achilles tendinitis, left leg: Secondary | ICD-10-CM | POA: Diagnosis not present

## 2017-12-17 DIAGNOSIS — G4733 Obstructive sleep apnea (adult) (pediatric): Secondary | ICD-10-CM | POA: Diagnosis not present

## 2017-12-25 ENCOUNTER — Encounter: Payer: Self-pay | Admitting: Physical Therapy

## 2017-12-25 ENCOUNTER — Ambulatory Visit: Payer: PPO | Admitting: Physical Therapy

## 2017-12-25 DIAGNOSIS — M6281 Muscle weakness (generalized): Secondary | ICD-10-CM

## 2017-12-25 DIAGNOSIS — M25572 Pain in left ankle and joints of left foot: Secondary | ICD-10-CM | POA: Diagnosis not present

## 2017-12-25 DIAGNOSIS — R262 Difficulty in walking, not elsewhere classified: Secondary | ICD-10-CM

## 2017-12-25 NOTE — Therapy (Signed)
[facility] [address], [name], [address] Phone: [phone]   Fax:  [phone]  Physical Therapy Treatment  Patient Details  Name: Laura Hale MRN: [id number] Date of Birth: 04-Sep-1945 Referring Provider: Samara Deist DPM   Encounter Date: 12/25/2017  PT End of Session - 12/25/17 0817    Visit Number  24    Number of Visits  28    Date for PT Re-Evaluation  01/22/18    Authorization Type  13 of 20 progress    PT Start Time  0814    PT Stop Time  0905    PT Time Calculation (min)  51 min    Activity Tolerance  Patient tolerated treatment well    Behavior During Therapy  [facility] for tasks assessed/performed       Past Medical History:  Diagnosis Date  . Arthritis    elbow, hands, neck   . Asthma    allergy induced asthma-only exacerbated by smells like perfume, smoke etc  . Atypical childhood psychosis 2007   myoview -negative for ischemia with preserved LV function  . Colon polyps   . Complication of anesthesia    WITH ACDF IN JUNE 2016-PT STATES SHE LOST HER VOICE X 10 WEEKS DUE TO INTUBATION  . COPD (chronic obstructive pulmonary disease) ([place])   . Edema    LEGS/FEET  . Hypercalcemia 2009   calcium  WNL 10.2 on 04-2015  . Hyperlipidemia   . Obesity   . Urinary incontinence     Past Surgical History:  Procedure Laterality Date  . ABDOMINAL HYSTERECTOMY    . ANTERIOR CERVICAL DECOMP/DISCECTOMY FUSION N/A 01/31/2015   Procedure: ANTERIOR CERVICAL DECOMPRESSION/DISCECTOMY FUSION CERVICAL FIVE SIX;  Surgeon: Karie Chimera, MD;  Location: [facility];  Service: Neurosurgery;  Laterality: N/A;  . BACK SURGERY    . CATARACT EXTRACTION W/PHACO Left 02/06/2017   Procedure: CATARACT EXTRACTION PHACO AND INTRAOCULAR LENS PLACEMENT (IOC);  Surgeon: Eulogio Bear, MD;  Location: [facility];  Service: Ophthalmology;  Laterality: Left;  Lot# [id number] H [place]: 00:37.9 AP%: 7.9 CDE: 2.97  . CATARACT  EXTRACTION W/PHACO Right 03/06/2017   Procedure: CATARACT EXTRACTION PHACO AND INTRAOCULAR LENS PLACEMENT (IOC);  Surgeon: Eulogio Bear, MD;  Location: [facility];  Service: Ophthalmology;  Laterality: Right;  Lot #[id number] H [place]: 00.22.0 AP%: 7.5 CDE: 1.64   . COLONOSCOPY    . COLONOSCOPY WITH PROPOFOL N/A 09/23/2017   Procedure: COLONOSCOPY WITH PROPOFOL;  Surgeon: Toledo, Benay Pike, MD;  Location: [facility];  Service: Gastroenterology;  Laterality: N/A;  . ELBOW SURGERY Left 04/2003   x2  & for repair & then remove all hardware, due to injury from a fall  . EYE SURGERY    . FRACTURE SURGERY    . HAND SURGERY Right 2006   injury- then eventually lost remainder of thumb  . KNEE ARTHROSCOPY WITH LATERAL MENISECTOMY Left 01/23/2016   Procedure: KNEE ARTHROSCOPY WITH LATERAL MENISECTOMY;  Surgeon: Corky Mull, MD;  Location: [facility];  Service: Orthopedics;  Laterality: Left;  . ORIF HUMERUS FRACTURE Right 08/31/2015   Procedure: OPEN REDUCTION INTERNAL FIXATION (ORIF) PROXIMAL HUMERUS FRACTURE w/ #2 fberwire;  Surgeon: Corky Mull, MD;  Location: [facility];  Service: Orthopedics;  Laterality: Right;  . 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  Subjective Assessment - 12/25/17 0816    Subjective  Patient reports tightness as chief  concern in left ankle and calf near knee. She is managing symptoms with stretching, ice, elevation and modified shoe wear with minimal use of shoes with backs.     Pertinent History  History of pain left heel/ankle x 1 year that has worsened and she is not unable to stand or walk without severe difficulty. She is limited in walking, standing activities which interferes with her daily like at home and in community.     Limitations  Standing;Walking;House hold activities;Other (comment) recreational activities, stairs    How long can you stand comfortably?  <30 min.    How long can you walk comfortably?  <60 min.     Diagnostic tests  X ray: calcaneal spur left ankle, cacification in Achilles tendon    Patient Stated Goals  Patient would like to walk, stand and return to normal activties without pain or swelling left ankle.    Currently in Pain?  Yes    Pain Score  4     Pain Location  Heel    Pain Orientation  Left;Posterior    Pain Descriptors / Indicators  Tightness;Sore    Pain Type  Chronic pain    Pain Onset  More than a month ago    Pain Frequency  Intermittent             Objective: Left ankle/heel: mild swelling, increased from previous session with increased warmth posterior aspect Palpation: mild spasms with hypersensitivity and decreased soft tissue mobility medial calf at proximal attachment of muscles and above knee medially near hamstring muscles Outcome measure: ankle foot disability index:   Treatment:   Manual therapy: 10 min; goal: improved soft tissue elasticity Patient prone with LE's supported: STM to calf primarily medial aspect and left foot plantar fascia superficial and deep techniques with improved elasticity, decreased spasms    Modalities:  Ultrasound:  1 MHz 50% 1.2 w/cm2 x 10 min. to posterior aspect left ankle/ Achilles tendon with patient prone with left LE supported on pillow goal: pain, inflammation, swelling   Iontophoresis with dexamethasone 53m/ml applied 475mmin using medium sized electrode to posterior aspect of left foot/heel over swollen area central, medial aspect with patient prone lying; goal: pain, inflammation; no adverse reactions noted (application 8 miZOX)(~09inutes following application)   High volt estim x 15 min. (2) electrodes applied to left calf/knee over spasms proximally with patient prone with left LE supported on pillow, intensity to tolerance: goal: pain    Patient response to treatment: improved soft tissue elasticity with decreased tenderness 50% following treatment. patient reported decreased tightness in calf at end of  session    PT Education - 12/25/17 0834    Education provided  Yes    Education Details  proper stretching for Achilles to not stress tendon due to bone spur    Person(s) Educated  Patient    Methods  Explanation    Comprehension  Verbalized understanding          PT Long Term Goals - 12/25/17 0900      PT LONG TERM GOAL #1   Title  Patient will demonstrate improved function with daily tasks as indicated by LEFS score of 40/80     Baseline  LEFS 27/80; LEFS 10/30/17 30/80; 12/01/2017 24/80 (due to knee problems)    Status  Not Met      PT LONG TERM GOAL #2   Title  improved FADI score to 40% or better indcating improved function with standing, going down steps, walking,  standing activties    Baseline  FADI 56% :FADI 10/30/16 52%; FADI 12/01/2017 34%    Status  Achieved      PT LONG TERM GOAL #3   Title  improved FADI score to 30% or less demonstrating improved function with daily tasks involving standing, walking    Baseline  FADI 56%; 12/01/2017 FADI 34%; 12/25/2017 46%    Status  On-going    Target Date  01/22/18      PT LONG TERM GOAL #4   Title  patient will be indepenent with self management of pain, home program for ROM, strength by discharge to continue progressing towards prior level of function.     Baseline  limited knowledge of appropriate pain control strategies, exercises and progression without assistance, guidance and cuing    Status  On-going    Target Date  01/22/18            Plan - 12/25/17 0857    Clinical Impression Statement  Patient with increased swelling and warmth today due to wearing new shoes over the weekend. She is responding well to current treatment and should continue to improve with additional physical therapy intervention. Her FADI score of 46% indicates moderate self perceived impairment with daily tasks. She is progressing slowly due to chronic condition and bone spur in left foot/heel which is aggravated with shoes, walking.         Rehab Potential  Fair    Clinical Impairments Affecting Rehab Potential  (+)motivated, prior level of function (-)chronic condition, X ray report calcaneal spur with cacifications in tendon    PT Frequency  2x / week    PT Duration  4 weeks    PT Treatment/Interventions  Neuromuscular re-education;Manual techniques;Ultrasound;Electrical Stimulation;Iontophoresis 61m/ml Dexamethasone;Moist Heat;Therapeutic exercise;Therapeutic activities;Patient/family education    PT Next Visit Plan  pain control, [place] estim., therapeutic exercise, manual techniques, iontophoresis    PT Home Exercise Plan  LE exercises: SLR, hamstring stretch, curls, hip abduction, ER, chair squats       Patient will benefit from skilled therapeutic intervention in order to improve the following deficits and impairments:  Abnormal gait, Pain, Decreased activity tolerance, Decreased range of motion, Decreased strength, Difficulty walking, Obesity  Visit Diagnosis: Pain in left ankle and joints of left foot - Plan: PT plan of care cert/re-cert  Muscle weakness (generalized) - Plan: PT plan of care cert/re-cert  Difficulty in walking, not elsewhere classified - Plan: PT plan of care cert/re-cert     Problem List Patient Active Problem List   Diagnosis Date Noted  . 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    BJomarie LongsPT 12/26/2017, 8:16 AM  [facility] [address] [place] [address]Phone: [phone]  Fax:  [phone] Name: LSHAKENA CALLARIMRN: [id number] of Birth: 406/15/1947

## 2017-12-29 ENCOUNTER — Encounter: Payer: Self-pay | Admitting: Physical Therapy

## 2017-12-29 ENCOUNTER — Ambulatory Visit: Payer: PPO | Admitting: Physical Therapy

## 2017-12-29 DIAGNOSIS — M25572 Pain in left ankle and joints of left foot: Secondary | ICD-10-CM | POA: Diagnosis not present

## 2017-12-29 DIAGNOSIS — M6281 Muscle weakness (generalized): Secondary | ICD-10-CM

## 2017-12-29 DIAGNOSIS — R262 Difficulty in walking, not elsewhere classified: Secondary | ICD-10-CM

## 2017-12-29 NOTE — Therapy (Signed)
[facility] [address], [name], [address] Phone: [phone]   Fax:  [phone]  Physical Therapy Treatment  Patient Details  Name: Laura Hale MRN: [id number] Date of Birth: 1946/04/13 Referring Provider: Samara Deist DPM   Encounter Date: 12/29/2017  PT End of Session - 12/29/17 0901    Visit Number  25    Number of Visits  28    Date for PT Re-Evaluation  01/22/18    Authorization Type  15 of 20 progress    PT Start Time  0240    PT Stop Time  0946    PT Time Calculation (min)  49 min    Activity Tolerance  Patient tolerated treatment well    Behavior During Therapy  [facility] for tasks assessed/performed       Past Medical History:  Diagnosis Date  . Arthritis    elbow, hands, neck   . Asthma    allergy induced asthma-only exacerbated by smells like perfume, smoke etc  . Atypical childhood psychosis 2007   myoview -negative for ischemia with preserved LV function  . Colon polyps   . Complication of anesthesia    WITH ACDF IN JUNE 2016-PT STATES SHE LOST HER VOICE X 10 WEEKS DUE TO INTUBATION  . COPD (chronic obstructive pulmonary disease) ([place])   . Edema    LEGS/FEET  . Hypercalcemia 2009   calcium  WNL 10.2 on 04-2015  . Hyperlipidemia   . Obesity   . Urinary incontinence     Past Surgical History:  Procedure Laterality Date  . ABDOMINAL HYSTERECTOMY    . ANTERIOR CERVICAL DECOMP/DISCECTOMY FUSION N/A 01/31/2015   Procedure: ANTERIOR CERVICAL DECOMPRESSION/DISCECTOMY FUSION CERVICAL FIVE SIX;  Surgeon: Karie Chimera, MD;  Location: [facility];  Service: Neurosurgery;  Laterality: N/A;  . BACK SURGERY    . CATARACT EXTRACTION W/PHACO Left 02/06/2017   Procedure: CATARACT EXTRACTION PHACO AND INTRAOCULAR LENS PLACEMENT (IOC);  Surgeon: Eulogio Bear, MD;  Location: [facility];  Service: Ophthalmology;  Laterality: Left;  Lot# [id number] H [place]: 00:37.9 AP%: 7.9 CDE: 2.97  . CATARACT  EXTRACTION W/PHACO Right 03/06/2017   Procedure: CATARACT EXTRACTION PHACO AND INTRAOCULAR LENS PLACEMENT (IOC);  Surgeon: Eulogio Bear, MD;  Location: [facility];  Service: Ophthalmology;  Laterality: Right;  Lot #[id number] H [place]: 00.22.0 AP%: 7.5 CDE: 1.64   . COLONOSCOPY    . COLONOSCOPY WITH PROPOFOL N/A 09/23/2017   Procedure: COLONOSCOPY WITH PROPOFOL;  Surgeon: Toledo, Benay Pike, MD;  Location: [facility];  Service: Gastroenterology;  Laterality: N/A;  . ELBOW SURGERY Left 04/2003   x2  & for repair & then remove all hardware, due to injury from a fall  . EYE SURGERY    . FRACTURE SURGERY    . HAND SURGERY Right 2006   injury- then eventually lost remainder of thumb  . KNEE ARTHROSCOPY WITH LATERAL MENISECTOMY Left 01/23/2016   Procedure: KNEE ARTHROSCOPY WITH LATERAL MENISECTOMY;  Surgeon: Corky Mull, MD;  Location: [facility];  Service: Orthopedics;  Laterality: Left;  . ORIF HUMERUS FRACTURE Right 08/31/2015   Procedure: OPEN REDUCTION INTERNAL FIXATION (ORIF) PROXIMAL HUMERUS FRACTURE w/ #2 fberwire;  Surgeon: Corky Mull, MD;  Location: [facility];  Service: Orthopedics;  Laterality: Right;  . 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  Subjective Assessment - 12/29/17 0900    Subjective  Patient reports doing well overall.  Pertinent History  History of pain left heel/ankle x 1 year that has worsened and she is not unable to stand or walk without severe difficulty. She is limited in walking, standing activities which interferes with her daily like at home and in community.     Limitations  Standing;Walking;House hold activities;Other (comment) recreational activities, stairs    How long can you stand comfortably?  <30 min.    How long can you walk comfortably?  <60 min.    Diagnostic tests  X ray: calcaneal spur left ankle, cacification in Achilles tendon    Patient Stated Goals  Patient would like to walk, stand and return to normal  activties without pain or swelling left ankle.    Currently in Pain?  Yes    Pain Score  2     Pain Location  Heel    Pain Orientation  Posterior    Pain Descriptors / Indicators  Tightness    Pain Type  Chronic pain    Pain Onset  More than a month ago    Pain Frequency  Intermittent         Objective: Left ankle/heel: mild swelling, increased from previous session with increased warmth posterior aspect Palpation: mild spasms with hypersensitivity and decreased soft tissue mobility medial calf at tendon muscle junction of muscles    Treatment:   Manual therapy: 10 min; goal: improved soft tissue elasticity Patient prone with LE's supported: STM to calf primarily medial aspect and left foot plantar fascia superficial and deep techniques with improved elasticity, decreased spasms    Modalities:  Ultrasound:  1 MHz 50% 1.2 w/cm2 x 10 min. to posterior aspect left ankle/ Achilles tendon with patient prone with left LE supported on pillow goal: pain, inflammation, swelling   Iontophoresis with dexamethasone 41m/ml applied 475mmin using medium sized electrode to posterior aspect of left foot/heel over swollen area central, medial aspect with patient prone lying; goal: pain, inflammation; no adverse reactions noted (application 8 miGNO)(~03inutes following application)   High volt estim x 15 min. (2) electrodes applied to left calf/knee over spasms medially with patient prone with left LE supported on pillow, intensity to tolerance: goal: pain    Patient response to treatment: decreased warmth 50% posterior heel left foot and improved soft tissue elasticity in calf following treatment. patient reported decreased stiffness in left heel by 50% at end of session.    PT Education - 12/29/17 0940    Education provided  Yes    Education Details  exercise instruction for ROM, gentle stretching, discussed footwear to decrease stress on heel.    Person(s) Educated  Patient    Methods   Explanation;Demonstration;Verbal cues    Comprehension  Verbalized understanding;Returned demonstration;Verbal cues required          PT Long Term Goals - 12/25/17 0900      PT LONG TERM GOAL #1   Title  Patient will demonstrate improved function with daily tasks as indicated by LEFS score of 40/80     Baseline  LEFS 27/80; LEFS 10/30/17 30/80; 12/01/2017 24/80 (due to knee problems)    Status  Not Met      PT LONG TERM GOAL #2   Title  improved FADI score to 40% or better indcating improved function with standing, going down steps, walking, standing activties    Baseline  FADI 56% :FADI 10/30/16 52%; FADI 12/01/2017 34%    Status  Achieved      PT LONG TERM GOAL #3  Title  improved FADI score to 30% or less demonstrating improved function with daily tasks involving standing, walking    Baseline  FADI 56%; 12/01/2017 FADI 34%; 12/25/2017 46%    Status  On-going    Target Date  01/22/18      PT LONG TERM GOAL #4   Title  patient will be indepenent with self management of pain, home program for ROM, strength by discharge to continue progressing towards prior level of function.     Baseline  limited knowledge of appropriate pain control strategies, exercises and progression without assistance, guidance and cuing    Status  On-going    Target Date  01/22/18            Plan - 12/29/17 8251    Clinical Impression Statement  Patient continues with slow, steady progress with goals. She continues with increased warmth and pain with wearing closed back shoes and with increased swelling. She continues to respond well to current treatment and will benefit from additional physical therapy intervention to achieve goals.     Rehab Potential  Fair    Clinical Impairments Affecting Rehab Potential  (+)motivated, prior level of function (-)chronic condition, X ray report calcaneal spur with cacifications in tendon    PT Frequency  2x / week    PT Duration  4 weeks    PT Treatment/Interventions   Neuromuscular re-education;Manual techniques;Ultrasound;Electrical Stimulation;Iontophoresis 77m/ml Dexamethasone;Moist Heat;Therapeutic exercise;Therapeutic activities;Patient/family education    PT Next Visit Plan  pain control, [place] estim., therapeutic exercise, manual techniques, iontophoresis    PT Home Exercise Plan  LE exercises: SLR, hamstring stretch, curls, hip abduction, ER, chair squats       Patient will benefit from skilled therapeutic intervention in order to improve the following deficits and impairments:  Abnormal gait, Pain, Decreased activity tolerance, Decreased range of motion, Decreased strength, Difficulty walking, Obesity  Visit Diagnosis: Pain in left ankle and joints of left foot  Muscle weakness (generalized)  Difficulty in walking, not elsewhere classified     Problem List Patient Active Problem List   Diagnosis Date Noted  . 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    BJomarie LongsPT 12/29/2017, 9:51 AM  [facility] [address] [place] [address]Phone: [phone]  Fax:  [phone] Name: Laura LEVENHAGENMRN: [id number] of Birth: 407/27/1947

## 2018-01-01 ENCOUNTER — Ambulatory Visit: Payer: PPO | Admitting: Physical Therapy

## 2018-01-01 ENCOUNTER — Encounter: Payer: Self-pay | Admitting: Physical Therapy

## 2018-01-01 DIAGNOSIS — M25572 Pain in left ankle and joints of left foot: Secondary | ICD-10-CM | POA: Diagnosis not present

## 2018-01-01 DIAGNOSIS — R262 Difficulty in walking, not elsewhere classified: Secondary | ICD-10-CM

## 2018-01-01 DIAGNOSIS — M6281 Muscle weakness (generalized): Secondary | ICD-10-CM

## 2018-01-01 NOTE — Therapy (Signed)
[facility] [address] [name], [address] Phone: [phone]   Fax:  [phone]  Physical Therapy Treatment  Patient Details  Name: Laura Hale MRN: [id number] Date of Birth: Sep 19, 1945 Referring Provider: Samara Deist DPM   Encounter Date: 01/01/2018  PT End of Session - 01/01/18 1025    Visit Number  26    Number of Visits  28    Date for PT Re-Evaluation  01/22/18    Authorization Type  16 of 20 progress    PT Start Time  0950    PT Stop Time  1031    PT Time Calculation (min)  41 min    Activity Tolerance  Patient tolerated treatment well    Behavior During Therapy  [facility] for tasks assessed/performed       Past Medical History:  Diagnosis Date  . Arthritis    elbow, hands, neck   . Asthma    allergy induced asthma-only exacerbated by smells like perfume, smoke etc  . Atypical childhood psychosis 2007   myoview -negative for ischemia with preserved LV function  . Colon polyps   . Complication of anesthesia    WITH ACDF IN JUNE 2016-PT STATES SHE LOST HER VOICE X 10 WEEKS DUE TO INTUBATION  . COPD (chronic obstructive pulmonary disease) ([place])   . Edema    LEGS/FEET  . Hypercalcemia 2009   calcium  WNL 10.2 on 04-2015  . Hyperlipidemia   . Obesity   . Urinary incontinence     Past Surgical History:  Procedure Laterality Date  . ABDOMINAL HYSTERECTOMY    . ANTERIOR CERVICAL DECOMP/DISCECTOMY FUSION N/A 01/31/2015   Procedure: ANTERIOR CERVICAL DECOMPRESSION/DISCECTOMY FUSION CERVICAL FIVE SIX;  Surgeon: Karie Chimera, MD;  Location: [facility];  Service: Neurosurgery;  Laterality: N/A;  . BACK SURGERY    . CATARACT EXTRACTION W/PHACO Left 02/06/2017   Procedure: CATARACT EXTRACTION PHACO AND INTRAOCULAR LENS PLACEMENT (IOC);  Surgeon: Eulogio Bear, MD;  Location: [facility];  Service: Ophthalmology;  Laterality: Left;  Lot# [id number] [place]: 00:37.9 AP%: 7.9 CDE: 2.97  . CATARACT  EXTRACTION W/PHACO Right 03/06/2017   Procedure: CATARACT EXTRACTION PHACO AND INTRAOCULAR LENS PLACEMENT (IOC);  Surgeon: Eulogio Bear, MD;  Location: [facility];  Service: Ophthalmology;  Laterality: Right;  Lot #[id number] H [place]: 00.22.0 AP%: 7.5 CDE: 1.64   . COLONOSCOPY    . COLONOSCOPY WITH PROPOFOL N/A 09/23/2017   Procedure: COLONOSCOPY WITH PROPOFOL;  Surgeon: Toledo, Benay Pike, MD;  Location: [facility];  Service: Gastroenterology;  Laterality: N/A;  . ELBOW SURGERY Left 04/2003   x2  & for repair & then remove all hardware, due to injury from a fall  . EYE SURGERY    . FRACTURE SURGERY    . HAND SURGERY Right 2006   injury- then eventually lost remainder of thumb  . KNEE ARTHROSCOPY WITH LATERAL MENISECTOMY Left 01/23/2016   Procedure: KNEE ARTHROSCOPY WITH LATERAL MENISECTOMY;  Surgeon: Corky Mull, MD;  Location: [facility];  Service: Orthopedics;  Laterality: Left;  . ORIF HUMERUS FRACTURE Right 08/31/2015   Procedure: OPEN REDUCTION INTERNAL FIXATION (ORIF) PROXIMAL HUMERUS FRACTURE w/ #2 fberwire;  Surgeon: Corky Mull, MD;  Location: [facility];  Service: Orthopedics;  Laterality: Right;  . 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  Subjective Assessment - 01/01/18 1023    Subjective  Patient reports she continues to  see good carry over with reduction in symptoms left heel. Her chief concern is tightness in calf/heel. She is performing gentle stretches at home with balance stones.     Pertinent History  History of pain left heel/ankle x 1 year that has worsened and she is not unable to stand or walk without severe difficulty. She is limited in walking, standing activities which interferes with her daily like at home and in community.     Limitations  Standing;Walking;House hold activities;Other (comment) recreational activities, stairs    How long can you stand comfortably?  <30 min.    How long can you walk comfortably?  <60 min.     Diagnostic tests  X ray: calcaneal spur left ankle, cacification in Achilles tendon    Patient Stated Goals  Patient would like to walk, stand and return to normal activties without pain or swelling left ankle.    Currently in Pain?  Yes    Pain Score  2     Pain Location  Heel    Pain Orientation  Posterior    Pain Descriptors / Indicators  Tightness    Pain Type  Chronic pain    Pain Onset  More than a month ago    Pain Frequency  Intermittent         Objective: Left ankle/heel:mild swelling, decreased from previous session with increased warmth posterior aspect Palpation: mild spasms with hypersensitivity and decreased soft tissue mobility medial calf at tendon muscle junction of muscles   Treatment: Manual therapy:58mn; goal: improved soft tissue elasticity Patient prone with LE's supported: STM to calf primarily medial aspect and left foot plantar fascia superficial and deep techniques with improved elasticity, decreased spasms  Modalities:  Ultrasound: 1MHz 50% 1.2 w/cm2 x810m. to posterior aspect left ankle/ Achilles tendon with patient prone with left LE supported on pillow goal: pain,inflammation,swelling  Iontophoresis with dexamethasone 59m66ml applied 37m33mn using medium sized electrode to posterior aspect of left foot/heel over swollen area central, medial aspect with patientpronelying;goal: pain, inflammation; no adverse reactions noted (application8min46m14 minutes following application)  High volt estimx15min76m electrodes applied to left calf/kneeover spasms medially with patientpronewith left LE supported on pillow, intensity to tolerance: goal: pain  Patient response to treatment: improved soft tissue elasticity left heel and calf by 30% following treatment and patient reported much less stiffness with walking as end of session.i   PT Education - 01/01/18 1025    Education provided  Yes    Education Details  re assessed home  program    Person(s) Educated  Patient    Methods  Explanation    Comprehension  Verbalized understanding          PT Long Term Goals - 12/25/17 0900      PT LONG TERM GOAL #1   Title  Patient will demonstrate improved function with daily tasks as indicated by LEFS score of 40/80     Baseline  LEFS 27/80; LEFS 10/30/17 30/80; 12/01/2017 24/80 (due to knee problems)    Status  Not Met      PT LONG TERM GOAL #2   Title  improved FADI score to 40% or better indcating improved function with standing, going down steps, walking, standing activties    Baseline  FADI 56% :FADI 10/30/16 52%; FADI 12/01/2017 34%    Status  Achieved      PT LONG TERM GOAL #3   Title  improved FADI score to 30% or less demonstrating improved function with daily tasks involving standing,  walking    Baseline  FADI 56%; 12/01/2017 FADI 34%; 12/25/2017 46%    Status  On-going    Target Date  01/22/18      PT LONG TERM GOAL #4   Title  patient will be indepenent with self management of pain, home program for ROM, strength by discharge to continue progressing towards prior level of function.     Baseline  limited knowledge of appropriate pain control strategies, exercises and progression without assistance, guidance and cuing    Status  On-going    Target Date  01/22/18            Plan - 01/01/18 1026    Clinical Impression Statement  Patient demonstrates good carry over with pain relieft between sessions and is improving knowledge of appropriate exercises and stretches to perform at home for pain control.     Rehab Potential  Fair    Clinical Impairments Affecting Rehab Potential  (+)motivated, prior level of function (-)chronic condition, X ray report calcaneal spur with cacifications in tendon    PT Frequency  2x / week    PT Duration  4 weeks    PT Treatment/Interventions  Neuromuscular re-education;Manual techniques;Ultrasound;Electrical Stimulation;Iontophoresis 61m/ml Dexamethasone;Moist  Heat;Therapeutic exercise;Therapeutic activities;Patient/family education    PT Next Visit Plan  pain control, [place] estim., therapeutic exercise, manual techniques, iontophoresis    PT Home Exercise Plan  LE exercises: SLR, hamstring stretch, curls, hip abduction, ER, chair squats       Patient will benefit from skilled therapeutic intervention in order to improve the following deficits and impairments:  Abnormal gait, Pain, Decreased activity tolerance, Decreased range of motion, Decreased strength, Difficulty walking, Obesity  Visit Diagnosis: Pain in left ankle and joints of left foot  Muscle weakness (generalized)  Difficulty in walking, not elsewhere classified     Problem List Patient Active Problem List   Diagnosis Date Noted  . 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    BJomarie LongsPT 01/01/2018, 10:28 PM  [facility] [address] [place] [address]Phone: [phone]  Fax:  [phone] Name: LSAPHIA VANDERFORDMRN: [id number] of Birth: 412-Jun-1947

## 2018-01-06 ENCOUNTER — Ambulatory Visit: Payer: PPO | Admitting: Physical Therapy

## 2018-01-06 ENCOUNTER — Encounter: Payer: Self-pay | Admitting: Physical Therapy

## 2018-01-06 DIAGNOSIS — M25572 Pain in left ankle and joints of left foot: Secondary | ICD-10-CM | POA: Diagnosis not present

## 2018-01-06 DIAGNOSIS — R262 Difficulty in walking, not elsewhere classified: Secondary | ICD-10-CM

## 2018-01-06 DIAGNOSIS — M6281 Muscle weakness (generalized): Secondary | ICD-10-CM

## 2018-01-06 DIAGNOSIS — R2689 Other abnormalities of gait and mobility: Secondary | ICD-10-CM

## 2018-01-06 NOTE — Therapy (Signed)
[facility] [address], [name], [address] Phone: [phone]   Fax:  [phone]  Physical Therapy Treatment/Discharge Summary  Patient Details  Name: Laura Hale MRN: [id number] Date of Birth: 07-05-1946 Referring Provider: Samara Deist DPM   Encounter Date: 01/06/2018   Patient began physical therapy on 09/16/2017 and has attended 37 sessions through 01/06/2018. She has achieved goals #2, 4; #3 is ongoing and she is independent in home program for continued self management of pain/symptoms and exercises as instructed. Plan discharge from physical therapy at this time.    PT End of Session - 01/06/18 1113    Visit Number  37    Number of Visits  28    Date for PT Re-Evaluation  01/22/18    Authorization Type  17 of 20 progress    PT Start Time  1038    PT Stop Time  1121    PT Time Calculation (min)  43 min    Activity Tolerance  Patient tolerated treatment well    Behavior During Therapy  WFL for tasks assessed/performed       Past Medical History:  Diagnosis Date  . Arthritis    elbow, hands, neck   . Asthma    allergy induced asthma-only exacerbated by smells like perfume, smoke etc  . Atypical childhood psychosis 2007   myoview -negative for ischemia with preserved LV function  . Colon polyps   . Complication of anesthesia    WITH ACDF IN JUNE 2016-PT STATES SHE LOST HER VOICE X 10 WEEKS DUE TO INTUBATION  . COPD (chronic obstructive pulmonary disease) ([place])   . Edema    LEGS/FEET  . Hypercalcemia 2009   calcium  WNL 10.2 on 04-2015  . Hyperlipidemia   . Obesity   . Urinary incontinence     Past Surgical History:  Procedure Laterality Date  . ABDOMINAL HYSTERECTOMY    . ANTERIOR CERVICAL DECOMP/DISCECTOMY FUSION N/A 01/31/2015   Procedure: ANTERIOR CERVICAL DECOMPRESSION/DISCECTOMY FUSION CERVICAL FIVE SIX;  Surgeon: Karie Chimera, MD;  Location: [facility];  Service: Neurosurgery;   Laterality: N/A;  . BACK SURGERY    . CATARACT EXTRACTION W/PHACO Left 02/06/2017   Procedure: CATARACT EXTRACTION PHACO AND INTRAOCULAR LENS PLACEMENT (IOC);  Surgeon: Eulogio Bear, MD;  Location: [facility];  Service: Ophthalmology;  Laterality: Left;  Lot# [id number] H [place]: 00:37.9 AP%: 7.9 CDE: 2.97  . CATARACT EXTRACTION W/PHACO Right 03/06/2017   Procedure: CATARACT EXTRACTION PHACO AND INTRAOCULAR LENS PLACEMENT (IOC);  Surgeon: Eulogio Bear, MD;  Location: [facility];  Service: Ophthalmology;  Laterality: Right;  Lot #[id number] H [place]: 00.22.0 AP%: 7.5 CDE: 1.64   . COLONOSCOPY    . COLONOSCOPY WITH PROPOFOL N/A 09/23/2017   Procedure: COLONOSCOPY WITH PROPOFOL;  Surgeon: Toledo, Benay Pike, MD;  Location: [facility];  Service: Gastroenterology;  Laterality: N/A;  . ELBOW SURGERY Left 04/2003   x2  & for repair & then remove all hardware, due to injury from a fall  . EYE SURGERY    . FRACTURE SURGERY    . HAND SURGERY Right 2006   injury- then eventually lost remainder of thumb  . KNEE ARTHROSCOPY WITH LATERAL MENISECTOMY Left 01/23/2016   Procedure: KNEE ARTHROSCOPY WITH LATERAL MENISECTOMY;  Surgeon: Corky Mull, MD;  Location: [facility];  Service: Orthopedics;  Laterality: Left;  . ORIF HUMERUS FRACTURE Right 08/31/2015   Procedure: OPEN REDUCTION INTERNAL FIXATION (ORIF) PROXIMAL HUMERUS FRACTURE w/ #2 fberwire;  Surgeon: Corky Mull, MD;  Location: [facility];  Service: Orthopedics;  Laterality: Right;  . 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  Subjective Assessment - 01/06/18 1109    Subjective  Patient reports not having any real pain anymore; her symptoms are described as irritation or tightness. She is seeing good results with stretching and self massage of foot and has seen a great deal of difference with physical therapy intervention. she agrees to discharge at this time.     Pertinent History  History of pain left heel/ankle x 1  year that has worsened and she is not unable to stand or walk without severe difficulty. She is limited in walking, standing activities which interferes with her daily like at home and in community.     Limitations  Standing;Walking;House hold activities;Other (comment) recreational activities, stairs    How long can you stand comfortably?  <30 min.    How long can you walk comfortably?  <60 min.    Diagnostic tests  X ray: calcaneal spur left ankle, cacification in Achilles tendon    Patient Stated Goals  Patient would like to walk, stand and return to normal activties without pain or swelling left ankle.    Currently in Pain?  Yes    Pain Score  2     Pain Location  Heel    Pain Orientation  Left;Posterior    Pain Descriptors / Indicators  Tightness    Pain Type  Chronic pain    Pain Onset  More than a month ago    Pain Frequency  Intermittent         Objective: Left ankle/heel:mild swelling, decreased from previous session with increased warmth posterior aspect Palpation: mild spasms with hypersensitivity and decreased soft tissue mobility medial calf at tendon muscle junction of muscles, lateral calf, evertors FADI: 46%: moderate self perceived disability   Treatment: Manual therapy:69mn; goal: improved soft tissue elasticity Patient prone with LE's supported: STM to calf primarily medial aspect and left foot plantar fascia superficial and deep techniques with improved elasticity, decreased spasms  Modalities:  Ultrasound: 1MHz 50% 1.2 w/cm2 x183m. to posterior aspect left ankle/ Achilles tendon with patient prone with left LE supported on pillow goal: pain,inflammation,swelling  Iontophoresis with dexamethasone 72m372ml applied 8m3mn using small sized electrode to posterior aspect of left foot/heel over swollen area central, medial aspect with patientpronelying;goal: pain, inflammation; no adverse reactions noted (application5min50m14 minutes following  application)  [place] [id number] applied to left calf, 10/10 cycle, intensity to tolerance,contraction with patientpronewith left LE supported on pillow; goal: muscle re education  Patient response to treatment:improved soft tissue elasticity left calf, heel by 30% following STM. Decreased tightness in left heel reported at end of session, allowing improved ability to walk with less difficulty.    PT Education - 01/06/18 1111    Education provided  Yes    Education Details  re assessed HEP to continue at home    Person(s) Educated  Patient    Methods  Explanation    Comprehension  Verbalized understanding          PT Long Term Goals - 01/06/18 1200      PT LONG TERM GOAL #1   Title  Patient will demonstrate improved function with daily tasks as indicated by LEFS score of 40/80     Baseline  LEFS 27/80; LEFS 10/30/17 30/80; 12/01/2017 24/80 (due to knee problems)  Status  Not Met      PT LONG TERM GOAL #2   Title  improved FADI score to 40% or better indcating improved function with standing, going down steps, walking, standing activties    Baseline  FADI 56% :FADI 10/30/16 52%; FADI 12/01/2017 34%    Status  Achieved      PT LONG TERM GOAL #3   Title  improved FADI score to 30% or less demonstrating improved function with daily tasks involving standing, walking    Baseline  FADI 56%; 12/01/2017 FADI 34%; 12/25/2017 46%    Status  On-going      PT LONG TERM GOAL #4   Title  patient will be indepenent with self management of pain, home program for ROM, strength by discharge to continue progressing towards prior level of function.     Baseline  limited knowledge of appropriate pain control strategies, exercises and progression without assistance, guidance and cuing    Status  Achieved            Plan - 01/06/18 1125    Clinical Impression Statement  Patient is independent with home program and has improved at least 70% per patient report since  beginning physical therapy. She continues to see progress with left heel and foot pain and is able to ambulate with less difficulty than prior to beginning physical therapy. She should continue to progress with independent self management.     Rehab Potential  Fair    Clinical Impairments Affecting Rehab Potential  (+)motivated, prior level of function (-)chronic condition, X ray report calcaneal spur with cacifications in tendon    PT Frequency  2x / week    PT Duration  4 weeks    PT Treatment/Interventions  Neuromuscular re-education;Manual techniques;Ultrasound;Electrical Stimulation;Iontophoresis 41m/ml Dexamethasone;Moist Heat;Therapeutic exercise;Therapeutic activities;Patient/family education    PT Next Visit Plan  pain control, [place] estim., therapeutic exercise, manual techniques, iontophoresis    PT Home Exercise Plan  LE exercises: SLR, hamstring stretch, curls, hip abduction, ER, chair squats       Patient will benefit from skilled therapeutic intervention in order to improve the following deficits and impairments:  Abnormal gait, Pain, Decreased activity tolerance, Decreased range of motion, Decreased strength, Difficulty walking, Obesity  Visit Diagnosis: Pain in left ankle and joints of left foot  Muscle weakness (generalized)  Difficulty in walking, not elsewhere classified  Other abnormalities of gait and mobility     Problem List Patient Active Problem List   Diagnosis Date Noted  . 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    BJomarie LongsPT 01/06/2018, 10:11 PM  [facility] [address] [place] [address]Phone: [phone]  Fax:  [phone] Name: LROMELL CAVANAHMRN: [id number] of Birth: [birth year]

## 2018-01-08 ENCOUNTER — Encounter: Payer: PPO | Admitting: Physical Therapy

## 2018-01-17 DIAGNOSIS — G4733 Obstructive sleep apnea (adult) (pediatric): Secondary | ICD-10-CM | POA: Diagnosis not present

## 2018-01-20 DIAGNOSIS — G4733 Obstructive sleep apnea (adult) (pediatric): Secondary | ICD-10-CM | POA: Diagnosis not present

## 2018-02-16 DIAGNOSIS — G4733 Obstructive sleep apnea (adult) (pediatric): Secondary | ICD-10-CM | POA: Diagnosis not present

## 2018-03-19 DIAGNOSIS — G4733 Obstructive sleep apnea (adult) (pediatric): Secondary | ICD-10-CM | POA: Diagnosis not present

## 2018-04-02 DIAGNOSIS — H43813 Vitreous degeneration, bilateral: Secondary | ICD-10-CM | POA: Diagnosis not present

## 2018-04-19 DIAGNOSIS — G4733 Obstructive sleep apnea (adult) (pediatric): Secondary | ICD-10-CM | POA: Diagnosis not present

## 2018-05-15 DIAGNOSIS — G4733 Obstructive sleep apnea (adult) (pediatric): Secondary | ICD-10-CM | POA: Diagnosis not present

## 2018-05-18 ENCOUNTER — Other Ambulatory Visit: Payer: Self-pay | Admitting: Internal Medicine

## 2018-05-18 DIAGNOSIS — Z1231 Encounter for screening mammogram for malignant neoplasm of breast: Secondary | ICD-10-CM

## 2018-05-19 DIAGNOSIS — G4733 Obstructive sleep apnea (adult) (pediatric): Secondary | ICD-10-CM | POA: Diagnosis not present

## 2018-05-22 DIAGNOSIS — D72820 Lymphocytosis (symptomatic): Secondary | ICD-10-CM | POA: Diagnosis not present

## 2018-05-22 DIAGNOSIS — I10 Essential (primary) hypertension: Secondary | ICD-10-CM | POA: Diagnosis not present

## 2018-05-22 DIAGNOSIS — R739 Hyperglycemia, unspecified: Secondary | ICD-10-CM | POA: Diagnosis not present

## 2018-05-22 DIAGNOSIS — E7849 Other hyperlipidemia: Secondary | ICD-10-CM | POA: Diagnosis not present

## 2018-05-29 ENCOUNTER — Ambulatory Visit
Admission: RE | Admit: 2018-05-29 | Discharge: 2018-05-29 | Disposition: A | Discharge status: Home / Self Care | Payer: PPO | Source: Ambulatory Visit | Attending: Internal Medicine | Admitting: Internal Medicine

## 2018-05-29 DIAGNOSIS — Z1231 Encounter for screening mammogram for malignant neoplasm of breast: Secondary | ICD-10-CM | POA: Diagnosis not present

## 2018-06-01 DIAGNOSIS — S61226A Laceration with foreign body of right little finger without damage to nail, initial encounter: Secondary | ICD-10-CM | POA: Diagnosis not present

## 2018-06-03 DIAGNOSIS — Z4800 Encounter for change or removal of nonsurgical wound dressing: Secondary | ICD-10-CM | POA: Diagnosis not present

## 2018-06-03 DIAGNOSIS — S61226D Laceration with foreign body of right little finger without damage to nail, subsequent encounter: Secondary | ICD-10-CM | POA: Diagnosis not present

## 2018-06-05 DIAGNOSIS — S61226D Laceration with foreign body of right little finger without damage to nail, subsequent encounter: Secondary | ICD-10-CM | POA: Diagnosis not present

## 2018-06-08 DIAGNOSIS — S61226D Laceration with foreign body of right little finger without damage to nail, subsequent encounter: Secondary | ICD-10-CM | POA: Diagnosis not present

## 2018-06-19 DIAGNOSIS — G4733 Obstructive sleep apnea (adult) (pediatric): Secondary | ICD-10-CM | POA: Diagnosis not present

## 2018-07-19 DIAGNOSIS — G4733 Obstructive sleep apnea (adult) (pediatric): Secondary | ICD-10-CM | POA: Diagnosis not present

## 2018-07-23 DIAGNOSIS — Z0000 Encounter for general adult medical examination without abnormal findings: Secondary | ICD-10-CM | POA: Diagnosis not present

## 2018-07-23 DIAGNOSIS — E7849 Other hyperlipidemia: Secondary | ICD-10-CM | POA: Diagnosis not present

## 2018-07-23 DIAGNOSIS — J449 Chronic obstructive pulmonary disease, unspecified: Secondary | ICD-10-CM | POA: Diagnosis not present

## 2018-07-23 DIAGNOSIS — G4733 Obstructive sleep apnea (adult) (pediatric): Secondary | ICD-10-CM | POA: Diagnosis not present

## 2018-07-23 DIAGNOSIS — E559 Vitamin D deficiency, unspecified: Secondary | ICD-10-CM | POA: Diagnosis not present

## 2018-07-23 DIAGNOSIS — M25511 Pain in right shoulder: Secondary | ICD-10-CM | POA: Diagnosis not present

## 2018-07-23 DIAGNOSIS — R739 Hyperglycemia, unspecified: Secondary | ICD-10-CM | POA: Diagnosis not present

## 2018-07-23 DIAGNOSIS — M25512 Pain in left shoulder: Secondary | ICD-10-CM | POA: Diagnosis not present

## 2018-07-23 DIAGNOSIS — K219 Gastro-esophageal reflux disease without esophagitis: Secondary | ICD-10-CM | POA: Diagnosis not present

## 2018-07-23 DIAGNOSIS — G8929 Other chronic pain: Secondary | ICD-10-CM | POA: Diagnosis not present

## 2018-07-23 DIAGNOSIS — I10 Essential (primary) hypertension: Secondary | ICD-10-CM | POA: Diagnosis not present

## 2018-07-23 DIAGNOSIS — Z124 Encounter for screening for malignant neoplasm of cervix: Secondary | ICD-10-CM | POA: Diagnosis not present

## 2018-07-23 DIAGNOSIS — Z6841 Body Mass Index (BMI) 40.0 and over, adult: Secondary | ICD-10-CM | POA: Diagnosis not present

## 2018-08-19 DIAGNOSIS — G4733 Obstructive sleep apnea (adult) (pediatric): Secondary | ICD-10-CM | POA: Diagnosis not present

## 2018-09-01 DIAGNOSIS — G4733 Obstructive sleep apnea (adult) (pediatric): Secondary | ICD-10-CM | POA: Diagnosis not present

## 2018-09-08 DIAGNOSIS — Z124 Encounter for screening for malignant neoplasm of cervix: Secondary | ICD-10-CM | POA: Diagnosis not present

## 2018-09-19 DIAGNOSIS — G4733 Obstructive sleep apnea (adult) (pediatric): Secondary | ICD-10-CM | POA: Diagnosis not present

## 2018-10-18 DIAGNOSIS — G4733 Obstructive sleep apnea (adult) (pediatric): Secondary | ICD-10-CM | POA: Diagnosis not present

## 2018-10-19 DIAGNOSIS — I10 Essential (primary) hypertension: Secondary | ICD-10-CM | POA: Diagnosis not present

## 2018-10-19 DIAGNOSIS — G4733 Obstructive sleep apnea (adult) (pediatric): Secondary | ICD-10-CM | POA: Diagnosis not present

## 2018-10-19 DIAGNOSIS — R739 Hyperglycemia, unspecified: Secondary | ICD-10-CM | POA: Diagnosis not present

## 2018-10-19 DIAGNOSIS — E559 Vitamin D deficiency, unspecified: Secondary | ICD-10-CM | POA: Diagnosis not present

## 2018-10-19 DIAGNOSIS — E7849 Other hyperlipidemia: Secondary | ICD-10-CM | POA: Diagnosis not present

## 2018-10-19 DIAGNOSIS — K219 Gastro-esophageal reflux disease without esophagitis: Secondary | ICD-10-CM | POA: Diagnosis not present

## 2018-10-19 DIAGNOSIS — J449 Chronic obstructive pulmonary disease, unspecified: Secondary | ICD-10-CM | POA: Diagnosis not present

## 2018-10-26 DIAGNOSIS — E559 Vitamin D deficiency, unspecified: Secondary | ICD-10-CM | POA: Diagnosis not present

## 2018-10-26 DIAGNOSIS — Z6841 Body Mass Index (BMI) 40.0 and over, adult: Secondary | ICD-10-CM | POA: Diagnosis not present

## 2018-10-26 DIAGNOSIS — R739 Hyperglycemia, unspecified: Secondary | ICD-10-CM | POA: Diagnosis not present

## 2018-10-26 DIAGNOSIS — J449 Chronic obstructive pulmonary disease, unspecified: Secondary | ICD-10-CM | POA: Diagnosis not present

## 2018-10-26 DIAGNOSIS — I10 Essential (primary) hypertension: Secondary | ICD-10-CM | POA: Diagnosis not present

## 2018-10-26 DIAGNOSIS — E7849 Other hyperlipidemia: Secondary | ICD-10-CM | POA: Diagnosis not present

## 2018-10-26 DIAGNOSIS — G4733 Obstructive sleep apnea (adult) (pediatric): Secondary | ICD-10-CM | POA: Diagnosis not present

## 2018-10-26 DIAGNOSIS — K219 Gastro-esophageal reflux disease without esophagitis: Secondary | ICD-10-CM | POA: Diagnosis not present

## 2018-10-26 DIAGNOSIS — G629 Polyneuropathy, unspecified: Secondary | ICD-10-CM | POA: Diagnosis not present

## 2018-11-18 DIAGNOSIS — G4733 Obstructive sleep apnea (adult) (pediatric): Secondary | ICD-10-CM | POA: Diagnosis not present

## 2018-12-08 DIAGNOSIS — G4733 Obstructive sleep apnea (adult) (pediatric): Secondary | ICD-10-CM | POA: Diagnosis not present

## 2018-12-18 DIAGNOSIS — G4733 Obstructive sleep apnea (adult) (pediatric): Secondary | ICD-10-CM | POA: Diagnosis not present

## 2019-01-18 DIAGNOSIS — G4733 Obstructive sleep apnea (adult) (pediatric): Secondary | ICD-10-CM | POA: Diagnosis not present

## 2019-01-22 DIAGNOSIS — K219 Gastro-esophageal reflux disease without esophagitis: Secondary | ICD-10-CM | POA: Diagnosis not present

## 2019-01-22 DIAGNOSIS — I10 Essential (primary) hypertension: Secondary | ICD-10-CM | POA: Diagnosis not present

## 2019-01-22 DIAGNOSIS — R739 Hyperglycemia, unspecified: Secondary | ICD-10-CM | POA: Diagnosis not present

## 2019-01-22 DIAGNOSIS — E7849 Other hyperlipidemia: Secondary | ICD-10-CM | POA: Diagnosis not present

## 2019-01-29 DIAGNOSIS — I10 Essential (primary) hypertension: Secondary | ICD-10-CM | POA: Diagnosis not present

## 2019-01-29 DIAGNOSIS — J449 Chronic obstructive pulmonary disease, unspecified: Secondary | ICD-10-CM | POA: Diagnosis not present

## 2019-01-29 DIAGNOSIS — E559 Vitamin D deficiency, unspecified: Secondary | ICD-10-CM | POA: Diagnosis not present

## 2019-01-29 DIAGNOSIS — G4733 Obstructive sleep apnea (adult) (pediatric): Secondary | ICD-10-CM | POA: Diagnosis not present

## 2019-01-29 DIAGNOSIS — M5020 Other cervical disc displacement, unspecified cervical region: Secondary | ICD-10-CM | POA: Diagnosis not present

## 2019-01-29 DIAGNOSIS — E7849 Other hyperlipidemia: Secondary | ICD-10-CM | POA: Diagnosis not present

## 2019-01-29 DIAGNOSIS — Z6841 Body Mass Index (BMI) 40.0 and over, adult: Secondary | ICD-10-CM | POA: Diagnosis not present

## 2019-01-29 DIAGNOSIS — R739 Hyperglycemia, unspecified: Secondary | ICD-10-CM | POA: Diagnosis not present

## 2019-02-04 DIAGNOSIS — G4733 Obstructive sleep apnea (adult) (pediatric): Secondary | ICD-10-CM | POA: Diagnosis not present

## 2019-02-17 DIAGNOSIS — G4733 Obstructive sleep apnea (adult) (pediatric): Secondary | ICD-10-CM | POA: Diagnosis not present

## 2019-03-17 DIAGNOSIS — G4733 Obstructive sleep apnea (adult) (pediatric): Secondary | ICD-10-CM | POA: Diagnosis not present

## 2019-04-13 DIAGNOSIS — G4733 Obstructive sleep apnea (adult) (pediatric): Secondary | ICD-10-CM | POA: Diagnosis not present

## 2019-04-29 ENCOUNTER — Other Ambulatory Visit: Payer: Self-pay | Admitting: Internal Medicine

## 2019-04-29 DIAGNOSIS — Z1231 Encounter for screening mammogram for malignant neoplasm of breast: Secondary | ICD-10-CM

## 2019-05-27 DIAGNOSIS — G4733 Obstructive sleep apnea (adult) (pediatric): Secondary | ICD-10-CM | POA: Diagnosis not present

## 2019-06-04 ENCOUNTER — Ambulatory Visit
Admission: RE | Admit: 2019-06-04 | Discharge: 2019-06-04 | Disposition: A | Discharge status: Home / Self Care | Payer: PPO | Source: Ambulatory Visit | Attending: Internal Medicine | Admitting: Internal Medicine

## 2019-06-04 ENCOUNTER — Other Ambulatory Visit: Payer: Self-pay

## 2019-06-04 DIAGNOSIS — Z1231 Encounter for screening mammogram for malignant neoplasm of breast: Secondary | ICD-10-CM | POA: Diagnosis not present

## 2019-06-15 DIAGNOSIS — M5020 Other cervical disc displacement, unspecified cervical region: Secondary | ICD-10-CM | POA: Diagnosis not present

## 2019-06-15 DIAGNOSIS — R739 Hyperglycemia, unspecified: Secondary | ICD-10-CM | POA: Diagnosis not present

## 2019-06-15 DIAGNOSIS — R202 Paresthesia of skin: Secondary | ICD-10-CM | POA: Diagnosis not present

## 2019-06-15 DIAGNOSIS — I10 Essential (primary) hypertension: Secondary | ICD-10-CM | POA: Diagnosis not present

## 2019-06-15 DIAGNOSIS — R29898 Other symptoms and signs involving the musculoskeletal system: Secondary | ICD-10-CM | POA: Diagnosis not present

## 2019-06-15 DIAGNOSIS — R269 Unspecified abnormalities of gait and mobility: Secondary | ICD-10-CM | POA: Diagnosis not present

## 2019-06-15 DIAGNOSIS — J449 Chronic obstructive pulmonary disease, unspecified: Secondary | ICD-10-CM | POA: Diagnosis not present

## 2019-06-27 DIAGNOSIS — G4733 Obstructive sleep apnea (adult) (pediatric): Secondary | ICD-10-CM | POA: Diagnosis not present

## 2019-07-02 DIAGNOSIS — R29898 Other symptoms and signs involving the musculoskeletal system: Secondary | ICD-10-CM | POA: Diagnosis not present

## 2019-07-02 DIAGNOSIS — M5136 Other intervertebral disc degeneration, lumbar region: Secondary | ICD-10-CM | POA: Diagnosis not present

## 2019-07-13 DIAGNOSIS — G5603 Carpal tunnel syndrome, bilateral upper limbs: Secondary | ICD-10-CM | POA: Diagnosis not present

## 2019-07-13 DIAGNOSIS — R270 Ataxia, unspecified: Secondary | ICD-10-CM | POA: Diagnosis not present

## 2019-07-13 DIAGNOSIS — R200 Anesthesia of skin: Secondary | ICD-10-CM | POA: Diagnosis not present

## 2019-07-15 DIAGNOSIS — G5603 Carpal tunnel syndrome, bilateral upper limbs: Secondary | ICD-10-CM | POA: Insufficient documentation

## 2019-07-15 DIAGNOSIS — R270 Ataxia, unspecified: Secondary | ICD-10-CM | POA: Insufficient documentation

## 2019-07-15 HISTORY — DX: Carpal tunnel syndrome, bilateral upper limbs: G56.03

## 2019-07-21 DIAGNOSIS — G4733 Obstructive sleep apnea (adult) (pediatric): Secondary | ICD-10-CM | POA: Diagnosis not present

## 2019-07-27 DIAGNOSIS — M5020 Other cervical disc displacement, unspecified cervical region: Secondary | ICD-10-CM | POA: Diagnosis not present

## 2019-07-27 DIAGNOSIS — I10 Essential (primary) hypertension: Secondary | ICD-10-CM | POA: Diagnosis not present

## 2019-07-27 DIAGNOSIS — R739 Hyperglycemia, unspecified: Secondary | ICD-10-CM | POA: Diagnosis not present

## 2019-07-27 DIAGNOSIS — G4733 Obstructive sleep apnea (adult) (pediatric): Secondary | ICD-10-CM | POA: Diagnosis not present

## 2019-07-27 DIAGNOSIS — E7849 Other hyperlipidemia: Secondary | ICD-10-CM | POA: Diagnosis not present

## 2019-08-03 DIAGNOSIS — E7849 Other hyperlipidemia: Secondary | ICD-10-CM | POA: Diagnosis not present

## 2019-08-03 DIAGNOSIS — G629 Polyneuropathy, unspecified: Secondary | ICD-10-CM | POA: Diagnosis not present

## 2019-08-03 DIAGNOSIS — R739 Hyperglycemia, unspecified: Secondary | ICD-10-CM | POA: Diagnosis not present

## 2019-08-03 DIAGNOSIS — Z6841 Body Mass Index (BMI) 40.0 and over, adult: Secondary | ICD-10-CM | POA: Diagnosis not present

## 2019-08-03 DIAGNOSIS — I10 Essential (primary) hypertension: Secondary | ICD-10-CM | POA: Diagnosis not present

## 2019-08-03 DIAGNOSIS — Z780 Asymptomatic menopausal state: Secondary | ICD-10-CM | POA: Diagnosis not present

## 2019-08-03 DIAGNOSIS — J449 Chronic obstructive pulmonary disease, unspecified: Secondary | ICD-10-CM | POA: Diagnosis not present

## 2019-08-03 DIAGNOSIS — E559 Vitamin D deficiency, unspecified: Secondary | ICD-10-CM | POA: Diagnosis not present

## 2019-08-03 DIAGNOSIS — G4733 Obstructive sleep apnea (adult) (pediatric): Secondary | ICD-10-CM | POA: Diagnosis not present

## 2019-08-03 DIAGNOSIS — Z0000 Encounter for general adult medical examination without abnormal findings: Secondary | ICD-10-CM | POA: Diagnosis not present

## 2019-08-16 DIAGNOSIS — R270 Ataxia, unspecified: Secondary | ICD-10-CM | POA: Diagnosis not present

## 2019-08-16 DIAGNOSIS — R200 Anesthesia of skin: Secondary | ICD-10-CM | POA: Diagnosis not present

## 2019-08-16 DIAGNOSIS — G5603 Carpal tunnel syndrome, bilateral upper limbs: Secondary | ICD-10-CM | POA: Diagnosis not present

## 2019-08-19 DIAGNOSIS — M8588 Other specified disorders of bone density and structure, other site: Secondary | ICD-10-CM | POA: Diagnosis not present

## 2019-08-19 DIAGNOSIS — R200 Anesthesia of skin: Secondary | ICD-10-CM | POA: Insufficient documentation

## 2019-09-20 ENCOUNTER — Ambulatory Visit: Payer: PPO | Attending: Internal Medicine

## 2019-09-20 DIAGNOSIS — Z23 Encounter for immunization: Secondary | ICD-10-CM | POA: Insufficient documentation

## 2019-09-20 NOTE — Progress Notes (Signed)
   Covid-19 Vaccination Clinic  Name:  KATHY ZEPEDA    MRN: [id number] DOB: 03/22/1946  09/20/2019  Ms. Kegg was observed post Covid-19 immunization for 15 minutes without incidence. She was provided with Vaccine Information Sheet and instruction to access the V-Safe system.   Ms. Ying was instructed to call 911 with any severe reactions post vaccine: Marland Kitchen Difficulty breathing  . Swelling of your face and throat  . A fast heartbeat  . A bad rash all over your body  . Dizziness and weakness    Immunizations Administered    Name Date Dose VIS Date Route   Pfizer COVID-19 Vaccine 09/20/2019 10:33 AM 0.3 mL 07/23/2019 Intramuscular   Manufacturer: [place]   Lot: [id number]   [place]: [id number]

## 2019-10-07 ENCOUNTER — Ambulatory Visit: Payer: Self-pay

## 2019-10-15 ENCOUNTER — Ambulatory Visit: Payer: PPO | Attending: Internal Medicine

## 2019-10-15 DIAGNOSIS — Z23 Encounter for immunization: Secondary | ICD-10-CM

## 2019-10-15 NOTE — Progress Notes (Signed)
   Covid-19 Vaccination Clinic  Name:  Laura Hale    MRN: [id number] DOB: 27-Oct-1945  10/15/2019  Laura Hale was observed post Covid-19 immunization for 15 minutes without incident. She was provided with Vaccine Information Sheet and instruction to access the V-Safe system.   Laura Hale was instructed to call 911 with any severe reactions post vaccine: Marland Kitchen Difficulty breathing  . Swelling of face and throat  . A fast heartbeat  . A bad rash all over body  . Dizziness and weakness   Immunizations Administered    Name Date Dose VIS Date Route   Pfizer COVID-19 Vaccine 10/15/2019  8:58 AM 0.3 mL 07/23/2019 Intramuscular   Manufacturer: [place]   Lot: [id number]   [place]: [id number]

## 2019-10-20 DIAGNOSIS — G4733 Obstructive sleep apnea (adult) (pediatric): Secondary | ICD-10-CM | POA: Diagnosis not present

## 2020-01-25 DIAGNOSIS — J449 Chronic obstructive pulmonary disease, unspecified: Secondary | ICD-10-CM | POA: Diagnosis not present

## 2020-01-25 DIAGNOSIS — G4733 Obstructive sleep apnea (adult) (pediatric): Secondary | ICD-10-CM | POA: Diagnosis not present

## 2020-01-25 DIAGNOSIS — E7849 Other hyperlipidemia: Secondary | ICD-10-CM | POA: Diagnosis not present

## 2020-01-25 DIAGNOSIS — G629 Polyneuropathy, unspecified: Secondary | ICD-10-CM | POA: Diagnosis not present

## 2020-01-25 DIAGNOSIS — R739 Hyperglycemia, unspecified: Secondary | ICD-10-CM | POA: Diagnosis not present

## 2020-02-01 DIAGNOSIS — J449 Chronic obstructive pulmonary disease, unspecified: Secondary | ICD-10-CM | POA: Diagnosis not present

## 2020-02-01 DIAGNOSIS — E559 Vitamin D deficiency, unspecified: Secondary | ICD-10-CM | POA: Diagnosis not present

## 2020-02-01 DIAGNOSIS — R739 Hyperglycemia, unspecified: Secondary | ICD-10-CM | POA: Diagnosis not present

## 2020-02-01 DIAGNOSIS — E7849 Other hyperlipidemia: Secondary | ICD-10-CM | POA: Diagnosis not present

## 2020-02-01 DIAGNOSIS — Z6841 Body Mass Index (BMI) 40.0 and over, adult: Secondary | ICD-10-CM | POA: Diagnosis not present

## 2020-02-01 DIAGNOSIS — M5020 Other cervical disc displacement, unspecified cervical region: Secondary | ICD-10-CM | POA: Diagnosis not present

## 2020-02-01 DIAGNOSIS — I10 Essential (primary) hypertension: Secondary | ICD-10-CM | POA: Diagnosis not present

## 2020-02-01 DIAGNOSIS — G4733 Obstructive sleep apnea (adult) (pediatric): Secondary | ICD-10-CM | POA: Diagnosis not present

## 2020-02-01 DIAGNOSIS — M25552 Pain in left hip: Secondary | ICD-10-CM | POA: Diagnosis not present

## 2020-02-01 DIAGNOSIS — M1612 Unilateral primary osteoarthritis, left hip: Secondary | ICD-10-CM | POA: Diagnosis not present

## 2020-02-15 DIAGNOSIS — S42251D Displaced fracture of greater tuberosity of right humerus, subsequent encounter for fracture with routine healing: Secondary | ICD-10-CM | POA: Diagnosis not present

## 2020-02-15 DIAGNOSIS — S42224D 2-part nondisplaced fracture of surgical neck of right humerus, subsequent encounter for fracture with routine healing: Secondary | ICD-10-CM | POA: Diagnosis not present

## 2020-02-15 DIAGNOSIS — M1611 Unilateral primary osteoarthritis, right hip: Secondary | ICD-10-CM | POA: Diagnosis not present

## 2020-02-15 DIAGNOSIS — M25511 Pain in right shoulder: Secondary | ICD-10-CM | POA: Diagnosis not present

## 2020-02-25 DIAGNOSIS — M25552 Pain in left hip: Secondary | ICD-10-CM | POA: Diagnosis not present

## 2020-02-25 DIAGNOSIS — G8929 Other chronic pain: Secondary | ICD-10-CM | POA: Diagnosis not present

## 2020-03-06 ENCOUNTER — Other Ambulatory Visit: Payer: Self-pay | Admitting: Physician Assistant

## 2020-03-06 DIAGNOSIS — M1612 Unilateral primary osteoarthritis, left hip: Secondary | ICD-10-CM

## 2020-03-22 ENCOUNTER — Ambulatory Visit
Admission: RE | Admit: 2020-03-22 | Discharge: 2020-03-22 | Disposition: A | Discharge status: Home / Self Care | Payer: PPO | Source: Ambulatory Visit | Attending: Physician Assistant | Admitting: Physician Assistant

## 2020-03-22 ENCOUNTER — Other Ambulatory Visit: Payer: Self-pay

## 2020-03-22 DIAGNOSIS — M25452 Effusion, left hip: Secondary | ICD-10-CM | POA: Diagnosis not present

## 2020-03-22 DIAGNOSIS — S73192A Other sprain of left hip, initial encounter: Secondary | ICD-10-CM | POA: Diagnosis not present

## 2020-03-22 DIAGNOSIS — M1612 Unilateral primary osteoarthritis, left hip: Secondary | ICD-10-CM | POA: Diagnosis not present

## 2020-03-22 DIAGNOSIS — M87852 Other osteonecrosis, left femur: Secondary | ICD-10-CM | POA: Diagnosis not present

## 2020-03-28 DIAGNOSIS — M1612 Unilateral primary osteoarthritis, left hip: Secondary | ICD-10-CM | POA: Diagnosis not present

## 2020-03-28 DIAGNOSIS — M87052 Idiopathic aseptic necrosis of left femur: Secondary | ICD-10-CM | POA: Diagnosis not present

## 2020-04-02 DIAGNOSIS — M87052 Idiopathic aseptic necrosis of left femur: Secondary | ICD-10-CM | POA: Insufficient documentation

## 2020-04-02 DIAGNOSIS — M1612 Unilateral primary osteoarthritis, left hip: Secondary | ICD-10-CM | POA: Insufficient documentation

## 2020-04-02 DIAGNOSIS — M1611 Unilateral primary osteoarthritis, right hip: Secondary | ICD-10-CM | POA: Insufficient documentation

## 2020-04-02 HISTORY — DX: Idiopathic aseptic necrosis of left femur: M87.052

## 2020-04-12 ENCOUNTER — Other Ambulatory Visit: Payer: PPO

## 2020-04-12 ENCOUNTER — Other Ambulatory Visit: Payer: Self-pay | Admitting: Sleep Medicine

## 2020-04-12 DIAGNOSIS — I471 Supraventricular tachycardia, unspecified: Secondary | ICD-10-CM

## 2020-04-14 LAB — NOVEL CORONAVIRUS, NAA: SARS-CoV-2, NAA: NOT DETECTED

## 2020-04-21 ENCOUNTER — Other Ambulatory Visit: Payer: Self-pay | Admitting: Internal Medicine

## 2020-04-21 DIAGNOSIS — Z1231 Encounter for screening mammogram for malignant neoplasm of breast: Secondary | ICD-10-CM

## 2020-05-02 ENCOUNTER — Other Ambulatory Visit: Payer: Self-pay

## 2020-05-02 ENCOUNTER — Encounter: Payer: Self-pay | Admitting: Dermatology

## 2020-05-02 ENCOUNTER — Ambulatory Visit: Payer: PPO | Admitting: Dermatology

## 2020-05-02 DIAGNOSIS — L820 Inflamed seborrheic keratosis: Secondary | ICD-10-CM

## 2020-05-02 DIAGNOSIS — D489 Neoplasm of uncertain behavior, unspecified: Secondary | ICD-10-CM

## 2020-05-02 DIAGNOSIS — D485 Neoplasm of uncertain behavior of skin: Secondary | ICD-10-CM | POA: Diagnosis not present

## 2020-05-02 DIAGNOSIS — L821 Other seborrheic keratosis: Secondary | ICD-10-CM | POA: Diagnosis not present

## 2020-05-02 DIAGNOSIS — D2239 Melanocytic nevi of other parts of face: Secondary | ICD-10-CM | POA: Diagnosis not present

## 2020-05-02 DIAGNOSIS — L578 Other skin changes due to chronic exposure to nonionizing radiation: Secondary | ICD-10-CM

## 2020-05-02 DIAGNOSIS — D229 Melanocytic nevi, unspecified: Secondary | ICD-10-CM | POA: Diagnosis not present

## 2020-05-02 DIAGNOSIS — L814 Other melanin hyperpigmentation: Secondary | ICD-10-CM | POA: Diagnosis not present

## 2020-05-02 DIAGNOSIS — L738 Other specified follicular disorders: Secondary | ICD-10-CM

## 2020-05-02 DIAGNOSIS — Z1283 Encounter for screening for malignant neoplasm of skin: Secondary | ICD-10-CM

## 2020-05-02 DIAGNOSIS — Z85828 Personal history of other malignant neoplasm of skin: Secondary | ICD-10-CM

## 2020-05-02 DIAGNOSIS — L918 Other hypertrophic disorders of the skin: Secondary | ICD-10-CM | POA: Diagnosis not present

## 2020-05-02 NOTE — Patient Instructions (Signed)

## 2020-05-02 NOTE — Progress Notes (Signed)
Follow-Up Visit   Subjective  Laura Hale is a 74 y.o. female who presents for the following: TBSE. She has a history of BCC of the nasal tip that was treated around 2006. She has growths under her breasts and neck that have been irritated off and on.  The following portions of the chart were reviewed this encounter and updated as appropriate:      Review of Systems:  No other skin or systemic complaints except as noted in HPI or Assessment and Plan.  Objective  Well appearing patient in no apparent distress; mood and affect are within normal limits.  A full examination was performed including scalp, head, eyes, ears, nose, lips, neck, chest, axillae, abdomen, back, buttocks, bilateral upper extremities, bilateral lower extremities, hands, feet, fingers, toes, fingernails, and toenails. All findings within normal limits unless otherwise noted below.  Objective  Right Inframammary x 10, L inframammary x 7, Neck x 12, R axilla x 1 (30): Erythematous keratotic or waxy stuck-on papule   Objective  Right Nasal Ala: 5.0 mm flesh papule     Objective  Left Upper Medial Thigh: 8.0 mm fleshy papule   Assessment & Plan   Skin cancer screening performed today.  Actinic Damage - diffuse scaly erythematous macules with underlying dyspigmentation - Recommend daily broad spectrum sunscreen SPF 30+ to sun-exposed areas, reapply every 2 hours as needed.  - Call for new or changing lesions.  History of Basal Cell Carcinoma of the Skin - No evidence of recurrence today on nasal tip - Recommend regular full body skin exams - Recommend daily broad spectrum sunscreen SPF 30+ to sun-exposed areas, reapply every 2 hours as needed.  - Call if any new or changing lesions are noted between office visits  Seborrheic Keratoses - Stuck-on, waxy, tan-brown papules and plaques  - Discussed benign etiology and prognosis. - Observe - Call for any changes  Lentigines - Scattered tan  macules - Discussed due to sun exposure - Benign, observe - Call for any changes  Acrochordons (Skin Tags) - Fleshy, skin-colored pedunculated papules - Benign appearing.  - Observe. - If desired, they can be removed with an in office procedure that is not covered by insurance. - Please call the clinic if you notice any new or changing lesions.  Melanocytic Nevi - Tan-brown and/or pink-flesh-colored symmetric macules and papules - Benign appearing on exam today - Observation - Call clinic for new or changing moles - Recommend daily use of broad spectrum spf 30+ sunscreen to sun-exposed areas.   Sebaceous Hyperplasia - Small yellow papules with a central dell - Benign - Observe   Inflamed seborrheic keratosis (30) Right Inframammary x 10, L inframammary x 7, Neck x 12, R axilla x 1  Destruction of lesion - Right Inframammary x 10, L inframammary x 7, Neck x 12, R axilla x 1  Destruction method: cryotherapy   Informed consent: discussed and consent obtained   Lesion destroyed using liquid nitrogen: Yes   Region frozen until ice ball extended beyond lesion: Yes   Outcome: patient tolerated procedure well with no complications   Post-procedure details: wound care instructions given    Neoplasm of uncertain behavior Right Nasal Ala  Epidermal / dermal shaving  Lesion diameter (cm):  0.5 Informed consent: discussed and consent obtained   Patient was prepped and draped in usual sterile fashion: Area prepped with alcohol. Anesthesia: the lesion was anesthetized in a standard fashion   Anesthetic:  1% lidocaine w/ epinephrine 1-100,000 buffered  w/ 8.4% NaHCO3 Instrument used: flexible razor blade   Hemostasis achieved with: pressure, aluminum chloride and electrodesiccation   Outcome: patient tolerated procedure well   Post-procedure details: wound care instructions given   Post-procedure details comment:  Ointment and small bandage applied  Specimen 1 - Surgical  pathology Differential Diagnosis: Irritated Nevus vs Other Check Margins: No 5.0 mm flesh papule  Neoplasm of uncertain behavior of skin Left Upper Medial Thigh  Epidermal / dermal shaving  Lesion diameter (cm):  0.8 Informed consent: discussed and consent obtained   Patient was prepped and draped in usual sterile fashion: Area prepped with alcohol. Anesthesia: the lesion was anesthetized in a standard fashion   Anesthetic:  1% lidocaine w/ epinephrine 1-100,000 buffered w/ 8.4% NaHCO3 Instrument used: flexible razor blade   Hemostasis achieved with: pressure, aluminum chloride and electrodesiccation   Outcome: patient tolerated procedure well   Post-procedure details: wound care instructions given   Post-procedure details comment:  Ointment and small bandage applied  Specimen 2 - Surgical pathology Differential Diagnosis: Irritated Acrochordon vs Irritated Nevus Check Margins: No 8.0 mm fleshy papule  Return in about 1 year (around 05/02/2021) for TBSE.   IJamesetta Orleans, CMA, am acting as scribe for Brendolyn Patty, MD .  Documentation: I have reviewed the above documentation for accuracy and completeness, and I agree with the above.  Brendolyn Patty MD

## 2020-05-04 DIAGNOSIS — H04123 Dry eye syndrome of bilateral lacrimal glands: Secondary | ICD-10-CM | POA: Diagnosis not present

## 2020-05-10 ENCOUNTER — Telehealth: Payer: Self-pay

## 2020-05-10 NOTE — Telephone Encounter (Signed)
-----   Message from Brendolyn Patty, MD sent at 05/09/2020  7:29 PM EDT ----- 1. Skin , right nasal ala MELANOCYTIC NEVUS, INTRADERMAL TYPE 2. Skin , left upper medial thigh ACROCHORDON  1. Benign mole 2. Benign skin tag

## 2020-05-10 NOTE — Telephone Encounter (Signed)
Left pt message advising of bx results./sh 

## 2020-06-06 ENCOUNTER — Emergency Department: Payer: PPO

## 2020-06-06 ENCOUNTER — Encounter: Payer: Self-pay | Admitting: Emergency Medicine

## 2020-06-06 ENCOUNTER — Emergency Department
Admission: EM | Admit: 2020-06-06 | Discharge: 2020-06-07 | Disposition: A | Discharge status: Home / Self Care | Payer: PPO | Attending: Student in an Organized Health Care Education/Training Program | Admitting: Student in an Organized Health Care Education/Training Program

## 2020-06-06 ENCOUNTER — Ambulatory Visit
Admission: RE | Admit: 2020-06-06 | Discharge: 2020-06-06 | Disposition: A | Discharge status: Home / Self Care | Payer: PPO | Source: Ambulatory Visit | Attending: Internal Medicine | Admitting: Internal Medicine

## 2020-06-06 ENCOUNTER — Other Ambulatory Visit: Payer: Self-pay

## 2020-06-06 DIAGNOSIS — J449 Chronic obstructive pulmonary disease, unspecified: Secondary | ICD-10-CM | POA: Insufficient documentation

## 2020-06-06 DIAGNOSIS — S199XXA Unspecified injury of neck, initial encounter: Secondary | ICD-10-CM | POA: Diagnosis not present

## 2020-06-06 DIAGNOSIS — W010XXA Fall on same level from slipping, tripping and stumbling without subsequent striking against object, initial encounter: Secondary | ICD-10-CM | POA: Insufficient documentation

## 2020-06-06 DIAGNOSIS — S8002XA Contusion of left knee, initial encounter: Secondary | ICD-10-CM | POA: Diagnosis not present

## 2020-06-06 DIAGNOSIS — Y92481 Parking lot as the place of occurrence of the external cause: Secondary | ICD-10-CM | POA: Insufficient documentation

## 2020-06-06 DIAGNOSIS — S0181XA Laceration without foreign body of other part of head, initial encounter: Secondary | ICD-10-CM

## 2020-06-06 DIAGNOSIS — Z1231 Encounter for screening mammogram for malignant neoplasm of breast: Secondary | ICD-10-CM | POA: Diagnosis not present

## 2020-06-06 DIAGNOSIS — S01112A Laceration without foreign body of left eyelid and periocular area, initial encounter: Secondary | ICD-10-CM | POA: Diagnosis not present

## 2020-06-06 DIAGNOSIS — Z85828 Personal history of other malignant neoplasm of skin: Secondary | ICD-10-CM | POA: Diagnosis not present

## 2020-06-06 DIAGNOSIS — Y9301 Activity, walking, marching and hiking: Secondary | ICD-10-CM | POA: Diagnosis not present

## 2020-06-06 DIAGNOSIS — R918 Other nonspecific abnormal finding of lung field: Secondary | ICD-10-CM

## 2020-06-06 DIAGNOSIS — Z87891 Personal history of nicotine dependence: Secondary | ICD-10-CM | POA: Diagnosis not present

## 2020-06-06 DIAGNOSIS — S0993XA Unspecified injury of face, initial encounter: Secondary | ICD-10-CM | POA: Diagnosis not present

## 2020-06-06 DIAGNOSIS — R911 Solitary pulmonary nodule: Secondary | ICD-10-CM | POA: Insufficient documentation

## 2020-06-06 DIAGNOSIS — J45909 Unspecified asthma, uncomplicated: Secondary | ICD-10-CM | POA: Diagnosis not present

## 2020-06-06 DIAGNOSIS — Z79899 Other long term (current) drug therapy: Secondary | ICD-10-CM | POA: Insufficient documentation

## 2020-06-06 DIAGNOSIS — S81012A Laceration without foreign body, left knee, initial encounter: Secondary | ICD-10-CM | POA: Diagnosis not present

## 2020-06-06 DIAGNOSIS — S0990XA Unspecified injury of head, initial encounter: Secondary | ICD-10-CM | POA: Diagnosis not present

## 2020-06-06 MED ORDER — SULFAMETHOXAZOLE-TRIMETHOPRIM 800-160 MG PO TABS
1.0000 | ORAL_TABLET | Freq: Once | ORAL | Status: DC
  Filled 2020-06-06: qty 1

## 2020-06-06 MED ORDER — LIDOCAINE HCL (PF) 1 % IJ SOLN
5.0000 mL | Freq: Once | INTRAMUSCULAR | Status: AC
  Administered 2020-06-06: 5 mL
  Filled 2020-06-06: qty 5

## 2020-06-06 MED ORDER — SULFAMETHOXAZOLE-TRIMETHOPRIM 800-160 MG PO TABS
1.0000 | ORAL_TABLET | Freq: Once | ORAL | Status: AC
  Administered 2020-06-07: 1 via ORAL

## 2020-06-06 NOTE — ED Triage Notes (Signed)
Pt to ED after fall tonight.  States was walking across the parking lot going to dinner and tripped on her cane and hit face on pavement.  Lac to left eyebrow, nose and lip.  Denies LOC or dizziness prior to fall.

## 2020-06-07 DIAGNOSIS — R918 Other nonspecific abnormal finding of lung field: Secondary | ICD-10-CM

## 2020-06-07 HISTORY — DX: Other nonspecific abnormal finding of lung field: R91.8

## 2020-06-07 MED ORDER — SULFAMETHOXAZOLE-TRIMETHOPRIM 800-160 MG PO TABS: 1.0000 | ORAL_TABLET | Freq: Two times a day (BID) | ORAL | 0 refills | Status: AC

## 2020-06-07 NOTE — ED Provider Notes (Signed)
[facility] Emergency Department Provider Note  ____________________________________________   First MD Initiated Contact with Patient 06/06/20 2132     (approximate)  I have reviewed the triage vital signs and the nursing notes.   HISTORY  Chief Complaint Fall  HPI Laura Hale is a 74 y.o. female who presents to the emergency department for evaluation after a mechanical fall in a parking lot this evening.  The patient was walking with a cane secondary to pending left hip surgery and tripped she believes on the cane and fell face forward in the parking lot.  She struck her face as well as her left knee.  Pain is currently rated 3/10 in the face and knee.  She denies any loss of consciousness, dizziness, blurred vision or weakness.  She has not yet tried any alleviating factors.  Nothing makes it worse.         Past Medical History:  Diagnosis Date   Arthritis    elbow, hands, neck    Asthma    allergy induced asthma-only exacerbated by smells like perfume, smoke etc   Atypical childhood psychosis 2007   myoview -negative for ischemia with preserved LV function   Basal cell carcinoma ~2006   nasal tip   Colon polyps    Complication of anesthesia    WITH ACDF IN JUNE 2016-PT STATES SHE LOST HER VOICE X 10 WEEKS DUE TO INTUBATION   COPD (chronic obstructive pulmonary disease) ([place])    Edema    LEGS/FEET   Hypercalcemia 2009   calcium  WNL 10.2 on 04-2015   Hyperlipidemia    Obesity    Urinary incontinence     Patient Active Problem List   Diagnosis Date Noted   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    Past Surgical History:  Procedure Laterality Date   ABDOMINAL HYSTERECTOMY     ANTERIOR CERVICAL DECOMP/DISCECTOMY FUSION N/A 01/31/2015   Procedure: ANTERIOR CERVICAL DECOMPRESSION/DISCECTOMY FUSION CERVICAL FIVE SIX;  Surgeon: Karie Chimera, MD;  Location: [place] [facility];  Service: Neurosurgery;  Laterality: N/A;   BACK  SURGERY     CATARACT EXTRACTION W/PHACO Left 02/06/2017   Procedure: CATARACT EXTRACTION PHACO AND INTRAOCULAR LENS PLACEMENT (IOC);  Surgeon: Eulogio Bear, MD;  Location: [facility] ORS;  Service: Ophthalmology;  Laterality: Left;  Lot# [id number] H [place]: 00:37.9 AP%: 7.9 CDE: 2.97   CATARACT EXTRACTION W/PHACO Right 03/06/2017   Procedure: CATARACT EXTRACTION PHACO AND INTRAOCULAR LENS PLACEMENT (IOC);  Surgeon: Eulogio Bear, MD;  Location: [facility] ORS;  Service: Ophthalmology;  Laterality: Right;  Lot #[id number] H [place]: 00.22.0 AP%: 7.5 CDE: 1.64    COLONOSCOPY     COLONOSCOPY WITH PROPOFOL N/A 09/23/2017   Procedure: COLONOSCOPY WITH PROPOFOL;  Surgeon: Toledo, Benay Pike, MD;  Location: [facility] ENDOSCOPY;  Service: Gastroenterology;  Laterality: N/A;   ELBOW SURGERY Left 04/2003   x2  & for repair & then remove all hardware, due to injury from a fall   [place] Right 2006   injury- then eventually lost remainder of thumb   KNEE ARTHROSCOPY WITH LATERAL MENISECTOMY Left 01/23/2016   Procedure: KNEE ARTHROSCOPY WITH LATERAL MENISECTOMY;  Surgeon: Corky Mull, MD;  Location: [facility] ORS;  Service: Orthopedics;  Laterality: Left;   ORIF HUMERUS FRACTURE Right 08/31/2015   Procedure: OPEN REDUCTION INTERNAL FIXATION (ORIF) PROXIMAL HUMERUS FRACTURE w/ #2 fberwire;  Surgeon: Corky Mull, MD;  Location: [facility] ORS;  Service: Orthopedics;  Laterality: Right;   THUMB AMPUTATION  2005   partial    Prior to Admission medications   Medication Sig Start Date End Date Taking? Authorizing Provider  acetaminophen (TYLENOL) 500 MG tablet Take 500 mg by mouth every 6 (six) hours as needed for moderate pain. Reported on 10/12/2015    [provider]  atorvastatin (LIPITOR) 80 MG tablet Take 40 mg by mouth every morning.  01/03/15   [provider]  azelastine (ASTELIN) 0.1 % nasal spray Place 1 spray into both nostrils daily as needed for allergies.  Reported on 01/23/2016 01/03/15   [provider]  cetirizine (ZYRTEC) 10 MG tablet Take 10 mg by mouth daily.    [provider]  hydrochlorothiazide (HYDRODIURIL) 25 MG tablet Take 25 mg by mouth daily. 03/30/20   [provider]  ibuprofen (ADVIL,MOTRIN) 200 MG tablet Take 200 mg by mouth every 6 (six) hours as needed for moderate pain.    [provider]  Multiple Vitamin (MULTIVITAMIN) tablet Take 1 tablet by mouth daily.    [provider]  naproxen (NAPROSYN) 500 MG tablet Take 500 mg by mouth 2 (two) times daily with a meal.    [provider]  sulfamethoxazole-trimethoprim (BACTRIM DS) 800-160 MG tablet Take 1 tablet by mouth 2 (two) times daily for 10 days. 06/07/20 06/17/20  Marlana Salvage, PA    Allergies Patient has no known allergies.  Family History  Problem Relation Age of Onset   Breast cancer Sister 31    Social History Social History   Tobacco Use   Smoking status: Former Smoker    Packs/day: 0.50    Types: Cigarettes    Quit date: 08/27/1984    Years since quitting: 35.8   Smokeless tobacco: Former Systems developer    Quit date: 11/26/1984   Tobacco comment: no smokers in home currently  Vaping Use   Vaping Use: Never used  Substance Use Topics   Alcohol use: Yes    Comment: 5-7 glasses of wine  in a week    Drug use: No    Review of Systems Constitutional: No fever/chills Eyes: No visual changes. ENT: No sore throat. Cardiovascular: Denies chest pain. Respiratory: Denies shortness of breath. Gastrointestinal: No abdominal pain.  No nausea, no vomiting.  No diarrhea.  No constipation. Genitourinary: Negative for dysuria. Musculoskeletal: + Left knee pain negative for back pain. Skin: + Facial wound, negative for rash. Neurological: Negative for headaches, focal weakness or numbness.   ____________________________________________   PHYSICAL EXAM:  VITAL SIGNS: ED Triage Vitals  Enc Vitals Group       BP 06/06/20 2005 (!) 177/75     Pulse Rate 06/06/20 2005 77     Resp 06/06/20 2005 18     Temp 06/06/20 2005 98.5 F (36.9 C)     Temp Source 06/06/20 2005 Oral     SpO2 06/06/20 2005 97 %     Weight 06/06/20 2005 244 lb (110.7 kg)     Height 06/06/20 2005 5' 5.5" (1.664 m)     Head Circumference --      Peak Flow --      Pain Score 06/06/20 2016 3     Pain Loc --      Pain Edu? --      Excl. in [place]? --    Constitutional: Alert and oriented. Well appearing and in no acute distress. Eyes: Conjunctivae are normal. PERRL. EOMI. Head: Multiple wounds to the face as described below Nose:  No congestion/rhinnorhea. Mouth/Throat: Mucous membranes are moist.   Neck: No stridor.  No cervical spine tenderness to palpation, no paraspinal tenderness. Cardiovascular: Normal rate, regular rhythm. Grossly normal heart sounds.  Good peripheral circulation. Respiratory: Normal respiratory effort.  No retractions. Lungs CTAB. Gastrointestinal: Soft and nontender. No distention. No abdominal bruits. No CVA tenderness. Musculoskeletal: There is some swelling and tenderness to the anterior aspect of the left knee with some early ecchymosis changes.  The patient has full range of motion of the left knee. Neurologic:  Normal speech and language. No gross focal neurologic deficits are appreciated.  Skin: There is an approximately 1 inch laceration through the left eyebrow with controlled bleeding at this time.  There are 3 very small abrasions to the bridge area of the nose. Psychiatric: Mood and affect are normal. Speech and behavior are normal.  ____________________________________________  RADIOLOGY I, Marlana Salvage, personally viewed and evaluated these images (plain radiographs) as part of my medical decision making, as well as reviewing the written report by the radiologist.  ED provider interpretation: Left knee films reveal no acute fracture with some age expected degenerative  changes.  Official radiology report(s): CT Head Wo Contrast  Result Date: 06/06/2020 CLINICAL DATA:  Head trauma EXAM: CT HEAD WITHOUT CONTRAST TECHNIQUE: Contiguous axial images were obtained from the base of the skull through the vertex without intravenous contrast. COMPARISON:  None. FINDINGS: Brain: No evidence of acute territorial infarction, hemorrhage, hydrocephalus,extra-axial collection or mass lesion/mass effect. There is mild dilatation the ventricles and sulci consistent with age-related atrophy. Low-attenuation changes in the deep white matter consistent with small vessel ischemia. Vascular: No hyperdense vessel or unexpected calcification. Skull: The skull is intact. No fracture or focal lesion identified. Sinuses/Orbits: There is complete opacification the left maxillary sinus with ethmoid air cell mucosal thickening. The orbits and globes intact. Other: None Face: Osseous: No acute fracture or other significant osseous abnormality.The nasal bone, mandibles, zygomatic arches and pterygoid plates are intact. Orbits: No fracture identified. Unremarkable appearance of globes and orbits. Sinuses: There is complete opacification the left maxillary sinus and ethmoid air cell mucosal thickening. Soft tissues:  No acute findings. Limited intracranial: No acute findings. Cervical spine: Alignment: There is a minimal anterolisthesis of C3 on C4 and C4 on C5 measuring 2 mm. Skull base and vertebrae: Visualized skull base is intact. No atlanto-occipital dissociation. There is a prior ACDF at C6-C7. No periprosthetic lucency or fracture is identified. Soft tissues and spinal canal: The visualized paraspinal soft tissues are unremarkable. No prevertebral soft tissue swelling is seen. The spinal canal is grossly unremarkable, no large epidural collection or significant canal narrowing. Disc levels: Multilevel cervical spine spondylosis is seen with disc osteophyte complex and uncovertebral osteophytes most  notable at C5-C6 with mild neural foraminal narrowing. Upper chest: Bilateral biapical nodular opacities are noted. Within the left lung apex measures 5 mm within the right lung apex 4 mm. Thoracic inlet is within normal limits. Other: None IMPRESSION: 1. No acute intracranial abnormality. 2. Findings consistent with age related atrophy and chronic small vessel ischemia 3. No acute facial fracture 4. Findings of left maxillary sinusitis. 5. Status post ACDF at C6-C7.  No acute fracture seen. 6. Bilateral biapical 6 mm pulmonary nodules. would recommend dedicated nonemergent chest CT. Electronically Signed   By: Prudencio Pair M.D.   On: 06/06/2020 21:35   CT Cervical Spine Wo Contrast  Result Date: 06/06/2020 CLINICAL DATA:  Head trauma EXAM: CT HEAD WITHOUT CONTRAST TECHNIQUE: Contiguous axial images  were obtained from the base of the skull through the vertex without intravenous contrast. COMPARISON:  None. FINDINGS: Brain: No evidence of acute territorial infarction, hemorrhage, hydrocephalus,extra-axial collection or mass lesion/mass effect. There is mild dilatation the ventricles and sulci consistent with age-related atrophy. Low-attenuation changes in the deep white matter consistent with small vessel ischemia. Vascular: No hyperdense vessel or unexpected calcification. Skull: The skull is intact. No fracture or focal lesion identified. Sinuses/Orbits: There is complete opacification the left maxillary sinus with ethmoid air cell mucosal thickening. The orbits and globes intact. Other: None Face: Osseous: No acute fracture or other significant osseous abnormality.The nasal bone, mandibles, zygomatic arches and pterygoid plates are intact. Orbits: No fracture identified. Unremarkable appearance of globes and orbits. Sinuses: There is complete opacification the left maxillary sinus and ethmoid air cell mucosal thickening. Soft tissues:  No acute findings. Limited intracranial: No acute findings. Cervical  spine: Alignment: There is a minimal anterolisthesis of C3 on C4 and C4 on C5 measuring 2 mm. Skull base and vertebrae: Visualized skull base is intact. No atlanto-occipital dissociation. There is a prior ACDF at C6-C7. No periprosthetic lucency or fracture is identified. Soft tissues and spinal canal: The visualized paraspinal soft tissues are unremarkable. No prevertebral soft tissue swelling is seen. The spinal canal is grossly unremarkable, no large epidural collection or significant canal narrowing. Disc levels: Multilevel cervical spine spondylosis is seen with disc osteophyte complex and uncovertebral osteophytes most notable at C5-C6 with mild neural foraminal narrowing. Upper chest: Bilateral biapical nodular opacities are noted. Within the left lung apex measures 5 mm within the right lung apex 4 mm. Thoracic inlet is within normal limits. Other: None IMPRESSION: 1. No acute intracranial abnormality. 2. Findings consistent with age related atrophy and chronic small vessel ischemia 3. No acute facial fracture 4. Findings of left maxillary sinusitis. 5. Status post ACDF at C6-C7.  No acute fracture seen. 6. Bilateral biapical 6 mm pulmonary nodules. would recommend dedicated nonemergent chest CT. Electronically Signed   By: Prudencio Pair M.D.   On: 06/06/2020 21:35   DG Knee Complete 4 Views Left  Result Date: 06/06/2020 CLINICAL DATA:  Left knee pain after fall anterior laceration and pain EXAM: LEFT KNEE - COMPLETE 4+ VIEW COMPARISON:  Left knee MRI 01/16/2016 FINDINGS: Soft tissue swelling and thickening anterior to the patella likely corresponding to some contusive changes reported laceration. No soft tissue gas or foreign body is seen. No sizeable joint effusion. No acute bony abnormality. Specifically, no fracture, subluxation, or dislocation. Mild-to-moderate tricompartmental degenerative changes are present most pronounced in the patellofemoral compartment with joint space narrowing and  periarticular spurring. Some mineralization in the posterior soft tissues likely reflecting atherosclerotic calcification. IMPRESSION: 1. Soft tissue swelling and thickening anterior to the patella likely corresponding to some contusive changes and reported laceration. 2. No soft tissue gas or foreign body. 3. No acute osseous abnormality. 4. Mild-to-moderate tricompartmental degenerative changes. Electronically Signed   By: Lovena Le M.D.   On: 06/06/2020 23:29   CT Maxillofacial Wo Contrast  Result Date: 06/06/2020 CLINICAL DATA:  Head trauma EXAM: CT HEAD WITHOUT CONTRAST TECHNIQUE: Contiguous axial images were obtained from the base of the skull through the vertex without intravenous contrast. COMPARISON:  None. FINDINGS: Brain: No evidence of acute territorial infarction, hemorrhage, hydrocephalus,extra-axial collection or mass lesion/mass effect. There is mild dilatation the ventricles and sulci consistent with age-related atrophy. Low-attenuation changes in the deep white matter consistent with small vessel ischemia. Vascular: No hyperdense vessel or unexpected  calcification. Skull: The skull is intact. No fracture or focal lesion identified. Sinuses/Orbits: There is complete opacification the left maxillary sinus with ethmoid air cell mucosal thickening. The orbits and globes intact. Other: None Face: Osseous: No acute fracture or other significant osseous abnormality.The nasal bone, mandibles, zygomatic arches and pterygoid plates are intact. Orbits: No fracture identified. Unremarkable appearance of globes and orbits. Sinuses: There is complete opacification the left maxillary sinus and ethmoid air cell mucosal thickening. Soft tissues:  No acute findings. Limited intracranial: No acute findings. Cervical spine: Alignment: There is a minimal anterolisthesis of C3 on C4 and C4 on C5 measuring 2 mm. Skull base and vertebrae: Visualized skull base is intact. No atlanto-occipital dissociation. There is  a prior ACDF at C6-C7. No periprosthetic lucency or fracture is identified. Soft tissues and spinal canal: The visualized paraspinal soft tissues are unremarkable. No prevertebral soft tissue swelling is seen. The spinal canal is grossly unremarkable, no large epidural collection or significant canal narrowing. Disc levels: Multilevel cervical spine spondylosis is seen with disc osteophyte complex and uncovertebral osteophytes most notable at C5-C6 with mild neural foraminal narrowing. Upper chest: Bilateral biapical nodular opacities are noted. Within the left lung apex measures 5 mm within the right lung apex 4 mm. Thoracic inlet is within normal limits. Other: None IMPRESSION: 1. No acute intracranial abnormality. 2. Findings consistent with age related atrophy and chronic small vessel ischemia 3. No acute facial fracture 4. Findings of left maxillary sinusitis. 5. Status post ACDF at C6-C7.  No acute fracture seen. 6. Bilateral biapical 6 mm pulmonary nodules. would recommend dedicated nonemergent chest CT. Electronically Signed   By: Prudencio Pair M.D.   On: 06/06/2020 21:35    ____________________________________________   PROCEDURES  Procedure(s) performed (including Critical Care):  Marland KitchenMarland KitchenLaceration Repair  Date/Time: 06/07/2020 12:18 AM Performed by: Marlana Salvage, PA Authorized by: Marlana Salvage, PA   Consent:    Consent obtained:  Verbal   Consent given by:  Patient   Risks discussed:  Infection, poor cosmetic result and pain   Alternatives discussed:  No treatment Anesthesia (see MAR for exact dosages):    Anesthesia method:  Local infiltration   Local anesthetic:  Lidocaine 1% w/o epi Laceration details:    Location:  Face   Face location:  L eyebrow   Length (cm):  2.5   Depth (mm):  3 Repair type:    Repair type:  Simple Pre-procedure details:    Preparation:  Patient was prepped and draped in usual sterile fashion Exploration:    Hemostasis achieved with:   Direct pressure Treatment:    Area cleansed with:  Betadine and saline   Amount of cleaning:  Standard   Irrigation solution:  Sterile saline   Irrigation method:  Syringe Skin repair:    Repair method:  Sutures   Suture size:  6-0   Suture material:  Prolene   Suture technique:  Simple interrupted   Number of sutures:  4 Approximation:    Approximation:  Close Post-procedure details:    Dressing:  Open (no dressing)   Patient tolerance of procedure:  Tolerated well, no immediate complications     ____________________________________________   INITIAL IMPRESSION / ASSESSMENT AND PLAN / ED COURSE  As part of my medical decision making, I reviewed the following data within the [place] notes reviewed and incorporated, Radiograph reviewed and Notes from prior ED visits        Patient is a 74 year old female  who suffered a mechanical fall this evening in a parking lot striking her face as well as her left knee.  Her physical exam is grossly reassuring except for some lacerations to her face as well as some soft tissue swelling and tenderness to the anterior aspect of the left knee.  CT of the head, cervical spine and maxillofacial bones were performed which is negative for any acute intracranial or bony pathology.  There was an incidental finding of some bilateral nodules in the lung apices.  This was discussed with the patient and she will call her primary care tomorrow for follow-up on any further lung imaging.  In addition, images were performed of the left knee which are negative for any acute fracture.  The abrasions to the nose were cleaned but are not appropriate for repair.  There is a 1 inch laceration to the left eyebrow region which was cleaned and repaired with 4 sutures.  The patient was advised that she would need to have these removed in approximately 5 days.  She was also placed on prophylactic Bactrim to prevent any facial cellulitis or  infection.  The patient is amenable with this plan.  She will follow-up with primary care as well as Dr. Marry Guan who is preparing her for left hip replacement in 2 weeks.  The patient will return to the emergency department with any worsening.      ____________________________________________   FINAL CLINICAL IMPRESSION(S) / ED DIAGNOSES  Final diagnoses:  Facial laceration, initial encounter  Contusion of left knee, initial encounter  Pulmonary nodules     ED Discharge Orders         Ordered    sulfamethoxazole-trimethoprim (BACTRIM DS) 800-160 MG tablet  2 times daily        06/07/20 0007          *Please note:  Laura Hale was evaluated in Emergency Department on 06/07/2020 for the symptoms described in the history of present illness. She was evaluated in the context of the global COVID-19 pandemic, which necessitated consideration that the patient might be at risk for infection with the SARS-CoV-2 virus that causes COVID-19. Institutional protocols and algorithms that pertain to the evaluation of patients at risk for COVID-19 are in a state of rapid change based on information released by regulatory bodies including the CDC and federal and state organizations. These policies and algorithms were followed during the patient's care in the ED.  Some ED evaluations and interventions may be delayed as a result of limited staffing during and the pandemic.*   Note:  This document was prepared using Dragon voice recognition software and may include unintentional dictation errors.    Marlana Salvage, PA 06/07/20 [place]    Merlyn Lot, MD 06/07/20 [phone]

## 2020-06-08 ENCOUNTER — Other Ambulatory Visit (HOSPITAL_COMMUNITY): Payer: Self-pay | Admitting: Internal Medicine

## 2020-06-08 ENCOUNTER — Other Ambulatory Visit: Payer: Self-pay | Admitting: Internal Medicine

## 2020-06-08 DIAGNOSIS — R918 Other nonspecific abnormal finding of lung field: Secondary | ICD-10-CM

## 2020-06-11 NOTE — Discharge Instructions (Signed)
Instructions after Total Hip Replacement     Lachina Salsberry P. Neshia Mckenzie, Jr., M.D.     Dept. of Orthopaedics & Sports Medicine  [facility]  [address]  [place], [place]  [address]  Phone: [phone]   Fax: [phone]    DIET: . Drink plenty of non-alcoholic fluids. . Resume your normal diet. Include foods high in fiber.  ACTIVITY:  . You may use crutches or a walker with weight-bearing as tolerated, unless instructed otherwise. . You may be weaned off of the walker or crutches by your Physical Therapist.  . Do NOT reach below the level of your knees or cross your legs until allowed.    . Continue doing gentle exercises. Exercising will reduce the pain and swelling, increase motion, and prevent muscle weakness.   . Please continue to use the TED compression stockings for 6 weeks. You may remove the stockings at night, but should reapply them in the morning. . Do not drive or operate any equipment until instructed.  WOUND CARE:  . Continue to use ice packs periodically to reduce pain and swelling. . Keep the incision clean and dry. . You may bathe or shower after the staples are removed at the first office visit following surgery.  MEDICATIONS: . You may resume your regular medications. . Please take the pain medication as prescribed on the medication. . Do not take pain medication on an empty stomach. . You have been given a prescription for a blood thinner to prevent blood clots. Please take the medication as instructed. (NOTE: After completing a 2 week course of Lovenox, take one Enteric-coated aspirin once a day.) . Pain medications and iron supplements can cause constipation. Use a stool softener (Senokot or Colace) on a daily basis and a laxative (dulcolax or miralax) as needed. . Do not drive or drink alcoholic beverages when taking pain medications.  CALL THE OFFICE FOR: . Temperature above 101 degrees . Excessive bleeding or drainage on the dressing. . Excessive  swelling, coldness, or paleness of the toes. . Persistent nausea and vomiting.  FOLLOW-UP:  . You should have an appointment to return to the office in 6 weeks after surgery. . Arrangements have been made for continuation of Physical Therapy (either home therapy or outpatient therapy).     [facility] Department Directory         [email or web address]       [email or web address]          Cardiology  Appointments: [place] - [phone] [place] - [phone]  Endocrinology  Appointments: [place] - [phone] [place] - [phone]  Gastroenterology  Appointments: [place] - [phone] [place] - [phone]        General Surgery   Appointments: [place] - [phone]  Internal Medicine/Family Medicine  Appointments: [place] - [phone] [place] - [phone] [place] - [phone]  Metabolic and Weigh Loss Surgery  Appointments: [place] - [phone]        Neurology  Appointments: [place] - [phone] [place] - [phone]  Neurosurgery  Appointments: [place] - [phone]  Obstetrics & Gynecology  Appointments: [place] - [phone] [place] - [phone]        Pediatrics  Appointments: [place] - [phone] [place] - [phone]  Physiatry  Appointments: [place] -[phone]  Physical Therapy  Appointments: [place] - [phone] [place] - [phone]        Podiatry  Appointments: [place] - [phone] [place] - [phone]  Pulmonology  Appointments: [place] - [phone]  Rheumatology  Appointments:  - [phone]         Location: [facility]  [address] [place], [place]  [address]  [place] Location: [facility] [address] [place], [place]  [address]  [place] Location: [facility] [address] [place], [place]  [address]    

## 2020-06-12 ENCOUNTER — Other Ambulatory Visit: Payer: Self-pay

## 2020-06-12 ENCOUNTER — Ambulatory Visit
Admission: RE | Admit: 2020-06-12 | Discharge: 2020-06-12 | Disposition: A | Discharge status: Home / Self Care | Payer: PPO | Source: Ambulatory Visit | Attending: Internal Medicine | Admitting: Internal Medicine

## 2020-06-12 DIAGNOSIS — J8410 Pulmonary fibrosis, unspecified: Secondary | ICD-10-CM | POA: Diagnosis not present

## 2020-06-12 DIAGNOSIS — W010XXA Fall on same level from slipping, tripping and stumbling without subsequent striking against object, initial encounter: Secondary | ICD-10-CM | POA: Diagnosis not present

## 2020-06-12 DIAGNOSIS — M2578 Osteophyte, vertebrae: Secondary | ICD-10-CM | POA: Diagnosis not present

## 2020-06-12 DIAGNOSIS — M5134 Other intervertebral disc degeneration, thoracic region: Secondary | ICD-10-CM | POA: Diagnosis not present

## 2020-06-12 DIAGNOSIS — R918 Other nonspecific abnormal finding of lung field: Secondary | ICD-10-CM

## 2020-06-12 DIAGNOSIS — I2510 Atherosclerotic heart disease of native coronary artery without angina pectoris: Secondary | ICD-10-CM | POA: Diagnosis not present

## 2020-06-12 DIAGNOSIS — S0181XA Laceration without foreign body of other part of head, initial encounter: Secondary | ICD-10-CM | POA: Diagnosis not present

## 2020-06-13 DIAGNOSIS — M1612 Unilateral primary osteoarthritis, left hip: Secondary | ICD-10-CM | POA: Diagnosis not present

## 2020-06-15 ENCOUNTER — Other Ambulatory Visit: Payer: Self-pay

## 2020-06-15 ENCOUNTER — Encounter
Admission: RE | Admit: 2020-06-15 | Discharge: 2020-06-15 | Disposition: A | Discharge status: Home / Self Care | Payer: PPO | Source: Ambulatory Visit | Attending: Orthopedic Surgery | Admitting: Orthopedic Surgery

## 2020-06-15 DIAGNOSIS — Z01818 Encounter for other preprocedural examination: Secondary | ICD-10-CM | POA: Diagnosis not present

## 2020-06-15 DIAGNOSIS — I452 Bifascicular block: Secondary | ICD-10-CM | POA: Insufficient documentation

## 2020-06-15 DIAGNOSIS — Z01810 Encounter for preprocedural cardiovascular examination: Secondary | ICD-10-CM | POA: Diagnosis not present

## 2020-06-15 HISTORY — DX: Failed or difficult intubation, initial encounter: T88.4XXA

## 2020-06-15 LAB — COMPREHENSIVE METABOLIC PANEL
ALT: 79 U/L — ABNORMAL HIGH (ref 0–44)
AST: 47 U/L — ABNORMAL HIGH (ref 15–41)
Albumin: 4.2 g/dL (ref 3.5–5.0)
Alkaline Phosphatase: 76 U/L (ref 38–126)
Anion gap: 9 (ref 5–15)
BUN: 14 mg/dL (ref 8–23)
CO2: 26 mmol/L (ref 22–32)
Calcium: 9.7 mg/dL (ref 8.9–10.3)
Chloride: 97 mmol/L — ABNORMAL LOW (ref 98–111)
Creatinine, Ser: 0.77 mg/dL (ref 0.44–1.00)
GFR, Estimated: >60 mL/min (ref >60)
Glucose, Bld: 85 mg/dL (ref 70–99)
Potassium: 4.1 mmol/L (ref 3.5–5.1)
Sodium: 132 mmol/L — ABNORMAL LOW (ref 135–145)
Total Bilirubin: 0.7 mg/dL (ref 0.3–1.2)
Total Protein: 7.3 g/dL (ref 6.5–8.1)

## 2020-06-15 LAB — SEDIMENTATION RATE: Sed Rate: 29 mm/hr (ref 0–30)

## 2020-06-15 LAB — CBC WITH DIFFERENTIAL/PLATELET
Abs Immature Granulocytes: 0.05 10*3/uL (ref 0.00–0.07)
Basophils Absolute: 0.1 10*3/uL (ref 0.0–0.1)
Basophils Relative: 1 %
Eosinophils Absolute: 0.3 10*3/uL (ref 0.0–0.5)
Eosinophils Relative: 4 %
HCT: 39.9 % (ref 36.0–46.0)
Hemoglobin: 13.2 g/dL (ref 12.0–15.0)
Immature Granulocytes: 1 %
Lymphocytes Relative: 35 %
Lymphs Abs: 3.2 10*3/uL (ref 0.7–4.0)
MCH: 29.2 pg (ref 26.0–34.0)
MCHC: 33.1 g/dL (ref 30.0–36.0)
MCV: 88.3 fL (ref 80.0–100.0)
Monocytes Absolute: 0.7 10*3/uL (ref 0.1–1.0)
Monocytes Relative: 8 %
Neutro Abs: 4.7 10*3/uL (ref 1.7–7.7)
Neutrophils Relative %: 51 %
Platelets: 311 10*3/uL (ref 150–400)
RBC: 4.52 MIL/uL (ref 3.87–5.11)
RDW: 13.1 % (ref 11.5–15.5)
WBC: 9 10*3/uL (ref 4.0–10.5)
nRBC: 0 % (ref 0.0–0.2)

## 2020-06-15 LAB — URINALYSIS, ROUTINE W REFLEX MICROSCOPIC
Bilirubin Urine: NEGATIVE
Glucose, UA: NEGATIVE mg/dL
Hgb urine dipstick: NEGATIVE
Ketones, ur: NEGATIVE mg/dL
Leukocytes,Ua: NEGATIVE
Nitrite: NEGATIVE
Protein, ur: NEGATIVE mg/dL
Specific Gravity, Urine: 1.011 (ref 1.005–1.030)
pH: 5 (ref 5.0–8.0)

## 2020-06-15 LAB — SURGICAL PCR SCREEN
MRSA, PCR: NEGATIVE
Staphylococcus aureus: NEGATIVE

## 2020-06-15 LAB — APTT: aPTT: 34 seconds (ref 24–36)

## 2020-06-15 LAB — PROTIME-INR
INR: 1 (ref 0.8–1.2)
Prothrombin Time: 12.6 seconds (ref 11.4–15.2)

## 2020-06-15 LAB — C-REACTIVE PROTEIN: CRP: 0.8 mg/dL (ref <1.0)

## 2020-06-15 NOTE — Patient Instructions (Signed)
Your procedure is scheduled on:Wed. 11/10  Report to Registration Desk then to Day surgery . To find out your arrival time please call [phone] between 1PM - 3PM on .  Remember: Instructions that are not followed completely may result in serious medical risk,  up to and including death, or upon the discretion of your surgeon and anesthesiologist your  surgery may need to be rescheduled.     _X__ 1. Do not eat food after midnight the night before your procedure.                 No chewing gum or hard candies. You may drink clear liquids up to 2 hours                 before you are scheduled to arrive for your surgery- DO not drink clear                 liquids within 2 hours of the start of your surgery.                 Clear Liquids include:  water, apple juice without pulp, clear Gatorade, G2 or                  Gatorade Zero (avoid Red/Purple/Blue), Black Coffee or Tea (Do not add                 anything to coffee or tea). ___x__2.   Complete the "Ensure Clear Pre-surgery Clear Carbohydrate Drink" provided to you, 2 hours before arrival.  __X__2.  On the morning of surgery brush your teeth with toothpaste and water, you                may rinse your mouth with mouthwash if you wish.  Do not swallow any toothpaste of mouthwash.     _X__ 3.  No Alcohol for 24 hours before or after surgery.   ___ 4.  Do Not Smoke or use e-cigarettes For 24 Hours Prior to Your Surgery.                 Do not use any chewable tobacco products for at least 6 hours prior to                 Surgery.  ___  5.  Do not use any recreational drugs (marijuana, cocaine, heroin, ecstasy, MDMA or other)                For at least one week prior to your surgery.  Combination of these drugs with anesthesia                May have life threatening results.  ____  6.  Bring all medications with you on the day of surgery if instructed.   __x__  7.  Notify your doctor if there is any change  in your medical condition      (cold, fever, infections).     Do not wear jewelry, make-up, hairpins, clips or nail polish. Do not wear lotions, powders, or perfumes. You may wear deodorant. Do not shave 48 hours prior to surgery. Men may shave face and neck. Do not bring valuables to the hospital.    [facility] is not responsible for any belongings or valuables.  Contacts, dentures or bridgework may not be worn into surgery. Leave your suitcase in the car. After surgery it may be brought to your room. For patients admitted to the  hospital, discharge time is determined by your treatment team.   Patients discharged the day of surgery will not be allowed to drive home.   Make arrangements for someone to be with you for the first 24 hours of your Same Day Discharge.    Please read over the following fact sheets that you were given:   Incentive spirometer    __x__ Take these medicines the morning of surgery with A SIP OF WATER:    1. acetaminophen (TYLENOL) 500 MG tablet if needed  2. atorvastatin (LIPITOR) 80 MG tablet  3. cetirizine (ZYRTEC) 10 MG tablet  4.  5.  6.  ____ Fleet Enema (as directed)   __x__ Use CHG Soap (or wipes) as directed  ____ Use Benzoyl Peroxide Gel as instructed  ____ Use inhalers on the day of surgery  ____ Stop metformin 2 days prior to surgery    ____ Take 1/2 of usual insulin dose the night before surgery. No insulin the morning          of surgery.   ____ Stop Coumadin/Plavix/aspirin   __x__ Stop Anti-inflammatories ibuprofen (ADVIL,MOTRIN) 200 MG tablet,naproxen (NAPROSYN) 500 MG tablet or aspirin products.  May take tylenol   ____ Stop supplements until after surgery.    ____ Bring C-Pap to the hospital.    If you have any questions regarding your pre-procedure instructions,  Please call Pre-admit Testing at [phone]

## 2020-06-16 LAB — URINE CULTURE
Culture: <10000 — AB
Special Requests: NORMAL

## 2020-06-18 ENCOUNTER — Encounter: Payer: Self-pay | Admitting: Orthopedic Surgery

## 2020-06-18 DIAGNOSIS — N3946 Mixed incontinence: Secondary | ICD-10-CM | POA: Insufficient documentation

## 2020-06-18 DIAGNOSIS — R739 Hyperglycemia, unspecified: Secondary | ICD-10-CM | POA: Insufficient documentation

## 2020-06-18 DIAGNOSIS — J449 Chronic obstructive pulmonary disease, unspecified: Secondary | ICD-10-CM | POA: Insufficient documentation

## 2020-06-18 DIAGNOSIS — E785 Hyperlipidemia, unspecified: Secondary | ICD-10-CM | POA: Insufficient documentation

## 2020-06-18 HISTORY — DX: Hyperglycemia, unspecified: R73.9

## 2020-06-18 NOTE — H&P (Signed)
ORTHOPAEDIC HISTORY & PHYSICAL  Progress Notes Vivien Barretto, Florinda Marker., MD - 06/13/2020 11:00 AM EDT Chief Complaint: Chief Complaint  Patient presents with  . Hip Pain  H&P - Left total hip arthroplasty scheduled 06/21/20   Reason for Visit: The patient is a 74 y.o. female who presents today with her husband for reevaluation of her left hip. She has a 2 year(s) history of left hip and groin pain that has significantly increased over the last several months. The pain is worse with weight bearing. The left hip pain limits the patient's ability to walk long distances. The patient has not appreciated any significant improvement despite Tylenol, NSAIDs, ambulatory aids, weight loss, activity modification, and intraarticular corticosteroid injection to the left hip. She did notice transient improvement with the lidocaine. She is using ambulatory aids. She feels as though the left hip will collapse. The patient states that the hip pain has progressed to the point that it is significantly interfering with her activities of daily living.  Of note, she recently sustained a fall that required some sutures to her face. She was evaluated in the Emergency Department. There was an incidental finding of pulmonary nodules on radiographs and the patient had a chest CT which demonstrated multiple small bilateral pulmonary nodules consistent with granulomas.  Medications: Current Outpatient Medications  Medication Sig Dispense Refill  . atorvastatin (LIPITOR) 80 MG tablet Take 1 tablet (80 mg total) by mouth once daily 90 tablet 4  . azelastine (ASTELIN) 137 mcg nasal spray Place 1 spray into both nostrils 2 (two) times daily as needed for Rhinitis 30 mL 12  . cetirizine (ZYRTEC) 10 MG tablet Take 10 mg by mouth once daily.  . diphenhydrAMINE-acetaminophen (TYLENOL PM EXTRA STRENGTH) 25-500 mg per tablet Take 1 tablet by mouth nightly  . hydroCHLOROthiazide (HYDRODIURIL) 25 MG tablet Take 1 tablet (25 mg total)  by mouth once daily 90 tablet 4  . multivit with calcium,iron,min (MULTIVITAMIN-CALCIUM AND IRON ORAL) Take 1 caplet by mouth once daily  . naproxen (NAPROSYN) 500 MG tablet Take 1 tablet (500 mg total) by mouth 2 (two) times daily with meals 60 tablet 3  . sulfamethoxazole-trimethoprim (BACTRIM DS) 800-160 mg tablet Take 1 tablet by mouth 2 (two) times daily   No current facility-administered medications for this visit.   Allergies: No Known Allergies  Past Medical History: Past Medical History:  Diagnosis Date  . Arthritis  prior left arm fracture  . Atypical chest pain April 2007  Myoview-negative for Ischemia with preserved LV function  . Cataract cortical, senile 2016  Surgery 2018 both eyes  . COPD (chronic obstructive pulmonary disease) (CMS-HCC) 2002  . COPD with asthma , unspecified (CMS-HCC)  . History of colon polyps  . History of skin cancer  Followed by Dr Sarina Ser  . Hypercalcemia February 2009  PTH in high normal range  . Hyperglycemia  . Hyperlipidemia  . Obesity  . Sleep apnea Dec 2019  . 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 Urinary incontinence, mixed   Past Surgical History: Past Surgical History:  Procedure Laterality Date  . Extensive arthroscopic debridement with abrasion chondroplasty, excision of loose body, and partial lateral meniscectomy, left knee. Left 01/23/2016  Dr. Roland Rack  . ANTERIOR FUSION CERVICAL SPINE 2016  Dr. Hal Neer  . CATARACT EXTRACTION 2018  Both eyes  . COLONOSCOPY 07/03/2004  adenomatous polyp, FH colon cancer 1st degree relative  . COLONOSCOPY 04/14/2007  normal, PH adenomatous polyp  . COLONOSCOPY 06/09/2012  hyperplastic polyp, repeat 5 years ([place])  . COLONOSCOPY 09/23/2017  Negative colon biopsy/FHx CC- Mother/PHx CP/Repeat 61yrs/TKT  . DISCECTOMY ANTERIOR CERVICLE W/DECOMP 01/2015  . FRACTURE SURGERY 2017, 2004  . HYSTERECTOMY  partial hysterectomy  . KNEE ARTHROSCOPY  . ORIF of displaces greater  tuberosity fracture, right proximal humerus Right 08/31/2015  Dr.Poggi  . OTHER SURGERY Left 2004  arm, left elbow fracture  . Partial removal of right thumb 2005   Social History: Social History   Socioeconomic History  . Marital status: Married  Spouse name: ELODIA HAVILAND  . Number of children: 0  . Years of education: 44  . Highest education level: Not on file  Occupational History  . Occupation: Retired  Tobacco Use  . Smoking status: Former Smoker  Packs/day: 1.00  Years: 15.00  Pack years: 15.00  Types: Cigarettes  Start date: 08/13/1971  Quit date: 08/12/1986  Years since quitting: 33.8  . Smokeless tobacco: Never Used  . Tobacco comment: stopped age 30  Vaping Use  . Vaping Use: Never used  Substance and Sexual Activity  . Alcohol use: Yes  Alcohol/week: 8.0 standard drinks  Types: 8 Glasses of wine per week  Comment: 2 glasses 4 x week  . Drug use: No  . Sexual activity: Defer  Partners: Male  Birth control/protection: Post-menopausal  Other Topics Concern  . Not on file  Social History Narrative  . Not on file   Social Determinants of Health   Financial Resource Strain: Not on file  Food Insecurity: Not on file  Transportation Needs: Not on file  Physical Activity: Not on file  Stress: Not on file  Social Connections: Not on file  Housing Stability: Not on file   Family History: Family History  Problem Relation Age of Onset  . Colon cancer Mother 59  . Lung cancer Mother  . High blood pressure (Hypertension) Mother  . Osteoporosis (Thinning of bones) Mother  . Heart disease Brother  . High blood pressure (Hypertension) Brother  . Stroke Father  . High blood pressure (Hypertension) Father  . Breast cancer Sister 19  . Lung cancer Sister  . Breast cancer Sister  . Hyperlipidemia (Elevated cholesterol) Sister  . High blood pressure (Hypertension) Sister  . Osteoarthritis Sister  . Coronary Artery Disease (Blocked arteries around heart)  Brother  . Hyperlipidemia (Elevated cholesterol) Brother  . High blood pressure (Hypertension) Brother  . Diabetes type II Brother  . Hyperlipidemia (Elevated cholesterol) Brother  . Osteoarthritis Sister  . Osteoporosis (Thinning of bones) Sister  . Osteoarthritis Sister   Review of Systems: A comprehensive 14 point ROS was performed, reviewed, and the pertinent orthopaedic findings are documented in the HPI.  Exam BP 146/78  Temp 36.6 C (97.9 F)  Ht 167.6 cm (5\' 6" )  Wt (!) 111.5 kg (245 lb 12.8 oz)  BMI 39.67 kg/m   General:  Well-developed, well-nourished female seen in no acute distress. Antalgic gait without evidence of significant abductor lurch.  HEENT:  Normocephalic. Resolving periorbital bruising. Pupils are equal and reactive to light. Extraocular motion is intact. Sclera are clear. Oropharynx is clear with moist mucosa.  Neck:  Supple, nontender, and with good ROM. No thyromegaly, adenopathy, JVD, or carotid bruits.  Lungs:  Clear to auscultation bilaterally.  Cardiovascular:  Regular rate and rhythm. Normal S1, S2. No murmur . No appreciable gallops or rubs. Peripheral pulses are palpable. No lower extremity edema. Homan`s test is negative.  Abdomen:  Soft, nontender, nondistended. Bowel sounds are  present.  Spine: Alignment: No gross scoliosis. Normal lumbar lordosis. Sacroiliac joints: Nontender to palpation Patrick`s test: Negative Flip test: Negative Tenderness: None Paraspinous spasm: None Range of motion: Fair range of motion with both flexion and extension  Extremities: Good strength, stability, and range of motion of the upper extremities. Good range of motion of the knees and ankles.  Left Hip: Pelvic tilt: Negative Limb lengths: Equal with the patient standing Soft tissue swelling: Negative Erythema: Negative Crepitance: Negative Tenderness: Greater trochanter is nontender to palpation. Severe pain is elicited by axial compression  or extremes of rotation. Atrophy: No atrophy. Fair to good hip flexor and abductor strength. Range of Motion: EXT/FLEX: 0/0/100 ADD/ABD: 20/0/10 IR/ER: 10/0/20  Vascular: Peripheral pulses are palpable. Good capillary refill. No gross pretibial or ankle edema. Homans test is negative.  Neurologic:  Awake, alert, and oriented.  Sensory function is intact to pinprick and light touch.  Motor strength is judged to be 5/5.  Motor coordination is grossly within normal limits.  No apparent clonus. No tremor.  Deep tendon reflexes are symmetric.  X-rays: I reviewed the left hip radiographs that were performed at [facility] on 03/28/2020. There is significant narrowing of the cartilage space superiorly. No significant osteophyte formation is appreciated. No evidence of fracture or dislocation.  MRI of the left hip: I reviewed the left hip MRI performed at [facility] on 03/22/2020. I concur with the radiologist's interpretation as below:  MR OF THE LEFT HIP WITHOUT CONTRAST   TECHNIQUE:  Multiplanar, multisequence MR imaging was performed. No intravenous  contrast was administered.   COMPARISON: None.   FINDINGS:  Bones: Small focus of serpiginous signal abnormality in the left  superolateral femoral head consistent with avascular necrosis.  Surrounding reactive marrow edema. There is no evidence of acute  fracture or dislocation. No focal bone lesion. The visualized  sacroiliac joints and symphysis pubis appear normal.   Articular cartilage and labrum   Articular cartilage: Extensive full-thickness cartilage loss in the  anterior superior left hip joint. The right hip joint cartilage  appears preserved.   Labrum: Torn left anterior superior labrum.   Joint or bursal effusion   Joint effusion: Small left joint effusion with synovitis. No right  hip joint effusion.   Bursae: No focal periarticular fluid collection.   Muscles and tendons   Muscles  and tendons: The visualized gluteus, hamstring and iliopsoas  tendons appear normal. No muscle edema or atrophy.   Other findings   Miscellaneous: Prior hysterectomy. Sigmoid diverticulosis.   IMPRESSION:  1. Avascular necrosis of the left femoral head.  2. Moderate left hip osteoarthritis.  3. Torn left anterior superior labrum.   Electronically Signed  By: Titus Dubin M.D.  On: 03/22/2020 19:22  Impression: Degenerative arthrosis of the left hip Avascular necrosis of the left femoral head  Plan:  The findings were discussed in detail with the patient.  Conservative treatment options were reviewed with the patient. We discussed the risks and benefits of surgical intervention. The usual perioperative course was also discussed in detail. The patient expressed understanding of the risks and benefits of surgical intervention and would like to proceed with plans for left total hip arthroplasty.  MEDICAL CLEARANCE: Per anesthesiology ACTIVITIES:  As tolerated. WORK STATUS: Not applicable. THERAPY: Preoperative physical therapy evaluation. MEDICATIONS: Requested Prescriptions   No prescriptions requested or ordered in this encounter   FOLLOW-UP: Return for postoperative follow-up.  Teylor Wolven P. Holley Bouche., M.D.  This note was generated in  part with voice recognition software and I apologize for any typographical errors that were not detected and corrected.

## 2020-06-19 ENCOUNTER — Other Ambulatory Visit: Payer: Self-pay

## 2020-06-19 ENCOUNTER — Other Ambulatory Visit
Admission: RE | Admit: 2020-06-19 | Discharge: 2020-06-19 | Disposition: A | Discharge status: Home / Self Care | Payer: PPO | Source: Ambulatory Visit | Attending: Orthopedic Surgery | Admitting: Orthopedic Surgery

## 2020-06-19 DIAGNOSIS — Z01818 Encounter for other preprocedural examination: Secondary | ICD-10-CM | POA: Insufficient documentation

## 2020-06-19 DIAGNOSIS — Z85828 Personal history of other malignant neoplasm of skin: Secondary | ICD-10-CM | POA: Diagnosis not present

## 2020-06-19 DIAGNOSIS — M25552 Pain in left hip: Secondary | ICD-10-CM | POA: Diagnosis present

## 2020-06-19 DIAGNOSIS — E785 Hyperlipidemia, unspecified: Secondary | ICD-10-CM | POA: Diagnosis present

## 2020-06-19 DIAGNOSIS — Z86010 Personal history of colonic polyps: Secondary | ICD-10-CM | POA: Diagnosis not present

## 2020-06-19 DIAGNOSIS — M19041 Primary osteoarthritis, right hand: Secondary | ICD-10-CM | POA: Diagnosis present

## 2020-06-19 DIAGNOSIS — Z79899 Other long term (current) drug therapy: Secondary | ICD-10-CM | POA: Diagnosis not present

## 2020-06-19 DIAGNOSIS — Z20822 Contact with and (suspected) exposure to covid-19: Secondary | ICD-10-CM | POA: Diagnosis present

## 2020-06-19 DIAGNOSIS — I10 Essential (primary) hypertension: Secondary | ICD-10-CM | POA: Diagnosis not present

## 2020-06-19 DIAGNOSIS — M1612 Unilateral primary osteoarthritis, left hip: Secondary | ICD-10-CM | POA: Diagnosis present

## 2020-06-19 DIAGNOSIS — Z471 Aftercare following joint replacement surgery: Secondary | ICD-10-CM | POA: Diagnosis not present

## 2020-06-19 DIAGNOSIS — M19042 Primary osteoarthritis, left hand: Secondary | ICD-10-CM | POA: Diagnosis present

## 2020-06-19 DIAGNOSIS — Z8262 Family history of osteoporosis: Secondary | ICD-10-CM | POA: Diagnosis not present

## 2020-06-19 DIAGNOSIS — M879 Osteonecrosis, unspecified: Secondary | ICD-10-CM | POA: Diagnosis present

## 2020-06-19 DIAGNOSIS — J449 Chronic obstructive pulmonary disease, unspecified: Secondary | ICD-10-CM | POA: Diagnosis present

## 2020-06-19 DIAGNOSIS — Z90711 Acquired absence of uterus with remaining cervical stump: Secondary | ICD-10-CM | POA: Diagnosis not present

## 2020-06-19 DIAGNOSIS — Z96642 Presence of left artificial hip joint: Secondary | ICD-10-CM | POA: Diagnosis not present

## 2020-06-19 DIAGNOSIS — M1909 Primary osteoarthritis, other specified site: Secondary | ICD-10-CM | POA: Diagnosis present

## 2020-06-19 DIAGNOSIS — Z8781 Personal history of (healed) traumatic fracture: Secondary | ICD-10-CM | POA: Diagnosis not present

## 2020-06-19 DIAGNOSIS — M47812 Spondylosis without myelopathy or radiculopathy, cervical region: Secondary | ICD-10-CM | POA: Diagnosis present

## 2020-06-19 DIAGNOSIS — Z6841 Body Mass Index (BMI) 40.0 and over, adult: Secondary | ICD-10-CM | POA: Diagnosis not present

## 2020-06-19 DIAGNOSIS — N3946 Mixed incontinence: Secondary | ICD-10-CM | POA: Diagnosis present

## 2020-06-19 DIAGNOSIS — Z87891 Personal history of nicotine dependence: Secondary | ICD-10-CM | POA: Diagnosis not present

## 2020-06-19 DIAGNOSIS — Z791 Long term (current) use of non-steroidal anti-inflammatories (NSAID): Secondary | ICD-10-CM | POA: Diagnosis not present

## 2020-06-19 DIAGNOSIS — G4733 Obstructive sleep apnea (adult) (pediatric): Secondary | ICD-10-CM | POA: Diagnosis present

## 2020-06-19 DIAGNOSIS — Z9181 History of falling: Secondary | ICD-10-CM | POA: Diagnosis not present

## 2020-06-20 ENCOUNTER — Encounter: Payer: Self-pay | Admitting: Orthopedic Surgery

## 2020-06-20 LAB — TYPE AND SCREEN
ABO/RH(D): A POS
Antibody Screen: NEGATIVE

## 2020-06-20 LAB — SARS CORONAVIRUS 2 (TAT 6-24 HRS): SARS Coronavirus 2: NEGATIVE

## 2020-06-21 ENCOUNTER — Other Ambulatory Visit: Payer: Self-pay

## 2020-06-21 ENCOUNTER — Inpatient Hospital Stay: Payer: PPO

## 2020-06-21 ENCOUNTER — Encounter: Payer: Self-pay | Admitting: Orthopedic Surgery

## 2020-06-21 ENCOUNTER — Inpatient Hospital Stay: Payer: PPO | Admitting: Urgent Care

## 2020-06-21 ENCOUNTER — Inpatient Hospital Stay
Admission: RE | Admit: 2020-06-21 | Discharge: 2020-06-23 | DRG: 470 | Disposition: A | Discharge status: Home Health | Payer: PPO | Attending: Orthopedic Surgery | Admitting: Orthopedic Surgery

## 2020-06-21 ENCOUNTER — Encounter
Admission: RE | Disposition: A | Discharge status: Home Health | Payer: Self-pay | Source: Home / Self Care | Attending: Orthopedic Surgery

## 2020-06-21 DIAGNOSIS — J449 Chronic obstructive pulmonary disease, unspecified: Secondary | ICD-10-CM | POA: Diagnosis present

## 2020-06-21 DIAGNOSIS — Z87891 Personal history of nicotine dependence: Secondary | ICD-10-CM

## 2020-06-21 DIAGNOSIS — M19042 Primary osteoarthritis, left hand: Secondary | ICD-10-CM | POA: Diagnosis present

## 2020-06-21 DIAGNOSIS — Z8262 Family history of osteoporosis: Secondary | ICD-10-CM

## 2020-06-21 DIAGNOSIS — Z20822 Contact with and (suspected) exposure to covid-19: Secondary | ICD-10-CM | POA: Diagnosis present

## 2020-06-21 DIAGNOSIS — Z85828 Personal history of other malignant neoplasm of skin: Secondary | ICD-10-CM | POA: Diagnosis not present

## 2020-06-21 DIAGNOSIS — Z79899 Other long term (current) drug therapy: Secondary | ICD-10-CM

## 2020-06-21 DIAGNOSIS — Z8781 Personal history of (healed) traumatic fracture: Secondary | ICD-10-CM

## 2020-06-21 DIAGNOSIS — Z96649 Presence of unspecified artificial hip joint: Secondary | ICD-10-CM

## 2020-06-21 DIAGNOSIS — M19041 Primary osteoarthritis, right hand: Secondary | ICD-10-CM | POA: Diagnosis present

## 2020-06-21 DIAGNOSIS — Z90711 Acquired absence of uterus with remaining cervical stump: Secondary | ICD-10-CM | POA: Diagnosis not present

## 2020-06-21 DIAGNOSIS — M879 Osteonecrosis, unspecified: Secondary | ICD-10-CM | POA: Diagnosis present

## 2020-06-21 DIAGNOSIS — M1612 Unilateral primary osteoarthritis, left hip: Principal | ICD-10-CM | POA: Diagnosis present

## 2020-06-21 DIAGNOSIS — N3946 Mixed incontinence: Secondary | ICD-10-CM | POA: Diagnosis present

## 2020-06-21 DIAGNOSIS — E785 Hyperlipidemia, unspecified: Secondary | ICD-10-CM | POA: Diagnosis present

## 2020-06-21 DIAGNOSIS — Z6841 Body Mass Index (BMI) 40.0 and over, adult: Secondary | ICD-10-CM | POA: Diagnosis not present

## 2020-06-21 DIAGNOSIS — M47812 Spondylosis without myelopathy or radiculopathy, cervical region: Secondary | ICD-10-CM | POA: Diagnosis present

## 2020-06-21 DIAGNOSIS — Z791 Long term (current) use of non-steroidal anti-inflammatories (NSAID): Secondary | ICD-10-CM | POA: Diagnosis not present

## 2020-06-21 DIAGNOSIS — M1909 Primary osteoarthritis, other specified site: Secondary | ICD-10-CM | POA: Diagnosis present

## 2020-06-21 DIAGNOSIS — Z9181 History of falling: Secondary | ICD-10-CM | POA: Diagnosis not present

## 2020-06-21 DIAGNOSIS — Z96642 Presence of left artificial hip joint: Secondary | ICD-10-CM

## 2020-06-21 DIAGNOSIS — Z86010 Personal history of colonic polyps: Secondary | ICD-10-CM

## 2020-06-21 DIAGNOSIS — M25552 Pain in left hip: Secondary | ICD-10-CM | POA: Diagnosis present

## 2020-06-21 DIAGNOSIS — G4733 Obstructive sleep apnea (adult) (pediatric): Secondary | ICD-10-CM | POA: Diagnosis present

## 2020-06-21 HISTORY — PX: TOTAL HIP ARTHROPLASTY: SHX124

## 2020-06-21 SURGERY — ARTHROPLASTY, HIP, TOTAL,POSTERIOR APPROACH
Anesthesia: Spinal | Site: Hip | Laterality: Left

## 2020-06-21 MED ORDER — CHLORHEXIDINE GLUCONATE 4 % EX LIQD: 60.0000 mL | Freq: Once | CUTANEOUS | Status: DC

## 2020-06-21 MED ORDER — PROPOFOL 10 MG/ML IV BOLUS
INTRAVENOUS | Status: AC
  Filled 2020-06-21: qty 20

## 2020-06-21 MED ORDER — PROPOFOL 500 MG/50ML IV EMUL
INTRAVENOUS | Status: AC
  Filled 2020-06-21: qty 50

## 2020-06-21 MED ORDER — CHLORHEXIDINE GLUCONATE 0.12 % MT SOLN
OROMUCOSAL | Status: AC
  Administered 2020-06-21: 15 mL via OROMUCOSAL
  Filled 2020-06-21: qty 15

## 2020-06-21 MED ORDER — PROPOFOL 500 MG/50ML IV EMUL
INTRAVENOUS | Status: DC | PRN
  Administered 2020-06-21: 100 ug/kg/min via INTRAVENOUS

## 2020-06-21 MED ORDER — CHLORHEXIDINE GLUCONATE 0.12 % MT SOLN: 15.0000 mL | Freq: Once | OROMUCOSAL | Status: AC

## 2020-06-21 MED ORDER — ENSURE PRE-SURGERY PO LIQD
296.0000 mL | Freq: Once | ORAL | Status: DC
  Filled 2020-06-21: qty 296

## 2020-06-21 MED ORDER — ONDANSETRON HCL 4 MG PO TABS: 4.0000 mg | ORAL_TABLET | Freq: Four times a day (QID) | ORAL | Status: DC | PRN

## 2020-06-21 MED ORDER — DIPHENHYDRAMINE HCL 12.5 MG/5ML PO ELIX: 12.5000 mg | ORAL_SOLUTION | ORAL | Status: DC | PRN

## 2020-06-21 MED ORDER — SODIUM CHLORIDE 0.9 % IV SOLN
INTRAVENOUS | Status: DC | PRN
  Administered 2020-06-21: 30 ug/min via INTRAVENOUS

## 2020-06-21 MED ORDER — FENTANYL CITRATE (PF) 100 MCG/2ML IJ SOLN
25.0000 ug | INTRAMUSCULAR | Status: DC | PRN
  Administered 2020-06-21 (×2): 25 ug via INTRAVENOUS

## 2020-06-21 MED ORDER — FLEET ENEMA 7-19 GM/118ML RE ENEM: 1.0000 | ENEMA | Freq: Once | RECTAL | Status: DC | PRN

## 2020-06-21 MED ORDER — ENOXAPARIN SODIUM 30 MG/0.3ML ~~LOC~~ SOLN
30.0000 mg | Freq: Two times a day (BID) | SUBCUTANEOUS | Status: DC
  Administered 2020-06-22 – 2020-06-23 (×3): 30 mg via SUBCUTANEOUS
  Filled 2020-06-21 (×3): qty 0.3

## 2020-06-21 MED ORDER — ONDANSETRON HCL 4 MG/2ML IJ SOLN: 4.0000 mg | Freq: Once | INTRAMUSCULAR | Status: DC | PRN

## 2020-06-21 MED ORDER — LORATADINE 10 MG PO TABS
10.0000 mg | ORAL_TABLET | Freq: Every day | ORAL | Status: DC
  Administered 2020-06-22 – 2020-06-23 (×2): 10 mg via ORAL
  Filled 2020-06-21 (×2): qty 1

## 2020-06-21 MED ORDER — ALUM & MAG HYDROXIDE-SIMETH 200-200-20 MG/5ML PO SUSP: 30.0000 mL | ORAL | Status: DC | PRN

## 2020-06-21 MED ORDER — PHENYLEPHRINE HCL (PRESSORS) 10 MG/ML IV SOLN
INTRAVENOUS | Status: DC | PRN
  Administered 2020-06-21 (×4): 50 ug via INTRAVENOUS

## 2020-06-21 MED ORDER — LACTATED RINGERS IV SOLN: INTRAVENOUS | Status: DC

## 2020-06-21 MED ORDER — FERROUS SULFATE 325 (65 FE) MG PO TABS
325.0000 mg | ORAL_TABLET | Freq: Two times a day (BID) | ORAL | Status: DC
  Administered 2020-06-21 – 2020-06-23 (×4): 325 mg via ORAL
  Filled 2020-06-21 (×4): qty 1

## 2020-06-21 MED ORDER — BUPIVACAINE HCL (PF) 0.5 % IJ SOLN
INTRAMUSCULAR | Status: DC | PRN
  Administered 2020-06-21: 3 mL

## 2020-06-21 MED ORDER — MENTHOL 3 MG MT LOZG
1.0000 | LOZENGE | OROMUCOSAL | Status: DC | PRN
  Filled 2020-06-21: qty 9

## 2020-06-21 MED ORDER — HYDROMORPHONE HCL 1 MG/ML IJ SOLN: 0.5000 mg | INTRAMUSCULAR | Status: DC | PRN

## 2020-06-21 MED ORDER — PROPOFOL 10 MG/ML IV BOLUS: INTRAVENOUS | Status: DC | PRN

## 2020-06-21 MED ORDER — FENTANYL CITRATE (PF) 100 MCG/2ML IJ SOLN
INTRAMUSCULAR | Status: AC
  Administered 2020-06-21: 25 ug via INTRAVENOUS
  Filled 2020-06-21: qty 2

## 2020-06-21 MED ORDER — CELECOXIB 200 MG PO CAPS
ORAL_CAPSULE | ORAL | Status: AC
  Administered 2020-06-21: 400 mg via ORAL
  Filled 2020-06-21: qty 2

## 2020-06-21 MED ORDER — GLYCOPYRROLATE 0.2 MG/ML IJ SOLN
INTRAMUSCULAR | Status: DC | PRN
  Administered 2020-06-21 (×2): .1 mg via INTRAVENOUS

## 2020-06-21 MED ORDER — METOCLOPRAMIDE HCL 10 MG PO TABS
10.0000 mg | ORAL_TABLET | Freq: Three times a day (TID) | ORAL | Status: DC
  Administered 2020-06-21 – 2020-06-23 (×5): 10 mg via ORAL
  Filled 2020-06-21 (×7): qty 1

## 2020-06-21 MED ORDER — DEXAMETHASONE SODIUM PHOSPHATE 10 MG/ML IJ SOLN
INTRAMUSCULAR | Status: AC
  Administered 2020-06-21: 8 mg via INTRAVENOUS
  Filled 2020-06-21: qty 1

## 2020-06-21 MED ORDER — PANTOPRAZOLE SODIUM 40 MG PO TBEC
40.0000 mg | DELAYED_RELEASE_TABLET | Freq: Two times a day (BID) | ORAL | Status: DC
  Administered 2020-06-21 – 2020-06-23 (×5): 40 mg via ORAL
  Filled 2020-06-21 (×5): qty 1

## 2020-06-21 MED ORDER — NEOMYCIN-POLYMYXIN B GU 40-200000 IR SOLN
Status: DC | PRN
  Administered 2020-06-21: 2 mL

## 2020-06-21 MED ORDER — GABAPENTIN 300 MG PO CAPS
300.0000 mg | ORAL_CAPSULE | Freq: Every day | ORAL | Status: DC
  Administered 2020-06-21 – 2020-06-22 (×2): 300 mg via ORAL
  Filled 2020-06-21 (×2): qty 1

## 2020-06-21 MED ORDER — EPHEDRINE 5 MG/ML INJ
INTRAVENOUS | Status: AC
  Filled 2020-06-21: qty 10

## 2020-06-21 MED ORDER — FENTANYL CITRATE (PF) 100 MCG/2ML IJ SOLN
INTRAMUSCULAR | Status: AC
  Filled 2020-06-21: qty 2

## 2020-06-21 MED ORDER — ADULT MULTIVITAMIN W/MINERALS CH
1.0000 | ORAL_TABLET | Freq: Every day | ORAL | Status: DC
  Administered 2020-06-21 – 2020-06-23 (×3): 1 via ORAL
  Filled 2020-06-21 (×3): qty 1

## 2020-06-21 MED ORDER — SODIUM CHLORIDE 0.9 % IV SOLN: INTRAVENOUS | Status: DC

## 2020-06-21 MED ORDER — PHENYLEPHRINE HCL (PRESSORS) 10 MG/ML IV SOLN
INTRAVENOUS | Status: AC
  Filled 2020-06-21: qty 1

## 2020-06-21 MED ORDER — TRANEXAMIC ACID-NACL 1000-0.7 MG/100ML-% IV SOLN
INTRAVENOUS | Status: AC
  Administered 2020-06-21: 1000 mg via INTRAVENOUS
  Filled 2020-06-21: qty 100

## 2020-06-21 MED ORDER — ORAL CARE MOUTH RINSE: 15.0000 mL | Freq: Once | OROMUCOSAL | Status: AC

## 2020-06-21 MED ORDER — TRAMADOL HCL 50 MG PO TABS
50.0000 mg | ORAL_TABLET | ORAL | Status: DC | PRN
  Administered 2020-06-21 (×2): 50 mg via ORAL
  Administered 2020-06-21: 100 mg via ORAL
  Administered 2020-06-22 – 2020-06-23 (×2): 50 mg via ORAL
  Filled 2020-06-21 (×2): qty 1
  Filled 2020-06-21: qty 2
  Filled 2020-06-21 (×2): qty 1

## 2020-06-21 MED ORDER — CEFAZOLIN SODIUM-DEXTROSE 2-4 GM/100ML-% IV SOLN
2.0000 g | Freq: Four times a day (QID) | INTRAVENOUS | Status: AC
  Administered 2020-06-21 (×2): 2 g via INTRAVENOUS
  Filled 2020-06-21 (×2): qty 100

## 2020-06-21 MED ORDER — MAGNESIUM HYDROXIDE 400 MG/5ML PO SUSP
30.0000 mL | Freq: Every day | ORAL | Status: DC
  Administered 2020-06-21 – 2020-06-23 (×3): 30 mL via ORAL
  Filled 2020-06-21 (×3): qty 30

## 2020-06-21 MED ORDER — CEFAZOLIN SODIUM-DEXTROSE 2-4 GM/100ML-% IV SOLN
INTRAVENOUS | Status: AC
  Filled 2020-06-21: qty 100

## 2020-06-21 MED ORDER — ATORVASTATIN CALCIUM 20 MG PO TABS
80.0000 mg | ORAL_TABLET | Freq: Every day | ORAL | Status: DC
  Administered 2020-06-22 – 2020-06-23 (×2): 80 mg via ORAL
  Filled 2020-06-21 (×2): qty 4

## 2020-06-21 MED ORDER — TRANEXAMIC ACID-NACL 1000-0.7 MG/100ML-% IV SOLN
1000.0000 mg | INTRAVENOUS | Status: AC
  Administered 2020-06-21: 1000 mg via INTRAVENOUS

## 2020-06-21 MED ORDER — CELECOXIB 200 MG PO CAPS: 400.0000 mg | ORAL_CAPSULE | Freq: Once | ORAL | Status: AC

## 2020-06-21 MED ORDER — DEXAMETHASONE SODIUM PHOSPHATE 10 MG/ML IJ SOLN: 8.0000 mg | Freq: Once | INTRAMUSCULAR | Status: AC

## 2020-06-21 MED ORDER — HYDROCHLOROTHIAZIDE 25 MG PO TABS
25.0000 mg | ORAL_TABLET | Freq: Every day | ORAL | Status: DC
  Administered 2020-06-21 – 2020-06-23 (×3): 25 mg via ORAL
  Filled 2020-06-21 (×3): qty 1

## 2020-06-21 MED ORDER — MIDAZOLAM HCL 2 MG/2ML IJ SOLN
INTRAMUSCULAR | Status: AC
  Filled 2020-06-21: qty 2

## 2020-06-21 MED ORDER — CEFAZOLIN SODIUM-DEXTROSE 2-4 GM/100ML-% IV SOLN
2.0000 g | INTRAVENOUS | Status: AC
  Administered 2020-06-21: 2 g via INTRAVENOUS

## 2020-06-21 MED ORDER — TRANEXAMIC ACID-NACL 1000-0.7 MG/100ML-% IV SOLN
INTRAVENOUS | Status: AC
  Filled 2020-06-21: qty 100

## 2020-06-21 MED ORDER — TRANEXAMIC ACID-NACL 1000-0.7 MG/100ML-% IV SOLN: 1000.0000 mg | Freq: Once | INTRAVENOUS | Status: AC

## 2020-06-21 MED ORDER — ACETAMINOPHEN 10 MG/ML IV SOLN
1000.0000 mg | Freq: Four times a day (QID) | INTRAVENOUS | Status: AC
  Administered 2020-06-21 – 2020-06-22 (×4): 1000 mg via INTRAVENOUS
  Filled 2020-06-21 (×4): qty 100

## 2020-06-21 MED ORDER — BISACODYL 10 MG RE SUPP
10.0000 mg | Freq: Every day | RECTAL | Status: DC | PRN
  Administered 2020-06-23: 10 mg via RECTAL
  Filled 2020-06-21: qty 1

## 2020-06-21 MED ORDER — GLYCOPYRROLATE 0.2 MG/ML IJ SOLN
INTRAMUSCULAR | Status: AC
  Filled 2020-06-21: qty 1

## 2020-06-21 MED ORDER — SENNOSIDES-DOCUSATE SODIUM 8.6-50 MG PO TABS
1.0000 | ORAL_TABLET | Freq: Two times a day (BID) | ORAL | Status: DC
  Administered 2020-06-21 – 2020-06-23 (×5): 1 via ORAL
  Filled 2020-06-21 (×5): qty 1

## 2020-06-21 MED ORDER — BUPIVACAINE HCL (PF) 0.5 % IJ SOLN
INTRAMUSCULAR | Status: AC
  Filled 2020-06-21: qty 20

## 2020-06-21 MED ORDER — ONDANSETRON HCL 4 MG/2ML IJ SOLN
INTRAMUSCULAR | Status: DC | PRN
  Administered 2020-06-21: 4 mg via INTRAVENOUS

## 2020-06-21 MED ORDER — DEXMEDETOMIDINE (PRECEDEX) IN NS 20 MCG/5ML (4 MCG/ML) IV SYRINGE
PREFILLED_SYRINGE | INTRAVENOUS | Status: DC | PRN
  Administered 2020-06-21 (×10): 2 ug via INTRAVENOUS

## 2020-06-21 MED ORDER — FAMOTIDINE 20 MG PO TABS
ORAL_TABLET | ORAL | Status: AC
  Administered 2020-06-21: 20 mg via ORAL
  Filled 2020-06-21: qty 1

## 2020-06-21 MED ORDER — PROPOFOL 10 MG/ML IV BOLUS
INTRAVENOUS | Status: DC | PRN
  Administered 2020-06-21: 10 mg via INTRAVENOUS

## 2020-06-21 MED ORDER — FENTANYL CITRATE (PF) 100 MCG/2ML IJ SOLN
INTRAMUSCULAR | Status: DC | PRN
Start: 2020-06-21 — End: 2020-06-21
  Administered 2020-06-21 (×2): 12.5 ug via INTRAVENOUS

## 2020-06-21 MED ORDER — FAMOTIDINE 20 MG PO TABS: 20.0000 mg | ORAL_TABLET | Freq: Once | ORAL | Status: AC

## 2020-06-21 MED ORDER — GABAPENTIN 300 MG PO CAPS: 300.0000 mg | ORAL_CAPSULE | Freq: Once | ORAL | Status: AC

## 2020-06-21 MED ORDER — EPHEDRINE SULFATE 50 MG/ML IJ SOLN
INTRAMUSCULAR | Status: DC | PRN
  Administered 2020-06-21 (×4): 2.5 mg via INTRAVENOUS
  Administered 2020-06-21: 5 mg via INTRAVENOUS

## 2020-06-21 MED ORDER — ONDANSETRON HCL 4 MG/2ML IJ SOLN: 4.0000 mg | Freq: Four times a day (QID) | INTRAMUSCULAR | Status: DC | PRN

## 2020-06-21 MED ORDER — CELECOXIB 200 MG PO CAPS
200.0000 mg | ORAL_CAPSULE | Freq: Two times a day (BID) | ORAL | Status: DC
  Administered 2020-06-21 – 2020-06-23 (×5): 200 mg via ORAL
  Filled 2020-06-21 (×5): qty 1

## 2020-06-21 MED ORDER — MIDAZOLAM HCL 5 MG/5ML IJ SOLN
INTRAMUSCULAR | Status: DC | PRN
  Administered 2020-06-21 (×2): 1 mg via INTRAVENOUS

## 2020-06-21 MED ORDER — PHENOL 1.4 % MT LIQD
1.0000 | OROMUCOSAL | Status: DC | PRN
  Filled 2020-06-21: qty 177

## 2020-06-21 MED ORDER — DEXMEDETOMIDINE (PRECEDEX) IN NS 20 MCG/5ML (4 MCG/ML) IV SYRINGE
PREFILLED_SYRINGE | INTRAVENOUS | Status: AC
  Filled 2020-06-21: qty 5

## 2020-06-21 MED ORDER — ACETAMINOPHEN 325 MG PO TABS: 325.0000 mg | ORAL_TABLET | Freq: Four times a day (QID) | ORAL | Status: DC | PRN

## 2020-06-21 MED ORDER — OXYCODONE HCL 5 MG PO TABS
5.0000 mg | ORAL_TABLET | ORAL | Status: DC | PRN
  Administered 2020-06-22: 10 mg via ORAL
  Filled 2020-06-21: qty 2

## 2020-06-21 MED ORDER — GABAPENTIN 300 MG PO CAPS
ORAL_CAPSULE | ORAL | Status: AC
  Administered 2020-06-21: 300 mg via ORAL
  Filled 2020-06-21: qty 1

## 2020-06-21 SURGICAL SUPPLY — 63 items
BLADE DRUM FLTD (BLADE) ×2 IMPLANT
BLADE SAW 90X25X1.19 OSCILLAT (BLADE) ×2 IMPLANT
CANISTER SUCT 1200ML W/VALVE (MISCELLANEOUS) ×2 IMPLANT
CANISTER SUCT 3000ML PPV (MISCELLANEOUS) ×4 IMPLANT
CARTRIDGE OIL MAESTRO DRILL (MISCELLANEOUS) ×1 IMPLANT
COVER WAND RF STERILE (DRAPES) ×2 IMPLANT
CUP ACETBLR 52 OD 100 SERIES (Hips) ×1 IMPLANT
DIFFUSER DRILL AIR PNEUMATIC (MISCELLANEOUS) ×2 IMPLANT
DRAPE 3/4 80X56 (DRAPES) ×2 IMPLANT
DRAPE INCISE IOBAN 66X60 STRL (DRAPES) ×2 IMPLANT
DRSG DERMACEA 8X12 NADH (GAUZE/BANDAGES/DRESSINGS) ×2 IMPLANT
DRSG MEPILEX SACRM 8.7X9.8 (GAUZE/BANDAGES/DRESSINGS) ×2 IMPLANT
DRSG OPSITE POSTOP 4X12 (GAUZE/BANDAGES/DRESSINGS) ×2 IMPLANT
DRSG OPSITE POSTOP 4X14 (GAUZE/BANDAGES/DRESSINGS) IMPLANT
DRSG TEGADERM 4X4.75 (GAUZE/BANDAGES/DRESSINGS) ×2 IMPLANT
DURAPREP 26ML APPLICATOR (WOUND CARE) ×2 IMPLANT
ELECT REM PT RETURN 9FT ADLT (ELECTROSURGICAL) ×2
ELECTRODE REM PT RTRN 9FT ADLT (ELECTROSURGICAL) ×1 IMPLANT
GLOVE BIO SURGEON STRL SZ7.5 (GLOVE) ×4 IMPLANT
GLOVE BIOGEL M STRL SZ7.5 (GLOVE) ×4 IMPLANT
GLOVE BIOGEL PI IND STRL 7.5 (GLOVE) ×1 IMPLANT
GLOVE BIOGEL PI INDICATOR 7.5 (GLOVE) ×1
GLOVE INDICATOR 8.0 STRL GRN (GLOVE) ×2 IMPLANT
GOWN STRL REUS W/ TWL LRG LVL3 (GOWN DISPOSABLE) ×2 IMPLANT
GOWN STRL REUS W/ TWL XL LVL3 (GOWN DISPOSABLE) ×1 IMPLANT
GOWN STRL REUS W/TWL LRG LVL3 (GOWN DISPOSABLE) ×2
GOWN STRL REUS W/TWL XL LVL3 (GOWN DISPOSABLE) ×1
HEAD M SROM 36MM PLUS 1.5 (Hips) IMPLANT
HEMOVAC 400CC 10FR (MISCELLANEOUS) ×2 IMPLANT
HOLDER FOLEY CATH W/STRAP (MISCELLANEOUS) ×2 IMPLANT
HOOD PEEL AWAY FLYTE STAYCOOL (MISCELLANEOUS) ×4 IMPLANT
IRRIGATION SURGIPHOR STRL (IV SOLUTION) ×2 IMPLANT
KIT PEG BOARD PINK (KITS) ×2 IMPLANT
KIT TURNOVER KIT A (KITS) ×2 IMPLANT
LINER ACETAB NEUTRAL 36ID 520D (Liner) ×1 IMPLANT
MANIFOLD NEPTUNE II (INSTRUMENTS) ×4 IMPLANT
NDL SAFETY ECLIPSE 18X1.5 (NEEDLE) ×1 IMPLANT
NEEDLE HYPO 18GX1.5 SHARP (NEEDLE) ×1
NS IRRIG 500ML POUR BTL (IV SOLUTION) ×2 IMPLANT
OIL CARTRIDGE MAESTRO DRILL (MISCELLANEOUS) ×2
PACK HIP PROSTHESIS (MISCELLANEOUS) ×2 IMPLANT
PENCIL SMOKE ULTRAEVAC 22 CON (MISCELLANEOUS) ×2 IMPLANT
PULSAVAC PLUS IRRIG FAN TIP (DISPOSABLE) ×2
SOL .9 NS 3000ML IRR  AL (IV SOLUTION) ×1
SOL .9 NS 3000ML IRR UROMATIC (IV SOLUTION) ×1 IMPLANT
SOL PREP PVP 2OZ (MISCELLANEOUS) ×2
SOLUTION PREP PVP 2OZ (MISCELLANEOUS) ×1 IMPLANT
SPONGE DRAIN TRACH 4X4 STRL 2S (GAUZE/BANDAGES/DRESSINGS) ×2 IMPLANT
SROM M HEAD 36MM PLUS 1.5 (Hips) ×2 IMPLANT
STAPLER SKIN PROX 35W (STAPLE) ×2 IMPLANT
STEM FEM CMNTLSS SM AML 15.0 (Hips) ×1 IMPLANT
SUT ETHIBOND #5 BRAIDED 30INL (SUTURE) ×2 IMPLANT
SUT VIC AB 0 CT1 36 (SUTURE) ×2 IMPLANT
SUT VIC AB 1 CT1 36 (SUTURE) ×4 IMPLANT
SUT VIC AB 2-0 CT1 27 (SUTURE) ×1
SUT VIC AB 2-0 CT1 TAPERPNT 27 (SUTURE) ×1 IMPLANT
SYR 20ML LL LF (SYRINGE) ×2 IMPLANT
SYR BULB IRRIG 60ML STRL (SYRINGE) ×1 IMPLANT
TAPE CLOTH 3X10 WHT NS LF (GAUZE/BANDAGES/DRESSINGS) ×2 IMPLANT
TAPE TRANSPORE STRL 2 31045 (GAUZE/BANDAGES/DRESSINGS) ×2 IMPLANT
TIP FAN IRRIG PULSAVAC PLUS (DISPOSABLE) ×1 IMPLANT
TOWEL OR 17X26 4PK STRL BLUE (TOWEL DISPOSABLE) ×2 IMPLANT
TRAY FOLEY MTR SLVR 16FR STAT (SET/KITS/TRAYS/PACK) ×2 IMPLANT

## 2020-06-21 NOTE — Anesthesia Preprocedure Evaluation (Signed)
Anesthesia Evaluation  Patient identified by MRN, date of birth, ID band Patient awake    Reviewed: Allergy & Precautions, H&P , NPO status , Patient's Chart, lab work & pertinent test results, reviewed documented beta blocker date and time   History of Anesthesia Complications (+) DIFFICULT AIRWAY and history of anesthetic complications  Airway Mallampati: III   Neck ROM: full    Dental  (+) Teeth Intact   Pulmonary asthma , sleep apnea , COPD, former smoker,    Pulmonary exam normal        Cardiovascular Exercise Tolerance: Poor hypertension, On Medications Normal cardiovascular exam Rhythm:regular Rate:Normal     Neuro/Psych PSYCHIATRIC DISORDERS  Neuromuscular disease    GI/Hepatic negative GI ROS, Neg liver ROS,   Endo/Other  Morbid obesity  Renal/GU negative Renal ROS  negative genitourinary   Musculoskeletal   Abdominal   Peds  Hematology negative hematology ROS (+)   Anesthesia Other Findings Past Medical History: No date: Arthritis     Comment:  elbow, hands, neck  No date: Asthma     Comment:  allergy induced asthma-only exacerbated by smells like               perfume, smoke etc 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  couldn't talk due to tube No date: Edema     Comment:  LEGS/FEET 2009: Hypercalcemia     Comment:  calcium  WNL 10.2 on 04-2015 No date: Hyperlipidemia No date: Obesity No date: Urinary incontinence Past Surgical History: No date: ABDOMINAL HYSTERECTOMY      Comment:  one ovary remains 01/31/2015: ANTERIOR CERVICAL DECOMP/DISCECTOMY FUSION; N/A     Comment:  Procedure: ANTERIOR CERVICAL DECOMPRESSION/DISCECTOMY               FUSION CERVICAL FIVE SIX;  Surgeon: Karie Chimera, MD;                Location: [facility];  Service: Neurosurgery;                Laterality: N/A; No date: BACK SURGERY     Comment:  C 6-C7 neck  02/06/2017: CATARACT EXTRACTION W/PHACO; Left     Comment:  Procedure: CATARACT EXTRACTION PHACO AND INTRAOCULAR               LENS PLACEMENT (IOC);  Surgeon: Eulogio Bear, MD;                Location: [facility];  Service: Ophthalmology;  Laterality:              Left;  Lot# [id number] H [place]: 00:37.9 AP%: 7.9 CDE: 2.97 03/06/2017: CATARACT EXTRACTION W/PHACO; Right     Comment:  Procedure: CATARACT EXTRACTION PHACO AND INTRAOCULAR               LENS PLACEMENT (IOC);  Surgeon: Eulogio Bear, MD;  Location: [facility];  Service: Ophthalmology;  Laterality:              Right;  Lot #[id number] H [place]: 00.22.0 AP%: 7.5 CDE: 1.64  No date: COLONOSCOPY 09/23/2017: COLONOSCOPY WITH PROPOFOL; N/A     Comment:  Procedure: COLONOSCOPY WITH PROPOFOL;  Surgeon: [place],               Benay Pike, MD;  Location: [facility];  Service:               Gastroenterology;  Laterality: N/A; 04/2003: ELBOW SURGERY; Left     Comment:  x2  & for repair & then remove all hardware, due to               injury from a fall No date: EYE SURGERY; Bilateral     Comment:  cataracts No date: FRACTURE SURGERY; Right     Comment:  shoulder and arm 2003: FRACTURE SURGERY; Left     Comment:  elbow 2006: HAND SURGERY; Right     Comment:  injury- then eventually lost remainder of thumb 01/23/2016: KNEE ARTHROSCOPY WITH LATERAL MENISECTOMY; Left     Comment:  Procedure: KNEE ARTHROSCOPY WITH LATERAL MENISECTOMY;                Surgeon: Corky Mull, MD;  Location: [facility] OR[facility];  Service:              Orthopedics;  Laterality: Left; 08/31/2015: ORIF  HUMERUS FRACTURE; Right     Comment:  Procedure: OPEN REDUCTION INTERNAL FIXATION (ORIF)               PROXIMAL HUMERUS FRACTURE w/ #2 fberwire;  Surgeon: Corky Mull, MD;  Location: [facility];  Service: Orthopedics;               Laterality: Right; 2005: THUMB AMPUTATION     Comment:  partial BMI    Body Mass Index: 40.83 kg/m     Reproductive/Obstetrics negative OB ROS                             Anesthesia Physical Anesthesia Plan  ASA: III  Anesthesia Plan: Spinal   Post-op Pain Management:    Induction:   PONV Risk Score and Plan: 3  Airway Management Planned:   Additional Equipment:   Intra-op Plan:   Post-operative Plan:   Informed Consent: I have reviewed the patients History and Physical, chart, labs and discussed the procedure including the risks, benefits and alternatives for the proposed anesthesia with the patient or authorized representative who has indicated his/her understanding and acceptance.     Dental Advisory Given  Plan Discussed with: CRNA  Anesthesia Plan Comments:         Anesthesia Quick Evaluation

## 2020-06-21 NOTE — Evaluation (Signed)
Physical Therapy Evaluation Patient Details Name: Laura Hale MRN: [id number] DOB: 04/08/1946 Today's Date: 06/21/2020   History of Present Illness  74 y/o female s/p L total hip replacement (posterior approach) 11/10.  Clinical Impression  Pt eager to work with PT and ultimately did very well.  She and husband attentive and eager to gather information and ask questions. Educated on precautions, expected course of recovery, mobility, transfers, home set up, etc.  She showed great effort with exercises and showed good quad set and quality hip motion, unable to do SLRs w/o assist.  Only very minimal assist needed for transition to sitting and then to standing.  She was able to ambulate ~45 ft with slow but safe gait.  Pt's O2 remained in the mid/high 90s on room air t/o the session, good overall POD0 session and expect pt to continue to good trajectory of progress.    Follow Up Recommendations Home health PT    Equipment Recommendations  3in1 (PT)    Recommendations for Other Services       Precautions / Restrictions Precautions Precautions: Posterior Hip;Fall Restrictions Weight Bearing Restrictions: Yes LLE Weight Bearing: Weight bearing as tolerated      Mobility  Bed Mobility Overal bed mobility: Needs Assistance Bed Mobility: Supine to Sit     Supine to sit: Min assist;Min guard     General bed mobility comments: Pt showed great effort with getting to EOB, she was able to maintain precautions and showed slow but steady shift to EOB with rail use and scooting hips.  Consistent cuing but very little phyiscal assist.    Transfers Overall transfer level: Needs assistance Equipment used: Rolling walker (2 wheeled) Transfers: Sit to/from Stand Sit to Stand: Min guard;Min assist         General transfer comment: Again great effort, she struggled to attain fully upright on first 2 attempts, but with increased cuing for set up and UE use she was able to rise with  only very light physical assist to attain standing  from standard height bed  Ambulation/Gait Ambulation/Gait assistance: Min guard Gait Distance (Feet): 45 Feet Assistive device: Rolling walker (2 wheeled)       General Gait Details: Pt did well with first post-op ambulation effort.  She initially showed expected hesitancy with L WBing but quickly acclimated and though she did not attain consistent walker motion she was not overly reliant on the walker and was able to take reciprocal steps with appropriate L stance time  Stairs            Wheelchair Mobility    Modified Rankin (Stroke Patients Only)       Balance Overall balance assessment: Modified Independent                                           Pertinent Vitals/Pain Pain Assessment: 0-10 Pain Score: 4  Pain Location: L hip/incision site    Home Living Family/patient expects to be discharged to:: Private residence Living Arrangements: Spouse/significant other Available Help at Discharge: Family;Available 24 hours/day   Home Access: Stairs to enter Entrance Stairs-Rails: Can reach both (b/l rails at each tier) Entrance Stairs-Number of Steps: 3+2+1 Home Layout: One level Home Equipment: Walker - 2 wheels;Cane - single point      Prior Function Level of Independence: Independent         Comments: will occasionally  use cane when stiff     Hand Dominance        Extremity/Trunk Assessment   Upper Extremity Assessment Upper Extremity Assessment: Overall WFL for tasks assessed    Lower Extremity Assessment Lower Extremity Assessment: Overall WFL for tasks assessed (expected post-op weakness)       Communication   Communication: No difficulties  Cognition Arousal/Alertness: Awake/alert Behavior During Therapy: WFL for tasks assessed/performed Overall Cognitive Status: Within Functional Limits for tasks assessed                                         General Comments      Exercises Total Joint Exercises Ankle Circles/Pumps: AROM;20 reps Quad Sets: Strengthening;10 reps Short Arc Quad: Strengthening;10 reps Heel Slides: AROM;10 reps (with resisted leg extensions) Hip ABduction/ADduction: Strengthening;10 reps Straight Leg Raises: AAROM;5 reps (unable to lift against gravity w/o assist)   Assessment/Plan    PT Assessment Patient needs continued PT services  PT Problem List Decreased strength;Decreased range of motion;Decreased activity tolerance;Decreased balance;Decreased mobility;Decreased coordination;Decreased knowledge of use of DME;Decreased safety awareness;Pain       PT Treatment Interventions DME instruction;Gait training;Stair training;Functional mobility training;Therapeutic activities;Therapeutic exercise;Balance training    PT Goals (Current goals can be found in the Care Plan section)  Acute Rehab PT Goals Patient Stated Goal: go home PT Goal Formulation: With patient Time For Goal Achievement: 07/05/20 Potential to Achieve Goals: Good    Frequency BID   Barriers to discharge        Co-evaluation               AM-PAC PT "6 Clicks" Mobility  Outcome Measure Help needed turning from your back to your side while in a flat bed without using bedrails?: A Little Help needed moving from lying on your back to sitting on the side of a flat bed without using bedrails?: A Little Help needed moving to and from a bed to a chair (including a wheelchair)?: A Little Help needed standing up from a chair using your arms (e.g., wheelchair or bedside chair)?: A Little Help needed to walk in hospital room?: A Little Help needed climbing 3-5 steps with a railing? : A Lot 6 Click Score: 17    End of Session Equipment Utilized During Treatment: Gait belt Activity Tolerance: Patient tolerated treatment well Patient left: with chair alarm set;with call bell/phone within reach Nurse Communication: Mobility status (O2  stayed in high 90s on room air) PT Visit Diagnosis: Muscle weakness (generalized) (M62.81);Difficulty in walking, not elsewhere classified (R26.2);Pain Pain - Right/Left: Left Pain - part of body: Hip    Time: 2094-7096 PT Time Calculation (min) (ACUTE ONLY): 57 min   Charges:   PT Evaluation $PT Eval Low Complexity: 1 Low PT Treatments $Gait Training: 8-22 mins $Therapeutic Exercise: 8-22 mins $Therapeutic Activity: 8-22 mins        Kreg Shropshire, DPT 06/21/2020, 4:27 PM

## 2020-06-21 NOTE — Transfer of Care (Signed)
Immediate Anesthesia Transfer of Care Note  Patient: Laura Hale  Procedure(s) Performed: TOTAL HIP ARTHROPLASTY (Left Hip)  Patient Location: PACU  Anesthesia Type:spinal  Level of Consciousness: awake, alert  and oriented  Airway & Oxygen Therapy: Patient Spontanous Breathing and Patient connected to face mask oxygen  Post-op Assessment: Report given to RN and Post -op Vital signs reviewed and stable  Post vital signs: Reviewed and stable  Last Vitals:  Vitals Value Taken Time  BP 142/75 06/21/20 1120  Temp    Pulse 104 06/21/20 1121  Resp 25 06/21/20 1121  SpO2 100 % 06/21/20 1121  Vitals shown include unvalidated device data.  Last Pain:  Vitals:   06/21/20 0629  TempSrc: Tympanic  PainSc: 3          Complications: No complications documented.

## 2020-06-21 NOTE — H&P (Signed)
The patient has been re-examined, and the chart reviewed, and there have been no interval changes to the documented history and physical.    The risks, benefits, and alternatives have been discussed at length. The patient expressed understanding of the risks benefits and agreed with plans for surgical intervention.  Luciano Cinquemani P. Kioni Stahl, Jr. M.D.    

## 2020-06-21 NOTE — Anesthesia Procedure Notes (Signed)
Spinal  Patient location during procedure: OR Start time: 06/21/2020 7:20 AM End time: 06/21/2020 7:24 AM Staffing Performed: resident/CRNA  Anesthesiologist: Molli Barrows, MD Resident/CRNA: Norm Salt, CRNA Preanesthetic Checklist Completed: patient identified, IV checked, site marked, risks and benefits discussed, surgical consent, monitors and equipment checked and pre-op evaluation Spinal Block Patient position: sitting Prep: ChloraPrep Patient monitoring: heart rate, continuous pulse ox and blood pressure Approach: midline Location: L3-4 Injection technique: single-shot Needle Needle type: Pencan  Needle gauge: 24 G Needle length: 10 cm Assessment Sensory level: T4

## 2020-06-21 NOTE — Op Note (Signed)
OPERATIVE NOTE  DATE OF SURGERY:  06/21/2020  PATIENT NAME:  Laura Hale   DOB: April 17, 1946  MRN: [id number]  PRE-OPERATIVE DIAGNOSIS: Degenerative arthrosis of the left hip, primary  POST-OPERATIVE DIAGNOSIS:  Same  PROCEDURE:  Left total hip arthroplasty  SURGEON:  Marciano Sequin. M.D.  ASSISTANT: Cassell Smiles, PA-C (present and scrubbed throughout the case, critical for assistance with exposure, retraction, instrumentation, and closure)  ANESTHESIA: spinal  ESTIMATED BLOOD LOSS: 200 mL  FLUIDS REPLACED: 1100 mL of crystalloid  DRAINS: 2 medium Hemovac  IMPLANTS UTILIZED: DePuy 15 mm small stature AML femoral stem, 52 mm OD Pinnacle 100 acetabular component, neutral Pinnacle Marathon polyethylene insert, and a 36 mm M-SPEC +1.5 mm hip ball  INDICATIONS FOR SURGERY: Laura Hale is a 74 y.o. year old female with a long history of progressive hip and groin  pain. X-rays demonstrated severe degenerative changes. The patient had not seen any significant improvement despite conservative nonsurgical intervention. After discussion of the risks and benefits of surgical intervention, the patient expressed understanding of the risks benefits and agree with plans for total hip arthroplasty.   The risks, benefits, and alternatives were discussed at length including but not limited to the risks of infection, bleeding, nerve injury, stiffness, blood clots, the need for revision surgery, limb length inequality, dislocation, cardiopulmonary complications, among others, and they were willing to proceed.  PROCEDURE IN DETAIL: The patient was brought into the operating room and, after adequate spinal anesthesia was achieved, the patient was placed in a right lateral decubitus position. Axillary roll was placed and all bony prominences were well-padded. The patient's left hip was cleaned and prepped with alcohol and DuraPrep and draped in the usual sterile fashion. A "timeout" was  performed as per usual protocol. A lateral curvilinear incision was made gently curving towards the posterior superior iliac spine. The IT band was incised in line with the skin incision and the fibers of the gluteus maximus were split in line. The piriformis tendon was identified, skeletonized, and incised at its insertion to the proximal femur and reflected posteriorly. A T type posterior capsulotomy was performed. Prior to dislocation of the femoral head, a threaded Steinmann pin was inserted through a separate stab incision into the pelvis superior to the acetabulum and bent in the form of a stylus so as to assess limb length and hip offset throughout the procedure. The femoral head was then dislocated posteriorly. Inspection of the femoral head demonstrated severe degenerative changes with full-thickness loss of articular cartilage. The femoral neck cut was performed using an oscillating saw. The anterior capsule was elevated off of the femoral neck using a periosteal elevator. Attention was then directed to the acetabulum. The remnant of the labrum was excised using electrocautery. Inspection of the acetabulum also demonstrated significant degenerative changes. The acetabulum was reamed in sequential fashion up to a 51 mm diameter. Good punctate bleeding bone was encountered. A 52 mm Pinnacle 100 acetabular component was positioned and impacted into place. Good scratch fit was appreciated. A neutral polyethylene trial was inserted.  Attention was then directed to the proximal femur. A hole for reaming of the proximal femoral canal was created using a high-speed burr. The femoral canal was reamed in sequential fashion up to a 14.5 mm diameter. This allowed for approximately 5 cm of scratch fit. Serial broaches were inserted up to a 15 mm small stature femoral broach. Calcar region was planed and a trial reduction was performed using a 36 mm hip  ball with a +1.5 mm neck length. Good equalization of limb  lengths and hip offset was appreciated and excellent stability was noted both anteriorly and posteriorly. Trial components were removed. The acetabular shell was irrigated with copious amounts of normal saline with antibiotic solution and suctioned dry. A neutral Pinnacle Marathon polyethylene insert was positioned and impacted into place. Next, a 15 mm small stature AML femoral stem was positioned and impacted into place. Excellent scratch fit was appreciated. A trial reduction was again performed with a 36 mm hip ball with a +1.5 mm neck length. Again, good equalization of limb lengths was appreciated and excellent stability appreciated both anteriorly and posteriorly. The hip was then dislocated and the trial hip ball was removed. The Morse taper was cleaned and dried. A 36 mm M-SPEC hip ball with a +1.5 mm neck length was placed on the trunnion and impacted into place. The hip was then reduced and placed through range of motion. Excellent stability was appreciated both anteriorly and posteriorly.  The wound was irrigated with copious amounts of normal saline followed by 500 ml of Surgiphor and suctioned dry. Good hemostasis was appreciated. The posterior capsulotomy was repaired using #5 Ethibond. Piriformis tendon was reapproximated to the undersurface of the gluteus medius tendon using #5 Ethibond. The IT band was reapproximated using interrupted sutures of #1 Vicryl.  2 medium Hemovac drains were placed wound bed brought through separate stab incision to be attached to Hemovac reservoir.  Subcutaneous tissue was approximated using first #0 Vicryl followed by #2-0 Vicryl. The skin was closed with skin staples.  The patient tolerated the procedure well and was transported to the recovery room in stable condition.   Marciano Sequin., M.D.

## 2020-06-21 NOTE — Anesthesia Procedure Notes (Signed)
Procedure Name: MAC Date/Time: 06/21/2020 7:30 AM Performed by: Lily Peer, Diego Ulbricht, CRNA Pre-anesthesia Checklist: Patient identified, Emergency Drugs available, Suction available and Patient being monitored Patient Re-evaluated:Patient Re-evaluated prior to induction Oxygen Delivery Method: Simple face mask

## 2020-06-22 ENCOUNTER — Encounter: Payer: Self-pay | Admitting: Orthopedic Surgery

## 2020-06-22 MED ORDER — SODIUM CHLORIDE 0.9 % IV SOLN
INTRAVENOUS | Status: DC | PRN
  Administered 2020-06-22: 500 mL via INTRAVENOUS

## 2020-06-22 NOTE — Progress Notes (Signed)
ORTHOPAEDICS PROGRESS NOTE  PATIENT NAME: Laura Hale DOB: 1945/11/09  MRN: [id number]  POD # 1t: Left total hip arthroplasty  Subjective: The patient rested well last night.  Pain has been under good control.  She denies any nausea or vomiting. The patient did extremely well with physical therapy on the afternoon of surgery.  Objective: Vital signs in last 24 hours: Temp:  [97.6 F (36.4 C)-98.5 F (36.9 C)] 97.7 F (36.5 C) (11/11 0353) Pulse Rate:  [68-104] 68 (11/11 0353) Resp:  [10-25] 18 (11/11 0353) BP: (114-142)/(61-78) 122/61 (11/11 0353) SpO2:  [93 %-100 %] 97 % (11/11 0353)  Intake/Output from previous day: 11/10 0701 - 11/11 0700 In: 1791.4 [I.V.:1534.7; IV Piggyback:256.7] Out: 680 [Urine:150; Drains:230; Blood:300]  No results for input(s): WBC, HGB, HCT, PLT, K, CL, CO2, BUN, CREATININE, GLUCOSE, CALCIUM, LABPT, INR in the last 72 hours.  EXAM General: Well-developed well-nourished female seen in no apparent discomfort. Lungs: clear to auscultation Cardiac: normal rate and regular rhythm  Abdomen: Soft, nontender, nondistended.  Bowel sounds are present. Right lower extremity: Hip dressing is dry and intact.  No gross ecchymosis or swelling.  Hemovac drain is in place and functioning.  Homans test is negative. Neurologic: Awake, alert, and oriented.  Sensory and motor function are intact.  Assessment: Left total hip arthroplasty  Secondary diagnoses: Morbid obesity Hyperlipidemia COPD  Plan: Today's goals were reviewed with the patient.  Continue physical therapy and Occupational Therapy as per total hip arthroplasty rehab protocol. The patient may be weightbearing as tolerated to the left lower extremity.  Posterior precautions are to be followed. Plan is to go Home after hospital stay. DVT Prophylaxis - Lovenox, Foot Pumps and TED hose  Aviance Cooperwood P. Holley Bouche M.D.

## 2020-06-22 NOTE — Progress Notes (Signed)
Physical Therapy Treatment Patient Details Name: Laura Hale MRN: [id number] DOB: 1946-02-13 Today's Date: 06/22/2020    History of Present Illness 74 y/o female s/p L total hip replacement (posterior approach) 11/10. PMH includes: Morbid obesity, Hyperlipidemia, and COPD.    PT Comments    Pt tolerated treatment well today, and was able to improve overall assistance levels, ambulation distance, quality of gait, and activity tolerance since last session. Despite progress, pt continues to be limited with further mobility secondary to 4/10 pain in L hip with WB. Pt able to recall 3/3 hip precautions, but required cueing for WB precautions, demonstrating "fair" carry over. Pt will continue to benefit from skilled acute PT services to address deficits for return to baseline function. Will continue to recommend DC home with HHPT and 3in1 to improve overall safety with functional mobility.      Follow Up Recommendations  Home health PT     Equipment Recommendations  3in1 (PT)    Recommendations for Other Services       Precautions / Restrictions Precautions Precautions: Posterior Hip;Fall Precaution Booklet Issued: Yes (comment) Restrictions Weight Bearing Restrictions: Yes LLE Weight Bearing: Weight bearing as tolerated    Mobility  Bed Mobility Overal bed mobility: Needs Assistance Bed Mobility: Supine to Sit     Supine to sit: Supervision;HOB elevated     General bed mobility comments: Pt seated in recliner upon PT entry  Transfers Overall transfer level: Needs assistance Equipment used: Rolling walker (2 wheeled) Transfers: Sit to/from Stand;Anterior-Posterior Transfer Sit to Stand: Supervision     Anterior-Posterior transfers: Supervision   General transfer comment: Supervision for safety to perform A/P scooting in recliner and to stand from recliner with RW. Pt demonstrates good safety awareness with hand placement, but required verbal cues for LLE  placement for maintenance of posterior hip precautions.  Ambulation/Gait Ambulation/Gait assistance: Supervision Gait Distance (Feet): 80 Feet Assistive device: Rolling walker (2 wheeled)       General Gait Details: Pt required supervision for safety to ambulate in hallway with RW. Pt initially demonstrated early reciprocal gait, but was able to progress to reciprocal gait pattern. Pt demonstrated increased BUE reliance on RW with LLE stance, and was able to improve LLE heel strike and L knee flexion during swing with verbal cues.   Stairs             Wheelchair Mobility    Modified Rankin (Stroke Patients Only)       Balance Overall balance assessment: Modified Independent                                          Cognition Arousal/Alertness: Awake/alert Behavior During Therapy: WFL for tasks assessed/performed Overall Cognitive Status: Within Functional Limits for tasks assessed                                        Exercises Total Joint Exercises Ankle Circles/Pumps: AROM;20 reps;Both Quad Sets: Strengthening;10 reps;Left Gluteal Sets: Both;Strengthening;10 reps Short Arc Quad: Strengthening;10 reps;Left Heel Slides: AROM;10 reps;Left Hip ABduction/ADduction: Strengthening;10 reps;Left Straight Leg Raises: AAROM;Left;10 reps (required mod assist to lift LLE) Other Exercises Other Exercises: Pt able to recall 3/3 posterior hip precautions, but required cueing for LLE WB precautions. Pt able to perform all exercises in posterior hip HEP packet, but  required mod assist for L SLR secondary to pain and weakness.    General Comments        Pertinent Vitals/Pain Pain Assessment: 0-10 Pain Score: 4  Pain Location: L hip/incision site Pain Descriptors / Indicators: Dull;Aching Pain Intervention(s): Monitored during session;Premedicated before session;Repositioned    Home Living Family/patient expects to be discharged to::  Private residence Living Arrangements: Spouse/significant other Available Help at Discharge: Family;Available 24 hours/day Type of Home: House Home Access: Stairs to enter Entrance Stairs-Rails: Can reach both Home Layout: One level Home Equipment: Walker - 2 wheels;Cane - single point;Shower seat      Prior Function Level of Independence: Independent      Comments: will occasionally use cane when stiff   PT Goals (current goals can now be found in the care plan section) Acute Rehab PT Goals Patient Stated Goal: go home PT Goal Formulation: With patient Time For Goal Achievement: 07/05/20 Potential to Achieve Goals: Good Progress towards PT goals: Progressing toward goals    Frequency    BID      PT Plan Current plan remains appropriate    Co-evaluation              AM-PAC PT "6 Clicks" Mobility   Outcome Measure  Help needed turning from your back to your side while in a flat bed without using bedrails?: A Little Help needed moving from lying on your back to sitting on the side of a flat bed without using bedrails?: A Little Help needed moving to and from a bed to a chair (including a wheelchair)?: A Little Help needed standing up from a chair using your arms (e.g., wheelchair or bedside chair)?: A Little Help needed to walk in hospital room?: A Little Help needed climbing 3-5 steps with a railing? : A Lot 6 Click Score: 17    End of Session Equipment Utilized During Treatment: Gait belt Activity Tolerance: Patient tolerated treatment well Patient left: with chair alarm set;with call bell/phone within reach;with family/visitor present;Other (comment) (BLE elevated on pillow) Nurse Communication: Mobility status PT Visit Diagnosis: Muscle weakness (generalized) (M62.81);Difficulty in walking, not elsewhere classified (R26.2);Pain Pain - Right/Left: Left Pain - part of body: Hip     Time: 4097-3532 PT Time Calculation (min) (ACUTE ONLY): 19  min  Charges:  $Therapeutic Exercise: 8-22 mins                    Herminio Commons, PT, DPT 12:18 PM,06/22/20

## 2020-06-22 NOTE — Evaluation (Signed)
Occupational Therapy Evaluation Patient Details Name: Laura Hale MRN: [id number] DOB: [birth year] Today's Date: 06/22/2020    History of Present Illness 74 y/o female s/p L total hip replacement (posterior approach) 11/10. PMH includes: Morbid obesity, Hyperlipidemia, and COPD.   Clinical Impression   Pt seen for OT evaluation this date POD #1 from L THA (posterior). Pt reports being INDEP at baseline with all ADLs/IADLs with occasional use of SPC for fxl mobility when she's stiff, but primarily no AD for fxl mobility. Pt is pleasant and eating breakfast when OT presents. OT begins session with education while pt finishes her breakfast. OT facilitates ed re: THPs and impact on ADLs. Ed re: use of AE for LB ADLs including handout, Environmental manager. Pt with good understanding/rececption and return demo. At start of session, pt states she did not remember THPs, but she is able to name 2/3 at end of session after education. On assessment, pt is able to complete bed mobility with SUPV and ADL transfers with RW with CGA with only one verbal cue for hand/foot placement. PT demos good standing tolerance x3-4 mins while completing morning ADLs. Pt able to perform fxl mobility with CGA initially, with progress to SUPV with RW. Pt left in chair with chair alarm and all needs in reach. Anticipate pt could benefit from f/u with [facility] upon d/c for continued LB ADL training as she is currently requiring MOD A versus INDEP at baseline.    Follow Up Recommendations  Home health OT    Equipment Recommendations  3 in 1 bedside commode;Other (comment) (adaptive equipment for LB ADLs including sock aide, reacher, LH shoehorn and LH sponge.)    Recommendations for Other Services       Precautions / Restrictions Precautions Precautions: Posterior Hip;Fall Restrictions Weight Bearing Restrictions: Yes LLE Weight Bearing: Weight bearing as tolerated      Mobility Bed Mobility Overal bed  mobility: Needs Assistance Bed Mobility: Supine to Sit     Supine to sit: Supervision;HOB elevated     General bed mobility comments: MIN verbal cues to propel toward L edge of bed with non-op R LE. Pt demos good effort.    Transfers Overall transfer level: Needs assistance Equipment used: Rolling walker (2 wheeled) Transfers: Sit to/from Stand Sit to Stand: Min guard         General transfer comment: Pt with good control for CTS, CGA for steadying, but ultimately able to complete sit to stand from EOB without extensive physical assist and only one cue for safe hand/foot placement.    Balance Overall balance assessment: Modified Independent                                         ADL either performed or assessed with clinical judgement   ADL                                         General ADL Comments: MOD A for LB ADLs, SETUP for UB ADLs. AE education provided.     Vision Baseline Vision/History: Wears glasses Wears Glasses: At all times Patient Visual Report: No change from baseline       Perception     Praxis      Pertinent Vitals/Pain Pain Assessment: 0-10 Pain Score: 3  Pain  Location: L hip/incision site Pain Descriptors / Indicators: Tender Pain Intervention(s): Monitored during session;Premedicated before session     Hand Dominance Right   Extremity/Trunk Assessment Upper Extremity Assessment Upper Extremity Assessment: LUE deficits/detail;RUE deficits/detail RUE Deficits / Details: ROM WFL, MMT grossly 4/5 LUE Deficits / Details: h/o broken elbow, limited rotation. Shld ROM WFL. Grip MMT grossly 4/5   Lower Extremity Assessment Lower Extremity Assessment: Overall WFL for tasks assessed       Communication Communication Communication: No difficulties   Cognition Arousal/Alertness: Awake/alert Behavior During Therapy: WFL for tasks assessed/performed Overall Cognitive Status: Within Functional Limits for  tasks assessed                                     General Comments       Exercises Other Exercises Other Exercises: OT facilitates ed re: THPs and impact on ADLs. Ed re: use of AE for LB ADLs including handout, Environmental manager. Pt with good understanding. Pt states she did not remember THPs at beginning of session and is able to name 2/3 at end of session after education.   Shoulder Instructions      Home Living Family/patient expects to be discharged to:: Private residence Living Arrangements: Spouse/significant other Available Help at Discharge: Family;Available 24 hours/day Type of Home: House Home Access: Stairs to enter [employer] of Steps: 3+2+1 Entrance Stairs-Rails: Can reach both Home Layout: One level           Bathroom Accessibility: Yes   Home Equipment: Walker - 2 wheels;Cane - single point;Shower seat          Prior Functioning/Environment Level of Independence: Independent        Comments: will occasionally use cane when stiff        OT Problem List: Decreased strength;Decreased activity tolerance;Impaired balance (sitting and/or standing);Decreased knowledge of use of DME or AE;Pain      OT Treatment/Interventions: Self-care/ADL training;DME and/or AE instruction;Therapeutic activities;Balance training;Therapeutic exercise;Patient/family education    OT Goals(Current goals can be found in the care plan section) Acute Rehab OT Goals Patient Stated Goal: go home OT Goal Formulation: With patient Time For Goal Achievement: 07/06/20 Potential to Achieve Goals: Good ADL Goals Pt Will Perform Lower Body Dressing: with set-up;with adaptive equipment;sit to/from stand Pt Will Transfer to Toilet: with modified independence;ambulating;bedside commode (BSC over toilet in restroom, with LRAD to restroom) Additional ADL Goal #1: Pt will verbalize 3 THPs with no verbal cues  OT Frequency: Min 1X/week   Barriers to D/C:             Co-evaluation              AM-PAC OT "6 Clicks" Daily Activity     Outcome Measur e Help from another person eating meals?: None Help from another person taking care of personal grooming?: A Little Help from another person toileting, which includes using toliet, bedpan, or urinal?: A Little Help from another person bathing (including washing, rinsing, drying)?: A Little Help from another person to put on and taking off regular upper body clothing?: None Help from another person to put on and taking off regular lower body clothing?: A Little 6 Click Score: 20   End of Session Equipment Utilized During Treatment: Gait belt;Rolling walker Nurse Communication: Mobility status  Activity Tolerance: Patient tolerated treatment well Patient left: in chair;with call bell/phone within reach  OT Visit Diagnosis: Unsteadiness on  feet (R26.81)                Time: 7014-1030 OT Time Calculation (min): 55 min Charges:  OT General Charges $OT Visit: 1 Visit OT Evaluation $OT Eval Moderate Complexity: 1 Mod OT Treatments $Self Care/Home Management : 23-37 mins $Therapeutic Activity: 8-22 mins  Gerrianne Scale, MS, OTR/L ascom [phone] 06/22/20, 9:33 AM

## 2020-06-22 NOTE — Progress Notes (Signed)
Physical Therapy Treatment Patient Details Name: Laura Hale MRN: [id number] DOB: [birth year] Today's Date: 06/22/2020    History of Present Illness 74 y/o female s/p L total hip replacement (posterior approach) 11/10. PMH includes: Morbid obesity, Hyperlipidemia, and COPD.    PT Comments    Pt tolerated treatment well today. Pt continues to improve with overall assistance levels, activity tolerance, and ambulation distance. Increased gait deviations noted with fatigue, including decreased step length, heel strike, and foot clearance which increase her risk of falls. However, pt demonstrates good safety awareness with all mobility and was able to recall 3/3 posterior hip precautions and LLE WB precaution without cueing, indicating "good" carry over. Pt with "fair" standing balance without UE support on RW as she was able to maintain static standing balance while washing hands after toileting. Plan to perform stair training tomorrow; pt agreeable. Pt will continue to benefit from skilled acute PT services to address deficits for return to baseline function. Will continue to recommend HHPT with 3in1 at DC for energy conservation and to improve safety with functional mobility.    Follow Up Recommendations  Home health PT     Equipment Recommendations  3in1 (PT)    Recommendations for Other Services       Precautions / Restrictions Precautions Precautions: Posterior Hip;Fall Precaution Booklet Issued: Yes (comment) Restrictions Weight Bearing Restrictions: Yes LLE Weight Bearing: Weight bearing as tolerated    Mobility  Bed Mobility Overal bed mobility: Needs Assistance Bed Mobility: Sit to Supine       Sit to supine: Min assist;HOB elevated   General bed mobility comments: Min assist for LLE facilitation onto bed with sit>supine transfer; otherwise pt required supervision with increased time, effort, and UE reliance on bedrail.  Transfers Overall transfer level: Needs  assistance Equipment used: Rolling walker (2 wheeled) Transfers: Sit to/from Stand;Anterior-Posterior Transfer Sit to Stand: Supervision     Anterior-Posterior transfers: Supervision   General transfer comment: Supervision for safety to perform A/P scooting in recliner and to stand from recliner with RW. Pt demonstrates good safety awareness with hand placement, but required verbal cues for LLE placement for maintenance of posterior hip precautions.  Ambulation/Gait Ambulation/Gait assistance: Supervision Gait Distance (Feet): 110 Feet Assistive device: Rolling walker (2 wheeled)       General Gait Details: Pt required supervision for safety to ambulate in hallway with RW. Pt initially demonstrated early reciprocal gait, but was able to progress to reciprocal gait pattern. Pt demonstrated increased BUE reliance on RW with LLE stance, and was able to improve LLE heel strike and L knee flexion during swing with verbal cues. Pt with decreased step length, heel strike, and foot clearance nearing end of ambulation secondary to fatigue.   Stairs             Wheelchair Mobility    Modified Rankin (Stroke Patients Only)       Balance Overall balance assessment: Modified Independent                                          Cognition Arousal/Alertness: Awake/alert Behavior During Therapy: WFL for tasks assessed/performed Overall Cognitive Status: Within Functional Limits for tasks assessed  Exercises Total Joint Exercises Ankle Circles/Pumps: AROM;20 reps;Both Quad Sets: Strengthening;10 reps;Left Gluteal Sets: Both;Strengthening;10 reps Short Arc Quad: Strengthening;10 reps;Left Heel Slides: AROM;10 reps;Left Hip ABduction/ADduction: Strengthening;10 reps;Left Straight Leg Raises: AAROM;Left;10 reps Other Exercises Other Exercises: Pt able to recall 3/3 posterior hip precautions and LLE WB  precautions without cueing. Other Exercises: Pt able to maintain static seated balance on commode for ~102min and static standing balance at sink for ~57min for hand hygiene. In standing, pt demonstrates wide BOS with LLE slightly forward and increased WB on RLE. Pt able to perform self care ADL's in standing with maintenance of posterior hip precautions.    General Comments        Pertinent Vitals/Pain Pain Assessment: 0-10 Pain Score: 4  Pain Location: L hip/incision site Pain Descriptors / Indicators: Dull;Aching Pain Intervention(s): Monitored during session;Premedicated before session;Repositioned    Home Living                      Prior Function            PT Goals (current goals can now be found in the care plan section) Acute Rehab PT Goals Patient Stated Goal: go home PT Goal Formulation: With patient Time For Goal Achievement: 07/05/20 Potential to Achieve Goals: Good Progress towards PT goals: Progressing toward goals    Frequency    BID      PT Plan Current plan remains appropriate    Co-evaluation              AM-PAC PT "6 Clicks" Mobility   Outcome Measure  Help needed turning from your back to your side while in a flat bed without using bedrails?: A Little Help needed moving from lying on your back to sitting on the side of a flat bed without using bedrails?: A Little Help needed moving to and from a bed to a chair (including a wheelchair)?: A Little Help needed standing up from a chair using your arms (e.g., wheelchair or bedside chair)?: A Little Help needed to walk in hospital room?: A Little Help needed climbing 3-5 steps with a railing? : A Lot 6 Click Score: 17    End of Session Equipment Utilized During Treatment: Gait belt Activity Tolerance: Patient tolerated treatment well Patient left: with call bell/phone within reach;Other (comment);in bed;with bed alarm set (BLE elevated on pillow) Nurse Communication: Mobility  status PT Visit Diagnosis: Muscle weakness (generalized) (M62.81);Difficulty in walking, not elsewhere classified (R26.2);Pain Pain - Right/Left: Left Pain - part of body: Hip     Time: 0712-1975 PT Time Calculation (min) (ACUTE ONLY): 23 min  Charges:  $Therapeutic Exercise: 8-22 mins $Therapeutic Activity: 8-22 mins                    Herminio Commons, PT, DPT 2:51 PM,06/22/20

## 2020-06-22 NOTE — TOC Initial Note (Signed)
Transition of Care Laura Hale) - Initial/Assessment Note    Patient Details  Name: Laura Hale MRN: [id number] Date of Birth: 1946-02-25  Transition of Care Laura Hale) CM/SW Contact:    Laura Ivan, LCSW Phone Number: 06/22/2020, 3:25 PM  Clinical Narrative:        CSW spoke with patient. Patient lives with her husband who provides transportation. PCP is Dr. Caryl Hale. Pharmacy is [facility] on [address]. Patient has a RW and grab bars in her shower. No SNF or HH history. Patient is aware that Surgeon's Office already arranged home health with Laura Hale and confirmed she wants to use this agency. CSW asked Laura Hale Rep Laura Hale to add OT (so patient will have HHPT and Laura Hale with Laura Hale). Patient agreeable to recommendation for 3 in 1. 3 in 1 ordered through [place].          Expected Hale Plan: Laura Hale: Continued Medical Work up   Patient Goals and CMS Choice Patient states their goals for this hospitalization and ongoing recovery are:: home with home health PT and OT CMS Medicare.gov Compare Post Acute Care list provided to:: Patient Choice offered to / list presented to : Patient  Expected Hale Plan and Services Expected Hale Plan: Laura Hale       Living arrangements for the past 2 months: [name]                 DME Arranged: 3-N-1 DME Agency: Laura Hale Date DME Agency Contacted: 06/22/20   Representative spoke with at DME Agency: Laura Hale HH Arranged: PT, OT [place] Agency: [facility] (formerly Laura Hale) Date [place]: 06/22/20   Representative spoke with at [place]: Laura Hale  Prior Living Arrangements/Services Living arrangements for the past 2 months: [name] with:: Spouse Patient language and need for interpreter reviewed:: Yes Do you feel safe going back to the place where you live?: Yes      Need for Family  Participation in Patient Care: Yes (Comment) Care giver support system in place?: Yes (comment) Current home services: DME Criminal Activity/Legal Involvement Pertinent to Current Situation/Hospitalization: No - Comment as needed  Activities of Daily Living Home Assistive Devices/Equipment: CPAP, Eyeglasses, Cane (specify quad or straight), Walker (specify type), Raised toilet seat with rails, Grab bars in shower, Shower chair without back ADL Screening (condition at time of admission) Patient's cognitive ability adequate to safely complete daily activities?: Yes Is the patient deaf or have difficulty hearing?: No Does the patient have difficulty seeing, even when wearing glasses/contacts?: No Does the patient have difficulty concentrating, remembering, or making decisions?: No Patient able to express need for assistance with ADLs?: Yes Does the patient have difficulty dressing or bathing?: No Independently performs ADLs?: Yes (appropriate for developmental age) Does the patient have difficulty walking or climbing stairs?: Yes Weakness of Legs: Left Weakness of Arms/Hands: Right  Permission Sought/Granted Permission sought to share information with : Laura Hale granted to share information with : Yes, Verbal Permission Granted     Permission granted to share info w AGENCY: Laura Hale        Emotional Assessment       Orientation: : Oriented to Self, Oriented to Place, Oriented to  Time, Oriented to Situation Alcohol / Substance Use: Not Applicable Psych Involvement: No (comment)  Admission diagnosis:  Hx of total hip arthroplasty, left [D74.128] Patient Active Problem List  Diagnosis Date Noted  . Hx of total hip arthroplasty, left 06/21/2020  . COPD with asthma ([place]) 06/18/2020  . Hyperglycemia 06/18/2020  . Hyperlipidemia 06/18/2020  . Urinary incontinence, mixed 06/18/2020  . Pulmonary nodules 06/07/2020  . Avascular necrosis of  femoral head, left ([place]) 04/02/2020  . Primary osteoarthritis of left hip 04/02/2020  . Numbness 08/19/2019  . Ataxia 07/15/2019  . Bilateral carpal tunnel syndrome 07/15/2019  . Obstructive sleep apnea 06/19/2017  . BMI 40.0-44.9, adult ([place]) 04/29/2016  . Primary osteoarthritis of left knee 01/19/2016  . Rotator cuff tendinitis, left 11/20/2015  . Complex tear of lateral meniscus of right knee as current injury 11/20/2015  . Primary osteoarthritis of right knee 11/20/2015  . Essential hypertension 10/19/2015  . Chronic pain of both shoulders 07/20/2015  . 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  . Neuropathy 04/13/2014  . Vitamin D deficiency 10/15/2013   PCP:  Laura Hector, MD Pharmacy:   [facility] [phone] [address], [name] [address] Phone: [phone] Fax: [phone]     Social Determinants of Health (SDOH) Interventions    Readmission Risk Interventions No flowsheet data found.

## 2020-06-22 NOTE — Anesthesia Postprocedure Evaluation (Signed)
Anesthesia Post Note  Patient: Laura Hale  Procedure(s) Performed: TOTAL HIP ARTHROPLASTY (Left Hip)  Patient location during evaluation: PACU Anesthesia Type: Spinal Level of consciousness: awake and alert Pain management: pain level controlled Vital Signs Assessment: post-procedure vital signs reviewed and stable Respiratory status: spontaneous breathing, nonlabored ventilation, respiratory function stable and patient connected to nasal cannula oxygen Cardiovascular status: blood pressure returned to baseline and stable Postop Assessment: no apparent nausea or vomiting Anesthetic complications: no   No complications documented.   Last Vitals:  Vitals:   06/22/20 0005 06/22/20 0353  BP: 118/64 122/61  Pulse: 69 68  Resp: 17 18  Temp: 36.4 C 36.5 C  SpO2: 97% 97%    Last Pain:  Vitals:   06/22/20 0353  TempSrc: Oral  PainSc:                  Molli Barrows

## 2020-06-23 LAB — SURGICAL PATHOLOGY

## 2020-06-23 MED ORDER — OXYCODONE HCL 5 MG PO TABS: 5.0000 mg | ORAL_TABLET | ORAL | 0 refills | Status: DC | PRN

## 2020-06-23 MED ORDER — TRAMADOL HCL 50 MG PO TABS: 50.0000 mg | ORAL_TABLET | ORAL | 0 refills | Status: DC | PRN

## 2020-06-23 MED ORDER — CELECOXIB 200 MG PO CAPS: 200.0000 mg | ORAL_CAPSULE | Freq: Two times a day (BID) | ORAL | 0 refills | Status: DC

## 2020-06-23 MED ORDER — ENOXAPARIN SODIUM 40 MG/0.4ML ~~LOC~~ SOLN: 40.0000 mg | SUBCUTANEOUS | 0 refills | Status: DC

## 2020-06-23 NOTE — Care Management Important Message (Signed)
Important Message  Patient Details  Name: Laura Hale MRN: [id number] Date of Birth: 12/06/1945   Medicare Important Message Given:  N/A - LOS <3 / Initial given by admissions     Juliann Pulse A Montray Kliebert 06/23/2020, 7:42 AM

## 2020-06-23 NOTE — Discharge Summary (Signed)
Physician Discharge Summary  Patient ID: Laura Hale MRN: [id number] DOB/AGE: Jun 20, 1946 74 y.o.  Admit date: 06/21/2020 Discharge date: 06/23/2020  Admission Diagnoses:  Hx of total hip arthroplasty, left [Z96.642]  Surgeries:Procedure(s):  Left total hip arthroplasty  SURGEON:  Marciano Sequin. M.D.  ASSISTANT: Cassell Smiles, PA-C (present and scrubbed throughout the case, critical for assistance with exposure, retraction, instrumentation, and closure)  ANESTHESIA: spinal  ESTIMATED BLOOD LOSS: 200 mL  FLUIDS REPLACED: 1100 mL of crystalloid  DRAINS: 2 medium Hemovac  IMPLANTS UTILIZED: DePuy 15 mm small stature AML femoral stem, 52 mm OD Pinnacle 100 acetabular component, neutral Pinnacle Marathon polyethylene insert, and a 36 mm M-SPEC +1.5 mm hip ball  Discharge Diagnoses: Patient Active Problem List   Diagnosis Date Noted  . Hx of total hip arthroplasty, left 06/21/2020  . COPD with asthma ([place]) 06/18/2020  . Hyperglycemia 06/18/2020  . Hyperlipidemia 06/18/2020  . Urinary incontinence, mixed 06/18/2020  . Pulmonary nodules 06/07/2020  . Avascular necrosis of femoral head, left ([place]) 04/02/2020  . Primary osteoarthritis of left hip 04/02/2020  . Numbness 08/19/2019  . Ataxia 07/15/2019  . Bilateral carpal tunnel syndrome 07/15/2019  . Obstructive sleep apnea 06/19/2017  . BMI 40.0-44.9, adult ([place]) 04/29/2016  . Primary osteoarthritis of left knee 01/19/2016  . Rotator cuff tendinitis, left 11/20/2015  . Complex tear of lateral meniscus of right knee as current injury 11/20/2015  . Primary osteoarthritis of right knee 11/20/2015  . Essential hypertension 10/19/2015  . Chronic pain of both shoulders 07/20/2015  . 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  . Neuropathy 04/13/2014  . Vitamin D deficiency 10/15/2013    Past Medical History:  Diagnosis Date  . Arthritis    elbow, hands, neck   . Asthma    allergy induced asthma-only exacerbated by  smells like perfume, smoke etc  . Atypical childhood psychosis 2007   myoview -negative for ischemia with preserved LV function  . Basal cell carcinoma ~2006   nasal tip  . Colon polyps   . Complication of anesthesia    WITH ACDF IN JUNE 2016-PT STATES SHE LOST HER VOICE X 10 WEEKS DUE TO INTUBATION  . COPD (chronic obstructive pulmonary disease) ([place])   . Difficult intubation    with last procedure.  couldn't talk due to tube  . Edema    LEGS/FEET  . Hypercalcemia 2009   calcium  WNL 10.2 on 04-2015  . Hyperlipidemia   . Obesity   . Urinary incontinence      Transfusion:    Consultants (if any):   Discharged Condition: Improved  Hospital Course: Laura Hale is an 74 y.o. female who was admitted 06/21/2020 with a diagnosis of left hip osteoarthritis and went to the operating room on 06/21/2020 and underwent left total hip arthroplasty through posterior approach. The patient received perioperative antibiotics for prophylaxis (see below). The patient tolerated the procedure well and was transported to PACU in stable condition. After meeting PACU criteria, the patient was subsequently transferred to the Orthopaedics/Rehabilitation unit.   The patient received DVT prophylaxis in the form of early mobilization, Lovenox, Foot Pumps and TED hose. A sacral pad had been placed and heels were elevated off of the bed with rolled towels in order to protect skin integrity. Foley catheter was discontinued on postoperative day #0. Wound drains were discontinued on postoperative day #2. The surgical incision was healing well without signs of infection.  Physical therapy was initiated postoperatively for transfers, gait training, and strengthening. Occupational  therapy was initiated for activities of daily living and evaluation for assisted devices. Rehabilitation goals were reviewed in detail with the patient. The patient made steady progress with physical therapy and physical therapy  recommended discharge to Home.   The patient achieved the preliminary goals of this hospitalization and was felt to be medically and orthopaedically appropriate for discharge.  She was given perioperative antibiotics:  Anti-infectives (From admission, onward)   Start     Dose/Rate Route Frequency Ordered Stop   06/21/20 1400  ceFAZolin (ANCEF) IVPB 2g/100 mL premix        2 g 200 mL/hr over 30 Minutes Intravenous Every 6 hours 06/21/20 1248 06/21/20 2250   06/21/20 0641  ceFAZolin (ANCEF) 2-4 GM/100ML-% IVPB       Note to Pharmacy: Myles Lipps   : cabinet override      06/21/20 0641 06/21/20 0749   06/21/20 0615  ceFAZolin (ANCEF) IVPB 2g/100 mL premix        2 g 200 mL/hr over 30 Minutes Intravenous On call to O.R. 06/21/20 2536 06/21/20 0806    .  Recent vital signs:  Vitals:   06/23/20 0336 06/23/20 0801  BP: 138/61 131/63  Pulse: 94 89  Resp: 17 17  Temp: 98.7 F (37.1 C) 98.4 F (36.9 C)  SpO2: 95% 95%    Recent laboratory studies:  No results for input(s): WBC, HGB, HCT, PLT, K, CL, CO2, BUN, CREATININE, GLUCOSE, CALCIUM, LABPT, INR in the last 72 hours.  Diagnostic Studies: CT Head Wo Contrast  Result Date: 06/06/2020 CLINICAL DATA:  Head trauma EXAM: CT HEAD WITHOUT CONTRAST TECHNIQUE: Contiguous axial images were obtained from the base of the skull through the vertex without intravenous contrast. COMPARISON:  None. FINDINGS: Brain: No evidence of acute territorial infarction, hemorrhage, hydrocephalus,extra-axial collection or mass lesion/mass effect. There is mild dilatation the ventricles and sulci consistent with age-related atrophy. Low-attenuation changes in the deep white matter consistent with small vessel ischemia. Vascular: No hyperdense vessel or unexpected calcification. Skull: The skull is intact. No fracture or focal lesion identified. Sinuses/Orbits: There is complete opacification the left maxillary sinus with ethmoid air cell mucosal thickening. The  orbits and globes intact. Other: None Face: Osseous: No acute fracture or other significant osseous abnormality.The nasal bone, mandibles, zygomatic arches and pterygoid plates are intact. Orbits: No fracture identified. Unremarkable appearance of globes and orbits. Sinuses: There is complete opacification the left maxillary sinus and ethmoid air cell mucosal thickening. Soft tissues:  No acute findings. Limited intracranial: No acute findings. Cervical spine: Alignment: There is a minimal anterolisthesis of C3 on C4 and C4 on C5 measuring 2 mm. Skull base and vertebrae: Visualized skull base is intact. No atlanto-occipital dissociation. There is a prior ACDF at C6-C7. No periprosthetic lucency or fracture is identified. Soft tissues and spinal canal: The visualized paraspinal soft tissues are unremarkable. No prevertebral soft tissue swelling is seen. The spinal canal is grossly unremarkable, no large epidural collection or significant canal narrowing. Disc levels: Multilevel cervical spine spondylosis is seen with disc osteophyte complex and uncovertebral osteophytes most notable at C5-C6 with mild neural foraminal narrowing. Upper chest: Bilateral biapical nodular opacities are noted. Within the left lung apex measures 5 mm within the right lung apex 4 mm. Thoracic inlet is within normal limits. Other: None IMPRESSION: 1. No acute intracranial abnormality. 2. Findings consistent with age related atrophy and chronic small vessel ischemia 3. No acute facial fracture 4. Findings of left maxillary sinusitis. 5. Status post ACDF  at C6-C7.  No acute fracture seen. 6. Bilateral biapical 6 mm pulmonary nodules. would recommend dedicated nonemergent chest CT. Electronically Signed   By: Prudencio Pair M.D.   On: 06/06/2020 21:35   CT CHEST WO CONTRAST  Result Date: 06/13/2020 CLINICAL DATA:  74 year old female status post cervical spine CT for trauma last month revealing small apical lung nodules. Subsequent encounter.  EXAM: CT CHEST WITHOUT CONTRAST TECHNIQUE: Multidetector CT imaging of the chest was performed following the standard protocol without IV contrast. COMPARISON:  Cervical spine CT 06/06/2020. No prior chest CT. Right shoulder CT 08/23/2015. FINDINGS: Cardiovascular: Calcified aortic atherosclerosis. Calcified coronary artery atherosclerosis and/or stents (series 2, image 72). Cardiac size within normal limits. No pericardial effusion. Vascular patency is not evaluated in the absence of IV contrast. Mediastinum/Nodes: Negative. No mediastinal or hilar lymphadenopathy. Lungs/Pleura: 3-4 mm nodule in the posterior right upper lobe on series 3, image 25 redemonstrated. This appears noncalcified, but not significantly changed from the 2017 shoulder CT and therefore benign. Likewise, a tiny anterior upper lobe nodule on image 26 is stable and benign. Multiple additional noncalcified right upper lobe nodules measuring about 4 mm (on series 3, image 20, image 53, image 57). Additionally, a lateral right upper lobe nodule on image 44 and a right middle lobe lateral segment nodule on image 93 both appear calcified and benign (granulomas). Multiple smaller 2-3 mm right middle lobe lung nodules are present, along with 6 mm medial segment right middle lobe nodule on image 80. Similarly, there are multiple small noncalcified right lower lobe lung nodules, and the largest of these is 6 mm on series 3, image 86. Similarly, fairly numerous contralateral left lung noncalcified small lung nodules affecting all lobes. That in the posterior left lung apex on series 3, image 20 measures 4 mm in corresponds to that on the recent cervical spine CT. None of the left lung was visible on the 2017 shoulder CT. The largest left lung nodule is subpleural abutting the mediastinum in the upper lobe on image 45 measuring 6 mm. Upper Abdomen: Negative visible noncontrast liver, gallbladder, spleen, pancreas, adrenal glands and kidneys. Negative  visible bowel in the upper abdomen. Musculoskeletal: Partially visible lower cervical spine fusion and/or disc arthroplasty changes with streak artifact. Degenerative endplate spurring in the thoracic spine. Degenerative changes at both humeral heads. No acute or suspicious osseous lesion identified. IMPRESSION: 1. Fairly numerous small bilateral pulmonary nodules. Several are calcified and benign (granulomas) while two in the right upper lobe were visible on a 2017 right shoulder CT and are also therefore benign. The remaining nodules are also most likely postinflammatory and benign, measuring up to 6 mm in both lungs. Non-contrast chest CT in 6 months is recommended. If the nodules are stable at time of repeat CT, then future CT at 18-24 months (from today's scan) is considered optional for low-risk patients, but is recommended for high-risk patients. This recommendation follows the consensus statement: Guidelines for Management of Incidental Pulmonary Nodules Detected on CT Images: From the Fleischner Society 2017; Radiology 2017; 284:228-243. 2. Calcified coronary artery and Aortic atherosclerosis (ICD10-I70.0). Electronically Signed   By: Genevie Ann M.D.   On: 06/13/2020 11:01   CT Cervical Spine Wo Contrast  Result Date: 06/06/2020 CLINICAL DATA:  Head trauma EXAM: CT HEAD WITHOUT CONTRAST TECHNIQUE: Contiguous axial images were obtained from the base of the skull through the vertex without intravenous contrast. COMPARISON:  None. FINDINGS: Brain: No evidence of acute territorial infarction, hemorrhage, hydrocephalus,extra-axial collection or mass  lesion/mass effect. There is mild dilatation the ventricles and sulci consistent with age-related atrophy. Low-attenuation changes in the deep white matter consistent with small vessel ischemia. Vascular: No hyperdense vessel or unexpected calcification. Skull: The skull is intact. No fracture or focal lesion identified. Sinuses/Orbits: There is complete  opacification the left maxillary sinus with ethmoid air cell mucosal thickening. The orbits and globes intact. Other: None Face: Osseous: No acute fracture or other significant osseous abnormality.The nasal bone, mandibles, zygomatic arches and pterygoid plates are intact. Orbits: No fracture identified. Unremarkable appearance of globes and orbits. Sinuses: There is complete opacification the left maxillary sinus and ethmoid air cell mucosal thickening. Soft tissues:  No acute findings. Limited intracranial: No acute findings. Cervical spine: Alignment: There is a minimal anterolisthesis of C3 on C4 and C4 on C5 measuring 2 mm. Skull base and vertebrae: Visualized skull base is intact. No atlanto-occipital dissociation. There is a prior ACDF at C6-C7. No periprosthetic lucency or fracture is identified. Soft tissues and spinal canal: The visualized paraspinal soft tissues are unremarkable. No prevertebral soft tissue swelling is seen. The spinal canal is grossly unremarkable, no large epidural collection or significant canal narrowing. Disc levels: Multilevel cervical spine spondylosis is seen with disc osteophyte complex and uncovertebral osteophytes most notable at C5-C6 with mild neural foraminal narrowing. Upper chest: Bilateral biapical nodular opacities are noted. Within the left lung apex measures 5 mm within the right lung apex 4 mm. Thoracic inlet is within normal limits. Other: None IMPRESSION: 1. No acute intracranial abnormality. 2. Findings consistent with age related atrophy and chronic small vessel ischemia 3. No acute facial fracture 4. Findings of left maxillary sinusitis. 5. Status post ACDF at C6-C7.  No acute fracture seen. 6. Bilateral biapical 6 mm pulmonary nodules. would recommend dedicated nonemergent chest CT. Electronically Signed   By: Prudencio Pair M.D.   On: 06/06/2020 21:35   DG Knee Complete 4 Views Left  Result Date: 06/06/2020 CLINICAL DATA:  Left knee pain after fall anterior  laceration and pain EXAM: LEFT KNEE - COMPLETE 4+ VIEW COMPARISON:  Left knee MRI 01/16/2016 FINDINGS: Soft tissue swelling and thickening anterior to the patella likely corresponding to some contusive changes reported laceration. No soft tissue gas or foreign body is seen. No sizeable joint effusion. No acute bony abnormality. Specifically, no fracture, subluxation, or dislocation. Mild-to-moderate tricompartmental degenerative changes are present most pronounced in the patellofemoral compartment with joint space narrowing and periarticular spurring. Some mineralization in the posterior soft tissues likely reflecting atherosclerotic calcification. IMPRESSION: 1. Soft tissue swelling and thickening anterior to the patella likely corresponding to some contusive changes and reported laceration. 2. No soft tissue gas or foreign body. 3. No acute osseous abnormality. 4. Mild-to-moderate tricompartmental degenerative changes. Electronically Signed   By: Lovena Le M.D.   On: 06/06/2020 23:29   MM 3D SCREEN BREAST BILATERAL  Result Date: 06/07/2020 CLINICAL DATA:  Screening. EXAM: DIGITAL SCREENING BILATERAL MAMMOGRAM WITH TOMO AND CAD COMPARISON:  Previous exam(s). ACR Breast Density Category b: There are scattered areas of fibroglandular density. FINDINGS: There are no findings suspicious for malignancy. Images were processed with CAD. IMPRESSION: No mammographic evidence of malignancy. A result letter of this screening mammogram will be mailed directly to the patient. RECOMMENDATION: Screening mammogram in one year. (Code:[id number]) BI-RADS CATEGORY  1: Negative. Electronically Signed   By: Valentino Saxon MD   On: 06/07/2020 13:09   DG Hip Port Unilat With Pelvis 1V Left  Result Date: 06/21/2020 CLINICAL DATA:  Left total hip replacement. EXAM: DG HIP (WITH OR WITHOUT PELVIS) 1V PORT LEFT COMPARISON:  MRI left hip dated March 22, 2020. FINDINGS: The left hip demonstrates a total arthroplasty without  evidence of hardware failure or complication. There is no fracture or dislocation. The alignment is anatomic. Surgical drains are in place. Post-surgical changes noted in the surrounding soft tissues. IMPRESSION: 1. Left total hip arthroplasty without acute postoperative complication. Electronically Signed   By: Titus Dubin M.D.   On: 06/21/2020 12:15   CT Maxillofacial Wo Contrast  Result Date: 06/06/2020 CLINICAL DATA:  Head trauma EXAM: CT HEAD WITHOUT CONTRAST TECHNIQUE: Contiguous axial images were obtained from the base of the skull through the vertex without intravenous contrast. COMPARISON:  None. FINDINGS: Brain: No evidence of acute territorial infarction, hemorrhage, hydrocephalus,extra-axial collection or mass lesion/mass effect. There is mild dilatation the ventricles and sulci consistent with age-related atrophy. Low-attenuation changes in the deep white matter consistent with small vessel ischemia. Vascular: No hyperdense vessel or unexpected calcification. Skull: The skull is intact. No fracture or focal lesion identified. Sinuses/Orbits: There is complete opacification the left maxillary sinus with ethmoid air cell mucosal thickening. The orbits and globes intact. Other: None Face: Osseous: No acute fracture or other significant osseous abnormality.The nasal bone, mandibles, zygomatic arches and pterygoid plates are intact. Orbits: No fracture identified. Unremarkable appearance of globes and orbits. Sinuses: There is complete opacification the left maxillary sinus and ethmoid air cell mucosal thickening. Soft tissues:  No acute findings. Limited intracranial: No acute findings. Cervical spine: Alignment: There is a minimal anterolisthesis of C3 on C4 and C4 on C5 measuring 2 mm. Skull base and vertebrae: Visualized skull base is intact. No atlanto-occipital dissociation. There is a prior ACDF at C6-C7. No periprosthetic lucency or fracture is identified. Soft tissues and spinal canal: The  visualized paraspinal soft tissues are unremarkable. No prevertebral soft tissue swelling is seen. The spinal canal is grossly unremarkable, no large epidural collection or significant canal narrowing. Disc levels: Multilevel cervical spine spondylosis is seen with disc osteophyte complex and uncovertebral osteophytes most notable at C5-C6 with mild neural foraminal narrowing. Upper chest: Bilateral biapical nodular opacities are noted. Within the left lung apex measures 5 mm within the right lung apex 4 mm. Thoracic inlet is within normal limits. Other: None IMPRESSION: 1. No acute intracranial abnormality. 2. Findings consistent with age related atrophy and chronic small vessel ischemia 3. No acute facial fracture 4. Findings of left maxillary sinusitis. 5. Status post ACDF at C6-C7.  No acute fracture seen. 6. Bilateral biapical 6 mm pulmonary nodules. would recommend dedicated nonemergent chest CT. Electronically Signed   By: Prudencio Pair M.D.   On: 06/06/2020 21:35    Discharge Medications:   Allergies as of 06/23/2020   No Known Allergies     Medication List    STOP taking these medications   naproxen 500 MG tablet Commonly known as: NAPROSYN     TAKE these medications   acetaminophen 500 MG tablet Commonly known as: TYLENOL Take 1,000 mg by mouth every 6 (six) hours as needed for moderate pain.   atorvastatin 80 MG tablet Commonly known as: LIPITOR Take 80 mg by mouth daily.   celecoxib 200 MG capsule Commonly known as: CELEBREX Take 1 capsule (200 mg total) by mouth 2 (two) times daily.   cetirizine 10 MG tablet Commonly known as: ZYRTEC Take 10 mg by mouth daily.   diphenhydramine-acetaminophen 25-500 MG Tabs tablet Commonly known as: TYLENOL PM Take  1 tablet by mouth at bedtime as needed (sleep).   enoxaparin 40 MG/0.4ML injection Commonly known as: LOVENOX Inject 0.4 mLs (40 mg total) into the skin daily for 14 days.   hydrochlorothiazide 25 MG tablet Commonly  known as: HYDRODIURIL Take 25 mg by mouth daily.   multivitamin tablet Take 1 tablet by mouth daily.   oxyCODONE 5 MG immediate release tablet Commonly known as: Oxy IR/ROXICODONE Take 1 tablet (5 mg total) by mouth every 4 (four) hours as needed for moderate pain (pain score 4-6).   traMADol 50 MG tablet Commonly known as: ULTRAM Take 1 tablet (50 mg total) by mouth every 4 (four) hours as needed for moderate pain.            Durable Medical Equipment  (From admission, onward)         Start     Ordered   06/21/20 1249  DME Walker rolling  Once       Question:  Patient needs a walker to treat with the following condition  Answer:  S/P total hip arthroplasty   06/21/20 1248   06/21/20 1249  DME Bedside commode  Once       Question:  Patient needs a bedside commode to treat with the following condition  Answer:  S/P total hip arthroplasty   06/21/20 1248          Disposition: home with home health PT and OT     Follow-up Information    Dereck Leep, MD On 08/03/2020.   Specialty: Orthopedic Surgery Why: at 9:00am Contact information: [name] [address] [place], PA-C 06/23/2020, 10:40 AM

## 2020-06-23 NOTE — Progress Notes (Signed)
Physical Therapy Treatment Patient Details Name: Laura Hale MRN: [id number] DOB: 1946/05/11 Today's Date: 06/23/2020    History of Present Illness 74 y/o female s/p L total hip replacement (posterior approach) 11/10. PMH includes: Morbid obesity, Hyperlipidemia, and COPD.    PT Comments    Pt was sitting in recliner upon arriving. She is agreeable to session and cooperative and pleasant throughout. Reports 3/10 pain at rest that elevated to 4/10 with wt bearing. She was easily able to stand and ambulate with RW without assistance. Was able to adhere to hip precautions throughout. Safely performed stair navigation with supervision only. Reviewed HEP and car transfers. Pt correctly able to state safe performance and is confident in DC to home. Will continue to benefit from HHPT at DC to continue to progress strength, gait, and safe functional mobility. She was sitting in recliner post session with call bell in reach and ice pack applied. Call bell in reach and RN aware of pt's request for pain meds. Pt has cleared all acute PT goals.     Follow Up Recommendations  Home health PT     Equipment Recommendations  None recommended by PT (Pt has received all needed equipment)    Recommendations for Other Services       Precautions / Restrictions Precautions Precautions: Posterior Hip;Fall Precaution Booklet Issued: Yes (comment) Restrictions Weight Bearing Restrictions: Yes LLE Weight Bearing: Weight bearing as tolerated    Mobility  Bed Mobility  General bed mobility comments: Pt was sitting in recliner pre/post session  Transfers Overall transfer level: Modified independent Equipment used: Rolling walker (2 wheeled) Transfers: Sit to/from Stand Sit to Stand: Modified independent (Device/Increase time)         General transfer comment: no physical assistance or VCs. does well to adhere to precautions  Ambulation/Gait Ambulation/Gait assistance: Supervision;Min  guard Gait Distance (Feet): 160 Feet Assistive device: Rolling walker (2 wheeled) Gait Pattern/deviations: Antalgic;Step-to pattern;Step-through pattern Gait velocity: decreased   General Gait Details: pt was able to progress from step to pattern too step through gait pattern   Stairs Stairs: Yes Stairs assistance: Supervision Stair Management: Two rails;No rails;Step to pattern;With walker Number of Stairs: 4 General stair comments: Pt was able to perform simulated home entry without difficulty. performed ascending/descending stairs with BUE support on rails and perform 1 step without rails + RW. She demonstrates safe ability to perform all mobility      Balance Overall balance assessment: Modified Independent       Cognition Arousal/Alertness: Awake/alert Behavior During Therapy: WFL for tasks assessed/performed Overall Cognitive Status: Within Functional Limits for tasks assessed              Pertinent Vitals/Pain Pain Assessment: 0-10 Pain Score: 4  Pain Location: L hip/incision site Pain Descriptors / Indicators: Dull;Aching Pain Intervention(s): Limited activity within patient's tolerance;Monitored during session;Repositioned;Ice applied           PT Goals (current goals can now be found in the care plan section) Acute Rehab PT Goals Patient Stated Goal: go home Progress towards PT goals: Progressing toward goals    Frequency    BID      PT Plan Current plan remains appropriate       AM-PAC PT "6 Clicks" Mobility   Outcome Measure  Help needed turning from your back to your side while in a flat bed without using bedrails?: None Help needed moving from lying on your back to sitting on the side of a flat bed without using bedrails?:  None Help needed moving to and from a bed to a chair (including a wheelchair)?: A Little Help needed standing up from a chair using your arms (e.g., wheelchair or bedside chair)?: A Little Help needed to walk in  hospital room?: A Little Help needed climbing 3-5 steps with a railing? : A Little 6 Click Score: 20    End of Session Equipment Utilized During Treatment: Gait belt Activity Tolerance: Patient tolerated treatment well Patient left: in chair;with call bell/phone within reach;with chair alarm set;with nursing/sitter in room Nurse Communication: Mobility status PT Visit Diagnosis: Muscle weakness (generalized) (M62.81);Difficulty in walking, not elsewhere classified (R26.2);Pain Pain - Right/Left: Left Pain - part of body: Hip     Time: 1470-9295 PT Time Calculation (min) (ACUTE ONLY): 38 min  Charges:  $Gait Training: 23-37 mins $Therapeutic Exercise: 8-22 mins                     Julaine Fusi PTA 06/23/20, 9:12 AM

## 2020-06-23 NOTE — Progress Notes (Signed)
Occupational Therapy Treatment Patient Details Name: Laura Hale MRN: [id number] DOB: 1946-05-03 Today's Date: 06/23/2020    History of present illness 74 y/o female s/p L total hip replacement (posterior approach) 11/10. PMH includes: Morbid obesity, Hyperlipidemia, and COPD.   OT comments  Pt seen for OT tx this date to f/u re: safety with ADLs/ADL mobility. OT facilitates ed re: THPs with pt able to recall 3/3 this date demonstrating good carryover from yesterday's session. In addition, OT faciltiates pt participation in re-ed re: AE for LB ADLs, pt able to perform with MIN A. OT engages pt in fxl mobility to restroom with MOD I with RW and pt able to t/f to commode and perform toileting tasks with MOD I as well, demos good control and good safety awareness for hip precautions. Tolerates ~10 min stand for grooming tasks. Pt feels she needs to sit on commode longer to have BM and is left with call cord in reach, RN and CNA notified. Will continue to follow. Anticipate pt could benefit from [facility] upon d/c.   Follow Up Recommendations  Home health OT    Equipment Recommendations  3 in 1 bedside commode;Other (comment)    Recommendations for Other Services      Precautions / Restrictions Precautions Precautions: Posterior Hip;Fall Restrictions Weight Bearing Restrictions: Yes LLE Weight Bearing: Weight bearing as tolerated       Mobility Bed Mobility               General bed mobility comments: pt up to chair pre/post session  Transfers Overall transfer level: Modified independent Equipment used: Rolling walker (2 wheeled) Transfers: Sit to/from Stand Sit to Stand: Modified independent (Device/Increase time)         General transfer comment: no physical assistance, demos good carryover of ed re: safe technique to transfer with adherance to hip precautions    Balance Overall balance assessment: Modified Independent                                          ADL either performed or assessed with clinical judgement   ADL                                         General ADL Comments: MIN A with LB dressing with AE, MOD I with RW for standing UB ADLs sink-side tolerating ~10 mins standing. MOD I for commode transfer to elevated seat and MOD I for peri care with use of grab bar to stabilize     Vision Baseline Vision/History: Wears glasses Wears Glasses: At all times Patient Visual Report: No change from baseline     Perception     Praxis      Cognition Arousal/Alertness: Awake/alert Behavior During Therapy: WFL for tasks assessed/performed Overall Cognitive Status: Within Functional Limits for tasks assessed                                          Exercises Other Exercises Other Exercises: OT facilitates ed re: THPs with pt able to recall 3/3. In addition, OT faciltiates pt participation in re-ed re: AE for LB ADLs, pt able to perform with MIN A. OT engages pt in  fxl mobility to restroom with MOD I with RW and pt able to t/f to commode and perform toileting tasks with MOD I as well, demos good control and good safety awareness for hip precautions. Tolerates ~10 min stand for grooming tasks.   Shoulder Instructions       General Comments      Pertinent Vitals/ Pain       Pain Assessment: 0-10 Pain Score: 2  Pain Location: L hip/incision site Pain Descriptors / Indicators: Dull;Aching Pain Intervention(s): Monitored during session  Home Living                                          Prior Functioning/Environment              Frequency  Min 1X/week        Progress Toward Goals  OT Goals(current goals can now be found in the care plan section)  Progress towards OT goals: Progressing toward goals  Acute Rehab OT Goals Patient Stated Goal: go home OT Goal Formulation: With patient Time For Goal Achievement: 07/06/20 Potential to Achieve Goals:  Good  Plan      Co-evaluation                 AM-PAC OT "6 Clicks" Daily Activity     Outcome Measure   Help from another person eating meals?: None Help from another person taking care of personal grooming?: A Little Help from another person toileting, which includes using toliet, bedpan, or urinal?: A Little Help from another person bathing (including washing, rinsing, drying)?: A Little Help from another person to put on and taking off regular upper body clothing?: None Help from another person to put on and taking off regular lower body clothing?: A Little 6 Click Score: 20    End of Session Equipment Utilized During Treatment: Gait belt;Rolling walker  OT Visit Diagnosis: Unsteadiness on feet (R26.81)   Activity Tolerance Patient tolerated treatment well   Patient Left in chair;with call bell/phone within reach   Nurse Communication Mobility status        Time: 1126-1150 OT Time Calculation (min): 24 min  Charges: OT General Charges $OT Visit: 1 Visit OT Treatments $Self Care/Home Management : 8-22 mins $Therapeutic Activity: 8-22 mins  Gerrianne Scale, [place], OTR/L ascom [phone] 06/23/20, 2:07 PM

## 2020-06-23 NOTE — Progress Notes (Signed)
Patient 10is stable and ready for discharge home with H/H services for therapy. Patient's IV removed and patient dressed by NT. Writer went over discharge paperwork with patient and husband and educated on constipation and what patient can do at home to prevent, also demonstrated how to give the Lovenox injections both verbalized understanding and had no further questions. Patient's husband packed patient's belongings. Patient was wheeled by NT to patient's private car with husband to go home.

## 2020-06-23 NOTE — Progress Notes (Signed)
  Subjective: 2 Days Post-Op Procedure(s) (LRB): TOTAL HIP ARTHROPLASTY (Left) Patient reports pain as well-controlled.   Patient is well, and has had no acute complaints or problems Plan is to go Home after hospital stay. Negative for chest pain and shortness of breath Fever: no Gastrointestinal: negative for nausea and vomiting.  Patient has not had a bowel movement.  Objective: Vital signs in last 24 hours: Temp:  [98.2 F (36.8 C)-98.9 F (37.2 C)] 98.4 F (36.9 C) (11/12 0801) Pulse Rate:  [72-94] 89 (11/12 0801) Resp:  [17-18] 17 (11/12 0801) BP: (118-151)/(52-72) 131/63 (11/12 0801) SpO2:  [95 %-98 %] 95 % (11/12 0801)  Intake/Output from previous day:  Intake/Output Summary (Last 24 hours) at 06/23/2020 0804 Last data filed at 06/23/2020 3154 Gross per 24 hour  Intake 245.66 ml  Output 85 ml  Net 160.66 ml    Intake/Output this shift: No intake/output data recorded.  Labs: No results for input(s): HGB in the last 72 hours. No results for input(s): WBC, RBC, HCT, PLT in the last 72 hours. No results for input(s): NA, K, CL, CO2, BUN, CREATININE, GLUCOSE, CALCIUM in the last 72 hours. No results for input(s): LABPT, INR in the last 72 hours.   EXAM General - Patient is Alert, Appropriate and Oriented Extremity - Neurovascular intact Dorsiflexion/Plantar flexion intact Compartment soft Dressing/Incision -Postoperative dressing remains in place., Hemovac in place. Mild sanguinous drainage noted Motor Function - intact, moving foot and toes well on exam.  Cardiovascular- Regular rate and rhythm, no murmurs/rubs/gallops Respiratory- Lungs clear to auscultation bilaterally Gastrointestinal- soft and hypoactive bowel sounds   Assessment/Plan: 2 Days Post-Op Procedure(s) (LRB): TOTAL HIP ARTHROPLASTY (Left) Active Problems:   Hx of total hip arthroplasty, left  Estimated body mass index is 40.83 kg/m as calculated from the following:   Height as of this  encounter: 5' 5.5" (1.664 m).   Weight as of this encounter: 113 kg. Advance diet Up with therapy Discharge home with home health pending completion of PT goals and BM.  Honeycomb dressing changed. Hemovac removed. Mini compression dressing applied.  DVT Prophylaxis - Lovenox, Ted hose and foot pumps Weight-Bearing as tolerated to left leg  Cassell Smiles, PA-C [facility] 06/23/2020, 8:04 AM

## 2020-06-24 DIAGNOSIS — Z89011 Acquired absence of right thumb: Secondary | ICD-10-CM | POA: Diagnosis not present

## 2020-06-24 DIAGNOSIS — Z96642 Presence of left artificial hip joint: Secondary | ICD-10-CM | POA: Diagnosis not present

## 2020-06-24 DIAGNOSIS — Z6839 Body mass index (BMI) 39.0-39.9, adult: Secondary | ICD-10-CM | POA: Diagnosis not present

## 2020-06-24 DIAGNOSIS — N3946 Mixed incontinence: Secondary | ICD-10-CM | POA: Diagnosis not present

## 2020-06-24 DIAGNOSIS — Z471 Aftercare following joint replacement surgery: Secondary | ICD-10-CM | POA: Diagnosis not present

## 2020-06-24 DIAGNOSIS — Z85828 Personal history of other malignant neoplasm of skin: Secondary | ICD-10-CM | POA: Diagnosis not present

## 2020-06-24 DIAGNOSIS — Z90710 Acquired absence of both cervix and uterus: Secondary | ICD-10-CM | POA: Diagnosis not present

## 2020-06-24 DIAGNOSIS — Z9181 History of falling: Secondary | ICD-10-CM | POA: Diagnosis not present

## 2020-06-24 DIAGNOSIS — Z87891 Personal history of nicotine dependence: Secondary | ICD-10-CM | POA: Diagnosis not present

## 2020-06-24 DIAGNOSIS — J449 Chronic obstructive pulmonary disease, unspecified: Secondary | ICD-10-CM | POA: Diagnosis not present

## 2020-06-24 DIAGNOSIS — G4733 Obstructive sleep apnea (adult) (pediatric): Secondary | ICD-10-CM | POA: Diagnosis not present

## 2020-06-24 DIAGNOSIS — E785 Hyperlipidemia, unspecified: Secondary | ICD-10-CM | POA: Diagnosis not present

## 2020-06-24 DIAGNOSIS — Z86010 Personal history of colonic polyps: Secondary | ICD-10-CM | POA: Diagnosis not present

## 2020-06-24 DIAGNOSIS — R739 Hyperglycemia, unspecified: Secondary | ICD-10-CM | POA: Diagnosis not present

## 2020-06-24 DIAGNOSIS — Z791 Long term (current) use of non-steroidal anti-inflammatories (NSAID): Secondary | ICD-10-CM | POA: Diagnosis not present

## 2020-06-24 DIAGNOSIS — E669 Obesity, unspecified: Secondary | ICD-10-CM | POA: Diagnosis not present

## 2020-06-24 DIAGNOSIS — Z7901 Long term (current) use of anticoagulants: Secondary | ICD-10-CM | POA: Diagnosis not present

## 2020-06-28 DIAGNOSIS — Z471 Aftercare following joint replacement surgery: Secondary | ICD-10-CM | POA: Diagnosis not present

## 2020-06-28 DIAGNOSIS — Z86010 Personal history of colonic polyps: Secondary | ICD-10-CM | POA: Diagnosis not present

## 2020-06-28 DIAGNOSIS — Z87891 Personal history of nicotine dependence: Secondary | ICD-10-CM | POA: Diagnosis not present

## 2020-06-28 DIAGNOSIS — Z89011 Acquired absence of right thumb: Secondary | ICD-10-CM | POA: Diagnosis not present

## 2020-06-28 DIAGNOSIS — N3946 Mixed incontinence: Secondary | ICD-10-CM | POA: Diagnosis not present

## 2020-06-28 DIAGNOSIS — J449 Chronic obstructive pulmonary disease, unspecified: Secondary | ICD-10-CM | POA: Diagnosis not present

## 2020-06-28 DIAGNOSIS — Z96642 Presence of left artificial hip joint: Secondary | ICD-10-CM | POA: Diagnosis not present

## 2020-06-28 DIAGNOSIS — Z6839 Body mass index (BMI) 39.0-39.9, adult: Secondary | ICD-10-CM | POA: Diagnosis not present

## 2020-06-28 DIAGNOSIS — Z9181 History of falling: Secondary | ICD-10-CM | POA: Diagnosis not present

## 2020-06-28 DIAGNOSIS — G4733 Obstructive sleep apnea (adult) (pediatric): Secondary | ICD-10-CM | POA: Diagnosis not present

## 2020-06-28 DIAGNOSIS — Z85828 Personal history of other malignant neoplasm of skin: Secondary | ICD-10-CM | POA: Diagnosis not present

## 2020-06-28 DIAGNOSIS — E785 Hyperlipidemia, unspecified: Secondary | ICD-10-CM | POA: Diagnosis not present

## 2020-06-28 DIAGNOSIS — Z7901 Long term (current) use of anticoagulants: Secondary | ICD-10-CM | POA: Diagnosis not present

## 2020-06-28 DIAGNOSIS — Z791 Long term (current) use of non-steroidal anti-inflammatories (NSAID): Secondary | ICD-10-CM | POA: Diagnosis not present

## 2020-06-28 DIAGNOSIS — Z90710 Acquired absence of both cervix and uterus: Secondary | ICD-10-CM | POA: Diagnosis not present

## 2020-06-28 DIAGNOSIS — R739 Hyperglycemia, unspecified: Secondary | ICD-10-CM | POA: Diagnosis not present

## 2020-06-28 DIAGNOSIS — E669 Obesity, unspecified: Secondary | ICD-10-CM | POA: Diagnosis not present

## 2020-07-12 DIAGNOSIS — Z471 Aftercare following joint replacement surgery: Secondary | ICD-10-CM | POA: Diagnosis not present

## 2020-07-13 DIAGNOSIS — Z87891 Personal history of nicotine dependence: Secondary | ICD-10-CM | POA: Diagnosis not present

## 2020-07-13 DIAGNOSIS — Z86010 Personal history of colonic polyps: Secondary | ICD-10-CM | POA: Diagnosis not present

## 2020-07-13 DIAGNOSIS — N3946 Mixed incontinence: Secondary | ICD-10-CM | POA: Diagnosis not present

## 2020-07-13 DIAGNOSIS — E669 Obesity, unspecified: Secondary | ICD-10-CM | POA: Diagnosis not present

## 2020-07-13 DIAGNOSIS — Z6839 Body mass index (BMI) 39.0-39.9, adult: Secondary | ICD-10-CM | POA: Diagnosis not present

## 2020-07-13 DIAGNOSIS — Z7901 Long term (current) use of anticoagulants: Secondary | ICD-10-CM | POA: Diagnosis not present

## 2020-07-13 DIAGNOSIS — Z471 Aftercare following joint replacement surgery: Secondary | ICD-10-CM | POA: Diagnosis not present

## 2020-07-13 DIAGNOSIS — Z96642 Presence of left artificial hip joint: Secondary | ICD-10-CM | POA: Diagnosis not present

## 2020-07-13 DIAGNOSIS — Z85828 Personal history of other malignant neoplasm of skin: Secondary | ICD-10-CM | POA: Diagnosis not present

## 2020-07-13 DIAGNOSIS — Z791 Long term (current) use of non-steroidal anti-inflammatories (NSAID): Secondary | ICD-10-CM | POA: Diagnosis not present

## 2020-07-13 DIAGNOSIS — E785 Hyperlipidemia, unspecified: Secondary | ICD-10-CM | POA: Diagnosis not present

## 2020-07-13 DIAGNOSIS — J449 Chronic obstructive pulmonary disease, unspecified: Secondary | ICD-10-CM | POA: Diagnosis not present

## 2020-07-13 DIAGNOSIS — G4733 Obstructive sleep apnea (adult) (pediatric): Secondary | ICD-10-CM | POA: Diagnosis not present

## 2020-07-13 DIAGNOSIS — Z9181 History of falling: Secondary | ICD-10-CM | POA: Diagnosis not present

## 2020-07-13 DIAGNOSIS — Z89011 Acquired absence of right thumb: Secondary | ICD-10-CM | POA: Diagnosis not present

## 2020-07-13 DIAGNOSIS — Z90710 Acquired absence of both cervix and uterus: Secondary | ICD-10-CM | POA: Diagnosis not present

## 2020-07-13 DIAGNOSIS — R739 Hyperglycemia, unspecified: Secondary | ICD-10-CM | POA: Diagnosis not present

## 2020-07-31 DIAGNOSIS — I10 Essential (primary) hypertension: Secondary | ICD-10-CM | POA: Diagnosis not present

## 2020-07-31 DIAGNOSIS — M5020 Other cervical disc displacement, unspecified cervical region: Secondary | ICD-10-CM | POA: Diagnosis not present

## 2020-07-31 DIAGNOSIS — R739 Hyperglycemia, unspecified: Secondary | ICD-10-CM | POA: Diagnosis not present

## 2020-07-31 DIAGNOSIS — E7849 Other hyperlipidemia: Secondary | ICD-10-CM | POA: Diagnosis not present

## 2020-07-31 DIAGNOSIS — J449 Chronic obstructive pulmonary disease, unspecified: Secondary | ICD-10-CM | POA: Diagnosis not present

## 2020-07-31 DIAGNOSIS — E559 Vitamin D deficiency, unspecified: Secondary | ICD-10-CM | POA: Diagnosis not present

## 2020-07-31 DIAGNOSIS — G4733 Obstructive sleep apnea (adult) (pediatric): Secondary | ICD-10-CM | POA: Diagnosis not present

## 2020-08-03 DIAGNOSIS — Z96642 Presence of left artificial hip joint: Secondary | ICD-10-CM | POA: Diagnosis not present

## 2020-08-07 DIAGNOSIS — G4733 Obstructive sleep apnea (adult) (pediatric): Secondary | ICD-10-CM | POA: Diagnosis not present

## 2020-08-07 DIAGNOSIS — E7849 Other hyperlipidemia: Secondary | ICD-10-CM | POA: Diagnosis not present

## 2020-08-07 DIAGNOSIS — G629 Polyneuropathy, unspecified: Secondary | ICD-10-CM | POA: Diagnosis not present

## 2020-08-07 DIAGNOSIS — Z6841 Body Mass Index (BMI) 40.0 and over, adult: Secondary | ICD-10-CM | POA: Diagnosis not present

## 2020-08-07 DIAGNOSIS — M5020 Other cervical disc displacement, unspecified cervical region: Secondary | ICD-10-CM | POA: Diagnosis not present

## 2020-08-07 DIAGNOSIS — E559 Vitamin D deficiency, unspecified: Secondary | ICD-10-CM | POA: Diagnosis not present

## 2020-08-07 DIAGNOSIS — Z0000 Encounter for general adult medical examination without abnormal findings: Secondary | ICD-10-CM | POA: Diagnosis not present

## 2020-08-07 DIAGNOSIS — R270 Ataxia, unspecified: Secondary | ICD-10-CM | POA: Diagnosis not present

## 2020-08-07 DIAGNOSIS — I10 Essential (primary) hypertension: Secondary | ICD-10-CM | POA: Diagnosis not present

## 2020-08-07 DIAGNOSIS — R739 Hyperglycemia, unspecified: Secondary | ICD-10-CM | POA: Diagnosis not present

## 2020-08-07 DIAGNOSIS — J449 Chronic obstructive pulmonary disease, unspecified: Secondary | ICD-10-CM | POA: Diagnosis not present

## 2020-08-07 DIAGNOSIS — Z124 Encounter for screening for malignant neoplasm of cervix: Secondary | ICD-10-CM | POA: Diagnosis not present

## 2020-09-20 DIAGNOSIS — Z124 Encounter for screening for malignant neoplasm of cervix: Secondary | ICD-10-CM | POA: Diagnosis not present

## 2021-01-12 DIAGNOSIS — R739 Hyperglycemia, unspecified: Secondary | ICD-10-CM | POA: Diagnosis not present

## 2021-01-12 DIAGNOSIS — L03317 Cellulitis of buttock: Secondary | ICD-10-CM | POA: Diagnosis not present

## 2021-01-12 DIAGNOSIS — B029 Zoster without complications: Secondary | ICD-10-CM | POA: Diagnosis not present

## 2021-01-12 DIAGNOSIS — I10 Essential (primary) hypertension: Secondary | ICD-10-CM | POA: Diagnosis not present

## 2021-01-12 DIAGNOSIS — R21 Rash and other nonspecific skin eruption: Secondary | ICD-10-CM | POA: Diagnosis not present

## 2021-01-29 DIAGNOSIS — R739 Hyperglycemia, unspecified: Secondary | ICD-10-CM | POA: Diagnosis not present

## 2021-01-29 DIAGNOSIS — M5020 Other cervical disc displacement, unspecified cervical region: Secondary | ICD-10-CM | POA: Diagnosis not present

## 2021-01-29 DIAGNOSIS — E7849 Other hyperlipidemia: Secondary | ICD-10-CM | POA: Diagnosis not present

## 2021-01-29 DIAGNOSIS — G629 Polyneuropathy, unspecified: Secondary | ICD-10-CM | POA: Diagnosis not present

## 2021-02-05 DIAGNOSIS — E7849 Other hyperlipidemia: Secondary | ICD-10-CM | POA: Diagnosis not present

## 2021-02-05 DIAGNOSIS — Z6841 Body Mass Index (BMI) 40.0 and over, adult: Secondary | ICD-10-CM | POA: Diagnosis not present

## 2021-02-05 DIAGNOSIS — R918 Other nonspecific abnormal finding of lung field: Secondary | ICD-10-CM | POA: Diagnosis not present

## 2021-02-05 DIAGNOSIS — E559 Vitamin D deficiency, unspecified: Secondary | ICD-10-CM | POA: Diagnosis not present

## 2021-02-05 DIAGNOSIS — G4733 Obstructive sleep apnea (adult) (pediatric): Secondary | ICD-10-CM | POA: Diagnosis not present

## 2021-02-05 DIAGNOSIS — R739 Hyperglycemia, unspecified: Secondary | ICD-10-CM | POA: Diagnosis not present

## 2021-02-05 DIAGNOSIS — I10 Essential (primary) hypertension: Secondary | ICD-10-CM | POA: Diagnosis not present

## 2021-02-05 DIAGNOSIS — M5020 Other cervical disc displacement, unspecified cervical region: Secondary | ICD-10-CM | POA: Diagnosis not present

## 2021-02-05 DIAGNOSIS — R270 Ataxia, unspecified: Secondary | ICD-10-CM | POA: Diagnosis not present

## 2021-02-05 DIAGNOSIS — G629 Polyneuropathy, unspecified: Secondary | ICD-10-CM | POA: Diagnosis not present

## 2021-02-05 DIAGNOSIS — J449 Chronic obstructive pulmonary disease, unspecified: Secondary | ICD-10-CM | POA: Diagnosis not present

## 2021-05-09 DIAGNOSIS — H26491 Other secondary cataract, right eye: Secondary | ICD-10-CM | POA: Diagnosis not present

## 2021-05-14 ENCOUNTER — Ambulatory Visit (INDEPENDENT_AMBULATORY_CARE_PROVIDER_SITE_OTHER): Payer: PPO | Admitting: Dermatology

## 2021-05-14 ENCOUNTER — Other Ambulatory Visit: Payer: Self-pay

## 2021-05-14 DIAGNOSIS — L219 Seborrheic dermatitis, unspecified: Secondary | ICD-10-CM

## 2021-05-14 DIAGNOSIS — D229 Melanocytic nevi, unspecified: Secondary | ICD-10-CM

## 2021-05-14 DIAGNOSIS — I8311 Varicose veins of right lower extremity with inflammation: Secondary | ICD-10-CM | POA: Diagnosis not present

## 2021-05-14 DIAGNOSIS — L578 Other skin changes due to chronic exposure to nonionizing radiation: Secondary | ICD-10-CM | POA: Diagnosis not present

## 2021-05-14 DIAGNOSIS — L719 Rosacea, unspecified: Secondary | ICD-10-CM

## 2021-05-14 DIAGNOSIS — Z1283 Encounter for screening for malignant neoplasm of skin: Secondary | ICD-10-CM | POA: Diagnosis not present

## 2021-05-14 DIAGNOSIS — Z85828 Personal history of other malignant neoplasm of skin: Secondary | ICD-10-CM | POA: Diagnosis not present

## 2021-05-14 DIAGNOSIS — L820 Inflamed seborrheic keratosis: Secondary | ICD-10-CM

## 2021-05-14 DIAGNOSIS — L814 Other melanin hyperpigmentation: Secondary | ICD-10-CM

## 2021-05-14 MED ORDER — METRONIDAZOLE 0.75 % EX CREA: TOPICAL_CREAM | Freq: Every day | CUTANEOUS | 6 refills | Status: DC

## 2021-05-14 MED ORDER — FLUOCINOLONE ACETONIDE 0.01 % OT OIL: 1.0000 "application " | TOPICAL_OIL | OTIC | 4 refills | Status: DC

## 2021-05-14 NOTE — Progress Notes (Signed)
Follow-Up Visit   Subjective  Laura Hale is a 75 y.o. female who presents for the following: Total body skin exam (Hx of BCC  nasal tip), Rash (Face, tingling, used HC 2.5% cr last night), and check spots (Face, /R lower leg, tender).  Also spots at R braline that get irritated.   The following portions of the chart were reviewed this encounter and updated as appropriate:       Review of Systems:  No other skin or systemic complaints except as noted in HPI or Assessment and Plan.  Objective  Well appearing patient in no apparent distress; mood and affect are within normal limits.  A full examination was performed including scalp, head, eyes, ears, nose, lips, neck, chest, axillae, abdomen, back, buttocks, bilateral upper extremities, bilateral lower extremities, hands, feet, fingers, toes, fingernails, and toenails. All findings within normal limits unless otherwise noted below.  face Erythema with telangiectasias cheeks bil, noes, chin  ears, scalp Mild scale scalp, ears  L malar cheek x 1, R mid back at braline x 7, Total =8 (8) Erythematous keratotic or waxy stuck-on papule or plaque.   R pretibia Light violaceous slightly indurated plaque 4.0 x 2.0cm   Assessment & Plan   Lentigines - Scattered tan macules - Due to sun exposure - Benign-appearing, observe - Recommend daily broad spectrum sunscreen SPF 30+ to sun-exposed areas, reapply every 2 hours as needed. - Call for any changes  Seborrheic Keratoses - Stuck-on, waxy, tan-brown papules and/or plaques  - Benign-appearing - Discussed benign etiology and prognosis. - Observe - Call for any changes  Melanocytic Nevi - Tan-brown and/or pink-flesh-colored symmetric macules and papules - Benign appearing on exam today - Observation - Call clinic for new or changing moles - Recommend daily use of broad spectrum spf 30+ sunscreen to sun-exposed areas.   Actinic Damage - Chronic condition, secondary to  cumulative UV/sun exposure - diffuse scaly erythematous macules with underlying dyspigmentation - Recommend daily broad spectrum sunscreen SPF 30+ to sun-exposed areas, reapply every 2 hours as needed.  - Staying in the shade or wearing long sleeves, sun glasses (UVA+UVB protection) and wide brim hats (4-inch brim around the entire circumference of the hat) are also recommended for sun protection.  - Call for new or changing lesions. - chest  Skin cancer screening performed today.  Rosacea face  Rosacea is a chronic progressive skin condition usually affecting the face of adults, causing redness and/or acne bumps. It is treatable but not curable. It sometimes affects the eyes (ocular rosacea) as well. It may respond to topical and/or systemic medication and can flare with stress, sun exposure, alcohol, exercise and some foods.  Daily application of broad spectrum spf 30+ sunscreen to face is recommended to reduce flares.  Recommend gentle cleanser samples of Cerave, Neutrogena Start Metrocream 0.75% qhs   metroNIDAZOLE (METROCREAM) 0.75 % cream - face Apply topically at bedtime. Qhs to face  Seborrheic dermatitis ears, scalp  Seborrheic Dermatitis  -  is a chronic persistent rash characterized by pinkness and scaling most commonly of the mid face but also can occur on the scalp (dandruff), ears; mid chest, mid back and groin.  It tends to be exacerbated by stress and cooler weather.  People who have neurologic disease may experience new onset or exacerbation of existing seborrheic dermatitis.  The condition is not curable but treatable and can be controlled.  Start Fluocinolone oil 1-2 gtts qd/bid prn itching Start H&S shampoo, let sit 5 minutes  before rinsing out  Topical steroids (such as triamcinolone, fluocinolone, fluocinonide, mometasone, clobetasol, halobetasol, betamethasone, hydrocortisone) can cause thinning and lightening of the skin if they are used for too long in the same  area. Your physician has selected the right strength medicine for your problem and area affected on the body. Please use your medication only as directed by your physician to prevent side effects.    Fluocinolone Acetonide 0.01 % OIL - ears, scalp Place 1 application in ear(s) as directed. Apply 1-2 gtts to itchy ears qd to bid prn flares  Inflamed seborrheic keratosis L malar cheek x 1, R mid back at braline x 7, Total =8  Destruction of lesion - L malar cheek x 1, R mid back at braline x 7, Total =8  Destruction method: cryotherapy   Informed consent: discussed and consent obtained   Lesion destroyed using liquid nitrogen: Yes   Region frozen until ice ball extended beyond lesion: Yes   Outcome: patient tolerated procedure well with no complications   Post-procedure details: wound care instructions given   Additional details:  Prior to procedure, discussed risks of blister formation, small wound, skin dyspigmentation, or rare scar following cryotherapy. Recommend Vaseline ointment to treated areas while healing.   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 Discussed that it may progress and gradually worsen over time, especially in the setting of chronic leg swelling. Daily compression stockings/hose is recommended.   Benign, observe  Discussed topical steroid, pt declines  History of Basal Cell Carcinoma of the Skin - No evidence of recurrence today - Recommend regular full body skin exams - Recommend daily broad spectrum sunscreen SPF 30+ to sun-exposed areas, reapply every 2 hours as needed.  - Call if any new or changing lesions are noted between office visits  - nasal tip  Return in about 1 year (around 05/14/2022) for TBSE, Hx of BCC.  I, Othelia Pulling, RMA, am acting as scribe for Brendolyn Patty, MD  .  Documentation: I have reviewed the above documentation for accuracy and completeness, and I agree with the above.  Brendolyn Patty MD

## 2021-05-14 NOTE — Patient Instructions (Addendum)
If you have any questions or concerns for your doctor, please call our main line at [phone] and press option 4 to reach your doctor's medical assistant. If no one answers, please leave a voicemail as directed and we will return your call as soon as possible. Messages left after 4 pm will be answered the following business day.   You may also send [place] a message via [place]. We typically respond to MyChart messages within 1-2 business days.  For prescription refills, please ask your pharmacy to contact our office. Our fax number is [phone].  If you have an urgent issue when the clinic is closed that cannot wait until the next business day, you can page your doctor at the number below.    Please note that while we do our best to be available for urgent issues outside of office hours, we are not available 24/7.   If you have an urgent issue and are unable to reach [place], you may choose to seek medical care at your doctor's office, retail clinic, urgent care center, or emergency room.  If you have a medical emergency, please immediately call 911 or go to the emergency department.  Pager Numbers  - Dr. Nehemiah Massed: [phone]  - Dr. Laurence Ferrari: [phone]  - Dr. Nicole Kindred: [phone]  In the event of inclement weather, please call our main line at [phone] for an update on the status of any delays or closures.  Dermatology Medication Tips: Please keep the boxes that topical medications come in in order to help keep track of the instructions about where and how to use these. Pharmacies typically print the medication instructions only on the boxes and not directly on the medication tubes.   If your medication is too expensive, please contact our office at [phone] option 4 or send [place] a message through [place].   We are unable to tell what your co-pay for medications will be in advance as this is different depending on your insurance coverage. However, we may be able to find a substitute  medication at lower cost or fill out paperwork to get insurance to cover a needed medication.   If a prior authorization is required to get your medication covered by your insurance company, please allow [place] 1-2 business days to complete this process.  Drug prices often vary depending on where the prescription is filled and some pharmacies may offer cheaper prices.  The website www.goodrx.com contains coupons for medications through different pharmacies. The prices here do not account for what the cost may be with help from insurance (it may be cheaper with your insurance), but the website can give you the price if you did not use any insurance.  - You can print the associated coupon and take it with your prescription to the pharmacy.  - You may also stop by our office during regular business hours and pick up a GoodRx coupon card.  - If you need your prescription sent electronically to a different pharmacy, notify our office through [facility] or by phone at [phone] option 4.   For Scalp and ears Start Fluocinolone oil 1-2 drops 1 to 2 times a day as needed for itching  Start H&S shampoo, let sit 5 minutes before rinsing out  Rosacea face Start Metronidazole cream 0.75% nightly to face  Cryotherapy Aftercare  Wash gently with soap and water everyday.   Apply Vaseline and Band-Aid daily until healed.

## 2021-05-16 ENCOUNTER — Other Ambulatory Visit: Payer: Self-pay | Admitting: Internal Medicine

## 2021-05-16 DIAGNOSIS — Z1231 Encounter for screening mammogram for malignant neoplasm of breast: Secondary | ICD-10-CM

## 2021-05-21 DIAGNOSIS — Z23 Encounter for immunization: Secondary | ICD-10-CM | POA: Diagnosis not present

## 2021-06-08 DIAGNOSIS — U071 COVID-19: Secondary | ICD-10-CM | POA: Diagnosis not present

## 2021-06-19 DIAGNOSIS — Z96642 Presence of left artificial hip joint: Secondary | ICD-10-CM | POA: Diagnosis not present

## 2021-06-27 ENCOUNTER — Ambulatory Visit
Admission: RE | Admit: 2021-06-27 | Discharge: 2021-06-27 | Disposition: A | Discharge status: Home / Self Care | Payer: PPO | Source: Ambulatory Visit | Attending: Internal Medicine | Admitting: Internal Medicine

## 2021-06-27 ENCOUNTER — Other Ambulatory Visit: Payer: Self-pay

## 2021-06-27 DIAGNOSIS — Z1231 Encounter for screening mammogram for malignant neoplasm of breast: Secondary | ICD-10-CM | POA: Diagnosis not present

## 2021-06-27 DIAGNOSIS — H26491 Other secondary cataract, right eye: Secondary | ICD-10-CM | POA: Diagnosis not present

## 2021-07-31 DIAGNOSIS — G4733 Obstructive sleep apnea (adult) (pediatric): Secondary | ICD-10-CM | POA: Diagnosis not present

## 2021-07-31 DIAGNOSIS — E559 Vitamin D deficiency, unspecified: Secondary | ICD-10-CM | POA: Diagnosis not present

## 2021-07-31 DIAGNOSIS — M5020 Other cervical disc displacement, unspecified cervical region: Secondary | ICD-10-CM | POA: Diagnosis not present

## 2021-07-31 DIAGNOSIS — J449 Chronic obstructive pulmonary disease, unspecified: Secondary | ICD-10-CM | POA: Diagnosis not present

## 2021-07-31 DIAGNOSIS — E7849 Other hyperlipidemia: Secondary | ICD-10-CM | POA: Diagnosis not present

## 2021-07-31 DIAGNOSIS — R739 Hyperglycemia, unspecified: Secondary | ICD-10-CM | POA: Diagnosis not present

## 2021-08-07 ENCOUNTER — Other Ambulatory Visit: Payer: Self-pay | Admitting: Internal Medicine

## 2021-08-07 DIAGNOSIS — R918 Other nonspecific abnormal finding of lung field: Secondary | ICD-10-CM

## 2021-08-07 DIAGNOSIS — M5020 Other cervical disc displacement, unspecified cervical region: Secondary | ICD-10-CM | POA: Diagnosis not present

## 2021-08-07 DIAGNOSIS — R739 Hyperglycemia, unspecified: Secondary | ICD-10-CM | POA: Diagnosis not present

## 2021-08-07 DIAGNOSIS — G8929 Other chronic pain: Secondary | ICD-10-CM | POA: Diagnosis not present

## 2021-08-07 DIAGNOSIS — M25511 Pain in right shoulder: Secondary | ICD-10-CM | POA: Diagnosis not present

## 2021-08-07 DIAGNOSIS — I10 Essential (primary) hypertension: Secondary | ICD-10-CM | POA: Diagnosis not present

## 2021-08-07 DIAGNOSIS — Z0000 Encounter for general adult medical examination without abnormal findings: Secondary | ICD-10-CM | POA: Diagnosis not present

## 2021-08-07 DIAGNOSIS — Z6841 Body Mass Index (BMI) 40.0 and over, adult: Secondary | ICD-10-CM | POA: Diagnosis not present

## 2021-08-07 DIAGNOSIS — J449 Chronic obstructive pulmonary disease, unspecified: Secondary | ICD-10-CM | POA: Diagnosis not present

## 2021-08-07 DIAGNOSIS — G4733 Obstructive sleep apnea (adult) (pediatric): Secondary | ICD-10-CM | POA: Diagnosis not present

## 2021-08-07 DIAGNOSIS — E7849 Other hyperlipidemia: Secondary | ICD-10-CM | POA: Diagnosis not present

## 2021-08-07 DIAGNOSIS — E559 Vitamin D deficiency, unspecified: Secondary | ICD-10-CM | POA: Diagnosis not present

## 2021-08-09 ENCOUNTER — Ambulatory Visit: Payer: PPO | Attending: Internal Medicine | Admitting: Physical Therapy

## 2021-08-09 ENCOUNTER — Encounter: Payer: Self-pay | Admitting: Physical Therapy

## 2021-08-09 DIAGNOSIS — M546 Pain in thoracic spine: Secondary | ICD-10-CM | POA: Diagnosis not present

## 2021-08-09 DIAGNOSIS — R918 Other nonspecific abnormal finding of lung field: Secondary | ICD-10-CM | POA: Diagnosis not present

## 2021-08-09 DIAGNOSIS — M25612 Stiffness of left shoulder, not elsewhere classified: Secondary | ICD-10-CM | POA: Insufficient documentation

## 2021-08-09 DIAGNOSIS — M25611 Stiffness of right shoulder, not elsewhere classified: Secondary | ICD-10-CM | POA: Diagnosis not present

## 2021-08-09 NOTE — Therapy (Addendum)
[facility] [address], [name], [address] Phone: [phone]   Fax:  [phone]  Physical Therapy Evaluation  Patient Details  Name: Laura Hale MRN: [id number] Date of Birth: 06/04/1946 Referring Provider (PT): Caryl Comes [facility]   Encounter Date: 08/09/2021     Past Medical History:  Diagnosis Date   Arthritis    elbow, hands, neck    Asthma    allergy induced asthma-only exacerbated by smells like perfume, smoke etc   Atypical childhood psychosis 2007   myoview -negative for ischemia with preserved LV function   Basal cell carcinoma ~2006   nasal tip   Colon polyps    Complication of anesthesia    WITH ACDF IN JUNE 2016-PT [place] X 10 WEEKS DUE TO INTUBATION   COPD (chronic obstructive pulmonary disease) ([place])    Difficult intubation    with last procedure.  couldn't talk due to tube   Edema    LEGS/FEET   Hypercalcemia 2009   calcium  WNL 10.2 on 04-2015   Hyperlipidemia    Obesity    Urinary incontinence     Past Surgical History:  Procedure Laterality Date   ABDOMINAL HYSTERECTOMY     one ovary remains   ANTERIOR CERVICAL DECOMP/DISCECTOMY FUSION N/A 01/31/2015   Procedure: ANTERIOR CERVICAL DECOMPRESSION/DISCECTOMY FUSION CERVICAL FIVE SIX;  Surgeon: Karie Chimera, MD;  Location: [place] [facility];  Service: Neurosurgery;  Laterality: N/A;   BACK SURGERY     C 6-C7 neck    CATARACT EXTRACTION W/PHACO Left 02/06/2017   Procedure: CATARACT EXTRACTION PHACO AND INTRAOCULAR LENS PLACEMENT (IOC);  Surgeon: Eulogio Bear, MD;  Location: [facility];  Service: Ophthalmology;  Laterality: Left;  Lot# [id number] H [place]: 00:37.9 AP%: 7.9 CDE: 2.97   CATARACT EXTRACTION W/PHACO Right 03/06/2017   Procedure: CATARACT EXTRACTION PHACO AND INTRAOCULAR LENS PLACEMENT (IOC);  Surgeon: Eulogio Bear, MD;  Location: [facility];  Service: Ophthalmology;  Laterality: Right;  Lot  #[id number] H [place]: 00.22.0 AP%: 7.5 CDE: 1.64    COLONOSCOPY     COLONOSCOPY WITH PROPOFOL N/A 09/23/2017   Procedure: COLONOSCOPY WITH PROPOFOL;  Surgeon: Toledo, Benay Pike, MD;  Location: [facility];  Service: Gastroenterology;  Laterality: N/A;   ELBOW SURGERY Left 04/2003   x2  & for repair & then remove all hardware, due to injury from a fall   EYE SURGERY Bilateral    cataracts   FRACTURE SURGERY Right    shoulder and arm   FRACTURE SURGERY Left 2003   elbow   HAND SURGERY Right 2006   injury- then eventually lost remainder of thumb   KNEE ARTHROSCOPY WITH LATERAL MENISECTOMY Left 01/23/2016   Procedure: KNEE ARTHROSCOPY WITH LATERAL MENISECTOMY;  Surgeon: Corky Mull, MD;  Location: [facility];  Service: Orthopedics;  Laterality: Left;   ORIF HUMERUS FRACTURE Right 08/31/2015   Procedure: OPEN REDUCTION INTERNAL FIXATION (ORIF) PROXIMAL HUMERUS FRACTURE w/ #2 fberwire;  Surgeon: Corky Mull, MD;  Location: [facility];  Service: Orthopedics;  Laterality: Right;   THUMB AMPUTATION  2005   partial   TOTAL HIP ARTHROPLASTY Left 06/21/2020   Procedure: TOTAL HIP ARTHROPLASTY;  Surgeon: Dereck Leep, MD;  Location: [facility];  Service: Orthopedics;  Laterality: Left;    There were no vitals filed for this visit.        OBJECTIVE  MUSCULOSKELETAL: Tremor: Normal Bulk: Normal Tone: Normal  Cervical Screen AROM: WFL and painless with  overpressure in all planes EXCEPT R: 28d L: 26d with tension at CL upper trap  Spurlings A (ipsilateral lateral flexion/axial compression): R: Negative L: Negative Spurlings B (ipsilateral lateral flexion/contralateral rotation/axial compression): R: Negative L: Negative Repeated movement: No centralization or peripheralization with protraction or retraction Hoffman Sign (cervical cord compression): R: Negative L: Negative   Elbow Screen Elbow AROM:  R WNL L 21-132d  Palpation Pain to palpation at bilat pec minor insertions, and L bicep  superior muscle belly and insertion   Posture: forward head rounded shoulders increased thoracic kyphosis  Strength R/L 5/5 Shoulder flexion (anterior deltoid/pec major/coracobrachialis, axillary n. (C5-6) and musculocutaneous n. (C5-7)) 5/5 Shoulder abduction (deltoid/supraspinatus, axillary/suprascapular n, C5) 5/5 Shoulder external rotation (infraspinatus/teres minor) 5/5 Shoulder internal rotation (subcapularis/lats/pec major) 5/5 Shoulder extension (posterior deltoid, lats, teres major, axillary/thoracodorsal n.) 5/5 Elbow flexion (biceps brachii, brachialis, brachioradialis, musculoskeletal n, C5-6) 5/5 Elbow extension (triceps, radial n, C7) 3+/3+ Y lower Trap 4-/4- T scapular retractors   PROM R/L APPROX 160 Shoulder flexion 121/124 Shoulder abduction 51/50 Shoulder external rotation 52/53 Shoulder internal rotation 60/60 Shoulder extension *Indicates pain, overpressure performed unless otherwise indicated  AROM R/L 146/143 Shoulder flexion 140/142 Shoulder abduction C6/C2  Shoulder external rotation R PSIS/L PSIS Shoulder internal rotation 60/60 Shoulder extension Thoracic flexion WNL Lumbothoracic rotation ~10% deficit with reported tension each direction Thoracic ext: 25% limited with clear strain Scapular motions WNL with difficulty obtaining retraction without cuing *Indicates pain, overpressure performed unless otherwise indicated  Accessory Motions/Glides Glenohumeral: Posterior: R: normal L: normal Inferior: R: normal L: normal Anterior: R: normal L: normal Springy end feel d/t soft tissue, mob with movement with empty end feel at end range due to pain (see range available in PROM section)  Acromioclavicular:  Posterior: R: normal L: normal Anterior: R: normal L: normal  Sternoclavicular: Posterior: R: normal L: normal Anterior: R: normal L: normal Superior: R: normal L: normal Inferior: R: normal L: normal  Muscle Length Testing Pectoralis  Major: R: normal L: normal Pectoralis Minor: R: abnormal L: abnormal Biceps: R: normal L: abnormal  NEUROLOGICAL: Sensation in tact  SPECIAL TESTS  Rotator Cuff  Drop Arm Test: Negative Painful Arc (Pain from 60 to 120 degrees scaption): Positive Infraspinatus Muscle Test: Negative If all 3 tests positive, the probability of a full-thickness rotator cuff tear is 91%  Subacromial Impingement Hawkins-Kennedy: Positive Neer (Block scapula, PROM flexion): Positive Painful Arc (Pain from 60 to 120 degrees scaption): Positive Empty Can: Positive External Rotation Resistance: Negative  Labral Tear Biceps Load II (120 elevation, full ER, 90 elbow flexion, full supination, resisted elbow flexion): Negative Crank (160 scaption, axial load with IR/ER): Negative Active Compression Test: Negative  Bicep Tendon Pathology Speed (shoulder flexion to 90, external rotation, full elbow extension, and forearm supination with resistance: Positive bilat Yergason's (resisted shoulder ER and supination/biceps tendon pathology): Positive bilat *difficult to achieve test positions on LUE  Shoulder Instability Sulcus Sign: Negative Anterior Apprehension: Negative  Ther-Ex PT reviewed the following HEP with patient with patient able to demonstrate a set of the following with min cuing for correction needed. PT educated patient on parameters of therex (how/when to inc/decrease intensity, frequency, rep/set range, stretch hold time, and purpose of therex) with verbalized understanding.   Access Code: [email or web address] URL: [email or web address] Date: 08/09/2021 Prepared by: Durwin Reges  Exercises Seated Scapular Retraction - 8 x daily - 7 x weekly - 12-20 reps - 2-3sec hold Seated Thoracic Lumbar Extension with Pectoralis Stretch - 3 x daily - 7 x  weekly - 12-20 reps - 2-3sec hold Doorway Pec Stretch at 60 Degrees Abduction with Arm Straight - 3 x daily - 7 x weekly - 30-60sec  hold                              Objective measurements completed on examination: See above findings.                   PT Short Term Goals - 08/10/21 1045       PT SHORT TERM GOAL #1   Title Pt will be independent with HEP in order to improve strength and decrease pain in order to improve pain-free function at home and work.    Baseline 08/10/21 HEP given    Time 4    Period Weeks    Status New               PT Long Term Goals - 08/10/21 1045       PT LONG TERM GOAL #1   Title Patient will demonstrate full bilat shoulder AROM in order to complete self care ADLs    Baseline 08/09/21 R/L flexion 146/143; abd 140/142; ER C6/C2; IR R PSIS/L PSIS    Time 8    Period Weeks    Status New      PT LONG TERM GOAL #2   Title Pt will decrease worst pain as reported on NPRS by at least 3 points in order to demonstrate clinically significant reduction in pain.    Baseline 08/09/21 4/10    Time 8    Period Weeks    Status New      PT LONG TERM GOAL #3   Title Pt will increase periscapular strength of  to at least 4/5 MMT grade in order to demonstrate improvement in strength and function    Baseline 08/09/21 Y lower trap 3+/5 bilat; T scap retractors 4-/5 bilat    Time 8    Period Weeks    Status New      PT LONG TERM GOAL #4   Title Patient will increase FOTO score to 65 to demonstrate predicted increase in functional mobility to complete ADLs    Baseline 08/09/21 42    Time 8    Period Weeks    Status New                      Patient will benefit from skilled therapeutic intervention in order to improve the following deficits and impairments:  Decreased activity tolerance, Decreased endurance, Decreased strength, Increased fascial restricitons, Impaired UE functional use, Improper body mechanics, Pain, Postural dysfunction, Impaired tone, Impaired flexibility, Decreased mobility, Decreased safety awareness  Visit  Diagnosis: Stiffness of left shoulder, not elsewhere classified  Pulmonary nodules - Plan: CT CHEST WO CONTRAST, PT plan of care cert/re-cert  Stiffness of right shoulder, not elsewhere classified  Pain in thoracic spine     Problem List Patient Active Problem List   Diagnosis Date Noted   Stiffness of left shoulder, not elsewhere classified 08/15/2021   Hx of total hip arthroplasty, left 06/21/2020   COPD with asthma ([place]) 06/18/2020   Hyperglycemia 06/18/2020   Hyperlipidemia 06/18/2020   Urinary incontinence, mixed 06/18/2020   Pulmonary nodules 06/07/2020   Avascular necrosis of femoral head, left ([place]) 04/02/2020   Primary osteoarthritis of left hip 04/02/2020   Numbness 08/19/2019   Ataxia 07/15/2019   Bilateral carpal tunnel  syndrome 07/15/2019   Obstructive sleep apnea 06/19/2017   BMI 40.0-44.9, adult ([place]) 04/29/2016   Primary osteoarthritis of left knee 01/19/2016   Rotator cuff tendinitis, left 11/20/2015   Complex tear of lateral meniscus of right knee as current injury 11/20/2015   Primary osteoarthritis of right knee 11/20/2015   Essential hypertension 10/19/2015   Chronic pain of both shoulders 07/20/2015   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   Neuropathy 04/13/2014   Vitamin D deficiency 10/15/2013    Durwin Reges, PT 08/15/2021, 2:32 PM  [facility] [address], [name], [address] Phone: [phone]   Fax:  [phone]  Name: Laura Hale MRN: [id number] Date of Birth: 1945-08-24

## 2021-08-15 ENCOUNTER — Ambulatory Visit: Payer: PPO | Attending: Internal Medicine | Admitting: Physical Therapy

## 2021-08-15 ENCOUNTER — Encounter: Payer: Self-pay | Admitting: Physical Therapy

## 2021-08-15 DIAGNOSIS — M25611 Stiffness of right shoulder, not elsewhere classified: Secondary | ICD-10-CM | POA: Insufficient documentation

## 2021-08-15 DIAGNOSIS — M25612 Stiffness of left shoulder, not elsewhere classified: Secondary | ICD-10-CM | POA: Diagnosis not present

## 2021-08-15 DIAGNOSIS — R918 Other nonspecific abnormal finding of lung field: Secondary | ICD-10-CM | POA: Insufficient documentation

## 2021-08-15 DIAGNOSIS — M546 Pain in thoracic spine: Secondary | ICD-10-CM | POA: Diagnosis not present

## 2021-08-15 NOTE — Addendum Note (Signed)
Addended by: Kelton Pillar on: 08/15/2021 02:34 PM   Modules accepted: Orders

## 2021-08-15 NOTE — Therapy (Signed)
[facility] [address], [name], [address] Phone: [phone]   Fax:  [phone]  Physical Therapy Treatment  Patient Details  Name: Laura Hale MRN: [id number] Date of Birth: 23-Sep-1945 Referring Provider (PT): Caryl Comes [facility]   Encounter Date: 08/15/2021   PT End of Session - 08/15/21 1450     Visit Number 2    Number of Visits 17    Date for PT Re-Evaluation 10/05/21    Authorization - Visit Number 2    Authorization - Number of Visits 10    PT Start Time 4270    PT Stop Time 1513    PT Time Calculation (min) 38 min    Activity Tolerance Patient tolerated treatment well    Behavior During Therapy [facility] for tasks assessed/performed             Past Medical History:  Diagnosis Date   Arthritis    elbow, hands, neck    Asthma    allergy induced asthma-only exacerbated by smells like perfume, smoke etc   Atypical childhood psychosis 2007   myoview -negative for ischemia with preserved LV function   Basal cell carcinoma ~2006   nasal tip   Colon polyps    Complication of anesthesia    WITH ACDF IN JUNE 2016-PT [place] X 10 WEEKS DUE TO INTUBATION   COPD (chronic obstructive pulmonary disease) ([place])    Difficult intubation    with last procedure.  couldn't talk due to tube   Edema    LEGS/FEET   Hypercalcemia 2009   calcium  WNL 10.2 on 04-2015   Hyperlipidemia    Obesity    Urinary incontinence     Past Surgical History:  Procedure Laterality Date   ABDOMINAL HYSTERECTOMY     one ovary remains   ANTERIOR CERVICAL DECOMP/DISCECTOMY FUSION N/A 01/31/2015   Procedure: ANTERIOR CERVICAL DECOMPRESSION/DISCECTOMY FUSION CERVICAL FIVE SIX;  Surgeon: Karie Chimera, MD;  Location: [place];  Service: Neurosurgery;  Laterality: N/A;   BACK SURGERY     C 6-C7 neck    CATARACT EXTRACTION W/PHACO Left 02/06/2017   Procedure: CATARACT EXTRACTION PHACO AND INTRAOCULAR LENS  PLACEMENT (IOC);  Surgeon: Eulogio Bear, MD;  Location: [facility];  Service: Ophthalmology;  Laterality: Left;  Lot# [id number] H [place]: 00:37.9 AP%: 7.9 CDE: 2.97   CATARACT EXTRACTION W/PHACO Right 03/06/2017   Procedure: CATARACT EXTRACTION PHACO AND INTRAOCULAR LENS PLACEMENT (IOC);  Surgeon: Eulogio Bear, MD;  Location: [facility];  Service: Ophthalmology;  Laterality: Right;  Lot #[id number] H [place]: 00.22.0 AP%: 7.5 CDE: 1.64    COLONOSCOPY     COLONOSCOPY WITH PROPOFOL N/A 09/23/2017   Procedure: COLONOSCOPY WITH PROPOFOL;  Surgeon: Toledo, Benay Pike, MD;  Location: [facility];  Service: Gastroenterology;  Laterality: N/A;   ELBOW SURGERY Left 04/2003   x2  & for repair & then remove all hardware, due to injury from a fall   EYE SURGERY Bilateral    cataracts   FRACTURE SURGERY Right    shoulder and arm   FRACTURE SURGERY Left 2003   elbow   HAND SURGERY Right 2006   injury- then eventually lost remainder of thumb   KNEE ARTHROSCOPY WITH LATERAL MENISECTOMY Left 01/23/2016   Procedure: KNEE ARTHROSCOPY WITH LATERAL MENISECTOMY;  Surgeon: Corky Mull, MD;  Location: [facility];  Service: Orthopedics;  Laterality: Left;   ORIF HUMERUS FRACTURE Right 08/31/2015   Procedure: OPEN REDUCTION  INTERNAL FIXATION (ORIF) PROXIMAL HUMERUS FRACTURE w/ #2 fberwire;  Surgeon: Corky Mull, MD;  Location: [facility];  Service: Orthopedics;  Laterality: Right;   THUMB AMPUTATION  2005   partial   TOTAL HIP ARTHROPLASTY Left 06/21/2020   Procedure: TOTAL HIP ARTHROPLASTY;  Surgeon: Dereck Leep, MD;  Location: [facility];  Service: Orthopedics;  Laterality: Left;    There were no vitals filed for this visit.   Subjective Assessment - 08/15/21 1438     Subjective Reports continued bilat posterior lateral shoulder pain. Reports 4/10 pain today. Some compliance with HEP. Nothing else new to note this session.    Pertinent History Pt is a 76 year old female presenting with chronic bilat shoulder  pain. Reports she was in an accident in 2003 where she broke her L elbow, and subsequently had frozen shoulder, and reports she has had off and on pain since then with most recent episode of increased pain a couple months ago. Reports she had a fall 5 years ago and broke her R arm in 3 places and has had off and on pain since then in this shoulder, with most recent episode 3 months ago. Reports increased pain with insidious onset, just that she has not been moving as much. Points to pain at upper arm, inferior to GHJ that is anterior/lateral  of both Ue's, reports they feel the same but her pain can come on at different times in each arm. . Reports this pain feels like a muscle ache, and is 2/10 currently; worst pain 4/10, best 0/10. Reports pain is exacerbated by lifting/carrying, reaching behind her and overhead. Pt is R handed, retired, lives at home with her spouse in a one story home with 1 step to enter, is completing all ADLs ind with pain with bathing and dressing. She is ind with driving and community activity, previously enjoyed walking, but has difficulty doing this following hip replacement last year. Has had no falls in the past 6 months. Pt denies N/V, B&B changes, unexplained weight fluctuation, saddle paresthesia, fever, night sweats, or unrelenting night pain at this time.    Limitations Writing;Lifting;House hold activities    How long can you sit comfortably? unlimited    How long can you stand comfortably? unilimited    How long can you walk comfortably? unlimited by shoulders; due to hip replacement and fear of falling only 5 mins    Diagnostic tests none to date    Patient Stated Goals decrease shoulder pain, and increase mobility in bilat shoulders            Ther-Ex Nustep seat 8 UE 9 L4 with cuing for scapular retraction/protraction with LE assist only Seated Thoracic Lumbar Extension over towel roll with hands behind head x12 Seated scapular retractions x12 with cuing for  retraction without elevation  Bilat shoulder ER YTB 2x 10 with cuing for posture, against wall for TC with better carry over Bilat horizontal abd YTB pull aparts with set up as above with cuing for preventing shoulder hiking with good carry voer Scaption with YTB resistance pull apart with same set up as above 2x 10 with max cuing Lat stretch x6 5sec hold; 1x 45sec with cuing for technique with good carry over Doorway Pec Stretch x30sec                           PT Education - 08/15/21 1446     Education Details therex form/technique,  posture    Person(s) Educated Patient    Methods Explanation;Demonstration;Verbal cues    Comprehension Verbalized understanding;Returned demonstration;Verbal cues required              PT Short Term Goals - 08/10/21 1045       PT SHORT TERM GOAL #1   Title Pt will be independent with HEP in order to improve strength and decrease pain in order to improve pain-free function at home and work.    Baseline 08/10/21 HEP given    Time 4    Period Weeks    Status New               PT Long Term Goals - 08/10/21 1045       PT LONG TERM GOAL #1   Title Patient will demonstrate full bilat shoulder AROM in order to complete self care ADLs    Baseline 08/09/21 R/L flexion 146/143; abd 140/142; ER C6/C2; IR R PSIS/L PSIS    Time 8    Period Weeks    Status New      PT LONG TERM GOAL #2   Title Pt will decrease worst pain as reported on NPRS by at least 3 points in order to demonstrate clinically significant reduction in pain.    Baseline 08/09/21 4/10    Time 8    Period Weeks    Status New      PT LONG TERM GOAL #3   Title Pt will increase periscapular strength of  to at least 4/5 MMT grade in order to demonstrate improvement in strength and function    Baseline 08/09/21 Y lower trap 3+/5 bilat; T scap retractors 4-/5 bilat    Time 8    Period Weeks    Status New      PT LONG TERM GOAL #4   Title Patient will  increase FOTO score to 65 to demonstrate predicted increase in functional mobility to complete ADLs    Baseline 08/09/21 42    Time 8    Period Weeks    Status New                   Plan - 08/15/21 1507     Clinical Impression Statement PT initiated therex for increased scapulohumeral rhythm and activation/strengthening of scapular retractors/depressors with success. Patient is able to comply with all cuing for proper technique of therex with good motivation and no increased pain throughout session. PT reviewed HEP with patient where patient demonstrates and verablizes understanding of HEP exercises. PT wil lcontinue progression as able.    Personal Factors and Comorbidities Comorbidity 3+;Past/Current Experience;Time since onset of injury/illness/exacerbation;Fitness    Comorbidities COPD, arthritis, HLD, chronic pain    Examination-Activity Limitations Reach Overhead;Lift;Carry;Hygiene/Grooming;Dressing    Examination-Participation Restrictions Cleaning;Community Activity;Driving;Meal Prep    Stability/Clinical Decision Making Evolving/Moderate complexity    Clinical Decision Making Moderate    Rehab Potential Good    PT Frequency 2x / week    PT Duration 8 weeks    PT Treatment/Interventions Electrical Stimulation;Moist Heat;DME Instruction;Traction;Ultrasound;Cryotherapy;Iontophoresis 4mg /ml Dexamethasone;Therapeutic exercise;Functional mobility training;Therapeutic activities;Neuromuscular re-education;Manual techniques;Patient/family education;Passive range of motion;Dry needling;Splinting;Taping;Joint Manipulations;Spinal Manipulations    PT Next Visit Plan HEP review, posture education, periscapular strengthening    PT Home Exercise Plan scapular retractions, doorway stretch    Consulted and Agree with Plan of Care Patient             Patient will benefit from skilled therapeutic intervention in order to improve the following deficits  and impairments:  Decreased  activity tolerance, Decreased endurance, Decreased strength, Increased fascial restricitons, Impaired UE functional use, Improper body mechanics, Pain, Postural dysfunction, Impaired tone, Impaired flexibility, Decreased mobility, Decreased safety awareness  Visit Diagnosis: Stiffness of left shoulder, not elsewhere classified  Stiffness of right shoulder, not elsewhere classified  Pain in thoracic spine     Problem List Patient Active Problem List   Diagnosis Date Noted   Stiffness of left shoulder, not elsewhere classified 08/15/2021   Hx of total hip arthroplasty, left 06/21/2020   COPD with asthma ([place]) 06/18/2020   Hyperglycemia 06/18/2020   Hyperlipidemia 06/18/2020   Urinary incontinence, mixed 06/18/2020   Pulmonary nodules 06/07/2020   Avascular necrosis of femoral head, left ([place]) 04/02/2020   Primary osteoarthritis of left hip 04/02/2020   Numbness 08/19/2019   Ataxia 07/15/2019   Bilateral carpal tunnel syndrome 07/15/2019   Obstructive sleep apnea 06/19/2017   BMI 40.0-44.9, adult ([place]) 04/29/2016   Primary osteoarthritis of left knee 01/19/2016   Rotator cuff tendinitis, left 11/20/2015   Complex tear of lateral meniscus of right knee as current injury 11/20/2015   Primary osteoarthritis of right knee 11/20/2015   Essential hypertension 10/19/2015   Chronic pain of both shoulders 07/20/2015   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   Neuropathy 04/13/2014   Vitamin D deficiency 10/15/2013   Durwin Reges DPT Durwin Reges, PT 08/15/2021, 3:24 PM  [facility] [address] [name], [address] Phone: [phone]   Fax:  [phone]  Name: Laura Hale MRN: [id number] Date of Birth: 29-Jul-1946

## 2021-08-17 ENCOUNTER — Ambulatory Visit: Payer: PPO | Admitting: Physical Therapy

## 2021-08-17 ENCOUNTER — Other Ambulatory Visit: Payer: Self-pay

## 2021-08-17 ENCOUNTER — Encounter: Payer: Self-pay | Admitting: Physical Therapy

## 2021-08-17 DIAGNOSIS — M25612 Stiffness of left shoulder, not elsewhere classified: Secondary | ICD-10-CM

## 2021-08-17 DIAGNOSIS — M25611 Stiffness of right shoulder, not elsewhere classified: Secondary | ICD-10-CM

## 2021-08-17 DIAGNOSIS — M546 Pain in thoracic spine: Secondary | ICD-10-CM

## 2021-08-17 NOTE — Therapy (Signed)
[facility] [address], [name], [address] Phone: [phone]   Fax:  [phone]  Physical Therapy Treatment  Patient Details  Name: Laura Hale MRN: [id number] Date of Birth: 1946-03-15 Referring Provider (PT): Caryl Comes [facility]   Encounter Date: 08/17/2021   PT End of Session - 08/17/21 0849     Visit Number 3    Number of Visits 17    Date for PT Re-Evaluation 10/05/21    Authorization - Visit Number 3    Authorization - Number of Visits 10    PT Start Time 0845    PT Stop Time 0925    PT Time Calculation (min) 40 min    Activity Tolerance Patient tolerated treatment well    Behavior During Therapy [facility] for tasks assessed/performed             Past Medical History:  Diagnosis Date   Arthritis    elbow, hands, neck    Asthma    allergy induced asthma-only exacerbated by smells like perfume, smoke etc   Atypical childhood psychosis 2007   myoview -negative for ischemia with preserved LV function   Basal cell carcinoma ~2006   nasal tip   Colon polyps    Complication of anesthesia    WITH ACDF IN JUNE 2016-PT [place] X 10 WEEKS DUE TO INTUBATION   COPD (chronic obstructive pulmonary disease) ([place])    Difficult intubation    with last procedure.  couldn't talk due to tube   Edema    LEGS/FEET   Hypercalcemia 2009   calcium  WNL 10.2 on 04-2015   Hyperlipidemia    Obesity    Urinary incontinence     Past Surgical History:  Procedure Laterality Date   ABDOMINAL HYSTERECTOMY     one ovary remains   ANTERIOR CERVICAL DECOMP/DISCECTOMY FUSION N/A 01/31/2015   Procedure: ANTERIOR CERVICAL DECOMPRESSION/DISCECTOMY FUSION CERVICAL FIVE SIX;  Surgeon: Karie Chimera, MD;  Location: [place] [facility];  Service: Neurosurgery;  Laterality: N/A;   BACK SURGERY     C 6-C7 neck    CATARACT EXTRACTION W/PHACO Left 02/06/2017   Procedure: CATARACT EXTRACTION PHACO AND INTRAOCULAR LENS  PLACEMENT (IOC);  Surgeon: Eulogio Bear, MD;  Location: [facility];  Service: Ophthalmology;  Laterality: Left;  Lot# [id number] H [place]: 00:37.9 AP%: 7.9 CDE: 2.97   CATARACT EXTRACTION W/PHACO Right 03/06/2017   Procedure: CATARACT EXTRACTION PHACO AND INTRAOCULAR LENS PLACEMENT (IOC);  Surgeon: Eulogio Bear, MD;  Location: [facility];  Service: Ophthalmology;  Laterality: Right;  Lot #[id number] H [place]: 00.22.0 AP%: 7.5 CDE: 1.64    COLONOSCOPY     COLONOSCOPY WITH PROPOFOL N/A 09/23/2017   Procedure: COLONOSCOPY WITH PROPOFOL;  Surgeon: Toledo, Benay Pike, MD;  Location: [facility];  Service: Gastroenterology;  Laterality: N/A;   ELBOW SURGERY Left 04/2003   x2  & for repair & then remove all hardware, due to injury from a fall   EYE SURGERY Bilateral    cataracts   FRACTURE SURGERY Right    shoulder and arm   FRACTURE SURGERY Left 2003   elbow   HAND SURGERY Right 2006   injury- then eventually lost remainder of thumb   KNEE ARTHROSCOPY WITH LATERAL MENISECTOMY Left 01/23/2016   Procedure: KNEE ARTHROSCOPY WITH LATERAL MENISECTOMY;  Surgeon: Corky Mull, MD;  Location: [facility];  Service: Orthopedics;  Laterality: Left;   ORIF HUMERUS FRACTURE Right 08/31/2015   Procedure: OPEN REDUCTION  INTERNAL FIXATION (ORIF) PROXIMAL HUMERUS FRACTURE w/ #2 fberwire;  Surgeon: Corky Mull, MD;  Location: [facility];  Service: Orthopedics;  Laterality: Right;   THUMB AMPUTATION  2005   partial   TOTAL HIP ARTHROPLASTY Left 06/21/2020   Procedure: TOTAL HIP ARTHROPLASTY;  Surgeon: Dereck Leep, MD;  Location: [facility];  Service: Orthopedics;  Laterality: Left;    There were no vitals filed for this visit.   Subjective Assessment - 08/17/21 0848     Subjective Patient reports she was sore following last session, and is still a little sore from this today. Reports 2/10 pain today. Good compliance with HEP.    Pertinent History Pt is a 76 year old female presenting with chronic bilat shoulder  pain. Reports she was in an accident in 2003 where she broke her L elbow, and subsequently had frozen shoulder, and reports she has had off and on pain since then with most recent episode of increased pain a couple months ago. Reports she had a fall 5 years ago and broke her R arm in 3 places and has had off and on pain since then in this shoulder, with most recent episode 3 months ago. Reports increased pain with insidious onset, just that she has not been moving as much. Points to pain at upper arm, inferior to GHJ that is anterior/lateral  of both Ue's, reports they feel the same but her pain can come on at different times in each arm. . Reports this pain feels like a muscle ache, and is 2/10 currently; worst pain 4/10, best 0/10. Reports pain is exacerbated by lifting/carrying, reaching behind her and overhead. Pt is R handed, retired, lives at home with her spouse in a one story home with 1 step to enter, is completing all ADLs ind with pain with bathing and dressing. She is ind with driving and community activity, previously enjoyed walking, but has difficulty doing this following hip replacement last year. Has had no falls in the past 6 months. Pt denies N/V, B&B changes, unexplained weight fluctuation, saddle paresthesia, fever, night sweats, or unrelenting night pain at this time.    Limitations Writing;Lifting;House hold activities    How long can you sit comfortably? unlimited    How long can you stand comfortably? unilimited    How long can you walk comfortably? unlimited by shoulders; due to hip replacement and fear of falling only 5 mins    Diagnostic tests none to date    Patient Stated Goals decrease shoulder pain, and increase mobility in bilat shoulders    Pain Onset More than a month ago             Ther-Ex Nustep seat 8 UE 9 L4 with cuing for scapular retraction/protraction with LE assist only Seated Thoracic Lumbar Extension over towel roll with hands behind head x12 Seated  thoracolumbar rotation with tball overhead (pink) to encourage increased ext x20 rotations  Push up plus in plantigrade 2x 12 with difficulty with sequencing, decent eventual carry over Bilat shoulder ER YTB 2x 10 with cuing for posture, seated with good carry over of initial cuing for technique Seated scaption with YTB resistance pull apart 2x 10 with good carry over of initial cuing Low row/bilat shoulder ext RTB 2x 10 with focus of scapular retraction + depression with good carry over  Post shoulder rolls x15 with focus on scapular retraction + depression with success Doorway Pec Stretch x30sec  PT Education - 08/17/21 0849     Education Details therex form/technique    Person(s) Educated Patient    Methods Explanation;Demonstration;Verbal cues    Comprehension Verbalized understanding;Returned demonstration;Verbal cues required              PT Short Term Goals - 08/10/21 1045       PT SHORT TERM GOAL #1   Title Pt will be independent with HEP in order to improve strength and decrease pain in order to improve pain-free function at home and work.    Baseline 08/10/21 HEP given    Time 4    Period Weeks    Status New               PT Long Term Goals - 08/10/21 1045       PT LONG TERM GOAL #1   Title Patient will demonstrate full bilat shoulder AROM in order to complete self care ADLs    Baseline 08/09/21 R/L flexion 146/143; abd 140/142; ER C6/C2; IR R PSIS/L PSIS    Time 8    Period Weeks    Status New      PT LONG TERM GOAL #2   Title Pt will decrease worst pain as reported on NPRS by at least 3 points in order to demonstrate clinically significant reduction in pain.    Baseline 08/09/21 4/10    Time 8    Period Weeks    Status New      PT LONG TERM GOAL #3   Title Pt will increase periscapular strength of  to at least 4/5 MMT grade in order to demonstrate improvement in strength and function    Baseline  08/09/21 Y lower trap 3+/5 bilat; T scap retractors 4-/5 bilat    Time 8    Period Weeks    Status New      PT LONG TERM GOAL #4   Title Patient will increase FOTO score to 65 to demonstrate predicted increase in functional mobility to complete ADLs    Baseline 08/09/21 42    Time 8    Period Weeks    Status New                   Plan - 08/17/21 0912     Clinical Impression Statement PT continued therex progression for increased scapulohumeral rhythm and periscapular strengthening with success. patient is able to demonstrate carry over of all cuing for proepr technique of therex with excellent motivation throughout session. Patient without increased pain throughout session, increased thoracic and scapular mobility noted from evaluation. PT will continue progression as able.    Personal Factors and Comorbidities Comorbidity 3+;Past/Current Experience;Time since onset of injury/illness/exacerbation;Fitness    Comorbidities COPD, arthritis, HLD, chronic pain    Examination-Activity Limitations Reach Overhead;Lift;Carry;Hygiene/Grooming;Dressing    Examination-Participation Restrictions Cleaning;Community Activity;Driving;Meal Prep    Stability/Clinical Decision Making Evolving/Moderate complexity    Clinical Decision Making Moderate    Rehab Potential Good    PT Frequency 2x / week    PT Duration 8 weeks    PT Treatment/Interventions Electrical Stimulation;Moist Heat;DME Instruction;Traction;Ultrasound;Cryotherapy;Iontophoresis 4mg /ml Dexamethasone;Therapeutic exercise;Functional mobility training;Therapeutic activities;Neuromuscular re-education;Manual techniques;Patient/family education;Passive range of motion;Dry needling;Splinting;Taping;Joint Manipulations;Spinal Manipulations    PT Next Visit Plan HEP review, posture education, periscapular strengthening    PT Home Exercise Plan scapular retractions, doorway stretch    Consulted and Agree with Plan of Care Patient              Patient will benefit from  skilled therapeutic intervention in order to improve the following deficits and impairments:  Decreased activity tolerance, Decreased endurance, Decreased strength, Increased fascial restricitons, Impaired UE functional use, Improper body mechanics, Pain, Postural dysfunction, Impaired tone, Impaired flexibility, Decreased mobility, Decreased safety awareness  Visit Diagnosis: Stiffness of left shoulder, not elsewhere classified  Stiffness of right shoulder, not elsewhere classified  Pain in thoracic spine     Problem List Patient Active Problem List   Diagnosis Date Noted   Stiffness of left shoulder, not elsewhere classified 08/15/2021   Hx of total hip arthroplasty, left 06/21/2020   COPD with asthma ([place]) 06/18/2020   Hyperglycemia 06/18/2020   Hyperlipidemia 06/18/2020   Urinary incontinence, mixed 06/18/2020   Pulmonary nodules 06/07/2020   Avascular necrosis of femoral head, left ([place]) 04/02/2020   Primary osteoarthritis of left hip 04/02/2020   Numbness 08/19/2019   Ataxia 07/15/2019   Bilateral carpal tunnel syndrome 07/15/2019   Obstructive sleep apnea 06/19/2017   BMI 40.0-44.9, adult ([place]) 04/29/2016   Primary osteoarthritis of left knee 01/19/2016   Rotator cuff tendinitis, left 11/20/2015   Complex tear of lateral meniscus of right knee as current injury 11/20/2015   Primary osteoarthritis of right knee 11/20/2015   Essential hypertension 10/19/2015   Chronic pain of both shoulders 07/20/2015   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   Neuropathy 04/13/2014   Vitamin D deficiency 10/15/2013   Durwin Reges DPT Durwin Reges, PT 08/17/2021, 9:26 AM  [facility] [address] [name], [address] Phone: [phone]   Fax:  [phone]  Name: Laura Hale MRN: [id number] Date of Birth: 03/21/1946

## 2021-08-21 ENCOUNTER — Ambulatory Visit: Payer: PPO | Admitting: Physical Therapy

## 2021-08-21 ENCOUNTER — Encounter: Payer: Self-pay | Admitting: Physical Therapy

## 2021-08-21 DIAGNOSIS — M25611 Stiffness of right shoulder, not elsewhere classified: Secondary | ICD-10-CM

## 2021-08-21 DIAGNOSIS — M25612 Stiffness of left shoulder, not elsewhere classified: Secondary | ICD-10-CM | POA: Diagnosis not present

## 2021-08-21 NOTE — Therapy (Signed)
[facility] [address], [name], [address] Phone: [phone]   Fax:  [phone]  Physical Therapy Treatment  Patient Details  Name: Laura Hale MRN: [id number] Date of Birth: [birth year] Referring Provider (PT): Caryl Comes [facility]   Encounter Date: 08/21/2021   PT End of Session - 08/21/21 0908     Visit Number 4    Number of Visits 17    Date for PT Re-Evaluation 10/05/21    Authorization - Visit Number 4    Authorization - Number of Visits 10    PT Start Time 0900    PT Stop Time 0940    PT Time Calculation (min) 40 min    Activity Tolerance Patient tolerated treatment well    Behavior During Therapy [facility] for tasks assessed/performed             Past Medical History:  Diagnosis Date   Arthritis    elbow, hands, neck    Asthma    allergy induced asthma-only exacerbated by smells like perfume, smoke etc   Atypical childhood psychosis 2007   myoview -negative for ischemia with preserved LV function   Basal cell carcinoma ~2006   nasal tip   Colon polyps    Complication of anesthesia    WITH ACDF IN JUNE 2016-PT STATES SHE LOST HER VOICE X 10 WEEKS DUE TO INTUBATION   COPD (chronic obstructive pulmonary disease) ([place])    Difficult intubation    with last procedure.  couldn't talk due to tube   Edema    LEGS/FEET   Hypercalcemia 2009   calcium  WNL 10.2 on 04-2015   Hyperlipidemia    Obesity    Urinary incontinence     Past Surgical History:  Procedure Laterality Date   ABDOMINAL HYSTERECTOMY     one ovary remains   ANTERIOR CERVICAL DECOMP/DISCECTOMY FUSION N/A 01/31/2015   Procedure: ANTERIOR CERVICAL DECOMPRESSION/DISCECTOMY FUSION CERVICAL FIVE SIX;  Surgeon: Karie Chimera, MD;  Location: [place] [facility];  Service: Neurosurgery;  Laterality: N/A;   BACK SURGERY     C 6-C7 neck    CATARACT EXTRACTION W/PHACO Left 02/06/2017   Procedure: CATARACT EXTRACTION PHACO AND INTRAOCULAR LENS  PLACEMENT (IOC);  Surgeon: Eulogio Bear, MD;  Location: [facility];  Service: Ophthalmology;  Laterality: Left;  Lot# [id number] H [place]: 00:37.9 AP%: 7.9 CDE: 2.97   CATARACT EXTRACTION W/PHACO Right 03/06/2017   Procedure: CATARACT EXTRACTION PHACO AND INTRAOCULAR LENS PLACEMENT (IOC);  Surgeon: Eulogio Bear, MD;  Location: [facility];  Service: Ophthalmology;  Laterality: Right;  Lot #[id number] H [place]: 00.22.0 AP%: 7.5 CDE: 1.64    COLONOSCOPY     COLONOSCOPY WITH PROPOFOL N/A 09/23/2017   Procedure: COLONOSCOPY WITH PROPOFOL;  Surgeon: Toledo, Benay Pike, MD;  Location: [facility];  Service: Gastroenterology;  Laterality: N/A;   ELBOW SURGERY Left 04/2003   x2  & for repair & then remove all hardware, due to injury from a fall   EYE SURGERY Bilateral    cataracts   FRACTURE SURGERY Right    shoulder and arm   FRACTURE SURGERY Left 2003   elbow   HAND SURGERY Right 2006   injury- then eventually lost remainder of thumb   KNEE ARTHROSCOPY WITH LATERAL MENISECTOMY Left 01/23/2016   Procedure: KNEE ARTHROSCOPY WITH LATERAL MENISECTOMY;  Surgeon: Corky Mull, MD;  Location: [facility];  Service: Orthopedics;  Laterality: Left;   ORIF HUMERUS FRACTURE Right 08/31/2015   Procedure: OPEN REDUCTION  INTERNAL FIXATION (ORIF) PROXIMAL HUMERUS FRACTURE w/ #2 fberwire;  Surgeon: Corky Mull, MD;  Location: [facility];  Service: Orthopedics;  Laterality: Right;   THUMB AMPUTATION  2005   partial   TOTAL HIP ARTHROPLASTY Left 06/21/2020   Procedure: TOTAL HIP ARTHROPLASTY;  Surgeon: Dereck Leep, MD;  Location: [facility];  Service: Orthopedics;  Laterality: Left;    There were no vitals filed for this visit.   Subjective Assessment - 08/21/21 0906     Subjective Patient reports 4/10 pain of the R shoulder today. Reports she did more housework than usual yesterday, thinks this is making her more sore. Better compliance with her HEP. Reports no changes to medications or medical history since last  visit.    Pertinent History Pt is a 76 year old female presenting with chronic bilat shoulder pain. Reports she was in an accident in 2003 where she broke her L elbow, and subsequently had frozen shoulder, and reports she has had off and on pain since then with most recent episode of increased pain a couple months ago. Reports she had a fall 5 years ago and broke her R arm in 3 places and has had off and on pain since then in this shoulder, with most recent episode 3 months ago. Reports increased pain with insidious onset, just that she has not been moving as much. Points to pain at upper arm, inferior to GHJ that is anterior/lateral  of both Ue's, reports they feel the same but her pain can come on at different times in each arm. . Reports this pain feels like a muscle ache, and is 2/10 currently; worst pain 4/10, best 0/10. Reports pain is exacerbated by lifting/carrying, reaching behind her and overhead. Pt is R handed, retired, lives at home with her spouse in a one story home with 1 step to enter, is completing all ADLs ind with pain with bathing and dressing. She is ind with driving and community activity, previously enjoyed walking, but has difficulty doing this following hip replacement last year. Has had no falls in the past 6 months. Pt denies N/V, B&B changes, unexplained weight fluctuation, saddle paresthesia, fever, night sweats, or unrelenting night pain at this time.    Limitations Writing;Lifting;House hold activities    How long can you sit comfortably? unlimited    How long can you stand comfortably? unilimited    How long can you walk comfortably? unlimited by shoulders; due to hip replacement and fear of falling only 5 mins    Diagnostic tests none to date    Patient Stated Goals decrease shoulder pain, and increase mobility in bilat shoulders    Pain Onset More than a month ago              Ther-Ex Nustep seat 8 UE 9 L4 with cuing for scapular retraction/protraction with LE  assist only Standing Thoracic Lumbar Extension over towel roll with hands behind head x12 Seated thoracolumbar rotation with tball overhead (pink) to encourage increased ext x20 rotations (break at about 10 reps d/t UE soreness) Prone scapular retraction + depression with UE lift x10 exercise ceased d/t stomach pain (constipation) Supine scapular retraction + protraction (punches) x12; with 2# DB 2x 10  Supine overhead flex with focus of scapular retraction and depression without excessive thoracic ext x12; supine pullovers 3# DB 2x 10 with good carry over of technique Seated scaption with YTB resistance pull apart 2x 10 with good carry over supine scapulohumeral rhythm Post shoulder rolls  x15 with focus on scapular retraction + depression with success                           PT Education - 08/21/21 0908     Education Details therex form/technique    Person(s) Educated Patient    Methods Explanation;Demonstration;Verbal cues    Comprehension Verbalized understanding;Returned demonstration;Verbal cues required              PT Short Term Goals - 08/10/21 1045       PT SHORT TERM GOAL #1   Title Pt will be independent with HEP in order to improve strength and decrease pain in order to improve pain-free function at home and work.    Baseline 08/10/21 HEP given    Time 4    Period Weeks    Status New               PT Long Term Goals - 08/10/21 1045       PT LONG TERM GOAL #1   Title Patient will demonstrate full bilat shoulder AROM in order to complete self care ADLs    Baseline 08/09/21 R/L flexion 146/143; abd 140/142; ER C6/C2; IR R PSIS/L PSIS    Time 8    Period Weeks    Status New      PT LONG TERM GOAL #2   Title Pt will decrease worst pain as reported on NPRS by at least 3 points in order to demonstrate clinically significant reduction in pain.    Baseline 08/09/21 4/10    Time 8    Period Weeks    Status New      PT LONG TERM GOAL  #3   Title Pt will increase periscapular strength of  to at least 4/5 MMT grade in order to demonstrate improvement in strength and function    Baseline 08/09/21 Y lower trap 3+/5 bilat; T scap retractors 4-/5 bilat    Time 8    Period Weeks    Status New      PT LONG TERM GOAL #4   Title Patient will increase FOTO score to 65 to demonstrate predicted increase in functional mobility to complete ADLs    Baseline 08/09/21 42    Time 8    Period Weeks    Status New                   Plan - 08/21/21 0933     Clinical Impression Statement PT continued therex progression for increased scapulohumeral rhythm and periscapular strengthening needed for efficiency of ADL reaching and lifting with success. Patinet is able to comply with all cuing for proper technique of therex with excellent carry over into upright positions. Paitent with good motivation and no increased pain throughout session, and no increased pain throughout session. Pain reduction to 3/10 following session. PT will continue progression as able.    Personal Factors and Comorbidities Comorbidity 3+;Past/Current Experience;Time since onset of injury/illness/exacerbation;Fitness    Comorbidities COPD, arthritis, HLD, chronic pain    Examination-Activity Limitations Reach Overhead;Lift;Carry;Hygiene/Grooming;Dressing    Examination-Participation Restrictions Cleaning;Community Activity;Driving;Meal Prep    Stability/Clinical Decision Making Evolving/Moderate complexity    Clinical Decision Making Moderate    Rehab Potential Good    PT Frequency 2x / week    PT Duration 8 weeks    PT Treatment/Interventions Electrical Stimulation;Moist Heat;DME Instruction;Traction;Ultrasound;Cryotherapy;Iontophoresis 4mg /ml Dexamethasone;Therapeutic exercise;Functional mobility training;Therapeutic activities;Neuromuscular re-education;Manual techniques;Patient/family education;Passive range of motion;Dry needling;Splinting;Taping;Joint  Manipulations;Spinal  Manipulations    PT Next Visit Plan HEP review, posture education, periscapular strengthening    PT Home Exercise Plan scapular retractions, doorway stretch    Consulted and Agree with Plan of Care Patient             Patient will benefit from skilled therapeutic intervention in order to improve the following deficits and impairments:  Decreased activity tolerance, Decreased endurance, Decreased strength, Increased fascial restricitons, Impaired UE functional use, Improper body mechanics, Pain, Postural dysfunction, Impaired tone, Impaired flexibility, Decreased mobility, Decreased safety awareness  Visit Diagnosis: Stiffness of left shoulder, not elsewhere classified  Stiffness of right shoulder, not elsewhere classified     Problem List Patient Active Problem List   Diagnosis Date Noted   Stiffness of left shoulder, not elsewhere classified 08/15/2021   Hx of total hip arthroplasty, left 06/21/2020   COPD with asthma ([place]) 06/18/2020   Hyperglycemia 06/18/2020   Hyperlipidemia 06/18/2020   Urinary incontinence, mixed 06/18/2020   Pulmonary nodules 06/07/2020   Avascular necrosis of femoral head, left ([place]) 04/02/2020   Primary osteoarthritis of left hip 04/02/2020   Numbness 08/19/2019   Ataxia 07/15/2019   Bilateral carpal tunnel syndrome 07/15/2019   Obstructive sleep apnea 06/19/2017   BMI 40.0-44.9, adult ([place]) 04/29/2016   Primary osteoarthritis of left knee 01/19/2016   Rotator cuff tendinitis, left 11/20/2015   Complex tear of lateral meniscus of right knee as current injury 11/20/2015   Primary osteoarthritis of right knee 11/20/2015   Essential hypertension 10/19/2015   Chronic pain of both shoulders 07/20/2015   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   Neuropathy 04/13/2014   Vitamin D deficiency 10/15/2013   Durwin Reges DPT Durwin Reges, PT 08/21/2021, 9:45 AM  [facility] [address], [name], [address] Phone: [phone]   Fax:  [phone]  Name: Laura Hale MRN: [id number] Date of Birth: [birth year]

## 2021-08-23 ENCOUNTER — Encounter: Payer: Self-pay | Admitting: Physical Therapy

## 2021-08-23 ENCOUNTER — Ambulatory Visit: Payer: PPO | Admitting: Physical Therapy

## 2021-08-23 DIAGNOSIS — M25612 Stiffness of left shoulder, not elsewhere classified: Secondary | ICD-10-CM

## 2021-08-23 DIAGNOSIS — M25611 Stiffness of right shoulder, not elsewhere classified: Secondary | ICD-10-CM

## 2021-08-23 NOTE — Therapy (Signed)
[facility] [address], [name], [address] Phone: [phone]   Fax:  [phone]  Physical Therapy Treatment  Patient Details  Name: Laura Hale MRN: [id number] Date of Birth: 1945-10-14 Referring Provider (PT): Caryl Comes [facility]   Encounter Date: 08/23/2021   PT End of Session - 08/23/21 1136     Visit Number 5    Number of Visits 17    Date for PT Re-Evaluation 10/05/21    Authorization - Visit Number 5    Authorization - Number of Visits 10    PT Start Time 6962    PT Stop Time 1155    PT Time Calculation (min) 40 min    Activity Tolerance Patient tolerated treatment well    Behavior During Therapy [facility] for tasks assessed/performed             Past Medical History:  Diagnosis Date   Arthritis    elbow, hands, neck    Asthma    allergy induced asthma-only exacerbated by smells like perfume, smoke etc   Atypical childhood psychosis 2007   myoview -negative for ischemia with preserved LV function   Basal cell carcinoma ~2006   nasal tip   Colon polyps    Complication of anesthesia    WITH ACDF IN JUNE 2016-PT [place] X 10 WEEKS DUE TO INTUBATION   COPD (chronic obstructive pulmonary disease) ([place])    Difficult intubation    with last procedure.  couldn't talk due to tube   Edema    LEGS/FEET   Hypercalcemia 2009   calcium  WNL 10.2 on 04-2015   Hyperlipidemia    Obesity    Urinary incontinence     Past Surgical History:  Procedure Laterality Date   ABDOMINAL HYSTERECTOMY     one ovary remains   ANTERIOR CERVICAL DECOMP/DISCECTOMY FUSION N/A 01/31/2015   Procedure: ANTERIOR CERVICAL DECOMPRESSION/DISCECTOMY FUSION CERVICAL FIVE SIX;  Surgeon: Karie Chimera, MD;  Location: [place] [facility];  Service: Neurosurgery;  Laterality: N/A;   BACK SURGERY     C 6-C7 neck    CATARACT EXTRACTION W/PHACO Left 02/06/2017   Procedure: CATARACT EXTRACTION PHACO AND INTRAOCULAR LENS  PLACEMENT (IOC);  Surgeon: Eulogio Bear, MD;  Location: [facility] ORS;  Service: Ophthalmology;  Laterality: Left;  Lot# [id number] H [place]: 00:37.9 AP%: 7.9 CDE: 2.97   CATARACT EXTRACTION W/PHACO Right 03/06/2017   Procedure: CATARACT EXTRACTION PHACO AND INTRAOCULAR LENS PLACEMENT (IOC);  Surgeon: Eulogio Bear, MD;  Location: [facility] ORS;  Service: Ophthalmology;  Laterality: Right;  Lot #[id number] H [place]: 00.22.0 AP%: 7.5 CDE: 1.64    COLONOSCOPY     COLONOSCOPY WITH PROPOFOL N/A 09/23/2017   Procedure: COLONOSCOPY WITH PROPOFOL;  Surgeon: Toledo, Benay Pike, MD;  Location: [facility] ENDOSCOPY;  Service: Gastroenterology;  Laterality: N/A;   ELBOW SURGERY Left 04/2003   x2  & for repair & then remove all hardware, due to injury from a fall   EYE SURGERY Bilateral    cataracts   FRACTURE SURGERY Right    shoulder and arm   FRACTURE SURGERY Left 2003   elbow   HAND SURGERY Right 2006   injury- then eventually lost remainder of thumb   KNEE ARTHROSCOPY WITH LATERAL MENISECTOMY Left 01/23/2016   Procedure: KNEE ARTHROSCOPY WITH LATERAL MENISECTOMY;  Surgeon: Corky Mull, MD;  Location: [facility] ORS;  Service: Orthopedics;  Laterality: Left;   ORIF HUMERUS FRACTURE Right 08/31/2015   Procedure: OPEN REDUCTION  INTERNAL FIXATION (ORIF) PROXIMAL HUMERUS FRACTURE w/ #2 fberwire;  Surgeon: Corky Mull, MD;  Location: [facility] ORS;  Service: Orthopedics;  Laterality: Right;   THUMB AMPUTATION  2005   partial   TOTAL HIP ARTHROPLASTY Left 06/21/2020   Procedure: TOTAL HIP ARTHROPLASTY;  Surgeon: Dereck Leep, MD;  Location: [facility] ORS;  Service: Orthopedics;  Laterality: Left;    There were no vitals filed for this visit.   Subjective Assessment - 08/23/21 1119     Subjective Patinet reports 3/10 pain in R shoulder, no pain in her L shoulder. Feeling better overall. She reports compliance with HEP with no questions or concerns    Pertinent History Pt is a 76 year old female presenting with chronic bilat  shoulder pain. Reports she was in an accident in 2003 where she broke her L elbow, and subsequently had frozen shoulder, and reports she has had off and on pain since then with most recent episode of increased pain a couple months ago. Reports she had a fall 5 years ago and broke her R arm in 3 places and has had off and on pain since then in this shoulder, with most recent episode 3 months ago. Reports increased pain with insidious onset, just that she has not been moving as much. Points to pain at upper arm, inferior to GHJ that is anterior/lateral  of both Ue's, reports they feel the same but her pain can come on at different times in each arm. . Reports this pain feels like a muscle ache, and is 2/10 currently; worst pain 4/10, best 0/10. Reports pain is exacerbated by lifting/carrying, reaching behind her and overhead. Pt is R handed, retired, lives at home with her spouse in a one story home with 1 step to enter, is completing all ADLs ind with pain with bathing and dressing. She is ind with driving and community activity, previously enjoyed walking, but has difficulty doing this following hip replacement last year. Has had no falls in the past 6 months. Pt denies N/V, B&B changes, unexplained weight fluctuation, saddle paresthesia, fever, night sweats, or unrelenting night pain at this time.    Limitations Writing;Lifting;House hold activities    How long can you sit comfortably? unlimited    How long can you stand comfortably? unilimited    How long can you walk comfortably? unlimited by shoulders; due to hip replacement and fear of falling only 5 mins    Diagnostic tests none to date    Patient Stated Goals decrease shoulder pain, and increase mobility in bilat shoulders    Pain Onset More than a month ago             Ther-Ex Nustep seat 8 UE 9 L4 with cuing for scapular retraction/protraction with LE assist only SeatedThoracic Lumbar Extension over towel roll with overhead dowel scaption  x12 with min cuing for initial technique with good carry over Standing perpendicular to wall with ball between hip and wall rotations with hand on wall x12 bilat with cuing to follow hand with eyes" for increased thoracolumbar rotation with success Good morning with dowel overhead (unable to complete with back squat position) 2x 12 with minimal hip hinge availible maintaining dowel overhead and neutral spine (approx 40d) with consistent cuing needed for technique with good carry over Bent over rows 5# DB 3x 10 with min cuing for initial set up with good carry over Seated scaption with RTB resistance pull apart 2x 10 with good carry over supine scapulohumeral rhythm  Post shoulder rolls x15 with focus on scapular retraction + depression with success                               PT Education - 08/23/21 1129     Education Details therex form/technique    Person(s) Educated Patient    Methods Explanation;Demonstration;Verbal cues    Comprehension Verbalized understanding;Returned demonstration;Verbal cues required              PT Short Term Goals - 08/10/21 1045       PT SHORT TERM GOAL #1   Title Pt will be independent with HEP in order to improve strength and decrease pain in order to improve pain-free function at home and work.    Baseline 08/10/21 HEP given    Time 4    Period Weeks    Status New               PT Long Term Goals - 08/10/21 1045       PT LONG TERM GOAL #1   Title Patient will demonstrate full bilat shoulder AROM in order to complete self care ADLs    Baseline 08/09/21 R/L flexion 146/143; abd 140/142; ER C6/C2; IR R PSIS/L PSIS    Time 8    Period Weeks    Status New      PT LONG TERM GOAL #2   Title Pt will decrease worst pain as reported on NPRS by at least 3 points in order to demonstrate clinically significant reduction in pain.    Baseline 08/09/21 4/10    Time 8    Period Weeks    Status New      PT LONG TERM GOAL  #3   Title Pt will increase periscapular strength of  to at least 4/5 MMT grade in order to demonstrate improvement in strength and function    Baseline 08/09/21 Y lower trap 3+/5 bilat; T scap retractors 4-/5 bilat    Time 8    Period Weeks    Status New      PT LONG TERM GOAL #4   Title Patient will increase FOTO score to 65 to demonstrate predicted increase in functional mobility to complete ADLs    Baseline 08/09/21 42    Time 8    Period Weeks    Status New                   Plan - 08/23/21 1152     Clinical Impression Statement PT continued therex progression for increased extensor mobility and shoulder/periscapular strengthening and stability with success. Patient is able to comply with all cuing of proper technique of therex with some modifications needed, with excellent motivation throughout session. Patien twith difficulty with back squat barbell position d/t lack of bilat shoulder mobility, demonstrating continued need for compliance with scapulothoracic and shoulder mobility HEP and training throughout sessions. PT will continue progression as able.    Personal Factors and Comorbidities Comorbidity 3+;Past/Current Experience;Time since onset of injury/illness/exacerbation;Fitness    Comorbidities COPD, arthritis, HLD, chronic pain    Examination-Activity Limitations Reach Overhead;Lift;Carry;Hygiene/Grooming;Dressing    Examination-Participation Restrictions Cleaning;Community Activity;Driving;Meal Prep    Stability/Clinical Decision Making Evolving/Moderate complexity    Clinical Decision Making Moderate    Rehab Potential Good    PT Frequency 2x / week    PT Duration 8 weeks    PT Treatment/Interventions Electrical Stimulation;Moist Heat;DME Instruction;Traction;Ultrasound;Cryotherapy;Iontophoresis 4mg /ml Dexamethasone;Therapeutic exercise;Functional mobility training;Therapeutic  activities;Neuromuscular re-education;Manual techniques;Patient/family  education;Passive range of motion;Dry needling;Splinting;Taping;Joint Manipulations;Spinal Manipulations    PT Next Visit Plan HEP review, posture education, periscapular strengthening    PT Home Exercise Plan scapular retractions, doorway stretch    Consulted and Agree with Plan of Care Patient             Patient will benefit from skilled therapeutic intervention in order to improve the following deficits and impairments:  Decreased activity tolerance, Decreased endurance, Decreased strength, Increased fascial restricitons, Impaired UE functional use, Improper body mechanics, Pain, Postural dysfunction, Impaired tone, Impaired flexibility, Decreased mobility, Decreased safety awareness  Visit Diagnosis: Stiffness of left shoulder, not elsewhere classified  Stiffness of right shoulder, not elsewhere classified     Problem List Patient Active Problem List   Diagnosis Date Noted   Stiffness of left shoulder, not elsewhere classified 08/15/2021   Hx of total hip arthroplasty, left 06/21/2020   COPD with asthma ([place]) 06/18/2020   Hyperglycemia 06/18/2020   Hyperlipidemia 06/18/2020   Urinary incontinence, mixed 06/18/2020   Pulmonary nodules 06/07/2020   Avascular necrosis of femoral head, left ([place]) 04/02/2020   Primary osteoarthritis of left hip 04/02/2020   Numbness 08/19/2019   Ataxia 07/15/2019   Bilateral carpal tunnel syndrome 07/15/2019   Obstructive sleep apnea 06/19/2017   BMI 40.0-44.9, adult ([place]) 04/29/2016   Primary osteoarthritis of left knee 01/19/2016   Rotator cuff tendinitis, left 11/20/2015   Complex tear of lateral meniscus of right knee as current injury 11/20/2015   Primary osteoarthritis of right knee 11/20/2015   Essential hypertension 10/19/2015   Chronic pain of both shoulders 07/20/2015   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   Neuropathy 04/13/2014   Vitamin D deficiency 10/15/2013   Durwin Reges DPT Durwin Reges, PT 08/23/2021, 3:34  PM  [facility] [address], [name], [address] Phone: [phone]   Fax:  [phone]  Name: Laura Hale MRN: [id number] Date of Birth: 05-Apr-1946

## 2021-08-27 ENCOUNTER — Ambulatory Visit: Payer: PPO | Admitting: Physical Therapy

## 2021-08-29 ENCOUNTER — Encounter: Payer: Self-pay | Admitting: Physical Therapy

## 2021-08-29 ENCOUNTER — Ambulatory Visit: Payer: PPO | Admitting: Physical Therapy

## 2021-08-29 DIAGNOSIS — M25611 Stiffness of right shoulder, not elsewhere classified: Secondary | ICD-10-CM

## 2021-08-29 DIAGNOSIS — M546 Pain in thoracic spine: Secondary | ICD-10-CM

## 2021-08-29 DIAGNOSIS — M25612 Stiffness of left shoulder, not elsewhere classified: Secondary | ICD-10-CM | POA: Diagnosis not present

## 2021-08-29 NOTE — Therapy (Signed)
[facility] [address], [name], [address] Phone: [phone]   Fax:  [phone]  Physical Therapy Treatment  Patient Details  Name: Laura Hale MRN: [id number] Date of Birth: [birth year] Referring Provider (PT): Caryl Comes [facility]   Encounter Date: 08/29/2021   PT End of Session - 08/29/21 0953     Visit Number 6    Number of Visits 17    Date for PT Re-Evaluation 10/05/21    Authorization - Visit Number 6    Authorization - Number of Visits 10    PT Start Time 0945    PT Stop Time 1025    PT Time Calculation (min) 40 min    Activity Tolerance Patient tolerated treatment well    Behavior During Therapy [facility] for tasks assessed/performed             Past Medical History:  Diagnosis Date   Arthritis    elbow, hands, neck    Asthma    allergy induced asthma-only exacerbated by smells like perfume, smoke etc   Atypical childhood psychosis 2007   myoview -negative for ischemia with preserved LV function   Basal cell carcinoma ~2006   nasal tip   Colon polyps    Complication of anesthesia    WITH ACDF IN JUNE 2016-PT [place] X 10 WEEKS DUE TO INTUBATION   COPD (chronic obstructive pulmonary disease) ([place])    Difficult intubation    with last procedure.  couldn't talk due to tube   Edema    LEGS/FEET   Hypercalcemia 2009   calcium  WNL 10.2 on 04-2015   Hyperlipidemia    Obesity    Urinary incontinence     Past Surgical History:  Procedure Laterality Date   ABDOMINAL HYSTERECTOMY     one ovary remains   ANTERIOR CERVICAL DECOMP/DISCECTOMY FUSION N/A 01/31/2015   Procedure: ANTERIOR CERVICAL DECOMPRESSION/DISCECTOMY FUSION CERVICAL FIVE SIX;  Surgeon: Karie Chimera, MD;  Location: [place] [facility];  Service: Neurosurgery;  Laterality: N/A;   BACK SURGERY     C 6-C7 neck    CATARACT EXTRACTION W/PHACO Left 02/06/2017   Procedure: CATARACT EXTRACTION PHACO AND INTRAOCULAR LENS  PLACEMENT (IOC);  Surgeon: Eulogio Bear, MD;  Location: [facility] ORS;  Service: Ophthalmology;  Laterality: Left;  Lot# [id number] H [place]: 00:37.9 AP%: 7.9 CDE: 2.97   CATARACT EXTRACTION W/PHACO Right 03/06/2017   Procedure: CATARACT EXTRACTION PHACO AND INTRAOCULAR LENS PLACEMENT (IOC);  Surgeon: Eulogio Bear, MD;  Location: [facility] ORS;  Service: Ophthalmology;  Laterality: Right;  Lot #[id number] H [place]: 00.22.0 AP%: 7.5 CDE: 1.64    COLONOSCOPY     COLONOSCOPY WITH PROPOFOL N/A 09/23/2017   Procedure: COLONOSCOPY WITH PROPOFOL;  Surgeon: Toledo, Benay Pike, MD;  Location: [facility];  Service: Gastroenterology;  Laterality: N/A;   ELBOW SURGERY Left 04/2003   x2  & for repair & then remove all hardware, due to injury from a fall   EYE SURGERY Bilateral    cataracts   FRACTURE SURGERY Right    shoulder and arm   FRACTURE SURGERY Left 2003   elbow   HAND SURGERY Right 2006   injury- then eventually lost remainder of thumb   KNEE ARTHROSCOPY WITH LATERAL MENISECTOMY Left 01/23/2016   Procedure: KNEE ARTHROSCOPY WITH LATERAL MENISECTOMY;  Surgeon: Corky Mull, MD;  Location: [facility];  Service: Orthopedics;  Laterality: Left;   ORIF HUMERUS FRACTURE Right 08/31/2015   Procedure: OPEN REDUCTION  INTERNAL FIXATION (ORIF) PROXIMAL HUMERUS FRACTURE w/ #2 fberwire;  Surgeon: Corky Mull, MD;  Location: [facility] ORS;  Service: Orthopedics;  Laterality: Right;   THUMB AMPUTATION  2005   partial   TOTAL HIP ARTHROPLASTY Left 06/21/2020   Procedure: TOTAL HIP ARTHROPLASTY;  Surgeon: Dereck Leep, MD;  Location: [facility] ORS;  Service: Orthopedics;  Laterality: Left;    There were no vitals filed for this visit.   Subjective Assessment - 08/29/21 0949     Subjective Continues to report some R shoulder soreness 2/10 but that it is only feeling this way 50% of the time which is an improvement. No pain in L shoulder. Feeling good overall, and completing HEP.    Pertinent History Pt is a 76 year old  female presenting with chronic bilat shoulder pain. Reports she was in an accident in 2003 where she broke her L elbow, and subsequently had frozen shoulder, and reports she has had off and on pain since then with most recent episode of increased pain a couple months ago. Reports she had a fall 5 years ago and broke her R arm in 3 places and has had off and on pain since then in this shoulder, with most recent episode 3 months ago. Reports increased pain with insidious onset, just that she has not been moving as much. Points to pain at upper arm, inferior to GHJ that is anterior/lateral  of both Ue's, reports they feel the same but her pain can come on at different times in each arm. . Reports this pain feels like a muscle ache, and is 2/10 currently; worst pain 4/10, best 0/10. Reports pain is exacerbated by lifting/carrying, reaching behind her and overhead. Pt is R handed, retired, lives at home with her spouse in a one story home with 1 step to enter, is completing all ADLs ind with pain with bathing and dressing. She is ind with driving and community activity, previously enjoyed walking, but has difficulty doing this following hip replacement last year. Has had no falls in the past 6 months. Pt denies N/V, B&B changes, unexplained weight fluctuation, saddle paresthesia, fever, night sweats, or unrelenting night pain at this time.    Limitations Writing;Lifting;House hold activities    How long can you sit comfortably? unlimited    How long can you stand comfortably? unilimited    How long can you walk comfortably? unlimited by shoulders; due to hip replacement and fear of falling only 5 mins    Diagnostic tests none to date    Patient Stated Goals decrease shoulder pain, and increase mobility in bilat shoulders    Pain Onset More than a month ago                Ther-Ex Nustep seat 8 UE 9 L4 with cuing for scapular retraction/protraction with LE assist only Standing Thoracic Lumbar Extension  over cubicle lip hands behind head x12 with min cuing for initial technique with good carry over Standing perpendicular to wall with ball between hip and wall rotations with hand on wall x12 bilat with cuing to follow hand with eyes" for increased thoracolumbar rotation with success Good morning with dowel overhead (unable to complete with back squat position) 2x 12 with most cuing needed for final 3-4 reps to maintain technique Bent over flys 2# DB 3x 10 with min cuing for technique throughout,  Bent over rows OMEGA 5# DB 3x 10 with min cuing for initial set up with good carry over Seated  scaption with RTB resistance pull apart 2x 10 with good carry over supine scapulohumeral rhythm Post shoulder rolls x15 with focus on scapular retraction + depression with success                          PT Education - 08/29/21 0953     Education Details therex form/technique    Person(s) Educated Patient    Methods Explanation;Demonstration;Verbal cues    Comprehension Verbalized understanding;Returned demonstration;Verbal cues required              PT Short Term Goals - 08/10/21 1045       PT SHORT TERM GOAL #1   Title Pt will be independent with HEP in order to improve strength and decrease pain in order to improve pain-free function at home and work.    Baseline 08/10/21 HEP given    Time 4    Period Weeks    Status New               PT Long Term Goals - 08/10/21 1045       PT LONG TERM GOAL #1   Title Patient will demonstrate full bilat shoulder AROM in order to complete self care ADLs    Baseline 08/09/21 R/L flexion 146/143; abd 140/142; ER C6/C2; IR R PSIS/L PSIS    Time 8    Period Weeks    Status New      PT LONG TERM GOAL #2   Title Pt will decrease worst pain as reported on NPRS by at least 3 points in order to demonstrate clinically significant reduction in pain.    Baseline 08/09/21 4/10    Time 8    Period Weeks    Status New      PT LONG  TERM GOAL #3   Title Pt will increase periscapular strength of  to at least 4/5 MMT grade in order to demonstrate improvement in strength and function    Baseline 08/09/21 Y lower trap 3+/5 bilat; T scap retractors 4-/5 bilat    Time 8    Period Weeks    Status New      PT LONG TERM GOAL #4   Title Patient will increase FOTO score to 65 to demonstrate predicted increase in functional mobility to complete ADLs    Baseline 08/09/21 42    Time 8    Period Weeks    Status New                   Plan - 08/29/21 1021     Clinical Impression Statement PT continued therex progression for increased extensor mobility and strength with success. PT continued progressions in gravity dependent therex for increased endurance of thoracic and lumbar extensors with periscapular strengthening with patient able to comply with multimodal cuing for proper technique. PT educated patien ton functional lifting, lowering herself with squat or deadlift technique and using scapular retractors to bring items toward your COM before standing with good understanding. Pt rpeorts no increased pain throughout session. PT will continue progression as able.    Personal Factors and Comorbidities Comorbidity 3+;Past/Current Experience;Time since onset of injury/illness/exacerbation;Fitness    Comorbidities COPD, arthritis, HLD, chronic pain    Examination-Activity Limitations Reach Overhead;Lift;Carry;Hygiene/Grooming;Dressing    Examination-Participation Restrictions Cleaning;Community Activity;Driving;Meal Prep    Stability/Clinical Decision Making Evolving/Moderate complexity    Clinical Decision Making Moderate    Rehab Potential Good    PT Frequency 2x / week  PT Duration 8 weeks    PT Treatment/Interventions Electrical Stimulation;Moist Heat;DME Instruction;Traction;Ultrasound;Cryotherapy;Iontophoresis 4mg /ml Dexamethasone;Therapeutic exercise;Functional mobility training;Therapeutic activities;Neuromuscular  re-education;Manual techniques;Patient/family education;Passive range of motion;Dry needling;Splinting;Taping;Joint Manipulations;Spinal Manipulations    PT Next Visit Plan HEP review, posture education, periscapular strengthening    PT Home Exercise Plan scapular retractions, doorway stretch    Consulted and Agree with Plan of Care Patient             Patient will benefit from skilled therapeutic intervention in order to improve the following deficits and impairments:  Decreased activity tolerance, Decreased endurance, Decreased strength, Increased fascial restricitons, Impaired UE functional use, Improper body mechanics, Pain, Postural dysfunction, Impaired tone, Impaired flexibility, Decreased mobility, Decreased safety awareness  Visit Diagnosis: Stiffness of left shoulder, not elsewhere classified  Stiffness of right shoulder, not elsewhere classified  Pain in thoracic spine     Problem List Patient Active Problem List   Diagnosis Date Noted   Stiffness of left shoulder, not elsewhere classified 08/15/2021   Hx of total hip arthroplasty, left 06/21/2020   COPD with asthma ([place]) 06/18/2020   Hyperglycemia 06/18/2020   Hyperlipidemia 06/18/2020   Urinary incontinence, mixed 06/18/2020   Pulmonary nodules 06/07/2020   Avascular necrosis of femoral head, left ([place]) 04/02/2020   Primary osteoarthritis of left hip 04/02/2020   Numbness 08/19/2019   Ataxia 07/15/2019   Bilateral carpal tunnel syndrome 07/15/2019   Obstructive sleep apnea 06/19/2017   BMI 40.0-44.9, adult ([place]) 04/29/2016   Primary osteoarthritis of left knee 01/19/2016   Rotator cuff tendinitis, left 11/20/2015   Complex tear of lateral meniscus of right knee as current injury 11/20/2015   Primary osteoarthritis of right knee 11/20/2015   Essential hypertension 10/19/2015   Chronic pain of both shoulders 07/20/2015   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   Neuropathy 04/13/2014   Vitamin D deficiency  10/15/2013   Durwin Reges DPT Durwin Reges, PT 08/29/2021, 10:25 AM  [facility] [address] [name], [address] Phone: [phone]   Fax:  [phone]  Name: Laura Hale MRN: [id number] Date of Birth: Nov 23, 1945

## 2021-08-31 ENCOUNTER — Encounter: Payer: Self-pay | Admitting: Physical Therapy

## 2021-08-31 ENCOUNTER — Ambulatory Visit: Payer: PPO | Admitting: Physical Therapy

## 2021-08-31 DIAGNOSIS — M546 Pain in thoracic spine: Secondary | ICD-10-CM

## 2021-08-31 DIAGNOSIS — M25612 Stiffness of left shoulder, not elsewhere classified: Secondary | ICD-10-CM

## 2021-08-31 DIAGNOSIS — M25611 Stiffness of right shoulder, not elsewhere classified: Secondary | ICD-10-CM

## 2021-08-31 NOTE — Therapy (Signed)
[place] [facility] [address], [name], [address] Phone: [phone]   Fax:  [phone]  Physical Therapy Treatment  Patient Details  Name: Laura Hale MRN: [id number] Date of Birth: 02/07/1946 Referring Provider (PT): Caryl Comes [facility]   Encounter Date: 08/31/2021   PT End of Session - 08/31/21 0856     Visit Number 7    Number of Visits 17    Date for PT Re-Evaluation 10/05/21    Authorization - Visit Number 7    Authorization - Number of Visits 10    PT Start Time 0800    PT Stop Time 0845    PT Time Calculation (min) 45 min    Activity Tolerance Patient tolerated treatment well    Behavior During Therapy [facility] for tasks assessed/performed             Past Medical History:  Diagnosis Date   Arthritis    elbow, hands, neck    Asthma    allergy induced asthma-only exacerbated by smells like perfume, smoke etc   Atypical childhood psychosis 2007   myoview -negative for ischemia with preserved LV function   Basal cell carcinoma ~2006   nasal tip   Colon polyps    Complication of anesthesia    WITH ACDF IN JUNE 2016-PT [place] X 10 WEEKS DUE TO INTUBATION   COPD (chronic obstructive pulmonary disease) ([place])    Difficult intubation    with last procedure.  couldn't talk due to tube   Edema    LEGS/FEET   Hypercalcemia 2009   calcium  WNL 10.2 on 04-2015   Hyperlipidemia    Obesity    Urinary incontinence     Past Surgical History:  Procedure Laterality Date   ABDOMINAL HYSTERECTOMY     one ovary remains   ANTERIOR CERVICAL DECOMP/DISCECTOMY FUSION N/A 01/31/2015   Procedure: ANTERIOR CERVICAL DECOMPRESSION/DISCECTOMY FUSION CERVICAL FIVE SIX;  Surgeon: Karie Chimera, MD;  Location: [place] [facility];  Service: Neurosurgery;  Laterality: N/A;   BACK SURGERY     C 6-C7 neck    CATARACT EXTRACTION W/PHACO Left 02/06/2017   Procedure: CATARACT EXTRACTION PHACO AND INTRAOCULAR LENS  PLACEMENT (IOC);  Surgeon: Eulogio Bear, MD;  Location: [facility] ORS;  Service: Ophthalmology;  Laterality: Left;  Lot# [id number] H [place]: 00:37.9 AP%: 7.9 CDE: 2.97   CATARACT EXTRACTION W/PHACO Right 03/06/2017   Procedure: CATARACT EXTRACTION PHACO AND INTRAOCULAR LENS PLACEMENT (IOC);  Surgeon: Eulogio Bear, MD;  Location: [facility] ORS;  Service: Ophthalmology;  Laterality: Right;  Lot #[id number] H [place]: 00.22.0 AP%: 7.5 CDE: 1.64    COLONOSCOPY     COLONOSCOPY WITH PROPOFOL N/A 09/23/2017   Procedure: COLONOSCOPY WITH PROPOFOL;  Surgeon: Toledo, Benay Pike, MD;  Location: [facility];  Service: Gastroenterology;  Laterality: N/A;   ELBOW SURGERY Left 04/2003   x2  & for repair & then remove all hardware, due to injury from a fall   EYE SURGERY Bilateral    cataracts   FRACTURE SURGERY Right    shoulder and arm   FRACTURE SURGERY Left 2003   elbow   HAND SURGERY Right 2006   injury- then eventually lost remainder of thumb   KNEE ARTHROSCOPY WITH LATERAL MENISECTOMY Left 01/23/2016   Procedure: KNEE ARTHROSCOPY WITH LATERAL MENISECTOMY;  Surgeon: Corky Mull, MD;  Location: [facility] ORS;  Service: Orthopedics;  Laterality: Left;   ORIF HUMERUS FRACTURE Right 08/31/2015   Procedure: OPEN REDUCTION  INTERNAL FIXATION (ORIF) PROXIMAL HUMERUS FRACTURE w/ #2 fberwire;  Surgeon: Corky Mull, MD;  Location: [facility] ORS;  Service: Orthopedics;  Laterality: Right;   THUMB AMPUTATION  2005   partial   TOTAL HIP ARTHROPLASTY Left 06/21/2020   Procedure: TOTAL HIP ARTHROPLASTY;  Surgeon: Dereck Leep, MD;  Location: [facility] ORS;  Service: Orthopedics;  Laterality: Left;    There were no vitals filed for this visit.   Subjective Assessment - 08/31/21 0810     Subjective pnt feeling sore from sleeping in a weird position. pnt reports right shoulder is 3/10 and left 2/10 on NPS. pnt reports that she is completing HEP at home.    Pertinent History Pt is a 76 year old female presenting with chronic bilat  shoulder pain. Reports she was in an accident in 2003 where she broke her L elbow, and subsequently had frozen shoulder, and reports she has had off and on pain since then with most recent episode of increased pain a couple months ago. Reports she had a fall 5 years ago and broke her R arm in 3 places and has had off and on pain since then in this shoulder, with most recent episode 3 months ago. Reports increased pain with insidious onset, just that she has not been moving as much. Points to pain at upper arm, inferior to GHJ that is anterior/lateral  of both Ue's, reports they feel the same but her pain can come on at different times in each arm. . Reports this pain feels like a muscle ache, and is 2/10 currently; worst pain 4/10, best 0/10. Reports pain is exacerbated by lifting/carrying, reaching behind her and overhead. Pt is R handed, retired, lives at home with her spouse in a one story home with 1 step to enter, is completing all ADLs ind with pain with bathing and dressing. She is ind with driving and community activity, previously enjoyed walking, but has difficulty doing this following hip replacement last year. Has had no falls in the past 6 months. Pt denies N/V, B&B changes, unexplained weight fluctuation, saddle paresthesia, fever, night sweats, or unrelenting night pain at this time.    Limitations Writing;Lifting;House hold activities    How long can you sit comfortably? unlimited    How long can you stand comfortably? unilimited    How long can you walk comfortably? unlimited by shoulders; due to hip replacement and fear of falling only 5 mins    Diagnostic tests none to date    Patient Stated Goals decrease shoulder pain, and increase mobility in bilat shoulders    Currently in Pain? Yes    Pain Score 3     Pain Location Arm    Pain Orientation Right;Left;Upper    Pain Descriptors / Indicators Aching    Pain Type Chronic pain    Pain Radiating Towards none    Pain Onset More than a  month ago    Pain Frequency Constant    Aggravating Factors  reaching overhead or behind back, lifting    Pain Relieving Factors rest    Effect of Pain on Daily Activities unable to complete ADLs without pain                             Ther-Ex Nustep seat 8 UE 9 L4 with cuing for scapular retraction/protraction with LE assist only for 5 min Standing Thoracic Lumbar Extension over cubicle lip hands behind head x12 with  min cuing for initial technique with good carry over Wall angels 3 x8- cueing to keep hands toward the wall in order to maintain neutral spine  Standing retraction BTB- cueing for scapular retraction 3x10 Low rows BTB - cueing to engage TA in order to maintain neutral spine 3x10  Scaption with #2 weight  -cueing for eccentric control on the way down 3x10                  PT Education - 08/31/21 0855     Education Details therex form and anatomy    Person(s) Educated Patient    Methods Explanation;Demonstration;Tactile cues    Comprehension Verbalized understanding;Returned demonstration;Tactile cues required              PT Short Term Goals - 08/10/21 1045       PT SHORT TERM GOAL #1   Title Pt will be independent with HEP in order to improve strength and decrease pain in order to improve pain-free function at home and work.    Baseline 08/10/21 HEP given    Time 4    Period Weeks    Status New               PT Long Term Goals - 08/10/21 1045       PT LONG TERM GOAL #1   Title Patient will demonstrate full bilat shoulder AROM in order to complete self care ADLs    Baseline 08/09/21 R/L flexion 146/143; abd 140/142; ER C6/C2; IR R PSIS/L PSIS    Time 8    Period Weeks    Status New      PT LONG TERM GOAL #2   Title Pt will decrease worst pain as reported on NPRS by at least 3 points in order to demonstrate clinically significant reduction in pain.    Baseline 08/09/21 4/10    Time 8    Period Weeks    Status  New      PT LONG TERM GOAL #3   Title Pt will increase periscapular strength of  to at least 4/5 MMT grade in order to demonstrate improvement in strength and function    Baseline 08/09/21 Y lower trap 3+/5 bilat; T scap retractors 4-/5 bilat    Time 8    Period Weeks    Status New      PT LONG TERM GOAL #4   Title Patient will increase FOTO score to 65 to demonstrate predicted increase in functional mobility to complete ADLs    Baseline 08/09/21 42    Time 8    Period Weeks    Status New                   Plan - 08/31/21 0857     Clinical Impression Statement Pnt presented with improved AROM in bilateral shoulders . Impairments include MMT deficits in scapular retractors  . Pnt showed tolerance to exercise progressions with tactile and verbal cues to enforce proper form and sequencing with good motivation. Pnt verbalized and demonstrated understanding of PT directions and has good rehab potential. Further strength training will be required to address listed deficits to achieve increased ROM, decreased pain, and increased MMT. PT will continue progression as able    Personal Factors and Comorbidities Comorbidity 3+;Past/Current Experience;Time since onset of injury/illness/exacerbation;Fitness    Comorbidities COPD, arthritis, HLD, chronic pain    Examination-Activity Limitations Reach Overhead;Lift;Carry;Hygiene/Grooming;Dressing    Examination-Participation Restrictions Cleaning;Community Activity;Driving;Meal Prep    Stability/Clinical Decision  Making Evolving/Moderate complexity    Clinical Decision Making Moderate    Rehab Potential Good    PT Frequency 2x / week    PT Duration 8 weeks    PT Treatment/Interventions Electrical Stimulation;Moist Heat;DME Instruction;Traction;Ultrasound;Cryotherapy;Iontophoresis 4mg /ml Dexamethasone;Therapeutic exercise;Functional mobility training;Therapeutic activities;Neuromuscular re-education;Manual techniques;Patient/family  education;Passive range of motion;Dry needling;Splinting;Taping;Joint Manipulations;Spinal Manipulations    PT Next Visit Plan HEP review, posture education, periscapular strengthening    PT Home Exercise Plan scapular retractions with BTB    Consulted and Agree with Plan of Care Patient             Patient will benefit from skilled therapeutic intervention in order to improve the following deficits and impairments:  Decreased activity tolerance, Decreased endurance, Decreased strength, Increased fascial restricitons, Impaired UE functional use, Improper body mechanics, Pain, Postural dysfunction, Impaired tone, Impaired flexibility, Decreased mobility, Decreased safety awareness  Visit Diagnosis: Stiffness of left shoulder, not elsewhere classified  Stiffness of right shoulder, not elsewhere classified  Pain in thoracic spine     Problem List Patient Active Problem List   Diagnosis Date Noted   Stiffness of left shoulder, not elsewhere classified 08/15/2021   Hx of total hip arthroplasty, left 06/21/2020   COPD with asthma ([place]) 06/18/2020   Hyperglycemia 06/18/2020   Hyperlipidemia 06/18/2020   Urinary incontinence, mixed 06/18/2020   Pulmonary nodules 06/07/2020   Avascular necrosis of femoral head, left ([place]) 04/02/2020   Primary osteoarthritis of left hip 04/02/2020   Numbness 08/19/2019   Ataxia 07/15/2019   Bilateral carpal tunnel syndrome 07/15/2019   Obstructive sleep apnea 06/19/2017   BMI 40.0-44.9, adult ([place]) 04/29/2016   Primary osteoarthritis of left knee 01/19/2016   Rotator cuff tendinitis, left 11/20/2015   Complex tear of lateral meniscus of right knee as current injury 11/20/2015   Primary osteoarthritis of right knee 11/20/2015   Essential hypertension 10/19/2015   Chronic pain of both shoulders 07/20/2015   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   Neuropathy 04/13/2014   Vitamin D deficiency 10/15/2013   Aletha Halim, SPT Durwin Reges,  PT 08/31/2021, 10:36 AM  [place] [facility] [address] [name], [address] Phone: [phone]   Fax:  [phone]  Name: RIVEN BEEBE MRN: [id number] Date of Birth: 1946/04/10

## 2021-09-03 ENCOUNTER — Encounter: Payer: PPO | Admitting: Physical Therapy

## 2021-09-05 ENCOUNTER — Encounter: Payer: PPO | Admitting: Physical Therapy

## 2021-09-07 ENCOUNTER — Ambulatory Visit: Payer: PPO | Admitting: Physical Therapy

## 2021-09-07 ENCOUNTER — Other Ambulatory Visit: Payer: Self-pay

## 2021-09-07 ENCOUNTER — Encounter: Payer: Self-pay | Admitting: Physical Therapy

## 2021-09-07 DIAGNOSIS — M25612 Stiffness of left shoulder, not elsewhere classified: Secondary | ICD-10-CM | POA: Diagnosis not present

## 2021-09-07 DIAGNOSIS — M25611 Stiffness of right shoulder, not elsewhere classified: Secondary | ICD-10-CM

## 2021-09-07 NOTE — Therapy (Signed)
[place] [facility] [address], [name], [address] Phone: [phone]   Fax:  [phone]  Physical Therapy Treatment  Patient Details  Name: Laura Hale MRN: [id number] Date of Birth: May 18, 1946 Referring Provider (PT): Caryl Comes [facility]   Encounter Date: 09/07/2021   PT End of Session - 09/07/21 0930     Visit Number 8    Number of Visits 17    Date for PT Re-Evaluation 10/05/21    Authorization - Visit Number 8    Authorization - Number of Visits 10    PT Start Time 0845    PT Stop Time 0930    PT Time Calculation (min) 45 min    Activity Tolerance Patient tolerated treatment well    Behavior During Therapy [facility] for tasks assessed/performed             Past Medical History:  Diagnosis Date   Arthritis    elbow, hands, neck    Asthma    allergy induced asthma-only exacerbated by smells like perfume, smoke etc   Atypical childhood psychosis 2007   myoview -negative for ischemia with preserved LV function   Basal cell carcinoma ~2006   nasal tip   Colon polyps    Complication of anesthesia    WITH ACDF IN JUNE 2016-PT STATES SHE LOST HER VOICE X 10 WEEKS DUE TO INTUBATION   COPD (chronic obstructive pulmonary disease) ([place])    Difficult intubation    with last procedure.  couldn't talk due to tube   Edema    LEGS/FEET   Hypercalcemia 2009   calcium  WNL 10.2 on 04-2015   Hyperlipidemia    Obesity    Urinary incontinence     Past Surgical History:  Procedure Laterality Date   ABDOMINAL HYSTERECTOMY     one ovary remains   ANTERIOR CERVICAL DECOMP/DISCECTOMY FUSION N/A 01/31/2015   Procedure: ANTERIOR CERVICAL DECOMPRESSION/DISCECTOMY FUSION CERVICAL FIVE SIX;  Surgeon: Karie Chimera, MD;  Location: [place] [facility];  Service: Neurosurgery;  Laterality: N/A;   BACK SURGERY     C 6-C7 neck    CATARACT EXTRACTION W/PHACO Left 02/06/2017   Procedure: CATARACT EXTRACTION PHACO AND INTRAOCULAR LENS  PLACEMENT (IOC);  Surgeon: Eulogio Bear, MD;  Location: [facility] ORS;  Service: Ophthalmology;  Laterality: Left;  Lot# [id number] H [place]: 00:37.9 AP%: 7.9 CDE: 2.97   CATARACT EXTRACTION W/PHACO Right 03/06/2017   Procedure: CATARACT EXTRACTION PHACO AND INTRAOCULAR LENS PLACEMENT (IOC);  Surgeon: Eulogio Bear, MD;  Location: [facility];  Service: Ophthalmology;  Laterality: Right;  Lot #[id number] H [place]: 00.22.0 AP%: 7.5 CDE: 1.64    COLONOSCOPY     COLONOSCOPY WITH PROPOFOL N/A 09/23/2017   Procedure: COLONOSCOPY WITH PROPOFOL;  Surgeon: Toledo, Benay Pike, MD;  Location: [facility];  Service: Gastroenterology;  Laterality: N/A;   ELBOW SURGERY Left 04/2003   x2  & for repair & then remove all hardware, due to injury from a fall   EYE SURGERY Bilateral    cataracts   FRACTURE SURGERY Right    shoulder and arm   FRACTURE SURGERY Left 2003   elbow   HAND SURGERY Right 2006   injury- then eventually lost remainder of thumb   KNEE ARTHROSCOPY WITH LATERAL MENISECTOMY Left 01/23/2016   Procedure: KNEE ARTHROSCOPY WITH LATERAL MENISECTOMY;  Surgeon: Corky Mull, MD;  Location: [facility];  Service: Orthopedics;  Laterality: Left;   ORIF HUMERUS FRACTURE Right 08/31/2015   Procedure: OPEN REDUCTION  INTERNAL FIXATION (ORIF) PROXIMAL HUMERUS FRACTURE w/ #2 fberwire;  Surgeon: Corky Mull, MD;  Location: [facility]S;  Service: Orthopedics;  Laterality: Right;   THUMB AMPUTATION  2005   partial   TOTAL HIP ARTHROPLASTY Left 06/21/2020   Procedure: TOTAL HIP ARTHROPLASTY;  Surgeon: Dereck Leep, MD;  Location: [facility];  Service: Orthopedics;  Laterality: Left;    There were no vitals filed for this visit.   Subjective Assessment - 09/07/21 0846     Subjective pnt feeling good after a busy week. pnt reports 2/10 pain in R shoulder and no pain in L shoulder    Pertinent History Pt is a 76 year old female presenting with chronic bilat shoulder pain. Reports she was in an accident in 2003 where  she broke her L elbow, and subsequently had frozen shoulder, and reports she has had off and on pain since then with most recent episode of increased pain a couple months ago. Reports she had a fall 5 years ago and broke her R arm in 3 places and has had off and on pain since then in this shoulder, with most recent episode 3 months ago. Reports increased pain with insidious onset, just that she has not been moving as much. Points to pain at upper arm, inferior to GHJ that is anterior/lateral  of both Ue's, reports they feel the same but her pain can come on at different times in each arm. . Reports this pain feels like a muscle ache, and is 2/10 currently; worst pain 4/10, best 0/10. Reports pain is exacerbated by lifting/carrying, reaching behind her and overhead. Pt is R handed, retired, lives at home with her spouse in a one story home with 1 step to enter, is completing all ADLs ind with pain with bathing and dressing. She is ind with driving and community activity, previously enjoyed walking, but has difficulty doing this following hip replacement last year. Has had no falls in the past 6 months. Pt denies N/V, B&B changes, unexplained weight fluctuation, saddle paresthesia, fever, night sweats, or unrelenting night pain at this time.    Limitations Writing;Lifting;House hold activities    How long can you sit comfortably? unlimited    How long can you stand comfortably? unilimited    How long can you walk comfortably? unlimited by shoulders; due to hip replacement and fear of falling only 5 mins    Diagnostic tests none to date    Patient Stated Goals decrease shoulder pain, and increase mobility in bilat shoulders    Currently in Pain? Yes    Pain Score 2     Pain Location Arm    Pain Orientation Right;Upper    Pain Descriptors / Indicators Aching    Pain Type Chronic pain    Pain Onset More than a month ago    Pain Frequency Constant              Ther-Ex Nustep seat 8 UE 9 L4 with  cuing for scapular retraction/protraction with LE assist only for 5 min Wall angels 2 x8- cueing to keep hands toward the wall in order to maintain neutral spine  Wall Slides with foam roller 3 x 10  Standing retraction BTB- cueing for scapular retraction 3x10 Standing scapula retraction Ts 3 x 10  Seated thoracic ball roll out x12                           PT Education -  09/07/21 0930     Education Details therex form and sequencing    Person(s) Educated Patient    Methods Explanation;Demonstration;Tactile cues    Comprehension Verbalized understanding;Returned demonstration              PT Short Term Goals - 08/10/21 1045       PT SHORT TERM GOAL #1   Title Pt will be independent with HEP in order to improve strength and decrease pain in order to improve pain-free function at home and work.    Baseline 08/10/21 HEP given    Time 4    Period Weeks    Status New               PT Long Term Goals - 08/10/21 1045       PT LONG TERM GOAL #1   Title Patient will demonstrate full bilat shoulder AROM in order to complete self care ADLs    Baseline 08/09/21 R/L flexion 146/143; abd 140/142; ER C6/C2; IR R PSIS/L PSIS    Time 8    Period Weeks    Status New      PT LONG TERM GOAL #2   Title Pt will decrease worst pain as reported on NPRS by at least 3 points in order to demonstrate clinically significant reduction in pain.    Baseline 08/09/21 4/10    Time 8    Period Weeks    Status New      PT LONG TERM GOAL #3   Title Pt will increase periscapular strength of  to at least 4/5 MMT grade in order to demonstrate improvement in strength and function    Baseline 08/09/21 Y lower trap 3+/5 bilat; T scap retractors 4-/5 bilat    Time 8    Period Weeks    Status New      PT LONG TERM GOAL #4   Title Patient will increase FOTO score to 65 to demonstrate predicted increase in functional mobility to complete ADLs    Baseline 08/09/21 42    Time 8     Period Weeks    Status New                   Plan - 09/07/21 0931     Clinical Impression Statement pnt presented with improved AROM in bilateral shoulders. SPT progressed therex to strengthen parascapular muscles and increase bilateral shoulder mobility. Pnt exerienced no increase in pain and required minimal tacile cueing. Pnt demonstrated understanding of SPT direction and maintained good motivation throughout session. PT will progress as able.    Personal Factors and Comorbidities Comorbidity 3+;Past/Current Experience;Time since onset of injury/illness/exacerbation;Fitness    Comorbidities COPD, arthritis, HLD, chronic pain    Examination-Activity Limitations Reach Overhead;Lift;Carry;Hygiene/Grooming;Dressing    Examination-Participation Restrictions Cleaning;Community Activity;Driving;Meal Prep    Stability/Clinical Decision Making Evolving/Moderate complexity    Clinical Decision Making Moderate    Rehab Potential Good    PT Frequency 2x / week    PT Duration 8 weeks    PT Treatment/Interventions Electrical Stimulation;Moist Heat;DME Instruction;Traction;Ultrasound;Cryotherapy;Iontophoresis 4mg /ml Dexamethasone;Therapeutic exercise;Functional mobility training;Therapeutic activities;Neuromuscular re-education;Manual techniques;Patient/family education;Passive range of motion;Dry needling;Splinting;Taping;Joint Manipulations;Spinal Manipulations    PT Next Visit Plan posture education, periscapular strengthening    PT Home Exercise Plan scapular retractions with BTB    Consulted and Agree with Plan of Care Patient             Patient will benefit from skilled therapeutic intervention in order to improve the following deficits and  impairments:  Decreased activity tolerance, Decreased endurance, Decreased strength, Increased fascial restricitons, Impaired UE functional use, Improper body mechanics, Pain, Postural dysfunction, Impaired tone, Impaired flexibility, Decreased  mobility, Decreased safety awareness  Visit Diagnosis: Stiffness of right shoulder, not elsewhere classified     Problem List Patient Active Problem List   Diagnosis Date Noted   Stiffness of left shoulder, not elsewhere classified 08/15/2021   Hx of total hip arthroplasty, left 06/21/2020   COPD with asthma ([place]) 06/18/2020   Hyperglycemia 06/18/2020   Hyperlipidemia 06/18/2020   Urinary incontinence, mixed 06/18/2020   Pulmonary nodules 06/07/2020   Avascular necrosis of femoral head, left ([place]) 04/02/2020   Primary osteoarthritis of left hip 04/02/2020   Numbness 08/19/2019   Ataxia 07/15/2019   Bilateral carpal tunnel syndrome 07/15/2019   Obstructive sleep apnea 06/19/2017   BMI 40.0-44.9, adult ([place]) 04/29/2016   Primary osteoarthritis of left knee 01/19/2016   Rotator cuff tendinitis, left 11/20/2015   Complex tear of lateral meniscus of right knee as current injury 11/20/2015   Primary osteoarthritis of right knee 11/20/2015   Essential hypertension 10/19/2015   Chronic pain of both shoulders 07/20/2015   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   Neuropathy 04/13/2014   Vitamin D deficiency 10/15/2013     Durwin Reges DPT Claiborne Billings O'Daniel, SPT Durwin Reges, PT 09/07/2021, 10:26 AM  [facility] [facility] [address] [name], [address] Phone: [phone]   Fax:  [phone]  Name: Laura Hale MRN: [id number] Date of Birth: 06-02-1946

## 2021-09-10 ENCOUNTER — Ambulatory Visit: Payer: PPO | Admitting: Physical Therapy

## 2021-09-10 ENCOUNTER — Other Ambulatory Visit: Payer: Self-pay

## 2021-09-10 ENCOUNTER — Encounter: Payer: Self-pay | Admitting: Physical Therapy

## 2021-09-10 DIAGNOSIS — M546 Pain in thoracic spine: Secondary | ICD-10-CM

## 2021-09-10 DIAGNOSIS — M25611 Stiffness of right shoulder, not elsewhere classified: Secondary | ICD-10-CM

## 2021-09-10 DIAGNOSIS — M25612 Stiffness of left shoulder, not elsewhere classified: Secondary | ICD-10-CM | POA: Diagnosis not present

## 2021-09-10 DIAGNOSIS — R918 Other nonspecific abnormal finding of lung field: Secondary | ICD-10-CM

## 2021-09-10 NOTE — Therapy (Signed)
[facility] [address], [name], [address] Phone: [phone]   Fax:  [phone]  Physical Therapy Treatment  Patient Details  Name: Laura Hale MRN: [id number] Date of Birth: 12/05/1945 Referring Provider (PT): Caryl Comes [facility]   Encounter Date: 09/10/2021   PT End of Session - 09/10/21 1350     Visit Number 9    Number of Visits 17    Date for PT Re-Evaluation 10/05/21    Authorization - Visit Number 9    Authorization - Number of Visits 10    PT Start Time 6314    PT Stop Time 1430    PT Time Calculation (min) 45 min    Activity Tolerance Patient tolerated treatment well    Behavior During Therapy [facility] for tasks assessed/performed             Past Medical History:  Diagnosis Date   Arthritis    elbow, hands, neck    Asthma    allergy induced asthma-only exacerbated by smells like perfume, smoke etc   Atypical childhood psychosis 2007   myoview -negative for ischemia with preserved LV function   Basal cell carcinoma ~2006   nasal tip   Colon polyps    Complication of anesthesia    WITH ACDF IN JUNE 2016-PT [place] X 10 WEEKS DUE TO INTUBATION   COPD (chronic obstructive pulmonary disease) ([place])    Difficult intubation    with last procedure.  couldn't talk due to tube   Edema    LEGS/FEET   Hypercalcemia 2009   calcium  WNL 10.2 on 04-2015   Hyperlipidemia    Obesity    Urinary incontinence     Past Surgical History:  Procedure Laterality Date   ABDOMINAL HYSTERECTOMY     one ovary remains   ANTERIOR CERVICAL DECOMP/DISCECTOMY FUSION N/A 01/31/2015   Procedure: ANTERIOR CERVICAL DECOMPRESSION/DISCECTOMY FUSION CERVICAL FIVE SIX;  Surgeon: Karie Chimera, MD;  Location: [facility];  Service: Neurosurgery;  Laterality: N/A;   BACK SURGERY     C 6-C7 neck    CATARACT EXTRACTION W/PHACO Left 02/06/2017   Procedure: CATARACT EXTRACTION PHACO AND INTRAOCULAR LENS  PLACEMENT (IOC);  Surgeon: Eulogio Bear, MD;  Location: [facility];  Service: Ophthalmology;  Laterality: Left;  Lot# [id number] H [place]: 00:37.9 AP%: 7.9 CDE: 2.97   CATARACT EXTRACTION W/PHACO Right 03/06/2017   Procedure: CATARACT EXTRACTION PHACO AND INTRAOCULAR LENS PLACEMENT (IOC);  Surgeon: Eulogio Bear, MD;  Location: [facility];  Service: Ophthalmology;  Laterality: Right;  Lot #[id number] H [place]: 00.22.0 AP%: 7.5 CDE: 1.64    COLONOSCOPY     COLONOSCOPY WITH PROPOFOL N/A 09/23/2017   Procedure: COLONOSCOPY WITH PROPOFOL;  Surgeon: Toledo, Benay Pike, MD;  Location: [facility];  Service: Gastroenterology;  Laterality: N/A;   ELBOW SURGERY Left 04/2003   x2  & for repair & then remove all hardware, due to injury from a fall   EYE SURGERY Bilateral    cataracts   FRACTURE SURGERY Right    shoulder and arm   FRACTURE SURGERY Left 2003   elbow   HAND SURGERY Right 2006   injury- then eventually lost remainder of thumb   KNEE ARTHROSCOPY WITH LATERAL MENISECTOMY Left 01/23/2016   Procedure: KNEE ARTHROSCOPY WITH LATERAL MENISECTOMY;  Surgeon: Corky Mull, MD;  Location: [facility];  Service: Orthopedics;  Laterality: Left;   ORIF HUMERUS FRACTURE Right 08/31/2015   Procedure: OPEN REDUCTION  INTERNAL FIXATION (ORIF) PROXIMAL HUMERUS FRACTURE w/ #2 fberwire;  Surgeon: Corky Mull, MD;  Location: [facility];  Service: Orthopedics;  Laterality: Right;   THUMB AMPUTATION  2005   partial   TOTAL HIP ARTHROPLASTY Left 06/21/2020   Procedure: TOTAL HIP ARTHROPLASTY;  Surgeon: Dereck Leep, MD;  Location: [facility];  Service: Orthopedics;  Laterality: Left;    There were no vitals filed for this visit.   Subjective Assessment - 09/10/21 1347     Subjective pnt reports right shoulder kept her awake at night this weekend, but there is no pain today    Pertinent History Pt is a 76 year old female presenting with chronic bilat shoulder pain. Reports she was in an accident in 2003 where she  broke her L elbow, and subsequently had frozen shoulder, and reports she has had off and on pain since then with most recent episode of increased pain a couple months ago. Reports she had a fall 5 years ago and broke her R arm in 3 places and has had off and on pain since then in this shoulder, with most recent episode 3 months ago. Reports increased pain with insidious onset, just that she has not been moving as much. Points to pain at upper arm, inferior to GHJ that is anterior/lateral  of both Ue's, reports they feel the same but her pain can come on at different times in each arm. . Reports this pain feels like a muscle ache, and is 2/10 currently; worst pain 4/10, best 0/10. Reports pain is exacerbated by lifting/carrying, reaching behind her and overhead. Pt is R handed, retired, lives at home with her spouse in a one story home with 1 step to enter, is completing all ADLs ind with pain with bathing and dressing. She is ind with driving and community activity, previously enjoyed walking, but has difficulty doing this following hip replacement last year. Has had no falls in the past 6 months. Pt denies N/V, B&B changes, unexplained weight fluctuation, saddle paresthesia, fever, night sweats, or unrelenting night pain at this time.    Limitations Writing;Lifting;House hold activities    How long can you sit comfortably? unlimited    How long can you stand comfortably? unilimited    How long can you walk comfortably? unlimited by shoulders; due to hip replacement and fear of falling only 5 mins    Diagnostic tests none to date    Patient Stated Goals decrease shoulder pain, and increase mobility in bilat shoulders    Currently in Pain? No/denies    Pain Score 0-No pain    Pain Location Arm    Pain Orientation Right;Upper    Pain Descriptors / Indicators Aching    Pain Type Chronic pain    Pain Onset More than a month ago    Pain Frequency Intermittent                      Ther-Ex Nustep seat 8 UE 9 L4 with cuing for scapular retraction/protraction with LE assist only for 5 min Wall angels 3x 12- good carryover from last session  Wall Slides with foam roller 3 x 10  Standing low rows BTB- cueing for scapular retraction 3x10 Seated thoracic ball roll out x12                   PT Education - 09/10/21 1350     Education Details therex form    Person(s) Educated Patient  Methods Explanation;Demonstration;Tactile cues    Comprehension Verbalized understanding;Returned demonstration              PT Short Term Goals - 08/10/21 1045       PT SHORT TERM GOAL #1   Title Pt will be independent with HEP in order to improve strength and decrease pain in order to improve pain-free function at home and work.    Baseline 08/10/21 HEP given    Time 4    Period Weeks    Status New               PT Long Term Goals - 08/10/21 1045       PT LONG TERM GOAL #1   Title Patient will demonstrate full bilat shoulder AROM in order to complete self care ADLs    Baseline 08/09/21 R/L flexion 146/143; abd 140/142; ER C6/C2; IR R PSIS/L PSIS    Time 8    Period Weeks    Status New      PT LONG TERM GOAL #2   Title Pt will decrease worst pain as reported on NPRS by at least 3 points in order to demonstrate clinically significant reduction in pain.    Baseline 08/09/21 4/10    Time 8    Period Weeks    Status New      PT LONG TERM GOAL #3   Title Pt will increase periscapular strength of  to at least 4/5 MMT grade in order to demonstrate improvement in strength and function    Baseline 08/09/21 Y lower trap 3+/5 bilat; T scap retractors 4-/5 bilat    Time 8    Period Weeks    Status New      PT LONG TERM GOAL #4   Title Patient will increase FOTO score to 65 to demonstrate predicted increase in functional mobility to complete ADLs    Baseline 08/09/21 42    Time 8    Period Weeks    Status New                    Plan - 09/10/21 1520     Clinical Impression Statement pnt presented with imporved AROM in bilateral shoulders and a decrease in right shoulder pain. SPT progressed therex to include endurance training for parascapular muscles and increase shoulder mobility. Pnt experienced no increase in pain and needed min verbal cueing to maintain form. pnt demonstrated understanding of therex cueing and maintaied good motivation throughout session. PT will proress as able    Personal Factors and Comorbidities Comorbidity 3+;Past/Current Experience;Time since onset of injury/illness/exacerbation;Fitness    Comorbidities COPD, arthritis, HLD, chronic pain    Examination-Activity Limitations Reach Overhead;Lift;Carry;Hygiene/Grooming;Dressing    Examination-Participation Restrictions Cleaning;Community Activity;Driving;Meal Prep    Stability/Clinical Decision Making Evolving/Moderate complexity    Clinical Decision Making Moderate    Rehab Potential Good    PT Frequency 2x / week    PT Duration 8 weeks    PT Treatment/Interventions Electrical Stimulation;Moist Heat;DME Instruction;Traction;Ultrasound;Cryotherapy;Iontophoresis 4mg /ml Dexamethasone;Therapeutic exercise;Functional mobility training;Therapeutic activities;Neuromuscular re-education;Manual techniques;Patient/family education;Passive range of motion;Dry needling;Splinting;Taping;Joint Manipulations;Spinal Manipulations    PT Next Visit Plan posture education    PT Home Exercise Plan scapular retractions with BTB    Consulted and Agree with Plan of Care Patient             Patient will benefit from skilled therapeutic intervention in order to improve the following deficits and impairments:  Decreased activity tolerance, Decreased endurance, Decreased strength, Increased fascial  restricitons, Impaired UE functional use, Improper body mechanics, Pain, Postural dysfunction, Impaired tone, Impaired flexibility, Decreased mobility,  Decreased safety awareness  Visit Diagnosis: Stiffness of right shoulder, not elsewhere classified  Stiffness of left shoulder, not elsewhere classified  Pain in thoracic spine  Pulmonary nodules     Problem List Patient Active Problem List   Diagnosis Date Noted   Stiffness of left shoulder, not elsewhere classified 08/15/2021   Hx of total hip arthroplasty, left 06/21/2020   COPD with asthma ([place]) 06/18/2020   Hyperglycemia 06/18/2020   Hyperlipidemia 06/18/2020   Urinary incontinence, mixed 06/18/2020   Pulmonary nodules 06/07/2020   Avascular necrosis of femoral head, left ([place]) 04/02/2020   Primary osteoarthritis of left hip 04/02/2020   Numbness 08/19/2019   Ataxia 07/15/2019   Bilateral carpal tunnel syndrome 07/15/2019   Obstructive sleep apnea 06/19/2017   BMI 40.0-44.9, adult ([place]) 04/29/2016   Primary osteoarthritis of left knee 01/19/2016   Rotator cuff tendinitis, left 11/20/2015   Complex tear of lateral meniscus of right knee as current injury 11/20/2015   Primary osteoarthritis of right knee 11/20/2015   Essential hypertension 10/19/2015   Chronic pain of both shoulders 07/20/2015   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   Neuropathy 04/13/2014   Vitamin D deficiency 10/15/2013    Durwin Reges DPT Claiborne Billings O'Daniel, SPT Durwin Reges, PT 09/11/2021, 2:02 PM  [facility] [address], [name], [address] Phone: [phone]   Fax:  [phone]  Name: Laura Hale MRN: [id number] Date of Birth: [birth year]

## 2021-09-14 ENCOUNTER — Ambulatory Visit: Payer: PPO | Attending: Internal Medicine | Admitting: Physical Therapy

## 2021-09-14 ENCOUNTER — Encounter: Payer: Self-pay | Admitting: Physical Therapy

## 2021-09-14 ENCOUNTER — Other Ambulatory Visit: Payer: Self-pay

## 2021-09-14 DIAGNOSIS — M25612 Stiffness of left shoulder, not elsewhere classified: Secondary | ICD-10-CM | POA: Insufficient documentation

## 2021-09-14 DIAGNOSIS — M25611 Stiffness of right shoulder, not elsewhere classified: Secondary | ICD-10-CM | POA: Insufficient documentation

## 2021-09-14 DIAGNOSIS — M546 Pain in thoracic spine: Secondary | ICD-10-CM | POA: Insufficient documentation

## 2021-09-14 DIAGNOSIS — R918 Other nonspecific abnormal finding of lung field: Secondary | ICD-10-CM | POA: Diagnosis not present

## 2021-09-14 NOTE — Therapy (Signed)
[facility] [address], [name], [address] Phone: [phone]   Fax:  [phone]  Physical Therapy Treatment/Progress Note Reporting Period 08/09/21 - 09/14/21  Patient Details  Name: Laura Hale MRN: [id number] Date of Birth: 12/29/1945 Referring Provider (PT): Caryl Comes [facility]   Encounter Date: 09/14/2021   PT End of Session - 09/14/21 1149     Visit Number 10    Number of Visits 17    Date for PT Re-Evaluation 10/05/21    Authorization - Visit Number 10    Authorization - Number of Visits 10    PT Start Time 1100    PT Stop Time 3614    PT Time Calculation (min) 38 min    Activity Tolerance Patient tolerated treatment well    Behavior During Therapy [facility] for tasks assessed/performed             Past Medical History:  Diagnosis Date   Arthritis    elbow, hands, neck    Asthma    allergy induced asthma-only exacerbated by smells like perfume, smoke etc   Atypical childhood psychosis 2007   myoview -negative for ischemia with preserved LV function   Basal cell carcinoma ~2006   nasal tip   Colon polyps    Complication of anesthesia    WITH ACDF IN JUNE 2016-PT [place] X 10 WEEKS DUE TO INTUBATION   COPD (chronic obstructive pulmonary disease) ([place])    Difficult intubation    with last procedure.  couldn't talk due to tube   Edema    LEGS/FEET   Hypercalcemia 2009   calcium  WNL 10.2 on 04-2015   Hyperlipidemia    Obesity    Urinary incontinence     Past Surgical History:  Procedure Laterality Date   ABDOMINAL HYSTERECTOMY     one ovary remains   ANTERIOR CERVICAL DECOMP/DISCECTOMY FUSION N/A 01/31/2015   Procedure: ANTERIOR CERVICAL DECOMPRESSION/DISCECTOMY FUSION CERVICAL FIVE SIX;  Surgeon: Karie Chimera, MD;  Location: [facility];  Service: Neurosurgery;  Laterality: N/A;   BACK SURGERY     C 6-C7 neck    CATARACT EXTRACTION W/PHACO Left 02/06/2017    Procedure: CATARACT EXTRACTION PHACO AND INTRAOCULAR LENS PLACEMENT (IOC);  Surgeon: Eulogio Bear, MD;  Location: [facility];  Service: Ophthalmology;  Laterality: Left;  Lot# [id number] H [place]: 00:37.9 AP%: 7.9 CDE: 2.97   CATARACT EXTRACTION W/PHACO Right 03/06/2017   Procedure: CATARACT EXTRACTION PHACO AND INTRAOCULAR LENS PLACEMENT (IOC);  Surgeon: Eulogio Bear, MD;  Location: [facility];  Service: Ophthalmology;  Laterality: Right;  Lot #[id number] H [place]: 00.22.0 AP%: 7.5 CDE: 1.64    COLONOSCOPY     COLONOSCOPY WITH PROPOFOL N/A 09/23/2017   Procedure: COLONOSCOPY WITH PROPOFOL;  Surgeon: Toledo, Benay Pike, MD;  Location: [facility];  Service: Gastroenterology;  Laterality: N/A;   ELBOW SURGERY Left 04/2003   x2  & for repair & then remove all hardware, due to injury from a fall   EYE SURGERY Bilateral    cataracts   FRACTURE SURGERY Right    shoulder and arm   FRACTURE SURGERY Left 2003   elbow   HAND SURGERY Right 2006   injury- then eventually lost remainder of thumb   KNEE ARTHROSCOPY WITH LATERAL MENISECTOMY Left 01/23/2016   Procedure: KNEE ARTHROSCOPY WITH LATERAL MENISECTOMY;  Surgeon: Corky Mull, MD;  Location: [facility];  Service: Orthopedics;  Laterality: Left;   ORIF HUMERUS FRACTURE Right  08/31/2015   Procedure: OPEN REDUCTION INTERNAL FIXATION (ORIF) PROXIMAL HUMERUS FRACTURE w/ #2 fberwire;  Surgeon: Corky Mull, MD;  Location: [facility];  Service: Orthopedics;  Laterality: Right;   THUMB AMPUTATION  2005   partial   TOTAL HIP ARTHROPLASTY Left 06/21/2020   Procedure: TOTAL HIP ARTHROPLASTY;  Surgeon: Dereck Leep, MD;  Location: [facility];  Service: Orthopedics;  Laterality: Left;    There were no vitals filed for this visit.   Subjective Assessment - 09/14/21 1148     Subjective pnt reports no pain today and being compliant with HEP    Pertinent History Pt is a 76 year old female presenting with chronic bilat shoulder pain. Reports she was in an accident  in 2003 where she broke her L elbow, and subsequently had frozen shoulder, and reports she has had off and on pain since then with most recent episode of increased pain a couple months ago. Reports she had a fall 5 years ago and broke her R arm in 3 places and has had off and on pain since then in this shoulder, with most recent episode 3 months ago. Reports increased pain with insidious onset, just that she has not been moving as much. Points to pain at upper arm, inferior to GHJ that is anterior/lateral  of both Ue's, reports they feel the same but her pain can come on at different times in each arm. . Reports this pain feels like a muscle ache, and is 2/10 currently; worst pain 4/10, best 0/10. Reports pain is exacerbated by lifting/carrying, reaching behind her and overhead. Pt is R handed, retired, lives at home with her spouse in a one story home with 1 step to enter, is completing all ADLs ind with pain with bathing and dressing. She is ind with driving and community activity, previously enjoyed walking, but has difficulty doing this following hip replacement last year. Has had no falls in the past 6 months. Pt denies N/V, B&B changes, unexplained weight fluctuation, saddle paresthesia, fever, night sweats, or unrelenting night pain at this time.    Limitations Writing;Lifting;House hold activities    How long can you sit comfortably? unlimited    How long can you stand comfortably? unilimited    How long can you walk comfortably? unlimited by shoulders; due to hip replacement and fear of falling only 5 mins    Diagnostic tests none to date    Patient Stated Goals decrease shoulder pain, and increase mobility in bilat shoulders    Currently in Pain? No/denies                      Therex  Nu-Step LE 10, UE 7- for increased lumbosacral mobility  Wall Angels x12 - good carry over from last session  ER with shoulder flexion with RTB 3 x 8- mod cueing to relax shoulders Standing  isometric ER against wall with ball 3 x 8  Standing bent over rows #5 weights - max tactile cueing to contract LT Seated thoracic ball roll out x12                 PT Education - 09/14/21 1149     Education Details therex form    Person(s) Educated Patient    Methods Explanation;Demonstration;Tactile cues;Verbal cues    Comprehension Verbalized understanding;Returned demonstration              PT Short Term Goals - 09/15/21 1145  PT SHORT TERM GOAL #1   Title Pt will be independent with HEP in order to improve strength and decrease pain in order to improve pain-free function at home and work.    Baseline 08/10/21 HEP given; 09/14/21 Completing HEP regularly    Time 4    Period Weeks    Status Achieved               PT Long Term Goals - 09/15/21 1146       PT LONG TERM GOAL #1   Title Patient will demonstrate full bilat shoulder AROM in order to complete self care ADLs    Baseline 08/09/21 R/L flexion 146/143; abd 140/142; ER C6/C2; IR R PSIS/L PSIS    Time 8    Period Weeks    Status Deferred      PT LONG TERM GOAL #2   Title Pt will decrease worst pain as reported on NPRS by at least 3 points in order to demonstrate clinically significant reduction in pain.    Baseline 08/09/21 4/10    Time 8    Period Weeks    Status Deferred      PT LONG TERM GOAL #3   Title Pt will increase periscapular strength of  to at least 4/5 MMT grade in order to demonstrate improvement in strength and function    Baseline 08/09/21 Y lower trap 3+/5 bilat; T scap retractors 4-/5 bilat    Time 8    Period Weeks    Status Deferred      PT LONG TERM GOAL #4   Title Patient will increase FOTO score to 65 to demonstrate predicted increase in functional mobility to complete ADLs    Baseline 08/09/21 42    Time 8    Period Weeks    Status Deferred                   Plan - 09/14/21 1150     Clinical Impression Statement pnt presented with no pain today  which allowed spt to progress therex to strengthen external shoulder rotators in functional planes of movment. pnt experienced no increase in pain and able to comply with spt therex cueing. pnt demonstrated understaing of therex cueing and maintained good motivation throughout session. PT will progress as able. Reassessment deferred to next visit    Personal Factors and Comorbidities Comorbidity 3+;Past/Current Experience;Time since onset of injury/illness/exacerbation;Fitness    Comorbidities COPD, arthritis, HLD, chronic pain    Examination-Activity Limitations Reach Overhead;Lift;Carry;Hygiene/Grooming;Dressing    Examination-Participation Restrictions Cleaning;Community Activity;Driving;Meal Prep    Stability/Clinical Decision Making Evolving/Moderate complexity    Clinical Decision Making Moderate    Rehab Potential Good    PT Frequency 2x / week    PT Duration 8 weeks    PT Treatment/Interventions Electrical Stimulation;Moist Heat;DME Instruction;Traction;Ultrasound;Cryotherapy;Iontophoresis 4mg /ml Dexamethasone;Therapeutic exercise;Functional mobility training;Therapeutic activities;Neuromuscular re-education;Manual techniques;Patient/family education;Passive range of motion;Dry needling;Splinting;Taping;Joint Manipulations;Spinal Manipulations    PT Next Visit Plan posture education    PT Home Exercise Plan scapular retractions with BTB    Consulted and Agree with Plan of Care Patient             Patient will benefit from skilled therapeutic intervention in order to improve the following deficits and impairments:  Decreased activity tolerance, Decreased endurance, Decreased strength, Increased fascial restricitons, Impaired UE functional use, Improper body mechanics, Pain, Postural dysfunction, Impaired tone, Impaired flexibility, Decreased mobility, Decreased safety awareness  Visit Diagnosis: Stiffness of right shoulder, not elsewhere classified  Stiffness of left  shoulder, not  elsewhere classified     Problem List Patient Active Problem List   Diagnosis Date Noted   Stiffness of left shoulder, not elsewhere classified 08/15/2021   Hx of total hip arthroplasty, left 06/21/2020   COPD with asthma ([place]) 06/18/2020   Hyperglycemia 06/18/2020   Hyperlipidemia 06/18/2020   Urinary incontinence, mixed 06/18/2020   Pulmonary nodules 06/07/2020   Avascular necrosis of femoral head, left ([place]) 04/02/2020   Primary osteoarthritis of left hip 04/02/2020   Numbness 08/19/2019   Ataxia 07/15/2019   Bilateral carpal tunnel syndrome 07/15/2019   Obstructive sleep apnea 06/19/2017   BMI 40.0-44.9, adult ([place]) 04/29/2016   Primary osteoarthritis of left knee 01/19/2016   Rotator cuff tendinitis, left 11/20/2015   Complex tear of lateral meniscus of right knee as current injury 11/20/2015   Primary osteoarthritis of right knee 11/20/2015   Essential hypertension 10/19/2015   Chronic pain of both shoulders 07/20/2015   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   Neuropathy 04/13/2014   Vitamin D deficiency 10/15/2013    Durwin Reges DPT Claiborne Billings O'Daniel, SPT Durwin Reges, PT 09/15/2021, 11:52 AM  [facility] [address] [name], [address] Phone: [phone]   Fax:  [phone]  Name: Laura Hale MRN: [id number] Date of Birth: 07/03/1946

## 2021-09-17 ENCOUNTER — Ambulatory Visit: Payer: PPO | Admitting: Physical Therapy

## 2021-09-17 ENCOUNTER — Other Ambulatory Visit: Payer: Self-pay

## 2021-09-17 ENCOUNTER — Encounter: Payer: Self-pay | Admitting: Physical Therapy

## 2021-09-17 DIAGNOSIS — M25611 Stiffness of right shoulder, not elsewhere classified: Secondary | ICD-10-CM

## 2021-09-17 DIAGNOSIS — M25612 Stiffness of left shoulder, not elsewhere classified: Secondary | ICD-10-CM

## 2021-09-17 DIAGNOSIS — M546 Pain in thoracic spine: Secondary | ICD-10-CM

## 2021-09-17 DIAGNOSIS — R918 Other nonspecific abnormal finding of lung field: Secondary | ICD-10-CM

## 2021-09-17 NOTE — Therapy (Signed)
[facility] [address], [name], [address] Phone: [phone]   Fax:  [phone]  Physical Therapy Treatment/Progress Note  Patient Details  Name: Laura Hale MRN: [id number] Date of Birth: [birth year] Referring Provider (PT): Caryl Comes [facility]   Encounter Date: 09/17/2021   PT End of Session - 09/17/21 1116     Visit Number 11    Number of Visits 17    Date for PT Re-Evaluation 10/05/21    Authorization - Visit Number 11    Authorization - Number of Visits 10    PT Start Time 4481    PT Stop Time 1115    PT Time Calculation (min) 45 min    Activity Tolerance Patient tolerated treatment well    Behavior During Therapy [facility] for tasks assessed/performed             Past Medical History:  Diagnosis Date   Arthritis    elbow, hands, neck    Asthma    allergy induced asthma-only exacerbated by smells like perfume, smoke etc   Atypical childhood psychosis 2007   myoview -negative for ischemia with preserved LV function   Basal cell carcinoma ~2006   nasal tip   Colon polyps    Complication of anesthesia    WITH ACDF IN JUNE 2016-PT [place] X 10 WEEKS DUE TO INTUBATION   COPD (chronic obstructive pulmonary disease) ([place])    Difficult intubation    with last procedure.  couldn't talk due to tube   Edema    LEGS/FEET   Hypercalcemia 2009   calcium  WNL 10.2 on 04-2015   Hyperlipidemia    Obesity    Urinary incontinence     Past Surgical History:  Procedure Laterality Date   ABDOMINAL HYSTERECTOMY     one ovary remains   ANTERIOR CERVICAL DECOMP/DISCECTOMY FUSION N/A 01/31/2015   Procedure: ANTERIOR CERVICAL DECOMPRESSION/DISCECTOMY FUSION CERVICAL FIVE SIX;  Surgeon: Karie Chimera, MD;  Location: [place] [facility];  Service: Neurosurgery;  Laterality: N/A;   BACK SURGERY     C 6-C7 neck    CATARACT EXTRACTION W/PHACO Left 02/06/2017   Procedure: CATARACT EXTRACTION PHACO AND  INTRAOCULAR LENS PLACEMENT (IOC);  Surgeon: Eulogio Bear, MD;  Location: [facility] ORS;  Service: Ophthalmology;  Laterality: Left;  Lot# [id number] H [place]: 00:37.9 AP%: 7.9 CDE: 2.97   CATARACT EXTRACTION W/PHACO Right 03/06/2017   Procedure: CATARACT EXTRACTION PHACO AND INTRAOCULAR LENS PLACEMENT (IOC);  Surgeon: Eulogio Bear, MD;  Location: [facility] ORS;  Service: Ophthalmology;  Laterality: Right;  Lot #[id number] H [place]: 00.22.0 AP%: 7.5 CDE: 1.64    COLONOSCOPY     COLONOSCOPY WITH PROPOFOL N/A 09/23/2017   Procedure: COLONOSCOPY WITH PROPOFOL;  Surgeon: Toledo, Benay Pike, MD;  Location: [facility];  Service: Gastroenterology;  Laterality: N/A;   ELBOW SURGERY Left 04/2003   x2  & for repair & then remove all hardware, due to injury from a fall   EYE SURGERY Bilateral    cataracts   FRACTURE SURGERY Right    shoulder and arm   FRACTURE SURGERY Left 2003   elbow   HAND SURGERY Right 2006   injury- then eventually lost remainder of thumb   KNEE ARTHROSCOPY WITH LATERAL MENISECTOMY Left 01/23/2016   Procedure: KNEE ARTHROSCOPY WITH LATERAL MENISECTOMY;  Surgeon: Corky Mull, MD;  Location: [facility] ORS;  Service: Orthopedics;  Laterality: Left;   ORIF HUMERUS FRACTURE Right 08/31/2015   Procedure: OPEN  REDUCTION INTERNAL FIXATION (ORIF) PROXIMAL HUMERUS FRACTURE w/ #2 fberwire;  Surgeon: Corky Mull, MD;  Location: [facility] ORS;  Service: Orthopedics;  Laterality: Right;   THUMB AMPUTATION  2005   partial   TOTAL HIP ARTHROPLASTY Left 06/21/2020   Procedure: TOTAL HIP ARTHROPLASTY;  Surgeon: Dereck Leep, MD;  Location: [facility] ORS;  Service: Orthopedics;  Laterality: Left;    There were no vitals filed for this visit.   Subjective Assessment - 09/17/21 1033     Subjective pt reports no pain right now, but pain kept her up all last night. pnt reports being compliant with HEP    Pertinent History Pt is a 76 year old female presenting with chronic bilat shoulder pain. Reports she was in an  accident in 2003 where she broke her L elbow, and subsequently had frozen shoulder, and reports she has had off and on pain since then with most recent episode of increased pain a couple months ago. Reports she had a fall 5 years ago and broke her R arm in 3 places and has had off and on pain since then in this shoulder, with most recent episode 3 months ago. Reports increased pain with insidious onset, just that she has not been moving as much. Points to pain at upper arm, inferior to GHJ that is anterior/lateral  of both Ue's, reports they feel the same but her pain can come on at different times in each arm. . Reports this pain feels like a muscle ache, and is 2/10 currently; worst pain 4/10, best 0/10. Reports pain is exacerbated by lifting/carrying, reaching behind her and overhead. Pt is R handed, retired, lives at home with her spouse in a one story home with 1 step to enter, is completing all ADLs ind with pain with bathing and dressing. She is ind with driving and community activity, previously enjoyed walking, but has difficulty doing this following hip replacement last year. Has had no falls in the past 6 months. Pt denies N/V, B&B changes, unexplained weight fluctuation, saddle paresthesia, fever, night sweats, or unrelenting night pain at this time.    Limitations Writing;Lifting;House hold activities    How long can you sit comfortably? unlimited    How long can you stand comfortably? unilimited    How long can you walk comfortably? unlimited by shoulders; due to hip replacement and fear of falling only 5 mins    Diagnostic tests none to date    Patient Stated Goals decrease shoulder pain, and increase mobility in bilat shoulders    Pain Onset More than a month ago                  Therex  Nu-Step LE 10, UE 7- for increased lumbosacral mobility  ER with shoulder flexion with RTB 3 x 8- mod cueing to relax shoulders Standing bent over rows #5 weights 3x 6-min tactile cueing to  retract scapulas  Ys facing the wall 3 x 8   SPT reviewed long term goals- pnt still has deficits in bilateral shoulder ER and IR.   SPT updated HEP to include strengthening for scapular retractors, shoulder external rotators, and LT PT reviewed the following HEP with patient with patient able to demonstrate a set of the following with min cuing for correction needed. PT educated patient on parameters of therex (how/when to inc/decrease intensity, frequency, rep/set range, stretch hold time, and purpose of therex) with verbalized understanding.   Standing Shoulder Horizontal Abduction with Resistance - 1 x  daily - 2 x weekly - 3 sets - 6-12 reps Standing Bent Over Shoulder Row - 1 x daily - 2 x weekly - 3 sets - 6-12 reps Shoulder External Rotation and Scapular Retraction with Resistance - 1 x daily - 2 x weekly - 3 sets - 6-12 reps Low Trap Setting at IXL - 1 x daily - 2 x weekly - 3 sets - 6-12 reps                       PT Education - 09/17/21 1116     Education Details HEP    Person(s) Educated Patient    Methods Explanation;Demonstration;Tactile cues;Verbal cues    Comprehension Verbalized understanding;Returned demonstration              PT Short Term Goals - 09/15/21 1145       PT SHORT TERM GOAL #1   Title Pt will be independent with HEP in order to improve strength and decrease pain in order to improve pain-free function at home and work.    Baseline 08/10/21 HEP given; 09/14/21 Completing HEP regularly    Time 4    Period Weeks    Status Achieved               PT Long Term Goals - 09/17/21 1035       PT LONG TERM GOAL #1   Title Patient will demonstrate full bilat shoulder AROM in order to complete self care ADLs    Baseline 09/17/21 IR R: C4 L: C2,bilateral ER PSIS. abduction bilaterally 170 and flexion WNL; 08/09/21 R/L flexion 146/143; abd 140/142; ER C6/C2; IR R PSIS/L PSIS    Status Partially Met      PT LONG TERM GOAL #2   Title Pt  will decrease worst pain as reported on NPRS by at least 3 points in order to demonstrate clinically significant reduction in pain.    Baseline 08/09/21 4/10. 09/17/21 0/10    Time 8    Period Weeks    Status Achieved      PT LONG TERM GOAL #3   Title Pt will increase periscapular strength of  to at least 4/5 MMT grade in order to demonstrate improvement in strength and function    Baseline 08/09/21 Y lower trap 3+/5 bilat; T scap retractors 4-/5 bilat. Y lower trap 4/5 bilaterally; scap retractors 4/5 bilaterally    Time 8    Period Weeks    Status Achieved      PT LONG TERM GOAL #4   Title Patient will increase FOTO score to 65 to demonstrate predicted increase in functional mobility to complete ADLs    Baseline 08/09/21 42; 09/17/21 57    Time 8    Period Weeks    Status On-going                   Plan - 09/17/21 1117     Clinical Impression Statement pnt presented with no pain today. SPT reviewed goals and pnt still presents deficits in bilateral shoulder ER and IR. therex was progressed to include strengthening for LT, ER, and scapular retractors. pnt maintained good motivation and demonstrated understanding of therex form and updated HEP. pnt expressd consent to doing PT once a week with robust HEP. PT will progress as able    Personal Factors and Comorbidities Comorbidity 3+;Past/Current Experience;Time since onset of injury/illness/exacerbation;Fitness    Comorbidities COPD, arthritis, HLD, chronic pain    Examination-Activity Limitations  Reach Overhead;Lift;Carry;Hygiene/Grooming;Dressing    Examination-Participation Restrictions Cleaning;Community Activity;Driving;Meal Prep    Stability/Clinical Decision Making Evolving/Moderate complexity    Clinical Decision Making Moderate    Rehab Potential Good    PT Frequency 1x / week    PT Duration 8 weeks    PT Treatment/Interventions Electrical Stimulation;Moist Heat;DME  Instruction;Traction;Ultrasound;Cryotherapy;Iontophoresis 54m/ml Dexamethasone;Therapeutic exercise;Functional mobility training;Therapeutic activities;Neuromuscular re-education;Manual techniques;Patient/family education;Passive range of motion;Dry needling;Splinting;Taping;Joint Manipulations;Spinal Manipulations    PT Next Visit Plan posture education    PT Home Exercise Plan scapular retractions with BTB    Consulted and Agree with Plan of Care Patient             Patient will benefit from skilled therapeutic intervention in order to improve the following deficits and impairments:  Decreased activity tolerance, Decreased endurance, Decreased strength, Increased fascial restricitons, Impaired UE functional use, Improper body mechanics, Pain, Postural dysfunction, Impaired tone, Impaired flexibility, Decreased mobility, Decreased safety awareness  Visit Diagnosis: Stiffness of right shoulder, not elsewhere classified  Stiffness of left shoulder, not elsewhere classified  Pain in thoracic spine  Pulmonary nodules     Problem List Patient Active Problem List   Diagnosis Date Noted   Stiffness of left shoulder, not elsewhere classified 08/15/2021   Hx of total hip arthroplasty, left 06/21/2020   [place]D with [place] 06/18/2020   Hyperglycemia 06/18/2020   Hyperlipidemia 06/18/2020   Urinary incontinence, mixed 06/18/2020   Pulmonary nodules 06/07/2020   Avascular necrosis of femoral head, left ([place] 04/02/2020   Primary osteoarthritis of left hip 04/02/2020   Numbness 08/19/2019   Ataxia 07/15/2019   Bilateral carpal tunnel syndrome 07/15/2019   Obstructive sleep apnea 06/19/2017   BMI 40.0-44.9, adult ([place] 04/29/2016   Primary osteoarthritis of left knee 01/19/2016   Rotator cuff tendinitis, left 11/20/2015   Complex tear of lateral meniscus of right knee as current injury 11/20/2015   Primary osteoarthritis of right knee 11/20/2015   Essential hypertension 10/19/2015    Chronic pain of both shoulders 07/20/2015   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   Neuropathy 04/13/2014   Vitamin D deficiency 10/15/2013     CDurwin RegesDPT KClaiborne BillingsO'Daniel, SPT CDurwin Reges PT 09/17/2021, 1:17 PM  [facility] [address] [place] [address]Phone: [phone]  Fax:  [phone] Name: Laura ALVARENGAMRN: [id number] of Birth: 407/12/1945

## 2021-09-19 ENCOUNTER — Other Ambulatory Visit: Payer: Self-pay

## 2021-09-19 ENCOUNTER — Encounter: Payer: Self-pay | Admitting: Dermatology

## 2021-09-19 ENCOUNTER — Ambulatory Visit: Payer: PPO | Admitting: Dermatology

## 2021-09-19 DIAGNOSIS — L821 Other seborrheic keratosis: Secondary | ICD-10-CM | POA: Diagnosis not present

## 2021-09-19 DIAGNOSIS — L719 Rosacea, unspecified: Secondary | ICD-10-CM

## 2021-09-19 DIAGNOSIS — L219 Seborrheic dermatitis, unspecified: Secondary | ICD-10-CM

## 2021-09-19 MED ORDER — IVERMECTIN 1 % EX CREA: TOPICAL_CREAM | CUTANEOUS | 3 refills | Status: DC

## 2021-09-19 MED ORDER — DOXYCYCLINE 40 MG PO CPDR: 40.0000 mg | DELAYED_RELEASE_CAPSULE | Freq: Every day | ORAL | 3 refills | Status: DC

## 2021-09-19 MED ORDER — PIMECROLIMUS 1 % EX CREA: TOPICAL_CREAM | CUTANEOUS | 3 refills | Status: DC

## 2021-09-19 NOTE — Patient Instructions (Addendum)
Start Oracea 40mg  once daily with food.  Start Soolantra cream at bedtime. Call if not covered, will send to [facility] for rosacea cream.   Can continue Metronidazole 0.75% cream to face in morning.  Start Elidel (pimecrolimus) cream twice daily to itchy/scaly areas on face.   Doxycycline should be taken with food to prevent nausea. Do not lay down for 30 minutes after taking. Be cautious with sun exposure and use good sun protection while on this medication. Pregnant women should not take this medication.    Rosacea  What is rosacea? Rosacea (say: ro-zay-sha) is a common skin disease that usually begins as a trend of flushing or blushing easily.  As rosacea progresses, a persistent redness in the center of the face will develop and may gradually spread beyond the nose and cheeks to the forehead and chin.  In some cases, the ears, chest, and back could be affected.  Rosacea may appear as tiny blood vessels or small red bumps that occur in crops.  Frequently they can contain pus, and are called pustules.  If the bumps do not contain pus, they are referred to as papules.  Rarely, in prolonged, untreated cases of rosacea, the oil glands of the nose and cheeks may become permanently enlarged.  This is called rhinophyma, and is seen more frequently in men.  Signs and Risks In its beginning stages, rosacea tends to come and go, which makes it difficult to recognize.  It can start as intermittent flushing of the face.  Eventually, blood vessels may become permanently visible.  Pustules and papules can appear, but can be mistaken for adult acne.  People of all races, ages, genders and ethnic groups are at risk of developing rosacea.  However, it is more common in women (especially around menopause) and adults with fair skin between the ages of 84 and 97.  Treatment Dermatologists typically recommend a combination of treatments to effectively manage rosacea.  Treatment can improve symptoms and  may stop the progression of the rosacea.  Treatment may involve both topical and oral medications.  The tetracycline antibiotics are often used for their anti-inflammatory effect; however, because of the possibility of developing antibiotic resistance, they should not be used long term at full dose.  For dilated blood vessels the options include electrodessication (uses electric current through a small needle), laser treatment, and cosmetics to hide the redness.   With all forms of treatment, improvement is a slow process, and patients may not see any results for the first 3-4 weeks.  It is very important to avoid the sun and other triggers.  Patients must wear sunscreen daily.  Skin Care Instructions: Cleanse the skin with a mild soap such as CeraVe cleanser, Cetaphil cleanser, or Dove soap once or twice daily as needed. Moisturize with Eucerin Redness Relief Daily Perfecting Lotion (has a subtle green tint), CeraVe Moisturizing Cream, or Oil of Olay Daily Moisturizer with sunscreen every morning and/or night as recommended. Makeup should be non-comedogenic (wont clog pores) and be labeled for sensitive skin. Good choices for cosmetics are: Neutrogena, Almay, and Physicians Formula.  Any product with a green tint tends to offset a red complexion. If your eyes are dry and irritated, use artificial tears 2-3 times per day and cleanse the eyelids daily with baby shampoo.  Have your eyes examined at least every 2 years.  Be sure to tell your eye doctor that you have rosacea. Alcoholic beverages tend to cause flushing of the skin, and may make rosacea  worse. Always wear sunscreen, protect your skin from extreme hot and cold temperatures, and avoid spicy foods, hot drinks, and mechanical irritation such as rubbing, scrubbing, or massaging the face.  Avoid harsh skin cleansers, cleansing masks, astringents, and exfoliation. If a particular product burns or makes your face feel tight, then it is likely to  flare your rosacea. If you are having difficulty finding a sunscreen that you can tolerate, you may try switching to a chemical-free sunscreen.  These are ones whose active ingredient is zinc oxide or titanium dioxide only.  They should also be fragrance free, non-comedogenic, and labeled for sensitive skin. Rosacea triggers may vary from person to person.  There are a variety of foods that have been reported to trigger rosacea.  Some patients find that keeping a diary of what they were doing when they flared helps them avoid triggers.    If You Need Anything After Your Visit  If you have any questions or concerns for your doctor, please call our main line at [phone] and press option 4 to reach your doctor's medical assistant. If no one answers, please leave a voicemail as directed and we will return your call as soon as possible. Messages left after 4 pm will be answered the following business day.   You may also send [place] a message via [place]. We typically respond to MyChart messages within 1-2 business days.  For prescription refills, please ask your pharmacy to contact our office. Our fax number is [phone].  If you have an urgent issue when the clinic is closed that cannot wait until the next business day, you can page your doctor at the number below.    Please note that while we do our best to be available for urgent issues outside of office hours, we are not available 24/7.   If you have an urgent issue and are unable to reach [place], you may choose to seek medical care at your doctor's office, retail clinic, urgent care center, or emergency room.  If you have a medical emergency, please immediately call 911 or go to the emergency department.  Pager Numbers  - Dr. Nehemiah Massed: [phone]  - Dr. Laurence Ferrari: [phone]  - Dr. Nicole Kindred: [phone]  In the event of inclement weather, please call our main line at [phone] for an update on the status of any delays or  closures.  Dermatology Medication Tips: Please keep the boxes that topical medications come in in order to help keep track of the instructions about where and how to use these. Pharmacies typically print the medication instructions only on the boxes and not directly on the medication tubes.   If your medication is too expensive, please contact our office at [phone] option 4 or send [place] a message through [place].   We are unable to tell what your co-pay for medications will be in advance as this is different depending on your insurance coverage. However, we may be able to find a substitute medication at lower cost or fill out paperwork to get insurance to cover a needed medication.   If a prior authorization is required to get your medication covered by your insurance company, please allow [place] 1-2 business days to complete this process.  Drug prices often vary depending on where the prescription is filled and some pharmacies may offer cheaper prices.  The website www.goodrx.com contains coupons for medications through different pharmacies. The prices here do not account for what the cost may be with help from insurance (it may  be cheaper with your insurance), but the website can give you the price if you did not use any insurance.  - You can print the associated coupon and take it with your prescription to the pharmacy.  - You may also stop by our office during regular business hours and pick up a GoodRx coupon card.  - If you need your prescription sent electronically to a different pharmacy, notify our office through [facility] or by phone at [phone] option 4.     Si Usted Necesita Algo Despus de Su Visita  Tambin puede enviarnos un mensaje a travs de Pharmacist, community. Por lo general respondemos a los mensajes de MyChart en el transcurso de 1 a 2 das hbiles.  Para renovar recetas, por favor pida a su farmacia que se ponga en contacto con nuestra oficina. Harland Dingwall de fax  es [place] [phone].  Si tiene un asunto urgente cuando la clnica est cerrada y que no puede esperar hasta el siguiente da hbil, puede llamar/localizar a su doctor(a) al nmero que aparece a continuacin.   Por favor, tenga en cuenta que aunque hacemos todo lo posible para estar disponibles para asuntos urgentes fuera del horario de [place], no estamos disponibles las 24 horas del da, los 7 das de la [place].   Si tiene un problema urgente y no puede comunicarse con nosotros, puede optar por buscar atencin mdica  en el consultorio de su doctor(a), en una clnica privada, en un centro de atencin urgente o en una sala de emergencias.  Si tiene Engineering geologist, por favor llame inmediatamente al 911 o vaya a la sala de emergencias.  Nmeros de bper  - Dr. Nehemiah Massed: [phone]  - Dra. Moye: [phone]  - Dra. Nicole Kindred: [phone]  En caso de inclemencias del [place], por favor llame a Johnsie Kindred principal al [phone] para una actualizacin sobre el [place] de cualquier retraso o cierre.  Consejos para la medicacin en dermatologa: Por favor, guarde las cajas en las que vienen los medicamentos de uso tpico para ayudarle a seguir las instrucciones sobre dnde y cmo usarlos. Las farmacias generalmente imprimen las instrucciones del medicamento slo en las cajas y no directamente en los tubos del [place].   Si su medicamento es muy caro, por favor, pngase en contacto con Zigmund Daniel llamando al [phone] y presione la opcin 4 o envenos un mensaje a travs de Pharmacist, community.   No podemos decirle cul ser su copago por los medicamentos por adelantado ya que esto es diferente dependiendo de la cobertura de su seguro. Sin embargo, es posible que podamos encontrar un medicamento sustituto a Electrical engineer un formulario para que el seguro cubra el medicamento que se considera necesario.   Si se requiere una autorizacin previa para que su compaa de seguros [place]  su medicamento, por favor permtanos de 1 a 2 das hbiles para completar este proceso.  Los precios de los medicamentos varan con frecuencia dependiendo del Environmental consultant de dnde se surte la receta y alguna farmacias pueden ofrecer precios ms baratos.  El sitio web www.goodrx.com tiene cupones para medicamentos de Airline pilot. Los precios aqu no tienen en cuenta lo que podra costar con la ayuda del seguro (puede ser ms barato con su seguro), pero el sitio web puede darle el precio si no utiliz Research scientist (physical sciences).  - Puede imprimir el cupn correspondiente y llevarlo con su receta a la farmacia.  - Tambin puede pasar por nuestra oficina durante el horario de atencin regular y  recoger una tarjeta de cupones de GoodRx.  - Si necesita que su receta se enve electrnicamente a una farmacia diferente, informe a nuestra oficina a travs de MyChart de [place] o por telfono llamando al [phone] y presione la opcin 4.

## 2021-09-19 NOTE — Progress Notes (Signed)
Follow-Up Visit   Subjective  Laura Hale is a 76 y.o. female who presents for the following: Rosacea (Face. Using Metronidazole 0.75% cream at bedtime since October. Worsening, spreading. Causes some swelling on face at times. Itching./ PCP advised to not use on upper lip. ).  She also has h/o Seb derm of scalp.  Uses [facility] H&S shampoo.    The following portions of the chart were reviewed this encounter and updated as appropriate:      Review of Systems: No other skin or systemic complaints except as noted in HPI or Assessment and Plan.   Objective  Well appearing patient in no apparent distress; mood and affect are within normal limits.  A focused examination was performed including face, neck. Relevant physical exam findings are noted in the Assessment and Plan.  face Mid face erythema with telangiectasias. Diffuse small pink macules and papules on cheeks, mid face, forehead, upper lip  face Pink patches with mild scale at eyebrows, upper lip   Assessment & Plan  Rosacea face  Chronic and persistent condition with duration or expected duration over one year. Condition is bothersome/symptomatic for patient. Currently flared.   Rosacea is a chronic progressive skin condition usually affecting the face of adults, causing redness and/or acne bumps. It is treatable but not curable. It sometimes affects the eyes (ocular rosacea) as well. It may respond to topical and/or systemic medication and can flare with stress, sun exposure, alcohol, exercise and some foods.  Daily application of broad spectrum spf 30+ sunscreen to face is recommended to reduce flares.  Start Oracea 40mg  PO once daily with food.  Start Soolantra cream at bedtime. Call if not covered, will send to [facility] for rosacea triple cream.   Can continue Metronidazole 0.75% cream to face in morning.  doxycycline (ORACEA) 40 MG capsule - face Take 1 capsule (40 mg total) by mouth daily.  Ivermectin  (SOOLANTRA) 1 % CREA - face Apply QHS to face  Related Medications metroNIDAZOLE (METROCREAM) 0.75 % cream Apply topically at bedtime. Qhs to face  Seborrheic dermatitis face  Chronic and persistent condition with duration or expected duration over one year. Condition is symptomatic/ bothersome to patient. Not currently at goal.  Start Elidel (pimecrolimus) cream twice daily to itchy/scaly areas on face.   Continue H&S shampoo and fluocinolone oil as needed, as directed.  Seborrheic Dermatitis  -  is a chronic persistent rash characterized by pinkness and scaling most commonly of the mid face but also can occur on the scalp (dandruff), ears; mid chest, mid back and groin.  It tends to be exacerbated by stress and cooler weather.  People who have neurologic disease may experience new onset or exacerbation of existing seborrheic dermatitis.  The condition is not curable but treatable and can be controlled.   pimecrolimus (ELIDEL) 1 % cream - face Apply BID to itchy scaly areas on face  Related Medications Fluocinolone Acetonide 0.01 % OIL Place 1 application in ear(s) as directed. Apply 1-2 gtts to itchy ears qd to bid prn flares   Seborrheic Keratoses - Stuck-on, waxy, yellow-tan and flesh papules face - Benign-appearing - Discussed benign etiology and prognosis. - Observe - Call for any changes  Return in about 2 months (around 11/17/2021) for Rosace Follow Up.  Documentation: I have reviewed the above documentation for accuracy and completeness, and I agree with the above.  Brendolyn Patty MD  I, Emelia Salisbury, CMA, am acting as scribe for Brendolyn Patty, MD.

## 2021-09-21 ENCOUNTER — Ambulatory Visit: Payer: PPO | Admitting: Physical Therapy

## 2021-09-21 ENCOUNTER — Encounter: Payer: Self-pay | Admitting: Physical Therapy

## 2021-09-21 ENCOUNTER — Other Ambulatory Visit: Payer: Self-pay

## 2021-09-21 DIAGNOSIS — M25612 Stiffness of left shoulder, not elsewhere classified: Secondary | ICD-10-CM

## 2021-09-21 DIAGNOSIS — M546 Pain in thoracic spine: Secondary | ICD-10-CM

## 2021-09-21 DIAGNOSIS — M25611 Stiffness of right shoulder, not elsewhere classified: Secondary | ICD-10-CM

## 2021-09-21 NOTE — Therapy (Cosign Needed)
[facility] [address], [name], [address] Phone: [phone]   Fax:  [phone]  Physical Therapy Treatment  Patient Details  Name: Laura Hale MRN: [id number] Date of Birth: 16-Apr-1946 Referring Provider (PT): Caryl Comes [facility]   Encounter Date: 09/21/2021   PT End of Session - 09/24/21 0753     Visit Number 12    Number of Visits 17    Authorization - Visit Number 12    Authorization - Number of Visits 10    Activity Tolerance Patient tolerated treatment well    Behavior During Therapy [facility] for tasks assessed/performed             Past Medical History:  Diagnosis Date   Arthritis    elbow, hands, neck    Asthma    allergy induced asthma-only exacerbated by smells like perfume, smoke etc   Atypical childhood psychosis 2007   myoview -negative for ischemia with preserved LV function   Basal cell carcinoma ~2006   nasal tip   Colon polyps    Complication of anesthesia    WITH ACDF IN JUNE 2016-PT [place] X 10 WEEKS DUE TO INTUBATION   COPD (chronic obstructive pulmonary disease) ([place])    Difficult intubation    with last procedure.  couldn't talk due to tube   Edema    LEGS/FEET   Hypercalcemia 2009   calcium  WNL 10.2 on 04-2015   Hyperlipidemia    Obesity    Urinary incontinence     Past Surgical History:  Procedure Laterality Date   ABDOMINAL HYSTERECTOMY     one ovary remains   ANTERIOR CERVICAL DECOMP/DISCECTOMY FUSION N/A 01/31/2015   Procedure: ANTERIOR CERVICAL DECOMPRESSION/DISCECTOMY FUSION CERVICAL FIVE SIX;  Surgeon: Karie Chimera, MD;  Location: [facility];  Service: Neurosurgery;  Laterality: N/A;   BACK SURGERY     C 6-C7 neck    CATARACT EXTRACTION W/PHACO Left 02/06/2017   Procedure: CATARACT EXTRACTION PHACO AND INTRAOCULAR LENS PLACEMENT (IOC);  Surgeon: Eulogio Bear, MD;  Location: [facility];  Service: Ophthalmology;  Laterality: Left;  Lot#  [id number] H [place]: 00:37.9 AP%: 7.9 CDE: 2.97   CATARACT EXTRACTION W/PHACO Right 03/06/2017   Procedure: CATARACT EXTRACTION PHACO AND INTRAOCULAR LENS PLACEMENT (IOC);  Surgeon: Eulogio Bear, MD;  Location: [facility];  Service: Ophthalmology;  Laterality: Right;  Lot #[id number] H [place]: 00.22.0 AP%: 7.5 CDE: 1.64    COLONOSCOPY     COLONOSCOPY WITH PROPOFOL N/A 09/23/2017   Procedure: COLONOSCOPY WITH PROPOFOL;  Surgeon: Toledo, Benay Pike, MD;  Location: [facility];  Service: Gastroenterology;  Laterality: N/A;   ELBOW SURGERY Left 04/2003   x2  & for repair & then remove all hardware, due to injury from a fall   EYE SURGERY Bilateral    cataracts   FRACTURE SURGERY Right    shoulder and arm   FRACTURE SURGERY Left 2003   elbow   HAND SURGERY Right 2006   injury- then eventually lost remainder of thumb   KNEE ARTHROSCOPY WITH LATERAL MENISECTOMY Left 01/23/2016   Procedure: KNEE ARTHROSCOPY WITH LATERAL MENISECTOMY;  Surgeon: Corky Mull, MD;  Location: [facility];  Service: Orthopedics;  Laterality: Left;   ORIF HUMERUS FRACTURE Right 08/31/2015   Procedure: OPEN REDUCTION INTERNAL FIXATION (ORIF) PROXIMAL HUMERUS FRACTURE w/ #2 fberwire;  Surgeon: Corky Mull, MD;  Location: [facility];  Service: Orthopedics;  Laterality: Right;   THUMB AMPUTATION  2005  partial   TOTAL HIP ARTHROPLASTY Left 06/21/2020   Procedure: TOTAL HIP ARTHROPLASTY;  Surgeon: Dereck Leep, MD;  Location: [facility];  Service: Orthopedics;  Laterality: Left;    There were no vitals filed for this visit.   Subjective Assessment - 09/24/21 0752     Subjective pnt reports no pain and being compliant with HEP    Pertinent History Pt is a 76 year old female presenting with chronic bilat shoulder pain. Reports she was in an accident in 2003 where she broke her L elbow, and subsequently had frozen shoulder, and reports she has had off and on pain since then with most recent episode of increased pain a couple months  ago. Reports she had a fall 5 years ago and broke her R arm in 3 places and has had off and on pain since then in this shoulder, with most recent episode 3 months ago. Reports increased pain with insidious onset, just that she has not been moving as much. Points to pain at upper arm, inferior to GHJ that is anterior/lateral  of both Ue's, reports they feel the same but her pain can come on at different times in each arm. . Reports this pain feels like a muscle ache, and is 2/10 currently; worst pain 4/10, best 0/10. Reports pain is exacerbated by lifting/carrying, reaching behind her and overhead. Pt is R handed, retired, lives at home with her spouse in a one story home with 1 step to enter, is completing all ADLs ind with pain with bathing and dressing. She is ind with driving and community activity, previously enjoyed walking, but has difficulty doing this following hip replacement last year. Has had no falls in the past 6 months. Pt denies N/V, B&B changes, unexplained weight fluctuation, saddle paresthesia, fever, night sweats, or unrelenting night pain at this time.    Limitations Writing;Lifting;House hold activities    How long can you sit comfortably? unlimited    How long can you stand comfortably? unilimited    How long can you walk comfortably? unlimited by shoulders; due to hip replacement and fear of falling only 5 mins    Diagnostic tests none to date    Patient Stated Goals decrease shoulder pain, and increase mobility in bilat shoulders                    Therex  Nu-Step LE 10, UE 7- for increased protraction/retraction scapular mobility - 5 min Wall angels 2 x 12  Wall Slides on foam roller 3 x 12- min cueing for keeping eyes lifted ER with shoulder flexion with RTB 3 x 8 Standing low rows BTB  3 x 12- good carry over from last session Head circle with 6# weight for scapulothoracic rhythmical stabilization 3 x 6 both ways- min form cueing  Seated ball roll out- x12                           PT Short Term Goals - 09/15/21 1145       PT SHORT TERM GOAL #1   Title Pt will be independent with HEP in order to improve strength and decrease pain in order to improve pain-free function at home and work.    Baseline 08/10/21 HEP given; 09/14/21 Completing HEP regularly    Time 4    Period Weeks    Status Achieved  PT Long Term Goals - 09/17/21 1035       PT LONG TERM GOAL #1   Title Patient will demonstrate full bilat shoulder AROM in order to complete self care ADLs    Baseline 09/17/21 IR R: C4 L: C2,bilateral ER PSIS. abduction bilaterally 170 and flexion WNL; 08/09/21 R/L flexion 146/143; abd 140/142; ER C6/C2; IR R PSIS/L PSIS    Status Partially Met      PT LONG TERM GOAL #2   Title Pt will decrease worst pain as reported on NPRS by at least 3 points in order to demonstrate clinically significant reduction in pain.    Baseline 08/09/21 4/10. 09/17/21 0/10    Time 8    Period Weeks    Status Achieved      PT LONG TERM GOAL #3   Title Pt will increase periscapular strength of  to at least 4/5 MMT grade in order to demonstrate improvement in strength and function    Baseline 08/09/21 Y lower trap 3+/5 bilat; T scap retractors 4-/5 bilat. Y lower trap 4/5 bilaterally; scap retractors 4/5 bilaterally    Time 8    Period Weeks    Status Achieved      PT LONG TERM GOAL #4   Title Patient will increase FOTO score to 65 to demonstrate predicted increase in functional mobility to complete ADLs    Baseline 08/09/21 42; 09/17/21 57    Time 8    Period Weeks    Status On-going                   Plan - 09/24/21 0754     Clinical Impression Statement pnt presented with no pain today. SPT progressed therex to include functional bilateral UE strength. pnt maintained good motivation throughout session and was able to comply to min therex form cueing. pnt demonstrated and verbalized understanding of therex  instruction. PT will progress as able    Personal Factors and Comorbidities Comorbidity 3+;Past/Current Experience;Time since onset of injury/illness/exacerbation;Fitness    Comorbidities COPD, arthritis, HLD, chronic pain    Examination-Activity Limitations Reach Overhead;Lift;Carry;Hygiene/Grooming;Dressing    Examination-Participation Restrictions Cleaning;Community Activity;Driving;Meal Prep    Stability/Clinical Decision Making Evolving/Moderate complexity    Rehab Potential Good    PT Frequency 1x / week    PT Duration 8 weeks    PT Treatment/Interventions Electrical Stimulation;Moist Heat;DME Instruction;Traction;Ultrasound;Cryotherapy;Iontophoresis 58m/ml Dexamethasone;Therapeutic exercise;Functional mobility training;Therapeutic activities;Neuromuscular re-education;Manual techniques;Patient/family education;Passive range of motion;Dry needling;Splinting;Taping;Joint Manipulations;Spinal Manipulations    PT Next Visit Plan posture education    PT Home Exercise Plan scapular retractions with BTB    Consulted and Agree with Plan of Care Patient             Patient will benefit from skilled therapeutic intervention in order to improve the following deficits and impairments:  Decreased activity tolerance, Decreased endurance, Decreased strength, Increased fascial restricitons, Impaired UE functional use, Improper body mechanics, Pain, Postural dysfunction, Impaired tone, Impaired flexibility, Decreased mobility, Decreased safety awareness  Visit Diagnosis: Stiffness of right shoulder, not elsewhere classified  Stiffness of left shoulder, not elsewhere classified  Pain in thoracic spine     Problem List Patient Active Problem List   Diagnosis Date Noted   Stiffness of left shoulder, not elsewhere classified 08/15/2021   Hx of total hip arthroplasty, left 06/21/2020   [place]   Hyperglycemia 06/18/2020   Hyperlipidemia 06/18/2020   Urinary  incontinence, mixed 06/18/2020   Pulmonary nodules 06/07/2020   Avascular necrosis of  femoral head, left ([place]) 04/02/2020   Primary osteoarthritis of left hip 04/02/2020   Numbness 08/19/2019   Ataxia 07/15/2019   Bilateral carpal tunnel syndrome 07/15/2019   Obstructive sleep apnea 06/19/2017   BMI 40.0-44.9, adult ([place]) 04/29/2016   Primary osteoarthritis of left knee 01/19/2016   Rotator cuff tendinitis, left 11/20/2015   Complex tear of lateral meniscus of right knee as current injury 11/20/2015   Primary osteoarthritis of right knee 11/20/2015   Essential hypertension 10/19/2015   Chronic pain of both shoulders 07/20/2015   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   Neuropathy 04/13/2014   Vitamin D deficiency 10/15/2013     Durwin Reges DPT Claiborne Billings O'Daniel, SPT Durwin Reges, PT 09/24/2021, 7:55 AM  [facility] [address] [name], [address] Phone: [phone]   Fax:  [phone]  Name: ELEINA JERGENS MRN: [id number] Date of Birth: [birth year]

## 2021-09-24 ENCOUNTER — Ambulatory Visit: Payer: PPO | Admitting: Physical Therapy

## 2021-09-28 ENCOUNTER — Other Ambulatory Visit: Payer: Self-pay

## 2021-09-28 ENCOUNTER — Encounter: Payer: Self-pay | Admitting: Physical Therapy

## 2021-09-28 ENCOUNTER — Ambulatory Visit: Payer: PPO | Admitting: Physical Therapy

## 2021-09-28 DIAGNOSIS — M25612 Stiffness of left shoulder, not elsewhere classified: Secondary | ICD-10-CM

## 2021-09-28 DIAGNOSIS — M25611 Stiffness of right shoulder, not elsewhere classified: Secondary | ICD-10-CM

## 2021-09-28 NOTE — Therapy (Signed)
[place] [facility] [address], [name], [address] Phone: [phone]   Fax:  [phone]  Physical Therapy Treatment  Patient Details  Name: Laura Hale MRN: [id number] Date of Birth: Dec 04, 1945 Referring Provider (PT): Caryl Comes [facility]   Encounter Date: 09/28/2021   PT End of Session - 09/28/21 1102     Visit Number 13    Number of Visits 17    Date for PT Re-Evaluation 10/05/21    Authorization - Visit Number 13    Authorization - Number of Visits 10    PT Start Time 1751    PT Stop Time 1100    PT Time Calculation (min) 45 min    Activity Tolerance Patient tolerated treatment well    Behavior During Therapy [facility] for tasks assessed/performed             Past Medical History:  Diagnosis Date   Arthritis    elbow, hands, neck    Asthma    allergy induced asthma-only exacerbated by smells like perfume, smoke etc   Atypical childhood psychosis 2007   myoview -negative for ischemia with preserved LV function   Basal cell carcinoma ~2006   nasal tip   Colon polyps    Complication of anesthesia    WITH ACDF IN JUNE 2016-PT [place] X 10 WEEKS DUE TO INTUBATION   COPD (chronic obstructive pulmonary disease) ([place])    Difficult intubation    with last procedure.  couldn't talk due to tube   Edema    LEGS/FEET   Hypercalcemia 2009   calcium  WNL 10.2 on 04-2015   Hyperlipidemia    Obesity    Urinary incontinence     Past Surgical History:  Procedure Laterality Date   ABDOMINAL HYSTERECTOMY     one ovary remains   ANTERIOR CERVICAL DECOMP/DISCECTOMY FUSION N/A 01/31/2015   Procedure: ANTERIOR CERVICAL DECOMPRESSION/DISCECTOMY FUSION CERVICAL FIVE SIX;  Surgeon: Karie Chimera, MD;  Location: [place] [facility];  Service: Neurosurgery;  Laterality: N/A;   BACK SURGERY     C 6-C7 neck    CATARACT EXTRACTION W/PHACO Left 02/06/2017   Procedure: CATARACT EXTRACTION PHACO AND INTRAOCULAR LENS  PLACEMENT (IOC);  Surgeon: Eulogio Bear, MD;  Location: [facility] ORS;  Service: Ophthalmology;  Laterality: Left;  Lot# [id number] H [place]: 00:37.9 AP%: 7.9 CDE: 2.97   CATARACT EXTRACTION W/PHACO Right 03/06/2017   Procedure: CATARACT EXTRACTION PHACO AND INTRAOCULAR LENS PLACEMENT (IOC);  Surgeon: Eulogio Bear, MD;  Location: [facility] ORS;  Service: Ophthalmology;  Laterality: Right;  Lot #[id number] H [place]: 00.22.0 AP%: 7.5 CDE: 1.64    COLONOSCOPY     COLONOSCOPY WITH PROPOFOL N/A 09/23/2017   Procedure: COLONOSCOPY WITH PROPOFOL;  Surgeon: Toledo, Benay Pike, MD;  Location: [facility] ENDOSCOPY;  Service: Gastroenterology;  Laterality: N/A;   ELBOW SURGERY Left 04/2003   x2  & for repair & then remove all hardware, due to injury from a fall   EYE SURGERY Bilateral    cataracts   FRACTURE SURGERY Right    shoulder and arm   FRACTURE SURGERY Left 2003   elbow   HAND SURGERY Right 2006   injury- then eventually lost remainder of thumb   KNEE ARTHROSCOPY WITH LATERAL MENISECTOMY Left 01/23/2016   Procedure: KNEE ARTHROSCOPY WITH LATERAL MENISECTOMY;  Surgeon: Corky Mull, MD;  Location: [facility] ORS;  Service: Orthopedics;  Laterality: Left;   ORIF HUMERUS FRACTURE Right 08/31/2015   Procedure: OPEN REDUCTION  INTERNAL FIXATION (ORIF) PROXIMAL HUMERUS FRACTURE w/ #2 fberwire;  Surgeon: Corky Mull, MD;  Location: [facility] ORS;  Service: Orthopedics;  Laterality: Right;   THUMB AMPUTATION  2005   partial   TOTAL HIP ARTHROPLASTY Left 06/21/2020   Procedure: TOTAL HIP ARTHROPLASTY;  Surgeon: Dereck Leep, MD;  Location: [facility] ORS;  Service: Orthopedics;  Laterality: Left;    There were no vitals filed for this visit.   Subjective Assessment - 09/28/21 1018     Subjective Pnt reports pain in both shoulers- pnt reports 6/10 pain on NPS and feeling like her shoulder is "shifting" while she moves or sits in the car.    Pertinent History Pt is a 76 year old female presenting with chronic bilat shoulder pain.  Reports she was in an accident in 2003 where she broke her L elbow, and subsequently had frozen shoulder, and reports she has had off and on pain since then with most recent episode of increased pain a couple months ago. Reports she had a fall 5 years ago and broke her R arm in 3 places and has had off and on pain since then in this shoulder, with most recent episode 3 months ago. Reports increased pain with insidious onset, just that she has not been moving as much. Points to pain at upper arm, inferior to GHJ that is anterior/lateral  of both Ue's, reports they feel the same but her pain can come on at different times in each arm. . Reports this pain feels like a muscle ache, and is 2/10 currently; worst pain 4/10, best 0/10. Reports pain is exacerbated by lifting/carrying, reaching behind her and overhead. Pt is R handed, retired, lives at home with her spouse in a one story home with 1 step to enter, is completing all ADLs ind with pain with bathing and dressing. She is ind with driving and community activity, previously enjoyed walking, but has difficulty doing this following hip replacement last year. Has had no falls in the past 6 months. Pt denies N/V, B&B changes, unexplained weight fluctuation, saddle paresthesia, fever, night sweats, or unrelenting night pain at this time.    Limitations Writing;Lifting;House hold activities    How long can you sit comfortably? unlimited    How long can you stand comfortably? unilimited    How long can you walk comfortably? unlimited by shoulders; due to hip replacement and fear of falling only 5 mins    Diagnostic tests none to date    Patient Stated Goals decrease shoulder pain, and increase mobility in bilat shoulders    Currently in Pain? Yes    Pain Score 6     Pain Location Arm    Pain Orientation Right;Upper    Pain Descriptors / Indicators Aching    Pain Type Chronic pain    Pain Onset More than a month ago    Pain Frequency Intermittent     Aggravating Factors  reaching overhead, lifting    Pain Relieving Factors rest    Effect of Pain on Daily Activities unable to complete ADLs without pain    Multiple Pain Sites Yes    Pain Score 6    Pain Location Arm    Pain Orientation Left;Anterior    Pain Descriptors / Indicators Aching    Pain Type Chronic pain    Pain Onset More than a month ago    Pain Frequency Intermittent    Aggravating Factors  overhead activites , reaching    Pain Relieving  Factors rest    Effect of Pain on Daily Activities unable to complete ADLs without pain              Therex: Nu Step for cueing of gentle protraction/retraction of scapula for 5 min  Wall Angles x12 - min TC to promote elbow and hand contact with wall  Wall Slides with foam roller 3 x 12 - min verbal cueing to follow hands with eyes for full cervical and thoracic extension  Banded ER with flexion RTB 3 x 10- good carry over from last session Overhead functional Halo with 6lb weight 3 x 6 bilateral ways- good carry over form last session  Standing rows 10lb 3 x 12- mod verbal cueing for eccentric control  Standing D2 Flexion with YTB 3 x 6- mod verbal and TC for sequencing             PT Education - 09/28/21 1100     Education Details therex form and sequencing    Person(s) Educated Patient    Methods Explanation;Demonstration;Tactile cues;Verbal cues    Comprehension Verbalized understanding;Returned demonstration              PT Short Term Goals - 09/15/21 1145       PT SHORT TERM GOAL #1   Title Pt will be independent with HEP in order to improve strength and decrease pain in order to improve pain-free function at home and work.    Baseline 08/10/21 HEP given; 09/14/21 Completing HEP regularly    Time 4    Period Weeks    Status Achieved               PT Long Term Goals - 09/17/21 1035       PT LONG TERM GOAL #1   Title Patient will demonstrate full bilat shoulder AROM in order to complete self  care ADLs    Baseline 09/17/21 IR R: C4 L: C2,bilateral ER PSIS. abduction bilaterally 170 and flexion WNL; 08/09/21 R/L flexion 146/143; abd 140/142; ER C6/C2; IR R PSIS/L PSIS    Status Partially Met      PT LONG TERM GOAL #2   Title Pt will decrease worst pain as reported on NPRS by at least 3 points in order to demonstrate clinically significant reduction in pain.    Baseline 08/09/21 4/10. 09/17/21 0/10    Time 8    Period Weeks    Status Achieved      PT LONG TERM GOAL #3   Title Pt will increase periscapular strength of  to at least 4/5 MMT grade in order to demonstrate improvement in strength and function    Baseline 08/09/21 Y lower trap 3+/5 bilat; T scap retractors 4-/5 bilat. Y lower trap 4/5 bilaterally; scap retractors 4/5 bilaterally    Time 8    Period Weeks    Status Achieved      PT LONG TERM GOAL #4   Title Patient will increase FOTO score to 65 to demonstrate predicted increase in functional mobility to complete ADLs    Baseline 08/09/21 42; 09/17/21 57    Time 8    Period Weeks    Status On-going                   Plan - 09/28/21 1102     Clinical Impression Statement Pnt presented with no pain today and SPT progressed therex to include rotator cuff endurance in functional planes of movment. Pnt was able to comply with  mod TC and verbal cueing.  SPT discussed future discharge plans with pnt and reassessing goals at next visit. Pnt maintained good motivation throughout session and demonstrated understanding of therex cueing. Pnt verbalized enjoyment progressing into more standing and functional movments applicabale to her ADLs. PT will continue as able.    Personal Factors and Comorbidities Comorbidity 3+;Past/Current Experience;Time since onset of injury/illness/exacerbation;Fitness    Comorbidities COPD, arthritis, HLD, chronic pain    Examination-Activity Limitations Reach Overhead;Lift;Carry;Hygiene/Grooming;Dressing    Examination-Participation  Restrictions Cleaning;Community Activity;Driving;Meal Prep    Stability/Clinical Decision Making Evolving/Moderate complexity    Clinical Decision Making Moderate    Rehab Potential Good    PT Frequency 1x / week    PT Duration 8 weeks    PT Treatment/Interventions Electrical Stimulation;Moist Heat;DME Instruction;Traction;Ultrasound;Cryotherapy;Iontophoresis 49m/ml Dexamethasone;Therapeutic exercise;Functional mobility training;Therapeutic activities;Neuromuscular re-education;Manual techniques;Patient/family education;Passive range of motion;Dry needling;Splinting;Taping;Joint Manipulations;Spinal Manipulations    PT Next Visit Plan posture education    PT Home Exercise Plan scapular retractions with BTB    Consulted and Agree with Plan of Care Patient             Patient will benefit from skilled therapeutic intervention in order to improve the following deficits and impairments:  Decreased activity tolerance, Decreased endurance, Decreased strength, Increased fascial restricitons, Impaired UE functional use, Improper body mechanics, Pain, Postural dysfunction, Impaired tone, Impaired flexibility, Decreased mobility, Decreased safety awareness  Visit Diagnosis: Stiffness of right shoulder, not elsewhere classified  Stiffness of left shoulder, not elsewhere classified     Problem List Patient Active Problem List   Diagnosis Date Noted   Stiffness of left shoulder, not elsewhere classified 08/15/2021   Hx of total hip arthroplasty, left 06/21/2020   COPD with asthma ([place] 06/18/2020   Hyperglycemia 06/18/2020   Hyperlipidemia 06/18/2020   Urinary incontinence, mixed 06/18/2020   Pulmonary nodules 06/07/2020   Avascular necrosis of femoral head, left ([place] 04/02/2020   Primary osteoarthritis of left hip 04/02/2020   Numbness 08/19/2019   Ataxia 07/15/2019   Bilateral carpal tunnel syndrome 07/15/2019   Obstructive sleep apnea 06/19/2017   BMI 40.0-44.9, adult ([place]  04/29/2016   Primary osteoarthritis of left knee 01/19/2016   Rotator cuff tendinitis, left 11/20/2015   Complex tear of lateral meniscus of right knee as current injury 11/20/2015   Primary osteoarthritis of right knee 11/20/2015   Essential hypertension 10/19/2015   Chronic pain of both shoulders 07/20/2015   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   Neuropathy 04/13/2014   Vitamin D deficiency 10/15/2013   KClaiborne BillingsO'Daniel, SPT  KPatrina LeveringPT, DPT   [place] [facility] [address] [place] [address]Phone: [phone]  Fax:  [phone] Name: LYURIDIA COUTSMRN: [id number] of Birth: 421-Mar-1947

## 2021-10-01 ENCOUNTER — Encounter: Payer: Self-pay | Admitting: Physical Therapy

## 2021-10-01 ENCOUNTER — Other Ambulatory Visit: Payer: Self-pay

## 2021-10-01 ENCOUNTER — Ambulatory Visit: Payer: PPO | Admitting: Physical Therapy

## 2021-10-01 DIAGNOSIS — M25612 Stiffness of left shoulder, not elsewhere classified: Secondary | ICD-10-CM

## 2021-10-01 DIAGNOSIS — M25611 Stiffness of right shoulder, not elsewhere classified: Secondary | ICD-10-CM

## 2021-10-01 NOTE — Therapy (Signed)
[place] [facility] [address], [name], [address] Phone: [phone]   Fax:  [phone]  Physical Therapy D/C Reporting Period (09/14/21- 10/01/21)  Patient Details  Name: Laura Hale MRN: [id number] Date of Birth: 07-Jun-1946 Referring Provider (PT): Caryl Comes [facility]   Encounter Date: 10/01/2021   PT End of Session - 10/01/21 1529     Visit Number 14    Number of Visits 17    Date for PT Re-Evaluation 10/05/21    Authorization - Visit Number 14    Authorization - Number of Visits 10    PT Start Time 1430    PT Stop Time 1916    PT Time Calculation (min) 45 min    Activity Tolerance Patient tolerated treatment well    Behavior During Therapy [facility] for tasks assessed/performed             Past Medical History:  Diagnosis Date   Arthritis    elbow, hands, neck    Asthma    allergy induced asthma-only exacerbated by smells like perfume, smoke etc   Atypical childhood psychosis 2007   myoview -negative for ischemia with preserved LV function   Basal cell carcinoma ~2006   nasal tip   Colon polyps    Complication of anesthesia    WITH ACDF IN JUNE 2016-PT STATES SHE LOST HER VOICE X 10 WEEKS DUE TO INTUBATION   COPD (chronic obstructive pulmonary disease) ([place])    Difficult intubation    with last procedure.  couldn't talk due to tube   Edema    LEGS/FEET   Hypercalcemia 2009   calcium  WNL 10.2 on 04-2015   Hyperlipidemia    Obesity    Urinary incontinence     Past Surgical History:  Procedure Laterality Date   ABDOMINAL HYSTERECTOMY     one ovary remains   ANTERIOR CERVICAL DECOMP/DISCECTOMY FUSION N/A 01/31/2015   Procedure: ANTERIOR CERVICAL DECOMPRESSION/DISCECTOMY FUSION CERVICAL FIVE SIX;  Surgeon: Karie Chimera, MD;  Location: [place] [facility];  Service: Neurosurgery;  Laterality: N/A;   BACK SURGERY     C 6-C7 neck    CATARACT EXTRACTION W/PHACO Left 02/06/2017   Procedure: CATARACT  EXTRACTION PHACO AND INTRAOCULAR LENS PLACEMENT (IOC);  Surgeon: Eulogio Bear, MD;  Location: [facility] ORS;  Service: Ophthalmology;  Laterality: Left;  Lot# [id number] H [place]: 00:37.9 AP%: 7.9 CDE: 2.97   CATARACT EXTRACTION W/PHACO Right 03/06/2017   Procedure: CATARACT EXTRACTION PHACO AND INTRAOCULAR LENS PLACEMENT (IOC);  Surgeon: Eulogio Bear, MD;  Location: [facility] ORS;  Service: Ophthalmology;  Laterality: Right;  Lot #[id number] H [place]: 00.22.0 AP%: 7.5 CDE: 1.64    COLONOSCOPY     COLONOSCOPY WITH PROPOFOL N/A 09/23/2017   Procedure: COLONOSCOPY WITH PROPOFOL;  Surgeon: Toledo, Benay Pike, MD;  Location: [facility];  Service: Gastroenterology;  Laterality: N/A;   ELBOW SURGERY Left 04/2003   x2  & for repair & then remove all hardware, due to injury from a fall   EYE SURGERY Bilateral    cataracts   FRACTURE SURGERY Right    shoulder and arm   FRACTURE SURGERY Left 2003   elbow   HAND SURGERY Right 2006   injury- then eventually lost remainder of thumb   KNEE ARTHROSCOPY WITH LATERAL MENISECTOMY Left 01/23/2016   Procedure: KNEE ARTHROSCOPY WITH LATERAL MENISECTOMY;  Surgeon: Corky Mull, MD;  Location: [facility] ORS;  Service: Orthopedics;  Laterality: Left;   ORIF HUMERUS FRACTURE Right 08/31/2015  Procedure: OPEN REDUCTION INTERNAL FIXATION (ORIF) PROXIMAL HUMERUS FRACTURE w/ #2 fberwire;  Surgeon: Corky Mull, MD;  Location: [facility];  Service: Orthopedics;  Laterality: Right;   THUMB AMPUTATION  2005   partial   TOTAL HIP ARTHROPLASTY Left 06/21/2020   Procedure: TOTAL HIP ARTHROPLASTY;  Surgeon: Dereck Leep, MD;  Location: [facility] ORS;  Service: Orthopedics;  Laterality: Left;    There were no vitals filed for this visit.   Subjective Assessment - 10/01/21 1433     Subjective Pnt reports no pain and being compliant with HEP. Pnt feeling ready for d/c and reports feeling 75% better.    Pertinent History Pt is a 76 year old female presenting with chronic bilat shoulder pain.  Reports she was in an accident in 2003 where she broke her L elbow, and subsequently had frozen shoulder, and reports she has had off and on pain since then with most recent episode of increased pain a couple months ago. Reports she had a fall 5 years ago and broke her R arm in 3 places and has had off and on pain since then in this shoulder, with most recent episode 3 months ago. Reports increased pain with insidious onset, just that she has not been moving as much. Points to pain at upper arm, inferior to GHJ that is anterior/lateral  of both Ue's, reports they feel the same but her pain can come on at different times in each arm. . Reports this pain feels like a muscle ache, and is 2/10 currently; worst pain 4/10, best 0/10. Reports pain is exacerbated by lifting/carrying, reaching behind her and overhead. Pt is R handed, retired, lives at home with her spouse in a one story home with 1 step to enter, is completing all ADLs ind with pain with bathing and dressing. She is ind with driving and community activity, previously enjoyed walking, but has difficulty doing this following hip replacement last year. Has had no falls in the past 6 months. Pt denies N/V, B&B changes, unexplained weight fluctuation, saddle paresthesia, fever, night sweats, or unrelenting night pain at this time.    Limitations Writing;Lifting;House hold activities    How long can you sit comfortably? unlimited    How long can you stand comfortably? unilimited    How long can you walk comfortably? unlimited by shoulders; due to hip replacement and fear of falling only 5 mins    Diagnostic tests none to date    Patient Stated Goals decrease shoulder pain, and increase mobility in bilat shoulders    Currently in Pain? No/denies    Pain Score 0-No pain             Therex: Nu Step for cueing of gentle protraction/retraction of scapula for 5 min  Wall Angles x12 - min TC to promote elbow and hand contact with wall  Bent over rows  #4 3 x 12 - min cueing for scapular retraction Standing rows 10lb 3 x 12- mod verbal cueing for eccentric control  Standing Lat Pull downs 20# 3 x 8- mod verbal cueing for eccentric control and recruitment of lower trap Overhead functional Halo with 6lb weight 3 x 6 bilateral ways- good carry over form last session    Pnt educated on parameters for HEP progression including reps/sets depending on strength VS endurance goals                   PT Education - 10/01/21 1526     Education Details  therex form and HEP program    Person(s) Educated Patient    Methods Explanation;Demonstration;Tactile cues;Verbal cues    Comprehension Verbalized understanding;Returned demonstration              PT Short Term Goals - 09/15/21 1145       PT SHORT TERM GOAL #1   Title Pt will be independent with HEP in order to improve strength and decrease pain in order to improve pain-free function at home and work.    Baseline 08/10/21 HEP given; 09/14/21 Completing HEP regularly    Time 4    Period Weeks    Status Achieved               PT Long Term Goals - 10/01/21 1437       PT LONG TERM GOAL #1   Title Patient will demonstrate full bilat shoulder AROM in order to complete self care ADLs    Baseline 09/17/21 IR R: C4 L: C2,bilateral ER PSIS. abduction bilaterally 170 and flexion WNL; 08/09/21 R/L flexion 146/143; abd 140/142; ER C6/C2; IR R PSIS/L PSIS; 10/01/21: R/L flexion wnl ; abd bilateral approx 175; ER R: C6/L:C2; IR R:T2/L PSIS    Time 8    Period Weeks    Status Partially Met      PT LONG TERM GOAL #2   Title Pt will decrease worst pain as reported on NPRS by at least 3 points in order to demonstrate clinically significant reduction in pain.    Baseline 08/09/21 4/10. 09/17/21 0/10    Time 8    Period Weeks    Status Achieved      PT LONG TERM GOAL #3   Title Pt will increase periscapular strength of  to at least 4/5 MMT grade in order to demonstrate improvement in  strength and function    Baseline 08/09/21 Y lower trap 3+/5 bilat; T scap retractors 4-/5 bilat. Y lower trap 4/5 bilaterally; scap retractors 4/5 bilaterally    Time 8    Period Weeks    Status Achieved      PT LONG TERM GOAL #4   Title Patient will increase FOTO score to 65 to demonstrate predicted increase in functional mobility to complete ADLs    Baseline 08/09/21 42; 09/17/21 57; 10/01/21 63    Time 8    Period Weeks    Status Partially Met                   Plan - 10/01/21 1531     Clinical Impression Statement SPT reviewed pnt goals and pnt reduced pain down to 0/10 on NPS, increased FOTO from 42 to 63, increased lower trap MMT by a muscle grade, and reports ability to reach functionally for items on her shelf in her kitchen. Pnt deficits include IR of left shoulder.  SPT updated HEP to continue functional strength and postural endurance. Pnt demonstrated understanding of updated HEP and verbalized consent for d/c. SPT reviewed paramaters for increasing reps/sets for appropriate edurance and strength goals for pnt to continue to progress independently at home.    Personal Factors and Comorbidities Comorbidity 3+;Past/Current Experience;Time since onset of injury/illness/exacerbation;Fitness    Comorbidities COPD, arthritis, HLD, chronic pain    Examination-Activity Limitations Reach Overhead;Lift;Carry;Hygiene/Grooming;Dressing    Examination-Participation Restrictions Cleaning;Community Activity;Driving;Meal Prep    Stability/Clinical Decision Making Evolving/Moderate complexity    Clinical Decision Making Moderate    Rehab Potential Good    PT Frequency 1x / week    PT  Duration 8 weeks    PT Treatment/Interventions Electrical Stimulation;Moist Heat;DME Instruction;Traction;Ultrasound;Cryotherapy;Iontophoresis 27m/ml Dexamethasone;Therapeutic exercise;Functional mobility training;Therapeutic activities;Neuromuscular re-education;Manual techniques;Patient/family  education;Passive range of motion;Dry needling;Splinting;Taping;Joint Manipulations;Spinal Manipulations    PT Home Exercise Plan HEP: [id number](wall angels, bent over rows, standing rows, lat pull downs, halo with weight)    Consulted and Agree with Plan of Care Patient             Patient will benefit from skilled therapeutic intervention in order to improve the following deficits and impairments:  Decreased activity tolerance, Decreased endurance, Decreased strength, Increased fascial restricitons, Impaired UE functional use, Improper body mechanics, Pain, Postural dysfunction, Impaired tone, Impaired flexibility, Decreased mobility, Decreased safety awareness  Visit Diagnosis: Stiffness of right shoulder, not elsewhere classified  Stiffness of left shoulder, not elsewhere classified     Problem List Patient Active Problem List   Diagnosis Date Noted   Stiffness of left shoulder, not elsewhere classified 08/15/2021   Hx of total hip arthroplasty, left 06/21/2020   COPD with asthma ([place] 06/18/2020   Hyperglycemia 06/18/2020   Hyperlipidemia 06/18/2020   Urinary incontinence, mixed 06/18/2020   Pulmonary nodules 06/07/2020   Avascular necrosis of femoral head, left ([place] 04/02/2020   Primary osteoarthritis of left hip 04/02/2020   Numbness 08/19/2019   Ataxia 07/15/2019   Bilateral carpal tunnel syndrome 07/15/2019   Obstructive sleep apnea 06/19/2017   BMI 40.0-44.9, adult ([place] 04/29/2016   Primary osteoarthritis of left knee 01/19/2016   Rotator cuff tendinitis, left 11/20/2015   Complex tear of lateral meniscus of right knee as current injury 11/20/2015   Primary osteoarthritis of right knee 11/20/2015   Essential hypertension 10/19/2015   Chronic pain of both shoulders 07/20/2015   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   Neuropathy 04/13/2014   Vitamin D deficiency 10/15/2013    KClaiborne BillingsO'Daniel, SPT  KPatrina LeveringPT, DPT   [place] [facility] [address] [place] [address]Phone: [phone]  Fax:  [phone] Name: Laura SWADERMRN: [id number] of Birth: 406-22-1947

## 2021-10-05 ENCOUNTER — Ambulatory Visit: Payer: PPO | Admitting: Physical Therapy

## 2021-10-08 ENCOUNTER — Encounter: Payer: PPO | Admitting: Physical Therapy

## 2021-10-15 ENCOUNTER — Other Ambulatory Visit: Payer: Self-pay | Admitting: Internal Medicine

## 2021-10-15 DIAGNOSIS — I10 Essential (primary) hypertension: Secondary | ICD-10-CM

## 2021-10-15 DIAGNOSIS — R918 Other nonspecific abnormal finding of lung field: Secondary | ICD-10-CM

## 2021-10-25 ENCOUNTER — Telehealth: Payer: Self-pay

## 2021-10-25 ENCOUNTER — Other Ambulatory Visit: Payer: Self-pay

## 2021-10-25 MED ORDER — DOXYCYCLINE HYCLATE 20 MG PO TABS: 20.0000 mg | ORAL_TABLET | Freq: Two times a day (BID) | ORAL | 2 refills | Status: AC

## 2021-10-25 MED ORDER — METRONIDAZOLE 0.75 % EX CREA: TOPICAL_CREAM | Freq: Every day | CUTANEOUS | 6 refills | Status: DC

## 2021-10-25 NOTE — Telephone Encounter (Signed)
Patient came in the office this afternoon with medications. Doxycycline '40mg'$  and Ivermectin are no longer covered by insurance and will be very expensive for her out of pocket.  ?Please advise.  ?

## 2021-10-25 NOTE — Telephone Encounter (Signed)
Patient advised of information per Dr. Nicole Kindred and RX sent in.  ?

## 2021-10-25 NOTE — Progress Notes (Signed)
Patient requesting RFs of this medication. aw ?

## 2021-10-26 ENCOUNTER — Ambulatory Visit
Admission: RE | Admit: 2021-10-26 | Discharge: 2021-10-26 | Disposition: A | Discharge status: Home / Self Care | Payer: PPO | Source: Ambulatory Visit | Attending: Internal Medicine | Admitting: Internal Medicine

## 2021-10-26 ENCOUNTER — Other Ambulatory Visit: Payer: Self-pay

## 2021-10-26 DIAGNOSIS — I700 Atherosclerosis of aorta: Secondary | ICD-10-CM | POA: Diagnosis not present

## 2021-10-26 DIAGNOSIS — I10 Essential (primary) hypertension: Secondary | ICD-10-CM

## 2021-10-26 DIAGNOSIS — R911 Solitary pulmonary nodule: Secondary | ICD-10-CM | POA: Diagnosis not present

## 2021-10-26 DIAGNOSIS — R918 Other nonspecific abnormal finding of lung field: Secondary | ICD-10-CM | POA: Diagnosis not present

## 2021-10-31 DIAGNOSIS — I700 Atherosclerosis of aorta: Secondary | ICD-10-CM | POA: Insufficient documentation

## 2021-11-27 ENCOUNTER — Encounter: Payer: Self-pay | Admitting: Dermatology

## 2021-11-27 ENCOUNTER — Ambulatory Visit: Payer: PPO | Admitting: Dermatology

## 2021-11-27 DIAGNOSIS — L719 Rosacea, unspecified: Secondary | ICD-10-CM | POA: Diagnosis not present

## 2021-11-27 DIAGNOSIS — D2239 Melanocytic nevi of other parts of face: Secondary | ICD-10-CM

## 2021-11-27 DIAGNOSIS — D229 Melanocytic nevi, unspecified: Secondary | ICD-10-CM

## 2021-11-27 NOTE — Progress Notes (Signed)
? ?  Follow-Up Visit ?  ?Subjective  ?Laura Hale is a 76 y.o. female who presents for the following: Rosacea (Face. 2 month recheck. Took Doxycycline '20mg'$  twice daily for a short time, caused dizziness, so she D/C. Has been using Rosacea Triple Cream from [facility] and states rosacea has drastically improved). ? ? ? ?The following portions of the chart were reviewed this encounter and updated as appropriate:   ?  ? ?Review of Systems: No other skin or systemic complaints except as noted in HPI or Assessment and Plan. ? ? ?Objective  ?Well appearing patient in no apparent distress; mood and affect are within normal limits. ? ?A focused examination was performed including face. Relevant physical exam findings are noted in the Assessment and Plan. ? ?face ?Very mild macular erythema with telangiectasias  ? ?Right inferior nasal alar rim ?1.5 mm firm flesh papule ? ? ?Assessment & Plan  ?Rosacea ?face ? ?Chronic condition with duration or expected duration over one year. Currently well-controlled.  ? ?Rosacea is a chronic progressive skin condition usually affecting the face of adults, causing redness and/or acne bumps. It is treatable but not curable. It sometimes affects the eyes (ocular rosacea) as well. It may respond to topical and/or systemic medication and can flare with stress, sun exposure, alcohol, exercise and some foods.  Daily application of broad spectrum spf 30+ sunscreen to face is recommended to reduce flares. ? ?Continue [facility] metronidazole/ivermectin/azelaic acid twice daily as needed to affected areas on the face.  ? ?Okay to use Pimecrolimus cream qd/bid as needed for itching (pt also has h/o Seb Derm).  ?Resume Doxycycline 20 mg 2 PO qd as needed for flares. ? ?Related Medications ?doxycycline (ORACEA) 40 MG capsule ?Take 1 capsule (40 mg total) by mouth daily. ? ?Ivermectin (SOOLANTRA) 1 % CREA ?Apply QHS to face ? ?metroNIDAZOLE (METROCREAM) 0.75 % cream ?Apply topically  at bedtime. Qhs to face ? ?Fibrous papule of skin ?Right inferior nasal alar rim ? ?Benign-appearing.  Observation.  Call clinic for new or changing lesions.  Recommend daily use of broad spectrum spf 30+ sunscreen to sun-exposed areas.   ? ? ?Return for TBSE As Scheduled. ? ?I, Emelia Salisbury, CMA, am acting as scribe for Brendolyn Patty, MD. ? ?Documentation: I have reviewed the above documentation for accuracy and completeness, and I agree with the above. ? ?Brendolyn Patty MD  ?

## 2021-11-27 NOTE — Patient Instructions (Signed)
Continue [facility] metronidazole/ivermectin/azelaic acid twice daily as needed to affected areas on the face.  ? ?Recommend daily broad spectrum sunscreen SPF 30+ to sun-exposed areas, reapply every 2 hours as needed. Call for new or changing lesions.  ?Staying in the shade or wearing long sleeves, sun glasses (UVA+UVB protection) and wide brim hats (4-inch brim around the entire circumference of the hat) are also recommended for sun protection.  ? ?If You Need Anything After Your Visit ? ?If you have any questions or concerns for your doctor, please call our main line at [phone] and press option 4 to reach your doctor's medical assistant. If no one answers, please leave a voicemail as directed and we will return your call as soon as possible. Messages left after 4 pm will be answered the following business day.  ? ?You may also send [place] a message via MyChart. We typically respond to MyChart messages within 1-2 business days. ? ?For prescription refills, please ask your pharmacy to contact our office. Our fax number is [phone]. ? ?If you have an urgent issue when the clinic is closed that cannot wait until the next business day, you can page your doctor at the number below.   ? ?Please note that while we do our best to be available for urgent issues outside of office hours, we are not available 24/7.  ? ?If you have an urgent issue and are unable to reach [place], you may choose to seek medical care at your doctor's office, retail clinic, urgent care center, or emergency room. ? ?If you have a medical emergency, please immediately call 911 or go to the emergency department. ? ?Pager Numbers ? ?- Dr. Nehemiah Massed: [phone] ? ?- Dr. Laurence Ferrari: [phone] ? ?- Dr. Nicole Kindred: [phone] ? ?In the event of inclement weather, please call our main line at [phone] for an update on the status of any delays or closures. ? ?Dermatology Medication Tips: ?Please keep the boxes that topical medications come in in  order to help keep track of the instructions about where and how to use these. Pharmacies typically print the medication instructions only on the boxes and not directly on the medication tubes.  ? ?If your medication is too expensive, please contact our office at [phone] option 4 or send [place] a message through [place].  ? ?We are unable to tell what your co-pay for medications will be in advance as this is different depending on your insurance coverage. However, we may be able to find a substitute medication at lower cost or fill out paperwork to get insurance to cover a needed medication.  ? ?If a prior authorization is required to get your medication covered by your insurance company, please allow [place] 1-2 business days to complete this process. ? ?Drug prices often vary depending on where the prescription is filled and some pharmacies may offer cheaper prices. ? ?The website www.goodrx.com contains coupons for medications through different pharmacies. The prices here do not account for what the cost may be with help from insurance (it may be cheaper with your insurance), but the website can give you the price if you did not use any insurance.  ?- You can print the associated coupon and take it with your prescription to the pharmacy.  ?- You may also stop by our office during regular business hours and pick up a GoodRx coupon card.  ?- If you need your prescription sent electronically to a different pharmacy, notify our office through [facility] or by  phone at [phone] option 4. ? ? ? ? ?Si Usted Necesita Algo Despu?s de Su Visita ? ?Tambi?n puede enviarnos un mensaje a trav?s de MyChart. Por lo general respondemos a los mensajes de MyChart en el transcurso de 1 a 2 d?as h?biles. ? ?Para renovar recetas, por favor pida a su farmacia que se ponga en contacto con nuestra oficina. Nuestro n?mero de fax es el [phone]. ? ?Si tiene un asunto urgente cuando la cl?nica est? cerrada y que no puede  esperar hasta el siguiente d?a h?bil, puede llamar/localizar a su doctor(a) al n?mero que aparece a continuaci?n.  ? ?Por favor, tenga en cuenta que aunque hacemos todo lo posible para estar disponibles para asuntos urgentes fuera del horario de oficina, no estamos disponibles las 24 horas del d?a, los 7 d?as de la semana.  ? ?Si tiene un problema urgente y no puede comunicarse con nosotros, puede optar por buscar atenci?n m?dica  en el consultorio de su doctor(a), en una cl?nica privada, en un centro de atenci?n urgente o en una sala de emergencias. ? ?Si tiene Engineer, maintenance (IT) m?dica, por favor llame inmediatamente al 911 o vaya a la sala de emergencias. ? ?N?meros de b?per ? ?- Dr. Nehemiah Massed: [phone] ? ?- Dra. Moye: [phone] ? ?- Dra. Nicole Kindred: [phone] ? ?En caso de inclemencias del tiempo, por favor llame a nuestra l?nea principal al [phone] para una actualizaci?n sobre el estado de cualquier retraso o cierre. ? ?Consejos para la medicaci?n en dermatolog?a: ?Por favor, guarde las cajas en las que vienen los medicamentos de uso t?pico para ayudarle a seguir las instrucciones sobre d?nde y c?mo usarlos. Las farmacias generalmente imprimen las instrucciones del medicamento s?lo en las cajas y no directamente en los tubos del [place].  ? ?Si su medicamento es muy caro, por favor, p?ngase en contacto con Zigmund Daniel llamando al [phone] y presione la opci?n 4 o env?enos un mensaje a trav?s de MyChart.  ? ?No podemos decirle cu?l ser? su copago por los medicamentos por adelantado ya que esto es diferente dependiendo de la cobertura de su seguro. Sin embargo, es posible que podamos encontrar un medicamento sustituto a Electrical engineer un formulario para que el seguro cubra el medicamento que se considera necesario.  ? ?Si se requiere Ardelia Mems autorizaci?n previa para que su compa??a de seguros [place] su medicamento, por favor perm?tanos de 1 a 2 d?as h?biles para completar este proceso. ? ?Los  precios de los medicamentos var?an con frecuencia dependiendo del Environmental consultant de d?nde se surte la receta y alguna farmacias pueden ofrecer precios m?s baratos. ? ?El sitio web www.goodrx.com tiene cupones para medicamentos de Airline pilot. Los precios aqu? no tienen en cuenta lo que podr?a costar con la ayuda del seguro (puede ser m?s barato con su seguro), pero el sitio web puede darle el precio si no utiliz? ning?n seguro.  ?- Puede imprimir el cup?n correspondiente y llevarlo con su receta a la farmacia.  ?- Tambi?n puede pasar por nuestra oficina durante el horario de atenci?n regular y recoger una tarjeta de cupones de GoodRx.  ?- Si necesita que su receta se env?e electr?nicamente a Chiropodist, informe a nuestra oficina a trav?s de MyChart de [place] o por tel?fono llamando al [phone] y presione la opci?n 4.  ?

## 2021-12-07 DIAGNOSIS — I700 Atherosclerosis of aorta: Secondary | ICD-10-CM | POA: Diagnosis not present

## 2021-12-07 DIAGNOSIS — R42 Dizziness and giddiness: Secondary | ICD-10-CM | POA: Diagnosis not present

## 2021-12-07 DIAGNOSIS — J449 Chronic obstructive pulmonary disease, unspecified: Secondary | ICD-10-CM | POA: Diagnosis not present

## 2021-12-07 DIAGNOSIS — Z6841 Body Mass Index (BMI) 40.0 and over, adult: Secondary | ICD-10-CM | POA: Diagnosis not present

## 2021-12-07 DIAGNOSIS — R21 Rash and other nonspecific skin eruption: Secondary | ICD-10-CM | POA: Diagnosis not present

## 2021-12-07 DIAGNOSIS — I10 Essential (primary) hypertension: Secondary | ICD-10-CM | POA: Diagnosis not present

## 2021-12-07 DIAGNOSIS — R739 Hyperglycemia, unspecified: Secondary | ICD-10-CM | POA: Diagnosis not present

## 2022-01-14 DIAGNOSIS — R42 Dizziness and giddiness: Secondary | ICD-10-CM | POA: Diagnosis not present

## 2022-01-14 DIAGNOSIS — K140 Glossitis: Secondary | ICD-10-CM | POA: Diagnosis not present

## 2022-01-24 DIAGNOSIS — E782 Mixed hyperlipidemia: Secondary | ICD-10-CM | POA: Diagnosis not present

## 2022-01-24 DIAGNOSIS — I10 Essential (primary) hypertension: Secondary | ICD-10-CM | POA: Diagnosis not present

## 2022-01-24 DIAGNOSIS — R42 Dizziness and giddiness: Secondary | ICD-10-CM | POA: Diagnosis not present

## 2022-01-24 DIAGNOSIS — E119 Type 2 diabetes mellitus without complications: Secondary | ICD-10-CM | POA: Diagnosis not present

## 2022-01-29 DIAGNOSIS — E7849 Other hyperlipidemia: Secondary | ICD-10-CM | POA: Diagnosis not present

## 2022-01-29 DIAGNOSIS — I10 Essential (primary) hypertension: Secondary | ICD-10-CM | POA: Diagnosis not present

## 2022-01-29 DIAGNOSIS — J449 Chronic obstructive pulmonary disease, unspecified: Secondary | ICD-10-CM | POA: Diagnosis not present

## 2022-01-29 DIAGNOSIS — G4733 Obstructive sleep apnea (adult) (pediatric): Secondary | ICD-10-CM | POA: Diagnosis not present

## 2022-01-29 DIAGNOSIS — E559 Vitamin D deficiency, unspecified: Secondary | ICD-10-CM | POA: Diagnosis not present

## 2022-01-29 DIAGNOSIS — R739 Hyperglycemia, unspecified: Secondary | ICD-10-CM | POA: Diagnosis not present

## 2022-02-05 DIAGNOSIS — E559 Vitamin D deficiency, unspecified: Secondary | ICD-10-CM | POA: Diagnosis not present

## 2022-02-05 DIAGNOSIS — M5020 Other cervical disc displacement, unspecified cervical region: Secondary | ICD-10-CM | POA: Diagnosis not present

## 2022-02-05 DIAGNOSIS — G4733 Obstructive sleep apnea (adult) (pediatric): Secondary | ICD-10-CM | POA: Diagnosis not present

## 2022-02-05 DIAGNOSIS — I10 Essential (primary) hypertension: Secondary | ICD-10-CM | POA: Diagnosis not present

## 2022-02-05 DIAGNOSIS — E7849 Other hyperlipidemia: Secondary | ICD-10-CM | POA: Diagnosis not present

## 2022-02-05 DIAGNOSIS — E119 Type 2 diabetes mellitus without complications: Secondary | ICD-10-CM | POA: Diagnosis not present

## 2022-02-05 DIAGNOSIS — G629 Polyneuropathy, unspecified: Secondary | ICD-10-CM | POA: Diagnosis not present

## 2022-02-05 DIAGNOSIS — I700 Atherosclerosis of aorta: Secondary | ICD-10-CM | POA: Diagnosis not present

## 2022-02-05 DIAGNOSIS — J449 Chronic obstructive pulmonary disease, unspecified: Secondary | ICD-10-CM | POA: Diagnosis not present

## 2022-02-05 DIAGNOSIS — Z0000 Encounter for general adult medical examination without abnormal findings: Secondary | ICD-10-CM | POA: Diagnosis not present

## 2022-02-05 DIAGNOSIS — E1159 Type 2 diabetes mellitus with other circulatory complications: Secondary | ICD-10-CM | POA: Insufficient documentation

## 2022-05-20 ENCOUNTER — Ambulatory Visit: Payer: PPO | Admitting: Dermatology

## 2022-05-20 DIAGNOSIS — L814 Other melanin hyperpigmentation: Secondary | ICD-10-CM

## 2022-05-20 DIAGNOSIS — L739 Follicular disorder, unspecified: Secondary | ICD-10-CM | POA: Diagnosis not present

## 2022-05-20 DIAGNOSIS — Z85828 Personal history of other malignant neoplasm of skin: Secondary | ICD-10-CM | POA: Diagnosis not present

## 2022-05-20 DIAGNOSIS — L719 Rosacea, unspecified: Secondary | ICD-10-CM | POA: Diagnosis not present

## 2022-05-20 DIAGNOSIS — L820 Inflamed seborrheic keratosis: Secondary | ICD-10-CM

## 2022-05-20 DIAGNOSIS — D229 Melanocytic nevi, unspecified: Secondary | ICD-10-CM | POA: Diagnosis not present

## 2022-05-20 DIAGNOSIS — L821 Other seborrheic keratosis: Secondary | ICD-10-CM

## 2022-05-20 DIAGNOSIS — Z1283 Encounter for screening for malignant neoplasm of skin: Secondary | ICD-10-CM | POA: Diagnosis not present

## 2022-05-20 DIAGNOSIS — L578 Other skin changes due to chronic exposure to nonionizing radiation: Secondary | ICD-10-CM | POA: Diagnosis not present

## 2022-05-20 MED ORDER — MUPIROCIN 2 % EX OINT: TOPICAL_OINTMENT | CUTANEOUS | 1 refills | Status: DC

## 2022-05-20 NOTE — Patient Instructions (Addendum)
Seborrheic Keratosis  What causes seborrheic keratoses? Seborrheic keratoses are harmless, common skin growths that first appear during adult life.  As time goes by, more growths appear.  Some people may develop a large number of them.  Seborrheic keratoses appear on both covered and uncovered body parts.  They are not caused by sunlight.  The tendency to develop seborrheic keratoses can be inherited.  They vary in color from skin-colored to gray, brown, or even black.  They can be either smooth or have a rough, warty surface.   Seborrheic keratoses are superficial and look as if they were stuck on the skin.  Under the microscope this type of keratosis looks like layers upon layers of skin.  That is why at times the top layer may seem to fall off, but the rest of the growth remains and re-grows.    Treatment Seborrheic keratoses do not need to be treated, but can easily be removed in the office.  Seborrheic keratoses often cause symptoms when they rub on clothing or jewelry.  Lesions can be in the way of shaving.  If they become inflamed, they can cause itching, soreness, or burning.  Removal of a seborrheic keratosis can be accomplished by freezing, burning, or surgery. If any spot bleeds, scabs, or grows rapidly, please return to have it checked, as these can be an indication of a skin cancer.  Cryotherapy Aftercare  Wash gently with soap and water everyday.   Apply Vaseline and Band-Aid daily until healed.    Due to recent changes in healthcare laws, you may see results of your pathology and/or laboratory studies on MyChart before the doctors have had a chance to review them. We understand that in some cases there may be results that are confusing or concerning to you. Please understand that not all results are received at the same time and often the doctors may need to interpret multiple results in order to provide you with the best plan of care or course of treatment. Therefore, we ask that you  please give us 2 business days to thoroughly review all your results before contacting the office for clarification. Should we see a critical lab result, you will be contacted sooner.   If You Need Anything After Your Visit  If you have any questions or concerns for your doctor, please call our main line at [phone] and press option 4 to reach your doctor's medical assistant. If no one answers, please leave a voicemail as directed and we will return your call as soon as possible. Messages left after 4 pm will be answered the following business day.   You may also send us a message via MyChart. We typically respond to MyChart messages within 1-2 business days.  For prescription refills, please ask your pharmacy to contact our office. Our fax number is [phone].  If you have an urgent issue when the clinic is closed that cannot wait until the next business day, you can page your doctor at the number below.    Please note that while we do our best to be available for urgent issues outside of office hours, we are not available 24/7.   If you have an urgent issue and are unable to reach us, you may choose to seek medical care at your doctor's office, retail clinic, urgent care center, or emergency room.  If you have a medical emergency, please immediately call 911 or go to the emergency department.  Pager Numbers  - Dr. Kowalski: [phone]  -   Dr. Moye: [phone]  - Dr. Stewart: [phone]  In the event of inclement weather, please call our main line at [phone] for an update on the status of any delays or closures.  Dermatology Medication Tips: Please keep the boxes that topical medications come in in order to help keep track of the instructions about where and how to use these. Pharmacies typically print the medication instructions only on the boxes and not directly on the medication tubes.   If your medication is too expensive, please contact our office at  [phone] option 4 or send us a message through MyChart.   We are unable to tell what your co-pay for medications will be in advance as this is different depending on your insurance coverage. However, we may be able to find a substitute medication at lower cost or fill out paperwork to get insurance to cover a needed medication.   If a prior authorization is required to get your medication covered by your insurance company, please allow us 1-2 business days to complete this process.  Drug prices often vary depending on where the prescription is filled and some pharmacies may offer cheaper prices.  The website www.goodrx.com contains coupons for medications through different pharmacies. The prices here do not account for what the cost may be with help from insurance (it may be cheaper with your insurance), but the website can give you the price if you did not use any insurance.  - You can print the associated coupon and take it with your prescription to the pharmacy.  - You may also stop by our office during regular business hours and pick up a GoodRx coupon card.  - If you need your prescription sent electronically to a different pharmacy, notify our office through [place] MyChart or by phone at [phone] option 4.     Si Usted Necesita Algo Despus de Su Visita  Tambin puede enviarnos un mensaje a travs de MyChart. Por lo general respondemos a los mensajes de MyChart en el transcurso de 1 a 2 das hbiles.  Para renovar recetas, por favor pida a su farmacia que se ponga en contacto con nuestra oficina. Nuestro nmero de fax es el [phone].  Si tiene un asunto urgente cuando la clnica est cerrada y que no puede esperar hasta el siguiente da hbil, puede llamar/localizar a su doctor(a) al nmero que aparece a continuacin.   Por favor, tenga en cuenta que aunque hacemos todo lo posible para estar disponibles para asuntos urgentes fuera del horario de oficina, no estamos  disponibles las 24 horas del da, los 7 das de la semana.   Si tiene un problema urgente y no puede comunicarse con nosotros, puede optar por buscar atencin mdica  en el consultorio de su doctor(a), en una clnica privada, en un centro de atencin urgente o en una sala de emergencias.  Si tiene una emergencia mdica, por favor llame inmediatamente al 911 o vaya a la sala de emergencias.  Nmeros de bper  - Dr. Kowalski: [phone]  - Dra. Moye: [phone]  - Dra. Stewart: [phone]  En caso de inclemencias del tiempo, por favor llame a nuestra lnea principal al [phone] para una actualizacin sobre el estado de cualquier retraso o cierre.  Consejos para la medicacin en dermatologa: Por favor, guarde las cajas en las que vienen los medicamentos de uso tpico para ayudarle a seguir las instrucciones sobre dnde y cmo usarlos. Las farmacias generalmente imprimen las instrucciones del medicamento slo en las cajas y   no directamente en los tubos del medicamento.   Si su medicamento es muy caro, por favor, pngase en contacto con nuestra oficina llamando al [phone] y presione la opcin 4 o envenos un mensaje a travs de MyChart.   No podemos decirle cul ser su copago por los medicamentos por adelantado ya que esto es diferente dependiendo de la cobertura de su seguro. Sin embargo, es posible que podamos encontrar un medicamento sustituto a menor costo o llenar un formulario para que el seguro cubra el medicamento que se considera necesario.   Si se requiere una autorizacin previa para que su compaa de seguros cubra su medicamento, por favor permtanos de 1 a 2 das hbiles para completar este proceso.  Los precios de los medicamentos varan con frecuencia dependiendo del lugar de dnde se surte la receta y alguna farmacias pueden ofrecer precios ms baratos.  El sitio web www.goodrx.com tiene cupones para medicamentos de diferentes farmacias. Los precios aqu no  tienen en cuenta lo que podra costar con la ayuda del seguro (puede ser ms barato con su seguro), pero el sitio web puede darle el precio si no utiliz ningn seguro.  - Puede imprimir el cupn correspondiente y llevarlo con su receta a la farmacia.  - Tambin puede pasar por nuestra oficina durante el horario de atencin regular y recoger una tarjeta de cupones de GoodRx.  - Si necesita que su receta se enve electrnicamente a una farmacia diferente, informe a nuestra oficina a travs de MyChart de [place] o por telfono llamando al [phone] y presione la opcin 4.  

## 2022-05-20 NOTE — Progress Notes (Signed)
Follow-Up Visit   Subjective  Laura Hale is a 76 y.o. female who presents for the following: Annual Exam. History of BCC on the nasal tip in 2006. Recheck rosacea on her face treating with [facility] Rosacea triple cream with a good response.  The patient presents for Total-Body Skin Exam (TBSE) for skin cancer screening and mole check.  The patient has spots, moles and lesions to be evaluated, some may be new or changing and the patient has concerns that these could be cancer.  She has some new itchy growths on her forehead, as well as an itchy spot on her wrist.   The following portions of the chart were reviewed this encounter and updated as appropriate:      Review of Systems:  No other skin or systemic complaints except as noted in HPI or Assessment and Plan.  Objective  Well appearing patient in no apparent distress; mood and affect are within normal limits.  A full examination was performed including scalp, head, eyes, ears, nose, lips, neck, chest, axillae, abdomen, back, buttocks, bilateral upper extremities, bilateral lower extremities, hands, feet, fingers, toes, fingernails, and toenails. All findings within normal limits unless otherwise noted below.  face Mild erythema on malar cheeks, left>right   right medial nostril Pink papule- present x 2 weeks  right nasal ala rim 1.5 mm firm flesh papule   right wrist x 1, left forehead x 3, right upper eyebrow x 2  (6) (6) Stuck-on, waxy, tan-brown papules -- Discussed benign etiology and prognosis.    Assessment & Plan  Rosacea face  Chronic condition with duration or expected duration over one year. Currently well-controlled.   Rosacea is a chronic progressive skin condition usually affecting the face of adults, causing redness and/or acne bumps. It is treatable but not curable. It sometimes affects the eyes (ocular rosacea) as well. It may respond to topical and/or systemic medication and can flare with  stress, sun exposure, alcohol, exercise, topical steroids (including hydrocortisone/cortisone 10) and some foods.  Daily application of broad spectrum spf 30+ sunscreen to face is recommended to reduce flares.   Continue [facility] rosacea triple cream qhs as needed  Folliculitis right medial nostril  Start Mupirocin ointment apply to affected skin qd-bid x 7 days, RTC if doesn't resolve by 4 weeks  Related Medications mupirocin ointment (BACTROBAN) 2 % Apply to skin qd-bid  Fibrous papule of skin right nasal ala rim  Benign-appearing.  Observation.  Call clinic for new or changing moles.  Recommend daily use of broad spectrum spf 30+ sunscreen to sun-exposed areas.    Inflamed seborrheic keratosis (6) right wrist x 1, left forehead x 3, right upper eyebrow x 2  (6)  Symptomatic, irritating, patient would like treated.   Destruction of lesion - right wrist x 1, left forehead x 3, right upper eyebrow x 2  (6)  Destruction method: cryotherapy   Informed consent: discussed and consent obtained   Timeout:  patient name, date of birth, surgical site, and procedure verified Lesion destroyed using liquid nitrogen: Yes   Region frozen until ice ball extended beyond lesion: Yes   Outcome: patient tolerated procedure well with no complications   Post-procedure details: wound care instructions given   Additional details:  Prior to procedure, discussed risks of blister formation, small wound, skin dyspigmentation, or rare scar following cryotherapy. Recommend Vaseline ointment to treated areas while healing.    Lentigines Back - Scattered tan macules - Due to sun exposure -  Benign-appearing, observe - Recommend daily broad spectrum sunscreen SPF 30+ to sun-exposed areas, reapply every 2 hours as needed. - Call for any changes  Seborrheic Keratoses Forehead  - Stuck-on, waxy, tan-brown papules and/or plaques  - Benign-appearing - Discussed benign etiology and prognosis. -  Observe - Call for any changes  Melanocytic Nevi back - Tan-brown and/or pink-flesh-colored symmetric macules and papules - Benign appearing on exam today - Observation - Call clinic for new or changing moles - Recommend daily use of broad spectrum spf 30+ sunscreen to sun-exposed areas.   Hemangiomas Back  - Red papules - Discussed benign nature - Observe - Call for any changes   Actinic Damage chest - chronic, secondary to cumulative UV radiation exposure/sun exposure over time - diffuse scaly erythematous macules with underlying dyspigmentation - Recommend daily broad spectrum sunscreen SPF 30+ to sun-exposed areas, reapply every 2 hours as needed.  - Recommend staying in the shade or wearing long sleeves, sun glasses (UVA+UVB protection) and wide brim hats (4-inch brim around the entire circumference of the hat). - Call for new or changing lesions.    History of Basal Cell Carcinoma of the Skin Nasal tip 2006 - No evidence of recurrence today - Recommend regular full body skin exams - Recommend daily broad spectrum sunscreen SPF 30+ to sun-exposed areas, reapply every 2 hours as needed.  - Call if any new or changing lesions are noted between office visits   Skin cancer screening performed today.   Return in about 1 year (around 05/21/2023) for TBSE, hx of BCC, Rosacea.  I, Marye Round, CMA, am acting as scribe for Brendolyn Patty, MD .  Documentation: I have reviewed the above documentation for accuracy and completeness, and I agree with the above.  Brendolyn Patty MD

## 2022-05-23 ENCOUNTER — Other Ambulatory Visit: Payer: Self-pay | Admitting: Internal Medicine

## 2022-05-23 DIAGNOSIS — Z1231 Encounter for screening mammogram for malignant neoplasm of breast: Secondary | ICD-10-CM

## 2022-05-27 DIAGNOSIS — B009 Herpesviral infection, unspecified: Secondary | ICD-10-CM | POA: Diagnosis not present

## 2022-06-20 DIAGNOSIS — Z96642 Presence of left artificial hip joint: Secondary | ICD-10-CM | POA: Diagnosis not present

## 2022-06-20 DIAGNOSIS — M19111 Post-traumatic osteoarthritis, right shoulder: Secondary | ICD-10-CM | POA: Diagnosis not present

## 2022-06-27 ENCOUNTER — Other Ambulatory Visit: Payer: Self-pay

## 2022-06-27 ENCOUNTER — Emergency Department
Admission: EM | Admit: 2022-06-27 | Discharge: 2022-06-27 | Disposition: A | Discharge status: Home / Self Care | Payer: PPO | Attending: Emergency Medicine | Admitting: Emergency Medicine

## 2022-06-27 ENCOUNTER — Emergency Department: Payer: PPO

## 2022-06-27 ENCOUNTER — Encounter: Payer: Self-pay | Admitting: Emergency Medicine

## 2022-06-27 DIAGNOSIS — Z23 Encounter for immunization: Secondary | ICD-10-CM | POA: Insufficient documentation

## 2022-06-27 DIAGNOSIS — S61214A Laceration without foreign body of right ring finger without damage to nail, initial encounter: Secondary | ICD-10-CM | POA: Diagnosis not present

## 2022-06-27 DIAGNOSIS — S6992XA Unspecified injury of left wrist, hand and finger(s), initial encounter: Secondary | ICD-10-CM | POA: Diagnosis present

## 2022-06-27 DIAGNOSIS — S62617B Displaced fracture of proximal phalanx of left little finger, initial encounter for open fracture: Secondary | ICD-10-CM | POA: Diagnosis not present

## 2022-06-27 DIAGNOSIS — W010XXA Fall on same level from slipping, tripping and stumbling without subsequent striking against object, initial encounter: Secondary | ICD-10-CM | POA: Diagnosis not present

## 2022-06-27 DIAGNOSIS — Z79899 Other long term (current) drug therapy: Secondary | ICD-10-CM | POA: Insufficient documentation

## 2022-06-27 DIAGNOSIS — S62617A Displaced fracture of proximal phalanx of left little finger, initial encounter for closed fracture: Secondary | ICD-10-CM | POA: Diagnosis not present

## 2022-06-27 DIAGNOSIS — S61217A Laceration without foreign body of left little finger without damage to nail, initial encounter: Secondary | ICD-10-CM | POA: Diagnosis not present

## 2022-06-27 MED ORDER — HYDROCODONE-ACETAMINOPHEN 5-325 MG PO TABS: 1.0000 | ORAL_TABLET | Freq: Four times a day (QID) | ORAL | 0 refills | Status: DC | PRN

## 2022-06-27 MED ORDER — LIDOCAINE HCL (PF) 1 % IJ SOLN
5.0000 mL | Freq: Once | INTRAMUSCULAR | Status: AC
  Administered 2022-06-27: 5 mL
  Filled 2022-06-27: qty 5

## 2022-06-27 MED ORDER — TETANUS-DIPHTH-ACELL PERTUSSIS 5-2.5-18.5 LF-MCG/0.5 IM SUSY
0.5000 mL | PREFILLED_SYRINGE | Freq: Once | INTRAMUSCULAR | Status: AC
  Administered 2022-06-27: 0.5 mL via INTRAMUSCULAR
  Filled 2022-06-27: qty 0.5

## 2022-06-27 MED ORDER — CEFAZOLIN SODIUM-DEXTROSE 2-4 GM/100ML-% IV SOLN
2.0000 g | INTRAVENOUS | Status: AC
  Administered 2022-06-27: 2 g via INTRAVENOUS
  Filled 2022-06-27: qty 100

## 2022-06-27 MED ORDER — HYDROCODONE-ACETAMINOPHEN 5-325 MG PO TABS
1.0000 | ORAL_TABLET | ORAL | Status: AC
  Administered 2022-06-27: 1 via ORAL
  Filled 2022-06-27: qty 1

## 2022-06-27 MED ORDER — CEPHALEXIN 500 MG PO CAPS: 500.0000 mg | ORAL_CAPSULE | Freq: Four times a day (QID) | ORAL | 0 refills | Status: AC

## 2022-06-27 NOTE — ED Provider Notes (Signed)
[facility] EMERGENCY DEPARTMENT Provider Note   CSN: [id number] Arrival date & time: 06/27/22  1502     History  Chief Complaint  Patient presents with   Laura Hale is a 76 y.o. female.  Presents to the emergency department for evaluation of a fall.  Patient said a mechanical fall, tripped and her left hand was caught by the wrought iron railing, railing went in between the fourth and fifth digit of her left hand.  She states she did bump her head but denies having any headache, LOC, nausea or vomiting.  She denies any neck pain numbness tingling radicular symptoms.  No vision changes.  She is not on blood thinners.  She has been ambulatory and denies any dizziness or lightheadedness.  She has bleeding in between the fourth and fifth digit of her left hand with a deformity to the fifth digit  HPI     Home Medications Prior to Admission medications   Medication Sig Start Date End Date Taking? Authorizing Provider  cephALEXin (KEFLEX) 500 MG capsule Take 1 capsule (500 mg total) by mouth 4 (four) times daily for 7 days. 06/27/22 07/04/22 Yes Duanne Guess, PA-C  HYDROcodone-acetaminophen (NORCO) 5-325 MG tablet Take 1 tablet by mouth every 6 (six) hours as needed for moderate pain. 06/27/22  Yes Duanne Guess, PA-C  acetaminophen (TYLENOL) 500 MG tablet Take 1,000 mg by mouth every 6 (six) hours as needed for moderate pain.     [provider]  atorvastatin (LIPITOR) 80 MG tablet Take 80 mg by mouth daily.  01/03/15   [provider]  celecoxib (CELEBREX) 200 MG capsule Take 1 capsule (200 mg total) by mouth 2 (two) times daily. 06/23/20   Fausto Skillern, PA-C  cetirizine (ZYRTEC) 10 MG tablet Take 10 mg by mouth daily.    [provider]  diphenhydramine-acetaminophen (TYLENOL PM) 25-500 MG TABS tablet Take 1 tablet by mouth at bedtime as needed (sleep).    [provider]  enoxaparin (LOVENOX) 40  MG/0.4ML injection Inject 0.4 mLs (40 mg total) into the skin daily for 14 days. 06/23/20 07/07/20  Fausto Skillern, PA-C  Fluocinolone Acetonide 0.01 % OIL Place 1 application in ear(s) as directed. Apply 1-2 gtts to itchy ears qd to bid prn flares 05/14/21   Brendolyn Patty, MD  hydrochlorothiazide (HYDRODIURIL) 25 MG tablet Take 25 mg by mouth daily. 03/30/20   [provider]  Multiple Vitamin (MULTIVITAMIN) tablet Take 1 tablet by mouth daily.    [provider]  mupirocin ointment (BACTROBAN) 2 % Apply to skin qd-bid 05/20/22   Brendolyn Patty, MD  oxyCODONE (OXY IR/ROXICODONE) 5 MG immediate release tablet Take 1 tablet (5 mg total) by mouth every 4 (four) hours as needed for moderate pain (pain score 4-6). 06/23/20   Fausto Skillern, PA-C  pimecrolimus (ELIDEL) 1 % cream Apply BID to itchy scaly areas on face 09/19/21   Brendolyn Patty, MD  traMADol (ULTRAM) 50 MG tablet Take 1 tablet (50 mg total) by mouth every 4 (four) hours as needed for moderate pain. 06/23/20   Fausto Skillern, PA-C      Allergies    Patient has no known allergies.    Review of Systems   Review of Systems  Physical Exam Updated Vital Signs BP (!) 158/87 (BP Location: Left Arm)   Pulse 80   Temp 98.3 F (36.8 C) (Oral)   Resp 18   Ht  5' 5.5" (1.664 m)   Wt 113 kg   LMP  (LMP Unknown)   SpO2 98%   BMI 40.83 kg/m  Physical Exam Constitutional:      Appearance: She is well-developed.  HENT:     Head: Normocephalic and atraumatic.     Right Ear: External ear normal.     Left Ear: External ear normal.     Nose: Nose normal.     Mouth/Throat:     Pharynx: Oropharynx is clear. No oropharyngeal exudate or posterior oropharyngeal erythema.  Eyes:     Conjunctiva/sclera: Conjunctivae normal.  Cardiovascular:     Rate and Rhythm: Normal rate.  Pulmonary:     Effort: Pulmonary effort is normal. No respiratory distress.  Musculoskeletal:        General: Normal range of motion.     Cervical  back: Normal range of motion.     Comments: Left fifth digit with laceration along the volar aspect of the fifth digit.  Proximal phalanx of the fifth digit is dorsally displaced 90 degrees.  Boutonniere deformity present.  She is able to flex at the MCP.  There is a volar laceration along the base of the proximal phalanx along the volar aspect that extends into the webspace between the fourth and fifth digit.  Skin:    General: Skin is warm.     Findings: No rash.  Neurological:     General: No focal deficit present.     Mental Status: She is alert and oriented to person, place, and time. Mental status is at baseline.     Motor: No weakness.     Gait: Gait normal.  Psychiatric:        Mood and Affect: Mood normal.        Behavior: Behavior normal.        Thought Content: Thought content normal.     ED Results / Procedures / Treatments   Labs (all labs ordered are listed, but only abnormal results are displayed) Labs Reviewed - No data to display  EKG None  Radiology DG Hand Complete Left  Result Date: 06/27/2022 CLINICAL DATA:  Fall. Injured left hand today. Laceration to left hand at metacarpophalangeal joint of fifth digit on palmar aspect. EXAM: LEFT HAND - COMPLETE 3+ VIEW COMPARISON:  None Available. FINDINGS: There is a complete transverse fracture of the proximal metaphysis of the proximal phalanx of the fifth finger with high-grade dorsal angulation of the distal aspect of the proximal phalanx such that it is perpendicular to the base of the proximal phalanx. No fracture extension to the proximal articular surface. The fifth metacarpophalangeal joint space is preserved. Moderate to severe second and moderate third and fourth DIP and moderate thumb interphalangeal joint space narrowing and peripheral osteophytosis. Moderate thumb carpometacarpal joint space narrowing and peripheral osteophytosis. IMPRESSION: Complete transverse fracture of the proximal metaphysis of the proximal  phalanx of the fifth finger with high-grade dorsal angulation of the distal aspect of the proximal phalanx. Electronically Signed   By: Yvonne Kendall M.D.   On: 06/27/2022 15:58    Procedures .Marland KitchenLaceration Repair  Date/Time: 06/27/2022 7:11 PM  Performed by: Duanne Guess, PA-C Authorized by: Duanne Guess, PA-C   Consent:    Consent obtained:  Verbal   Consent given by:  Patient   Risks discussed:  Need for additional repair Universal protocol:    Patient identity confirmed:  Verbally with patient Anesthesia:    Anesthesia method:  Local infiltration and nerve block  Local anesthetic:  Lidocaine 1% w/o epi   Block location:  Left little finger   Block needle gauge:  25 G   Block anesthetic:  Lidocaine 1% w/o epi   Block injection procedure:  Anatomic landmarks identified   Block outcome:  Anesthesia achieved Laceration details:    Location:  Finger   Finger location:  L small finger   Length (cm):  3 Exploration:    Wound extent: no nerve damage and no tendon damage     Contaminated: no   Treatment:    Area cleansed with:  Povidone-iodine   Amount of cleaning:  Standard   Irrigation solution:  Sterile saline   Irrigation method:  Pressure wash (And soaked in Betadine and saline)   Debridement:  None Skin repair:    Repair method:  Sutures   Suture size:  4-0   Suture material:  Nylon   Suture technique:  Simple interrupted   Number of sutures:  7 Approximation:    Approximation:  Close Repair type:    Repair type:  Simple Post-procedure details:    Dressing:  Antibiotic ointment, bulky dressing and splint for protection (Dorsal splint applied to the hand with 90 degrees of flexion at the MCP joint.  Improved alignment of the fifth digit at the MCP PIP and DIP joint.  Patient able to perform some active flexion of the left fifth digit)   Procedure completion:  Tolerated well, no immediate complications     Medications Ordered in ED Medications  Tdap  (BOOSTRIX) injection 0.5 mL (0.5 mLs Intramuscular Given 06/27/22 1703)  HYDROcodone-acetaminophen (NORCO/VICODIN) 5-325 MG per tablet 1 tablet (1 tablet Oral Given 06/27/22 1703)  lidocaine (PF) (XYLOCAINE) 1 % injection 5 mL (5 mLs Infiltration Given by Other 06/27/22 1744)  ceFAZolin (ANCEF) IVPB 2g/100 mL premix (0 g Intravenous Stopped 06/27/22 1855)    ED Course/ Medical Decision Making/ A&P                           Medical Decision Making Amount and/or Complexity of Data Reviewed Radiology: ordered.  Risk Prescription drug management.   76 year old female with injury to the left hand fifth digit.  She suffered a dorsally displaced proximal phalanx fracture with volar laceration.  No tendon deficits noted.  Digital block performed, wound soaked in Betadine and saline and then flush irrigated with Betadine and saline and laceration repaired with number seven 4-0 nylon sutures.  Patient tolerated the procedure well.  After laceration was repaired reduction was performed to the proximal phalanx applying traction.  Proximal phalanx displacement was significantly improved and patient was able to maintain normal anatomical position of the fifth digit.  The fourth and fifth digits were buddy taped and then placed into 90 degrees of flexion at the MCP joints and a dorsal splint and Ace wrap were applied.  Patient was started on oral antibiotics as well as given a dose of Ancef here in the emergency department.  Her tetanus was updated.  She is given Norco for pain.  She will call orthopedic office tomorrow to schedule follow-up appointment.  Discussed case with orthopedist on-call who is in agreement with plan performed Final Clinical Impression(s) / ED Diagnoses Final diagnoses:  Displaced fracture of proximal phalanx of left little finger, initial encounter for open fracture    Rx / DC Orders ED Discharge Orders          Ordered    cephALEXin (KEFLEX) 500 MG  capsule  4 times daily         06/27/22 1837    HYDROcodone-acetaminophen (NORCO) 5-325 MG tablet  Every 6 hours PRN        06/27/22 1838              Renata Caprice 06/27/22 1915    Nathaniel Man, MD 06/30/22 [phone]

## 2022-06-27 NOTE — Discharge Instructions (Signed)
Please take antibiotics as prescribed.  Use pain medication as needed for moderate to severe pain.  Keep splint clean and dry and on at all times.  Call orthopedic office tomorrow to schedule follow-up appointment.  Return to the ER for any worsening symptoms or any urgent changes in your health

## 2022-06-27 NOTE — ED Triage Notes (Addendum)
Pt here after a fall today. Pt hit her head, left arm, and left hand. Pt denies LOC. Pt denies blood thinners. Pt has laceration to left hand.

## 2022-06-28 ENCOUNTER — Other Ambulatory Visit: Payer: Self-pay | Admitting: Orthopedic Surgery

## 2022-06-28 DIAGNOSIS — Z96642 Presence of left artificial hip joint: Secondary | ICD-10-CM | POA: Diagnosis not present

## 2022-06-28 DIAGNOSIS — S62617B Displaced fracture of proximal phalanx of left little finger, initial encounter for open fracture: Secondary | ICD-10-CM | POA: Diagnosis not present

## 2022-07-01 ENCOUNTER — Encounter
Admission: RE | Admit: 2022-07-01 | Discharge: 2022-07-01 | Disposition: A | Discharge status: Home / Self Care | Payer: PPO | Source: Ambulatory Visit | Attending: Orthopedic Surgery | Admitting: Orthopedic Surgery

## 2022-07-01 ENCOUNTER — Encounter: Payer: Self-pay | Admitting: Orthopedic Surgery

## 2022-07-01 DIAGNOSIS — Z01812 Encounter for preprocedural laboratory examination: Secondary | ICD-10-CM | POA: Diagnosis not present

## 2022-07-01 DIAGNOSIS — Z79899 Other long term (current) drug therapy: Secondary | ICD-10-CM

## 2022-07-01 LAB — POTASSIUM: Potassium: 4.3 mmol/L (ref 3.5–5.1)

## 2022-07-02 ENCOUNTER — Other Ambulatory Visit: Payer: Self-pay

## 2022-07-02 ENCOUNTER — Ambulatory Visit
Admission: RE | Admit: 2022-07-02 | Discharge: 2022-07-02 | Disposition: A | Discharge status: Home / Self Care | Payer: PPO | Attending: Orthopedic Surgery | Admitting: Orthopedic Surgery

## 2022-07-02 ENCOUNTER — Encounter
Admission: RE | Disposition: A | Discharge status: Home / Self Care | Payer: Self-pay | Source: Home / Self Care | Attending: Orthopedic Surgery

## 2022-07-02 ENCOUNTER — Ambulatory Visit: Payer: Self-pay

## 2022-07-02 ENCOUNTER — Ambulatory Visit: Payer: PPO | Admitting: Anesthesiology

## 2022-07-02 ENCOUNTER — Encounter: Payer: Self-pay | Admitting: Orthopedic Surgery

## 2022-07-02 DIAGNOSIS — J45909 Unspecified asthma, uncomplicated: Secondary | ICD-10-CM | POA: Diagnosis not present

## 2022-07-02 DIAGNOSIS — W010XXA Fall on same level from slipping, tripping and stumbling without subsequent striking against object, initial encounter: Secondary | ICD-10-CM | POA: Diagnosis not present

## 2022-07-02 DIAGNOSIS — Z79899 Other long term (current) drug therapy: Secondary | ICD-10-CM

## 2022-07-02 DIAGNOSIS — G4730 Sleep apnea, unspecified: Secondary | ICD-10-CM | POA: Diagnosis not present

## 2022-07-02 DIAGNOSIS — S62617B Displaced fracture of proximal phalanx of left little finger, initial encounter for open fracture: Secondary | ICD-10-CM | POA: Insufficient documentation

## 2022-07-02 DIAGNOSIS — Z89019 Acquired absence of unspecified thumb: Secondary | ICD-10-CM | POA: Diagnosis not present

## 2022-07-02 DIAGNOSIS — R7303 Prediabetes: Secondary | ICD-10-CM | POA: Diagnosis not present

## 2022-07-02 DIAGNOSIS — E785 Hyperlipidemia, unspecified: Secondary | ICD-10-CM | POA: Diagnosis not present

## 2022-07-02 DIAGNOSIS — Z96642 Presence of left artificial hip joint: Secondary | ICD-10-CM | POA: Insufficient documentation

## 2022-07-02 DIAGNOSIS — Z87891 Personal history of nicotine dependence: Secondary | ICD-10-CM | POA: Insufficient documentation

## 2022-07-02 DIAGNOSIS — J449 Chronic obstructive pulmonary disease, unspecified: Secondary | ICD-10-CM | POA: Diagnosis not present

## 2022-07-02 DIAGNOSIS — Y9301 Activity, walking, marching and hiking: Secondary | ICD-10-CM | POA: Diagnosis not present

## 2022-07-02 DIAGNOSIS — Z6839 Body mass index (BMI) 39.0-39.9, adult: Secondary | ICD-10-CM | POA: Diagnosis not present

## 2022-07-02 DIAGNOSIS — Z01812 Encounter for preprocedural laboratory examination: Secondary | ICD-10-CM

## 2022-07-02 DIAGNOSIS — I10 Essential (primary) hypertension: Secondary | ICD-10-CM | POA: Diagnosis not present

## 2022-07-02 HISTORY — PX: CLOSED REDUCTION METACARPAL WITH PERCUTANEOUS PINNING: SHX5613

## 2022-07-02 HISTORY — DX: Stiffness of left hand, not elsewhere classified: M25.642

## 2022-07-02 HISTORY — DX: Sleep apnea, unspecified: G47.30

## 2022-07-02 HISTORY — DX: Prediabetes: R73.03

## 2022-07-02 SURGERY — CLOSED REDUCTION, FRACTURE, METACARPAL BONE, WITH PERCUTANEOUS PINNING
Anesthesia: General | Site: Finger | Laterality: Left

## 2022-07-02 MED ORDER — OXYCODONE HCL 5 MG/5ML PO SOLN: 5.0000 mg | Freq: Once | ORAL | Status: DC | PRN

## 2022-07-02 MED ORDER — FENTANYL CITRATE (PF) 100 MCG/2ML IJ SOLN
INTRAMUSCULAR | Status: DC | PRN
  Administered 2022-07-02: 50 ug via INTRAVENOUS

## 2022-07-02 MED ORDER — HYDROCODONE-ACETAMINOPHEN 5-325 MG PO TABS: 1.0000 | ORAL_TABLET | ORAL | Status: DC | PRN

## 2022-07-02 MED ORDER — PROPOFOL 10 MG/ML IV BOLUS
INTRAVENOUS | Status: DC | PRN
  Administered 2022-07-02: 150 mg via INTRAVENOUS

## 2022-07-02 MED ORDER — OXYCODONE HCL 5 MG PO TABS: 5.0000 mg | ORAL_TABLET | Freq: Once | ORAL | Status: DC | PRN

## 2022-07-02 MED ORDER — ONDANSETRON HCL 4 MG/2ML IJ SOLN
INTRAMUSCULAR | Status: DC | PRN
  Administered 2022-07-02: 4 mg via INTRAVENOUS

## 2022-07-02 MED ORDER — METOCLOPRAMIDE HCL 5 MG PO TABS: 5.0000 mg | ORAL_TABLET | Freq: Three times a day (TID) | ORAL | Status: DC | PRN

## 2022-07-02 MED ORDER — ACETAMINOPHEN 325 MG PO TABS: 325.0000 mg | ORAL_TABLET | Freq: Four times a day (QID) | ORAL | Status: DC | PRN

## 2022-07-02 MED ORDER — HYDRALAZINE HCL 20 MG/ML IJ SOLN: 10.0000 mg | Freq: Once | INTRAMUSCULAR | Status: DC

## 2022-07-02 MED ORDER — ONDANSETRON HCL 4 MG/2ML IJ SOLN: 4.0000 mg | Freq: Four times a day (QID) | INTRAMUSCULAR | Status: DC | PRN

## 2022-07-02 MED ORDER — HYDROCODONE-ACETAMINOPHEN 7.5-325 MG PO TABS: 1.0000 | ORAL_TABLET | ORAL | Status: DC | PRN

## 2022-07-02 MED ORDER — LIDOCAINE HCL (CARDIAC) PF 100 MG/5ML IV SOSY
PREFILLED_SYRINGE | INTRAVENOUS | Status: DC | PRN
  Administered 2022-07-02: 20 mg via INTRATRACHEAL

## 2022-07-02 MED ORDER — KETOROLAC TROMETHAMINE 15 MG/ML IJ SOLN
INTRAMUSCULAR | Status: DC | PRN
  Administered 2022-07-02: 30 mg via INTRAVENOUS

## 2022-07-02 MED ORDER — ONDANSETRON HCL 4 MG PO TABS: 4.0000 mg | ORAL_TABLET | Freq: Four times a day (QID) | ORAL | Status: DC | PRN

## 2022-07-02 MED ORDER — ACETAMINOPHEN 10 MG/ML IV SOLN
INTRAVENOUS | Status: DC | PRN
  Administered 2022-07-02: 700 mg via INTRAVENOUS

## 2022-07-02 MED ORDER — CEFAZOLIN SODIUM-DEXTROSE 2-4 GM/100ML-% IV SOLN
2.0000 g | INTRAVENOUS | Status: AC
  Administered 2022-07-02: 2 g via INTRAVENOUS

## 2022-07-02 MED ORDER — METOCLOPRAMIDE HCL 5 MG/ML IJ SOLN: 5.0000 mg | Freq: Three times a day (TID) | INTRAMUSCULAR | Status: DC | PRN

## 2022-07-02 MED ORDER — BUPIVACAINE HCL (PF) 0.5 % IJ SOLN
INTRAMUSCULAR | Status: DC | PRN
  Administered 2022-07-02: 10 mL

## 2022-07-02 MED ORDER — SODIUM CHLORIDE 0.9 % IV SOLN: INTRAVENOUS | Status: DC

## 2022-07-02 MED ORDER — DEXAMETHASONE SODIUM PHOSPHATE 4 MG/ML IJ SOLN
INTRAMUSCULAR | Status: DC | PRN
  Administered 2022-07-02: 4 mg via INTRAVENOUS

## 2022-07-02 MED ORDER — MORPHINE SULFATE (PF) 2 MG/ML IV SOLN: 0.5000 mg | INTRAVENOUS | Status: DC | PRN

## 2022-07-02 MED ORDER — FENTANYL CITRATE PF 50 MCG/ML IJ SOSY: 25.0000 ug | PREFILLED_SYRINGE | INTRAMUSCULAR | Status: DC | PRN

## 2022-07-02 MED ORDER — LACTATED RINGERS IV SOLN: INTRAVENOUS | Status: DC

## 2022-07-02 SURGICAL SUPPLY — 26 items
APL PRP STRL LF DISP 70% ISPRP (MISCELLANEOUS) ×1
BANDAGE GAUZE 1X75IN STRL (MISCELLANEOUS) ×1 IMPLANT
BNDG CMPR 75X11 PLY HI ABS (MISCELLANEOUS) ×1
BNDG GAUZE 1X75IN STRL (MISCELLANEOUS) ×1
CHLORAPREP W/TINT 26 (MISCELLANEOUS) ×1 IMPLANT
DRAPE FLUOR MINI C-ARM 54X84 (DRAPES) ×1 IMPLANT
ELECT REM PT RETURN 9FT ADLT (ELECTROSURGICAL) ×1
ELECTRODE REM PT RTRN 9FT ADLT (ELECTROSURGICAL) ×1 IMPLANT
GAUZE SPONGE 4X4 12PLY STRL (GAUZE/BANDAGES/DRESSINGS) ×1 IMPLANT
GAUZE XEROFORM 1X8 LF (GAUZE/BANDAGES/DRESSINGS) ×1 IMPLANT
GLOVE SURG SYN 9.0  PF PI (GLOVE) ×1
GLOVE SURG SYN 9.0 PF PI (GLOVE) ×1 IMPLANT
GOWN SRG 2XL LVL 4 RGLN SLV (GOWNS) ×1 IMPLANT
GOWN STRL NON-REIN 2XL LVL4 (GOWNS) ×1
GOWN STRL REUS W/ TWL LRG LVL3 (GOWN DISPOSABLE) ×1 IMPLANT
GOWN STRL REUS W/TWL LRG LVL3 (GOWN DISPOSABLE) ×1
KIT TURNOVER KIT A (KITS) ×1 IMPLANT
MANIFOLD NEPTUNE II (INSTRUMENTS) ×1 IMPLANT
MICROAIRE K-WIRE 1.1 MM X 152 MM (Orthopedic Implant) IMPLANT
NS IRRIG 500ML POUR BTL (IV SOLUTION) ×1 IMPLANT
PACK EXTREMITY ARMC (MISCELLANEOUS) ×1 IMPLANT
PAD PREP 24X41 OB/GYN DISP (PERSONAL CARE ITEMS) ×1 IMPLANT
PADDING CAST COTTON 2X4 ST (MISCELLANEOUS) ×1 IMPLANT
SCALPEL PROTECTED #15 DISP (BLADE) ×2 IMPLANT
SPONGE GAUZE 2X2 8PLY STRL LF (GAUZE/BANDAGES/DRESSINGS) ×1 IMPLANT
WATER STERILE IRR 500ML POUR (IV SOLUTION) ×1 IMPLANT

## 2022-07-02 NOTE — Discharge Instructions (Signed)
Keep arm elevated is much as possible Try not to move the little and ring fingers Pain medicine as directed Call office if you are having problems.  [phone]

## 2022-07-02 NOTE — Anesthesia Postprocedure Evaluation (Signed)
Anesthesia Post Note  Patient: Laura Hale  Procedure(s) Performed: Left 5th proxomal phalanx closed reduction & percutaneous pinning (Left: Finger)  Patient location during evaluation: PACU Anesthesia Type: General Level of consciousness: awake and alert Pain management: pain level controlled Vital Signs Assessment: post-procedure vital signs reviewed and stable Respiratory status: spontaneous breathing, nonlabored ventilation, respiratory function stable and patient connected to nasal cannula oxygen Cardiovascular status: stable and blood pressure returned to baseline Postop Assessment: no apparent nausea or vomiting Anesthetic complications: no  There were no known notable events for this encounter.   Last Vitals:  Vitals:   07/02/22 1425 07/02/22 1431  BP:  136/74  Pulse: 63 64  Resp: 15 17  Temp:    SpO2: 98% 100%    Last Pain:  Vitals:   07/02/22 1420  TempSrc:   PainSc: 0-No pain                 Dimas Millin

## 2022-07-02 NOTE — Anesthesia Preprocedure Evaluation (Addendum)
Anesthesia Evaluation  Patient identified by MRN, date of birth, ID band Patient awake    Reviewed: Allergy & Precautions, NPO status , Patient's Chart, lab work & pertinent test results  History of Anesthesia Complications (+) DIFFICULT AIRWAY and history of anesthetic complications  Airway Mallampati: III  TM Distance: >3 FB Neck ROM: full    Dental  (+) Teeth Intact, Dental Advidsory Given, Poor Dentition   Pulmonary asthma , sleep apnea , neg COPD, former smoker   Pulmonary exam normal  + decreased breath sounds      Cardiovascular Exercise Tolerance: Good hypertension, On Medications (-) Past MI and (-) CABG Normal cardiovascular exam Rhythm:Regular     Neuro/Psych  PSYCHIATRIC DISORDERS      negative neurological ROS     GI/Hepatic negative GI ROS, Neg liver ROS,,,  Endo/Other  negative endocrine ROS  Morbid obesity  Renal/GU negative Renal ROS     Musculoskeletal negative musculoskeletal ROS (+)    Abdominal  (+) + obese  Peds negative pediatric ROS (+)  Hematology negative hematology ROS (+)   Anesthesia Other Findings Past Medical History: No date: Arthritis     Comment:  elbow, hands, neck  No date: Asthma     Comment:  allergy induced asthma-only exacerbated by smells like               perfume, smoke etc 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  couldn't talk due to tube No date: Edema     Comment:  LEGS/FEET 2009: Hypercalcemia     Comment:  calcium  WNL 10.2 on 04-2015 No  date: Hyperlipidemia No date: Joint stiffness of hand, left     Comment:  cannot lay hand flat S/P elbow surgery No date: Obesity No date: Pre-diabetes No date: Sleep apnea     Comment:  No CPAP No date: Urinary incontinence  Past Surgical History: No date: ABDOMINAL HYSTERECTOMY     Comment:  one ovary remains 01/31/2015: ANTERIOR CERVICAL DECOMP/DISCECTOMY FUSION; N/A     Comment:  Procedure: ANTERIOR CERVICAL DECOMPRESSION/DISCECTOMY               FUSION CERVICAL FIVE SIX;  Surgeon: Karie Chimera, MD;                Location: [facility];  Service: Neurosurgery;                Laterality: N/A; No date: BACK SURGERY     Comment:  C 6-C7 neck  02/06/2017: CATARACT EXTRACTION W/PHACO; Left     Comment:  Procedure: CATARACT EXTRACTION PHACO AND INTRAOCULAR               LENS PLACEMENT (IOC);  Surgeon: Eulogio Bear, MD;                Location: [facility];  Service: Ophthalmology;  Laterality:  Left;  Lot# [id number] H [place]: 00:37.9 AP%: 7.9 CDE: 2.97 03/06/2017: CATARACT EXTRACTION W/PHACO; Right     Comment:  Procedure: CATARACT EXTRACTION PHACO AND INTRAOCULAR               LENS PLACEMENT (IOC);  Surgeon: Eulogio Bear, MD;                Location: [facility];  Service: Ophthalmology;  Laterality:              Right;  Lot #[id number] H [place]: 00.22.0 AP%: 7.5 CDE: 1.64  No date: COLONOSCOPY 09/23/2017: COLONOSCOPY WITH PROPOFOL; N/A     Comment:  Procedure: COLONOSCOPY WITH PROPOFOL;  Surgeon: [place],               Benay Pike, MD;  Location: [facility];  Service:               Gastroenterology;  Laterality: N/A; 04/2003: ELBOW SURGERY; Left     Comment:  x2  & for repair & then remove all hardware, due to               injury from a fall No date: EYE SURGERY; Bilateral     Comment:  cataracts No date: FRACTURE SURGERY; Right     Comment:  shoulder and arm 2003: FRACTURE SURGERY; Left     Comment:  elbow 2006: HAND SURGERY; Right     Comment:  injury- then  eventually lost remainder of thumb 01/23/2016: KNEE ARTHROSCOPY WITH LATERAL MENISECTOMY; Left     Comment:  Procedure: KNEE ARTHROSCOPY WITH LATERAL MENISECTOMY;                Surgeon: Corky Mull, MD;  Location: [facility];  Service:              Orthopedics;  Laterality: Left; 08/31/2015: ORIF HUMERUS FRACTURE; Right     Comment:  Procedure: OPEN REDUCTION INTERNAL FIXATION (ORIF)               PROXIMAL HUMERUS FRACTURE w/ #2 fberwire;  Surgeon: Corky Mull, MD;  Location: [facility];  Service: Orthopedics;               Laterality: Right; 2005: THUMB AMPUTATION     Comment:  partial 06/21/2020: TOTAL HIP ARTHROPLASTY; Left     Comment:  Procedure: TOTAL HIP ARTHROPLASTY;  Surgeon: Dereck Leep, MD;  Location: [facility];  Service: Orthopedics;               Laterality: Left;  BMI    Body Mass Index: 37.92 kg/m      Reproductive/Obstetrics negative OB ROS                             Anesthesia Physical Anesthesia Plan  ASA: III  Anesthesia Plan: General   Post-op Pain Management:    Induction: Intravenous  PONV Risk Score and Plan: 3 and Ondansetron and Dexamethasone  Airway Management Planned: LMA  Additional Equipment:   Intra-op Plan:   Post-operative Plan: Extubation in OR  Informed Consent: I have reviewed the patients History and Physical, chart, labs and discussed the procedure including the risks, benefits and alternatives for the proposed anesthesia with the patient or authorized representative who has indicated his/her understanding  and acceptance.     Dental Advisory Given  Plan Discussed with: Anesthesiologist, CRNA and Surgeon  Anesthesia Plan Comments: (Patient consented for risks of anesthesia including but not limited to:  - adverse reactions to medications - damage to eyes, teeth, lips or other oral mucosa - nerve damage due to positioning  - sore throat or hoarseness - Damage to heart,  brain, nerves, lungs, other parts of body or loss of life  Patient voiced understanding.)        Anesthesia Quick Evaluation

## 2022-07-02 NOTE — Transfer of Care (Signed)
Immediate Anesthesia Transfer of Care Note  Patient: Laura Hale  Procedure(s) Performed: Left 5th proxomal phalanx closed reduction & percutaneous pinning (Left: Finger)  Patient Location: PACU  Anesthesia Type: General  Level of Consciousness: awake, alert  and patient cooperative  Airway and Oxygen Therapy: Patient Spontanous Breathing and Patient connected to supplemental oxygen  Post-op Assessment: Post-op Vital signs reviewed, Patient's Cardiovascular Status Stable, Respiratory Function Stable, Patent Airway and No signs of Nausea or vomiting  Post-op Vital Signs: Reviewed and stable  Complications: No notable events documented.

## 2022-07-02 NOTE — H&P (Signed)
Tamala Julian [place], [place] - 06/28/2022 9:30 AM EST Formatting of this note is different from the original. [facility] Chief Complaint:  Chief Complaint Patient presents with Hand Injury LEFT HAND INJURY; DOI: 06/27/2022  History of Present Illness:  NIKO JAKEL is a 76 y.o. female that presents to clinic today for initial evaluation and management of left fifth finger proximal phalanx fracture sustained 06/27/2022. Patient was walking yesterday when she tripped and fell, catching the left hand on a guardrail between the fourth and fifth digits. Patient sustained laceration deformity of the fifth digit and was seen in the emergency department. X-rays revealed presence of proximal metacarpal base fracture. Patient underwent laceration repair and subsequent reduction of the fracture with splint placement and was referred to orthopedics for definitive management.  Patient presents today with her husband. Has been compliant with use of splint. Denies any numbness or tingling.  Patient also requests antibiotic prophylaxis due to history of joint replacement for an upcoming dental appointment .  She is right hand dominant.  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 Type 2 diabetes mellitus without complication, without long-term current use of insulin (CMS-HCC)  02/05/2022 Type 2 diabetes mellitus without complication, without long-term current use of insulin (CMS-HCC) 02/05/2022 Urinary incontinence, mixed  Past Surgical History: Past Surgical History: Procedure Laterality Date OTHER SURGERY Left 2004 arm, left elbow fracture Partial removal of right thumb 2005 COLONOSCOPY 07/03/2004 adenomatous polyp, FH colon cancer 1st degree relative COLONOSCOPY 04/14/2007 normal, PH adenomatous polyp COLONOSCOPY 06/09/2012 hyperplastic polyp, repeat 5 years ([place]) ANTERIOR FUSION CERVICAL SPINE 2016 Dr. Hal Neer DISCECTOMY ANTERIOR CERVICLE W/DECOMP 01/2015 ORIF of displaces greater tuberosity fracture, right proximal humerus Right 08/31/2015 Dr.Poggi Extensive arthroscopic debridement with abrasion chondroplasty, excision of loose body, and partial lateral meniscectomy, left knee. Left 01/23/2016 Dr. Roland Rack CATARACT EXTRACTION 2018 Both eyes COLONOSCOPY 09/23/2017 Negative colon biopsy/FHx CC- Mother/PHx CP/Repeat 42yr/TKT Left total hip arthroplasty 06/21/2020 Dr HMarry GuanFRACTURE SURGERY 2017, 2004 HYSTERECTOMY partial hysterectomy JOINT REPLACEMENT Left hip 2021 KNEE ARTHROSCOPY  Current Medications: Current Outpatient Medications Medication Sig Dispense Refill amoxicillin (AMOXIL) 500 MG capsule For dental procedures aspirin 81 MG EC tablet Take 81 mg by mouth once daily atorvastatin (LIPITOR) 80 MG tablet Take 1 tablet (80 mg total) by mouth once daily 90 tablet 3 cephalexin (KEFLEX) 500 MG capsule Take 500 mg by mouth 4 (four) times daily for 7 days. cholecalciferol (VITAMIN D3) 1000 unit tablet Take 2,000 Units by mouth Compound Medication Cream of 3 diff meds for rosacea Includes metrocream diphenhydrAMINE-acetaminophen (TYLENOL PM EXTRA STRENGTH) 25-500 mg per tablet Take 1 tablet by mouth nightly as needed hydroCHLOROthiazide (HYDRODIURIL) 25 MG tablet Take 1 tablet (25 mg total) by mouth once daily 90 tablet  4 HYDROcodone-acetaminophen (NORCO) 5-325 mg tablet Take 1 tablet by mouth every 6 (six) hours as needed for Pain loratadine (CLARITIN) 10 mg tablet Take 10 mg by mouth once daily mupirocin (BACTROBAN) 2 % ointment Apply to skin qd-bid semaglutide (OZEMPIC) 1 mg/dose (4 mg/3 mL) pen injector Inject 0.75 mLs (  1 mg total) subcutaneously once a week 3 mL 11 amoxicillin (AMOXIL) 500 MG capsule Take 4 capsules 1 hour before the dental procedure 4 capsule 2  No current facility-administered medications for this visit.  Allergies: No Known Allergies  Social History: Social History  Socioeconomic History Marital status: Married Spouse name: MYLAH BAYNES Number of children: 0 Years of education: 96 Occupational History Occupation: Retired Tobacco Use Smoking status: Former Packs/day: 1.00 Years: 15.00 Additional pack years: 0.00 Total pack years: 15.00 Types: Cigarettes Start date: 08/13/1971 Quit date: 08/12/1986 Years since quitting: 35.9 Smokeless tobacco: Never Tobacco comments: stopped age 59 Vaping Use Vaping Use: Never used Substance and Sexual Activity Alcohol use: Yes Alcohol/week: 3.0 - 5.0 standard drinks of alcohol Types: 3 - 5 Glasses of wine per week Comment: 3-5 glasses wine a week Drug use: No Sexual activity: Not Currently Partners: Male Birth control/protection: Post-menopausal  Family History: Family History Problem Relation Age of Onset Colon cancer Mother 52 Lung cancer Mother High blood pressure (Hypertension) Mother Osteoporosis (Thinning of bones) Mother Heart disease Brother High blood pressure (Hypertension) Brother Stroke Father High blood pressure (Hypertension) Father Breast cancer Sister 35 Lung cancer Sister Breast cancer Sister Hyperlipidemia (Elevated cholesterol) Sister High blood pressure (Hypertension) Sister Osteoarthritis Sister Coronary Artery Disease (Blocked arteries around heart) Brother Hyperlipidemia (Elevated  cholesterol) Brother High blood pressure (Hypertension) Brother Diabetes type II Brother Hyperlipidemia (Elevated cholesterol) Brother Osteoarthritis Sister Osteoporosis (Thinning of bones) Sister Osteoarthritis Sister  Review of Systems:  A 10+ ROS was performed, reviewed, and the pertinent orthopaedic findings are documented in the HPI.  Physical Examination:  BP 124/80 (BP Location: Left upper arm, Patient Position: Sitting, BP Cuff Size: Large Adult)  Ht 165.1 cm ('5\' 5"'$ )  Wt (!) 106.9 kg (235 lb 9.6 oz)  BMI 39.21 kg/m  Patient is a well-developed, well-nourished female in no acute distress. Patient has normal mood and affect. Patient is alert and oriented to person, place, and time. Pupils are equal and round with synchronous movement. No injected sclera. There is no palpable lymphadenopathy. Respirations are normal, without noticeable retractions.  Cardiovascular: Regular rate and rhythm, without murmurs/rubs/gallops  Respiratory: Lungs clear to auscultation bilaterally  Present presents with the left hand in a ulnar gutter splint. Sensation intact over the ends of the exposed fingers. Full sensation and range of motion of the left thumb.  Tests Performed/Reviewed: X-rays  3 views of the left hand were obtained. Images reveal displaced fracture of the left fifth proximal phalanx. Dorsal angulation is noted, but is much improved from previous x-ray. Presence of splint noted.   I personally reviewed and visualized the imaging studies if available. I additionally personally interpreted any radiographs taken during today's visit.  Impression:  ICD-10-CM 1. Displaced fracture of proximal phalanx of left little finger, initial encounter for open fracture S62.617B 2. Status post total replacement of left hip Z96.642  Plan:  -Displaced fracture of proximal phalanx of left little finger sustained 06/27/2022 Discussed both operative and nonoperative treatment options. Advised  that without operative treatment patient would have resulting deformity of the finger. Patient would like to proceed with operative treatment. Dr. Rudene Christians briefly spoke with patient and discussed typical postoperative course. Patient would like to proceed with surgical intervention with Dr. Rudene Christians.  Follow-up at postoperative visit for wound check.  Contact our office with any questions or concerns. Follow up as indicated, or sooner should any new problems arise, if conditions worsen, or if they are otherwise concerned.  Gwenlyn Fudge,  PA-C [facility] [address] [place], [place] [address] Phone: [phone]  This note was generated in part with voice recognition software and I apologize for any typographical errors that were not detected and corrected.  Electronically signed by Gwenlyn Fudge, PA at 06/28/2022 10:14 AM EST   Reviewed  H+P. No changes noted.

## 2022-07-02 NOTE — Op Note (Signed)
07/02/2022  2:02 PM  PATIENT:  Laura Hale  75 y.o. female  PRE-OPERATIVE DIAGNOSIS:  Displaced fracture of proximal phalanx of left little finger, initial encounter for open fracture S62.617B  POST-OPERATIVE DIAGNOSIS:  Displaced fracture of proximal phalanx of left little finger, initial encounter for open fracture S62.617B  PROCEDURE:  Procedure(s): Left 5th proxomal phalanx closed reduction & percutaneous pinning (Left)  SURGEON: Laurene Footman, MD  ASSISTANTS: none  ANESTHESIA:   general  EBL:  Total I/O In: 100 [IV Piggyback:100] Out: -   BLOOD ADMINISTERED:none  DRAINS: none   LOCAL MEDICATIONS USED:  MARCAINE     SPECIMEN:  No Specimen  DISPOSITION OF SPECIMEN:  N/A  COUNTS:  YES  TOURNIQUET:  * Missing tourniquet times found for documented tourniquets in log: [id number] *  IMPLANTS: 0.45 K wire x2  DICTATION: .Dragon Dictation patient was brought to the operating room and after adequate general anesthesia was obtained the left arm was prepped and draped in the usual sterile fashion.  After patient identification and timeout procedures were completed the fracture was reduced by holding the MCP joint and PIP joints in the flexed position forcefully pushing down at the distal proximal phalanx to obtain near anatomic alignment in both AP and lateral projections 2 K wires were then inserted in a crossed fashion to hold the fracture in a reduced position after this was completed the wires were bent over cut short and dressed with Xeroform 4 x 4's and a dorsal splint with web roll underneath.  An Ace wrap was then applied and patient sent recovery in stable condition before dressing applied 10 cc of half percent Sensorcaine was infiltrated on the ulnar border of the hand to achieve block for postop pain relief.  PLAN OF CARE: Discharge to home after PACU  PATIENT DISPOSITION:  PACU - hemodynamically stable.

## 2022-07-03 ENCOUNTER — Encounter: Payer: Self-pay | Admitting: Orthopedic Surgery

## 2022-07-08 DIAGNOSIS — S62617B Displaced fracture of proximal phalanx of left little finger, initial encounter for open fracture: Secondary | ICD-10-CM | POA: Diagnosis not present

## 2022-07-24 DIAGNOSIS — S62617D Displaced fracture of proximal phalanx of left little finger, subsequent encounter for fracture with routine healing: Secondary | ICD-10-CM | POA: Diagnosis not present

## 2022-08-01 DIAGNOSIS — E119 Type 2 diabetes mellitus without complications: Secondary | ICD-10-CM | POA: Diagnosis not present

## 2022-08-01 DIAGNOSIS — M5020 Other cervical disc displacement, unspecified cervical region: Secondary | ICD-10-CM | POA: Diagnosis not present

## 2022-08-01 DIAGNOSIS — R899 Unspecified abnormal finding in specimens from other organs, systems and tissues: Secondary | ICD-10-CM | POA: Diagnosis not present

## 2022-08-01 DIAGNOSIS — E559 Vitamin D deficiency, unspecified: Secondary | ICD-10-CM | POA: Diagnosis not present

## 2022-08-01 DIAGNOSIS — I700 Atherosclerosis of aorta: Secondary | ICD-10-CM | POA: Diagnosis not present

## 2022-08-01 DIAGNOSIS — E7849 Other hyperlipidemia: Secondary | ICD-10-CM | POA: Diagnosis not present

## 2022-08-01 DIAGNOSIS — G4733 Obstructive sleep apnea (adult) (pediatric): Secondary | ICD-10-CM | POA: Diagnosis not present

## 2022-08-01 DIAGNOSIS — N390 Urinary tract infection, site not specified: Secondary | ICD-10-CM | POA: Diagnosis not present

## 2022-08-08 DIAGNOSIS — G629 Polyneuropathy, unspecified: Secondary | ICD-10-CM | POA: Diagnosis not present

## 2022-08-08 DIAGNOSIS — G4733 Obstructive sleep apnea (adult) (pediatric): Secondary | ICD-10-CM | POA: Diagnosis not present

## 2022-08-08 DIAGNOSIS — R918 Other nonspecific abnormal finding of lung field: Secondary | ICD-10-CM | POA: Diagnosis not present

## 2022-08-08 DIAGNOSIS — I10 Essential (primary) hypertension: Secondary | ICD-10-CM | POA: Diagnosis not present

## 2022-08-08 DIAGNOSIS — I700 Atherosclerosis of aorta: Secondary | ICD-10-CM | POA: Diagnosis not present

## 2022-08-08 DIAGNOSIS — E119 Type 2 diabetes mellitus without complications: Secondary | ICD-10-CM | POA: Diagnosis not present

## 2022-08-08 DIAGNOSIS — E7849 Other hyperlipidemia: Secondary | ICD-10-CM | POA: Diagnosis not present

## 2022-08-08 DIAGNOSIS — J4489 Other specified chronic obstructive pulmonary disease: Secondary | ICD-10-CM | POA: Diagnosis not present

## 2022-08-08 DIAGNOSIS — Z0000 Encounter for general adult medical examination without abnormal findings: Secondary | ICD-10-CM | POA: Diagnosis not present

## 2022-08-08 DIAGNOSIS — Z6841 Body Mass Index (BMI) 40.0 and over, adult: Secondary | ICD-10-CM | POA: Diagnosis not present

## 2022-08-08 DIAGNOSIS — H04123 Dry eye syndrome of bilateral lacrimal glands: Secondary | ICD-10-CM | POA: Diagnosis not present

## 2022-08-14 DIAGNOSIS — M19012 Primary osteoarthritis, left shoulder: Secondary | ICD-10-CM | POA: Diagnosis not present

## 2022-08-14 DIAGNOSIS — S62617D Displaced fracture of proximal phalanx of left little finger, subsequent encounter for fracture with routine healing: Secondary | ICD-10-CM | POA: Diagnosis not present

## 2022-08-14 DIAGNOSIS — M79602 Pain in left arm: Secondary | ICD-10-CM | POA: Diagnosis not present

## 2022-08-14 DIAGNOSIS — Z6841 Body Mass Index (BMI) 40.0 and over, adult: Secondary | ICD-10-CM | POA: Diagnosis not present

## 2022-08-14 DIAGNOSIS — E119 Type 2 diabetes mellitus without complications: Secondary | ICD-10-CM | POA: Diagnosis not present

## 2022-08-14 DIAGNOSIS — I700 Atherosclerosis of aorta: Secondary | ICD-10-CM | POA: Diagnosis not present

## 2022-08-15 ENCOUNTER — Ambulatory Visit
Admission: RE | Admit: 2022-08-15 | Discharge: 2022-08-15 | Disposition: A | Discharge status: Home / Self Care | Payer: PPO | Source: Ambulatory Visit | Attending: Internal Medicine | Admitting: Internal Medicine

## 2022-08-15 DIAGNOSIS — Z1231 Encounter for screening mammogram for malignant neoplasm of breast: Secondary | ICD-10-CM | POA: Insufficient documentation

## 2022-10-08 DIAGNOSIS — J4489 Other specified chronic obstructive pulmonary disease: Secondary | ICD-10-CM | POA: Diagnosis not present

## 2022-10-08 DIAGNOSIS — I700 Atherosclerosis of aorta: Secondary | ICD-10-CM | POA: Diagnosis not present

## 2022-10-08 DIAGNOSIS — E119 Type 2 diabetes mellitus without complications: Secondary | ICD-10-CM | POA: Diagnosis not present

## 2022-10-08 DIAGNOSIS — J069 Acute upper respiratory infection, unspecified: Secondary | ICD-10-CM | POA: Diagnosis not present

## 2022-10-08 DIAGNOSIS — J449 Chronic obstructive pulmonary disease, unspecified: Secondary | ICD-10-CM | POA: Diagnosis not present

## 2022-11-20 ENCOUNTER — Other Ambulatory Visit: Payer: Self-pay

## 2022-11-20 ENCOUNTER — Other Ambulatory Visit: Payer: Self-pay | Admitting: Dermatology

## 2022-11-20 DIAGNOSIS — L219 Seborrheic dermatitis, unspecified: Secondary | ICD-10-CM

## 2022-11-20 MED ORDER — FLUOCINOLONE ACETONIDE 0.01 % OT OIL: 1.0000 "application " | TOPICAL_OIL | OTIC | 1 refills | Status: DC

## 2022-11-20 NOTE — Progress Notes (Signed)
Refill request from [facility]. Escripted  

## 2023-01-19 ENCOUNTER — Encounter: Payer: Self-pay | Admitting: Dermatology

## 2023-01-20 ENCOUNTER — Other Ambulatory Visit: Payer: Self-pay

## 2023-01-20 MED ORDER — IVERMECTIN 1 % EX CREA: 1.0000 | TOPICAL_CREAM | Freq: Every day | CUTANEOUS | 11 refills | Status: AC

## 2023-01-20 NOTE — Progress Notes (Signed)
Triple cream to [facility]

## 2023-01-30 DIAGNOSIS — E7849 Other hyperlipidemia: Secondary | ICD-10-CM | POA: Diagnosis not present

## 2023-01-30 DIAGNOSIS — E119 Type 2 diabetes mellitus without complications: Secondary | ICD-10-CM | POA: Diagnosis not present

## 2023-01-30 DIAGNOSIS — J4489 Other specified chronic obstructive pulmonary disease: Secondary | ICD-10-CM | POA: Diagnosis not present

## 2023-01-30 DIAGNOSIS — I700 Atherosclerosis of aorta: Secondary | ICD-10-CM | POA: Diagnosis not present

## 2023-01-30 DIAGNOSIS — I10 Essential (primary) hypertension: Secondary | ICD-10-CM | POA: Diagnosis not present

## 2023-01-30 DIAGNOSIS — G4733 Obstructive sleep apnea (adult) (pediatric): Secondary | ICD-10-CM | POA: Diagnosis not present

## 2023-02-17 DIAGNOSIS — Z6841 Body Mass Index (BMI) 40.0 and over, adult: Secondary | ICD-10-CM | POA: Diagnosis not present

## 2023-02-17 DIAGNOSIS — G629 Polyneuropathy, unspecified: Secondary | ICD-10-CM | POA: Diagnosis not present

## 2023-02-17 DIAGNOSIS — I700 Atherosclerosis of aorta: Secondary | ICD-10-CM | POA: Diagnosis not present

## 2023-02-17 DIAGNOSIS — E7849 Other hyperlipidemia: Secondary | ICD-10-CM | POA: Diagnosis not present

## 2023-02-17 DIAGNOSIS — J4489 Other specified chronic obstructive pulmonary disease: Secondary | ICD-10-CM | POA: Diagnosis not present

## 2023-02-17 DIAGNOSIS — E119 Type 2 diabetes mellitus without complications: Secondary | ICD-10-CM | POA: Diagnosis not present

## 2023-02-17 DIAGNOSIS — Z124 Encounter for screening for malignant neoplasm of cervix: Secondary | ICD-10-CM | POA: Diagnosis not present

## 2023-02-17 DIAGNOSIS — E559 Vitamin D deficiency, unspecified: Secondary | ICD-10-CM | POA: Diagnosis not present

## 2023-02-17 DIAGNOSIS — I10 Essential (primary) hypertension: Secondary | ICD-10-CM | POA: Diagnosis not present

## 2023-02-17 DIAGNOSIS — M5020 Other cervical disc displacement, unspecified cervical region: Secondary | ICD-10-CM | POA: Diagnosis not present

## 2023-02-20 DIAGNOSIS — K648 Other hemorrhoids: Secondary | ICD-10-CM | POA: Diagnosis not present

## 2023-02-20 DIAGNOSIS — K5909 Other constipation: Secondary | ICD-10-CM | POA: Insufficient documentation

## 2023-02-20 DIAGNOSIS — Z800 Family history of malignant neoplasm of digestive organs: Secondary | ICD-10-CM | POA: Insufficient documentation

## 2023-02-20 DIAGNOSIS — G4733 Obstructive sleep apnea (adult) (pediatric): Secondary | ICD-10-CM | POA: Diagnosis not present

## 2023-02-20 DIAGNOSIS — J4489 Other specified chronic obstructive pulmonary disease: Secondary | ICD-10-CM | POA: Diagnosis not present

## 2023-02-20 DIAGNOSIS — I10 Essential (primary) hypertension: Secondary | ICD-10-CM | POA: Diagnosis not present

## 2023-03-04 DIAGNOSIS — Z124 Encounter for screening for malignant neoplasm of cervix: Secondary | ICD-10-CM | POA: Diagnosis not present

## 2023-05-07 ENCOUNTER — Encounter: Payer: Self-pay | Admitting: Internal Medicine

## 2023-05-13 ENCOUNTER — Encounter: Payer: Self-pay | Admitting: Internal Medicine

## 2023-05-14 ENCOUNTER — Ambulatory Visit
Admission: RE | Admit: 2023-05-14 | Discharge: 2023-05-14 | Disposition: A | Discharge status: Home / Self Care | Payer: PPO | Source: Ambulatory Visit | Attending: Internal Medicine | Admitting: Internal Medicine

## 2023-05-14 ENCOUNTER — Other Ambulatory Visit: Payer: Self-pay

## 2023-05-14 ENCOUNTER — Encounter
Admission: RE | Disposition: A | Discharge status: Home / Self Care | Payer: Self-pay | Source: Ambulatory Visit | Attending: Internal Medicine

## 2023-05-14 ENCOUNTER — Ambulatory Visit: Payer: PPO | Admitting: Registered Nurse

## 2023-05-14 ENCOUNTER — Encounter: Payer: Self-pay | Admitting: Internal Medicine

## 2023-05-14 DIAGNOSIS — Z860101 Personal history of adenomatous and serrated colon polyps: Secondary | ICD-10-CM | POA: Diagnosis not present

## 2023-05-14 DIAGNOSIS — K640 First degree hemorrhoids: Secondary | ICD-10-CM | POA: Insufficient documentation

## 2023-05-14 DIAGNOSIS — Z09 Encounter for follow-up examination after completed treatment for conditions other than malignant neoplasm: Secondary | ICD-10-CM | POA: Diagnosis not present

## 2023-05-14 DIAGNOSIS — M1990 Unspecified osteoarthritis, unspecified site: Secondary | ICD-10-CM | POA: Diagnosis not present

## 2023-05-14 DIAGNOSIS — Z87891 Personal history of nicotine dependence: Secondary | ICD-10-CM | POA: Diagnosis not present

## 2023-05-14 DIAGNOSIS — G4733 Obstructive sleep apnea (adult) (pediatric): Secondary | ICD-10-CM | POA: Insufficient documentation

## 2023-05-14 DIAGNOSIS — J449 Chronic obstructive pulmonary disease, unspecified: Secondary | ICD-10-CM | POA: Diagnosis not present

## 2023-05-14 DIAGNOSIS — Z800 Family history of malignant neoplasm of digestive organs: Secondary | ICD-10-CM | POA: Diagnosis not present

## 2023-05-14 DIAGNOSIS — I10 Essential (primary) hypertension: Secondary | ICD-10-CM | POA: Diagnosis not present

## 2023-05-14 DIAGNOSIS — Z1211 Encounter for screening for malignant neoplasm of colon: Secondary | ICD-10-CM | POA: Insufficient documentation

## 2023-05-14 DIAGNOSIS — K5730 Diverticulosis of large intestine without perforation or abscess without bleeding: Secondary | ICD-10-CM | POA: Diagnosis not present

## 2023-05-14 DIAGNOSIS — E119 Type 2 diabetes mellitus without complications: Secondary | ICD-10-CM | POA: Diagnosis not present

## 2023-05-14 DIAGNOSIS — K649 Unspecified hemorrhoids: Secondary | ICD-10-CM | POA: Diagnosis not present

## 2023-05-14 HISTORY — PX: COLONOSCOPY WITH PROPOFOL: SHX5780

## 2023-05-14 SURGERY — COLONOSCOPY WITH PROPOFOL
Anesthesia: General

## 2023-05-14 MED ORDER — PROPOFOL 10 MG/ML IV BOLUS
INTRAVENOUS | Status: DC | PRN
  Administered 2023-05-14: 50 mg via INTRAVENOUS
  Administered 2023-05-14: 40 mg via INTRAVENOUS
  Administered 2023-05-14: 60 mg via INTRAVENOUS

## 2023-05-14 MED ORDER — GLYCOPYRROLATE 0.2 MG/ML IJ SOLN
INTRAMUSCULAR | Status: AC
  Filled 2023-05-14: qty 1

## 2023-05-14 MED ORDER — GLYCOPYRROLATE 0.2 MG/ML IJ SOLN
INTRAMUSCULAR | Status: DC | PRN
Start: 2023-05-14 — End: 2023-05-14
  Administered 2023-05-14: .2 mg via INTRAVENOUS

## 2023-05-14 MED ORDER — PROPOFOL 500 MG/50ML IV EMUL
INTRAVENOUS | Status: DC | PRN
  Administered 2023-05-14: 150 ug/kg/min via INTRAVENOUS

## 2023-05-14 MED ORDER — SODIUM CHLORIDE 0.9 % IV SOLN: INTRAVENOUS | Status: DC

## 2023-05-14 MED ORDER — LIDOCAINE HCL (CARDIAC) PF 100 MG/5ML IV SOSY
PREFILLED_SYRINGE | INTRAVENOUS | Status: DC | PRN
  Administered 2023-05-14: 20 mg via INTRAVENOUS

## 2023-05-14 NOTE — Interval H&P Note (Signed)
History and Physical Interval Note:  05/14/2023 11:58 AM  Laura Hale  has presented today for surgery, with the diagnosis of V16.0 (ICD-9-CM) - Z80.0 (ICD-10-CM) - Family history of colon cancer.  The various methods of treatment have been discussed with the patient and family. After consideration of risks, benefits and other options for treatment, the patient has consented to  Procedure(s): COLONOSCOPY WITH PROPOFOL (N/A) as a surgical intervention.  The patient's history has been reviewed, patient examined, no change in status, stable for surgery.  I have reviewed the patient's chart and labs.  Questions were answered to the patient's satisfaction.     [place], [place]

## 2023-05-14 NOTE — H&P (Signed)
Outpatient short stay form Pre-procedure 05/14/2023 11:56 AM Raye Slyter K. Norma Fredrickson, M.D.  Primary Physician: Daniel Nones III, M.D.  Reason for visit:  Family history of colon cancer Comments: Mother - age 75.   Essential hypertension  COPD with asthma  Obstructive sleep apnea  Chronic constipation  Other hemorrhoids   History of present illness:  Ms. Ahlberg presents for history of constipation, hemorrhoids and to schedule surveillance colonoscopy for family history of colorectal cancer and personal history of colon polyps. Patient has continued intermittent constipation controlled in the large part secondary to [place] yogurt and fiber supplementation. Occasionally she will have constipation and go 5 days without a bowel movement. She has occasional inflammation of hemorrhoids during constipated episodes which manifested as swelling and mild rectal discomfort. She has no rectal bleeding. Patient has been taking Ozempic for the last several weeks and has lost about 34 pounds.     Current Facility-Administered Medications:    0.9 %  sodium chloride infusion, , Intravenous, Continuous, Central, Boykin Nearing, MD, Last Rate: 20 mL/hr at 05/14/23 1148, Continued from Pre-op at 05/14/23 1148  Medications Prior to Admission  Medication Sig Dispense Refill Last Dose   cetirizine (ZYRTEC) 10 MG tablet Take 10 mg by mouth daily.   05/13/2023   HYDROcodone-acetaminophen (NORCO) 5-325 MG tablet Take 1 tablet by mouth every 6 (six) hours as needed for moderate pain. 15 tablet 0 Past Month   Multiple Vitamin (MULTIVITAMIN) tablet Take 1 tablet by mouth daily.   Past Week   acetaminophen (TYLENOL) 500 MG tablet Take 1,000 mg by mouth every 6 (six) hours as needed for moderate pain.       atorvastatin (LIPITOR) 80 MG tablet Take 80 mg by mouth daily.    05/10/2023   diphenhydramine-acetaminophen (TYLENOL PM) 25-500 MG TABS tablet Take 1 tablet by mouth at bedtime as needed (sleep).      Fluocinolone  Acetonide 0.01 % OIL Place 1 application  in ear(s) as directed. Apply 1-2 gtts to itchy ears qd to bid prn flares 20 mL 1    hydrochlorothiazide (HYDRODIURIL) 25 MG tablet Take 25 mg by mouth daily.   05/10/2023   Ivermectin 1 % CREA Apply 1 Application topically at bedtime. Qhs to face for Rosacea 30 g 11    mupirocin ointment (BACTROBAN) 2 % Apply to skin qd-bid 22 g 1    pimecrolimus (ELIDEL) 1 % cream Apply BID to itchy scaly areas on face 30 g 3    Semaglutide, 1 MG/DOSE, (OZEMPIC, 1 MG/DOSE,) 2 MG/1.5ML SOPN Inject 1 mg into the skin once a week.   04/30/2023     No Known Allergies   Past Medical History:  Diagnosis Date   Arthritis    elbow, hands, neck    Asthma    allergy induced asthma-only exacerbated by smells like perfume, smoke etc   Atypical childhood psychosis 2007   myoview -negative for ischemia with preserved LV function   Basal cell carcinoma ~2006   nasal tip   Colon polyps    Complication of anesthesia    WITH ACDF IN JUNE 2016-PT STATES SHE LOST HER VOICE X 10 WEEKS DUE TO INTUBATION   COPD (chronic obstructive pulmonary disease) (HCC)    Difficult intubation    with last procedure.  couldn't talk due to tube   Edema    LEGS/FEET   Hypercalcemia 2009   calcium  WNL 10.2 on 04-2015   Hyperlipidemia    Joint stiffness of hand, left  cannot lay hand flat S/P elbow surgery   Obesity    Pre-diabetes    Sleep apnea    No CPAP   Urinary incontinence     Review of systems:  Otherwise negative.    Physical Exam  Gen: Alert, oriented. Appears stated age.  HEENT: [place]/AT. PERRLA. Lungs: CTA, no wheezes. CV: RR nl S1, S2. Abd: soft, benign, no masses. BS+ Ext: No edema. Pulses 2+    Planned procedures: Proceed with colonoscopy. The patient understands the nature of the planned procedure, indications, risks, alternatives and potential complications including but not limited to bleeding, infection, perforation, damage to internal organs and possible  oversedation/side effects from anesthesia. The patient agrees and gives consent to proceed.  Please refer to procedure notes for findings, recommendations and patient disposition/instructions.     Khaidyn Staebell K. Norma Fredrickson, M.D. Gastroenterology 05/14/2023  11:56 AM

## 2023-05-14 NOTE — Anesthesia Postprocedure Evaluation (Signed)
Anesthesia Post Note  Patient: Laura Hale  Procedure(s) Performed: COLONOSCOPY WITH PROPOFOL  Patient location during evaluation: Endoscopy Anesthesia Type: General Level of consciousness: awake and alert Pain management: pain level controlled Vital Signs Assessment: post-procedure vital signs reviewed and stable Respiratory status: spontaneous breathing, nonlabored ventilation, respiratory function stable and patient connected to nasal cannula oxygen Cardiovascular status: blood pressure returned to baseline and stable Postop Assessment: no apparent nausea or vomiting Anesthetic complications: no   No notable events documented.   Last Vitals:  Vitals:   05/14/23 1238 05/14/23 1248  BP: 106/60 136/78  Pulse: 91 89  Resp: 16 19  Temp:    SpO2: 97% 96%    Last Pain:  Vitals:   05/14/23 1248  TempSrc:   PainSc: 0-No pain                 Corinda Gubler

## 2023-05-14 NOTE — Transfer of Care (Signed)
Immediate Anesthesia Transfer of Care Note  Patient: Laura Hale  Procedure(s) Performed: COLONOSCOPY WITH PROPOFOL  Patient Location: PACU  Anesthesia Type:General  Level of Consciousness: drowsy  Airway & Oxygen Therapy: Patient Spontanous Breathing  Post-op Assessment: Report given to RN and Post -op Vital signs reviewed and stable  Post vital signs: Reviewed and stable  Last Vitals:  Vitals Value Taken Time  BP 106/60 05/14/23 1238  Temp 97.5   Pulse 90 05/14/23 1238  Resp 16 05/14/23 1238  SpO2 96 % 05/14/23 1238  Vitals shown include unfiled device data.  Last Pain:  Vitals:   05/14/23 1238  TempSrc:   PainSc: Asleep         Complications: No notable events documented.

## 2023-05-14 NOTE — Op Note (Signed)
[facility] Patient Name: Laura Hale Procedure Date: 05/14/2023 11:47 AM MRN: [id number] Account #: [id number] Date of Birth: 1945-10-01 Admit Type: Outpatient Age: 77 Room: [facility] ENDO ROOM 2 Gender: Female Note Status: Finalized Instrument Name: Prentice Docker [id number] Procedure:             Colonoscopy Indications:           Surveillance: Personal history of adenomatous polyps                         on last colonoscopy > 3 years ago Providers:             Royce Macadamia K. Norma Fredrickson MD, MD Referring MD:          Daniel Nones, MD (Referring MD) Medicines:             Propofol per Anesthesia Complications:         No immediate complications. Estimated blood loss: None. Procedure:             Pre-Anesthesia Assessment:                        - The risks and benefits of the procedure and the                         sedation options and risks were discussed with the                         patient. All questions were answered and informed                         consent was obtained.                        - Patient identification and proposed procedure were                         verified prior to the procedure by the nurse. The                         procedure was verified in the procedure room.                        - ASA Grade Assessment: III - A patient with severe                         systemic disease.                        - After reviewing the risks and benefits, the patient                         was deemed in satisfactory condition to undergo the                         procedure.                        After obtaining informed consent, the colonoscope was  passed under direct vision. Throughout the procedure,                         the patient's blood pressure, pulse, and oxygen                         saturations were monitored continuously. The                         Colonoscope was introduced through the anus  and                         advanced to the the cecum, identified by appendiceal                         orifice and ileocecal valve. The colonoscopy was                         performed without difficulty. The patient tolerated                         the procedure well. The quality of the bowel                         preparation was adequate. The ileocecal valve,                         appendiceal orifice, and rectum were photographed. Findings:      The perianal and digital rectal examinations were normal. Pertinent       negatives include normal sphincter tone and no palpable rectal lesions.      Many large-mouthed and medium-mouthed diverticula were found in the       entire colon. There was no evidence of diverticular bleeding.      Non-bleeding internal hemorrhoids were found during retroflexion. The       hemorrhoids were Grade I (internal hemorrhoids that do not prolapse).      The exam was otherwise without abnormality. Impression:            - Mild diverticulosis in the entire examined colon.                         There was no evidence of diverticular bleeding.                        - Non-bleeding internal hemorrhoids.                        - The examination was otherwise normal.                        - No specimens collected. Recommendation:        - Patient has a contact number available for                         emergencies. The signs and symptoms of potential                         delayed complications were discussed with the patient.  Return to normal activities tomorrow. Written                         discharge instructions were provided to the patient.                        - Resume previous diet.                        - Continue present medications.                        - You do NOT require further colon cancer screening                         measures (Annual stool testing (i.e. hemoccult, FIT,                          cologuard), sigmoidoscopy, colonoscopy or CT                         colonography). You should share this recommendation                         with your Primary Care provider.                        - Return to GI office PRN.                        - The findings and recommendations were discussed with                         the patient. Procedure Code(s):     --- Professional ---                        Z6109, Colorectal cancer screening; colonoscopy on                         individual at high risk Diagnosis Code(s):     --- Professional ---                        K57.30, Diverticulosis of large intestine without                         perforation or abscess without bleeding                        K64.0, First degree hemorrhoids                        Z86.010, Personal history of colonic polyps CPT copyright 2022 American Medical Association. All rights reserved. The codes documented in this report are preliminary and upon coder review may  be revised to meet current compliance requirements. Stanton Kidney MD, MD 05/14/2023 12:37:28 PM This report has been signed electronically. Number of Addenda: 0 Note Initiated On: 05/14/2023 11:47 AM Scope Withdrawal Time: 0 hours 8 minutes 39 seconds  Total Procedure Duration: 0 hours 19 minutes 16 seconds  Estimated Blood Loss:  Estimated blood loss: none.  [facility]

## 2023-05-14 NOTE — Anesthesia Preprocedure Evaluation (Signed)
Anesthesia Evaluation  Patient identified by MRN, date of birth, ID band Patient awake    Reviewed: Allergy & Precautions, NPO status , Patient's Chart, lab work & pertinent test results  History of Anesthesia Complications (+) DIFFICULT AIRWAY and history of anesthetic complications  Airway Mallampati: III  TM Distance: >3 FB Neck ROM: full    Dental  (+) Teeth Intact, Dental Advidsory Given, Poor Dentition   Pulmonary neg shortness of breath, asthma , sleep apnea , neg COPD, Recent URI  (Covid one month ago), Resolved, former smoker   Pulmonary exam normal  + decreased breath sounds      Cardiovascular Exercise Tolerance: Good hypertension, On Medications (-) angina (-) Past MI and (-) CABG Normal cardiovascular exam(-) dysrhythmias (-) Valvular Problems/Murmurs Rhythm:Regular     Neuro/Psych  PSYCHIATRIC DISORDERS      negative neurological ROS     GI/Hepatic negative GI ROS, Neg liver ROS,,,  Endo/Other  diabetes (borderline)    Renal/GU negative Renal ROS     Musculoskeletal negative musculoskeletal ROS (+)    Abdominal  (+) + obese  Peds negative pediatric ROS (+)  Hematology negative hematology ROS (+)   Anesthesia Other Findings Past Medical History: No date: Arthritis     Comment:  elbow, hands, neck  No date: Asthma     Comment:  allergy induced asthma-only exacerbated by smells like               perfume, smoke etc 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  couldn't talk due to  tube No date: Edema     Comment:  LEGS/FEET 2009: Hypercalcemia     Comment:  calcium  WNL 10.2 on 04-2015 No date: Hyperlipidemia No date: Joint stiffness of hand, left     Comment:  cannot lay hand flat S/P elbow surgery No date: Obesity No date: Pre-diabetes No date: Sleep apnea     Comment:  No CPAP No date: Urinary incontinence  Past Surgical History: No date: ABDOMINAL HYSTERECTOMY     Comment:  one ovary remains 01/31/2015: ANTERIOR CERVICAL DECOMP/DISCECTOMY FUSION; N/A     Comment:  Procedure: ANTERIOR CERVICAL DECOMPRESSION/DISCECTOMY               FUSION CERVICAL FIVE SIX;  Surgeon: Aliene Beams, MD;                Location: [facility];  Service: Neurosurgery;                Laterality: N/A; No date: BACK SURGERY     Comment:  C 6-C7 neck  02/06/2017: CATARACT EXTRACTION W/PHACO; Left     Comment:  Procedure: CATARACT EXTRACTION PHACO AND INTRAOCULAR               LENS PLACEMENT (IOC);  Surgeon: Nevada Crane, MD;  Location: [facility];  Service: Ophthalmology;  Laterality:              Left;  Lot# [id number] H [place]: 00:37.9 AP%: 7.9 CDE: 2.97 03/06/2017: CATARACT EXTRACTION W/PHACO; Right     Comment:  Procedure: CATARACT EXTRACTION PHACO AND INTRAOCULAR               LENS PLACEMENT (IOC);  Surgeon: Nevada Crane, MD;                Location: [facility];  Service: Ophthalmology;  Laterality:              Right;  Lot #[id number] H [place]: 00.22.0 AP%: 7.5 CDE: 1.64  No date: COLONOSCOPY 09/23/2017: COLONOSCOPY WITH PROPOFOL; N/A     Comment:  Procedure: COLONOSCOPY WITH PROPOFOL;  Surgeon: [place],               Boykin Nearing, MD;  Location: [facility];  Service:               Gastroenterology;  Laterality: N/A; 04/2003: ELBOW SURGERY; Left     Comment:  x2  & for repair & then remove all hardware, due to               injury from a fall No date: EYE SURGERY; Bilateral     Comment:  cataracts No date: FRACTURE SURGERY; Right     Comment:  shoulder and  arm 2003: FRACTURE SURGERY; Left     Comment:  elbow 2006: HAND SURGERY; Right     Comment:  injury- then eventually lost remainder of thumb 01/23/2016: KNEE ARTHROSCOPY WITH LATERAL MENISECTOMY; Left     Comment:  Procedure: KNEE ARTHROSCOPY WITH LATERAL MENISECTOMY;                Surgeon: Christena Flake, MD;  Location: [facility];  Service:              Orthopedics;  Laterality: Left; 08/31/2015: ORIF HUMERUS FRACTURE; Right     Comment:  Procedure: OPEN REDUCTION INTERNAL FIXATION (ORIF)               PROXIMAL HUMERUS FRACTURE w/ #2 fberwire;  Surgeon: Christena Flake, MD;  Location: [facility];  Service: Orthopedics;               Laterality: Right; 2005: THUMB AMPUTATION     Comment:  partial 06/21/2020: TOTAL HIP ARTHROPLASTY; Left     Comment:  Procedure: TOTAL HIP ARTHROPLASTY;  Surgeon: Donato Heinz, MD;  Location: [facility];  Service: Orthopedics;               Laterality: Left;  BMI    Body Mass Index: 37.92 kg/m      Reproductive/Obstetrics negative OB ROS                             Anesthesia Physical Anesthesia Plan  ASA: 3  Anesthesia Plan: General   Post-op Pain Management:    Induction: Intravenous  PONV Risk Score and Plan: 3 and Propofol infusion and TIVA  Airway Management Planned: Natural Airway and Nasal Cannula  Additional Equipment:   Intra-op Plan:   Post-operative Plan:   Informed Consent: I have reviewed the patients History and Physical, chart, labs and  discussed the procedure including the risks, benefits and alternatives for the proposed anesthesia with the patient or authorized representative who has indicated his/her understanding and acceptance.     Dental Advisory Given  Plan Discussed with: Anesthesiologist, CRNA and Surgeon  Anesthesia Plan Comments: (Patient consented for risks of anesthesia including but not limited to:  - adverse reactions to medications - damage to eyes,  teeth, lips or other oral mucosa - nerve damage due to positioning  - sore throat or hoarseness - Damage to heart, brain, nerves, lungs, other parts of body or loss of life  Patient voiced understanding.)        Anesthesia Quick Evaluation

## 2023-05-15 ENCOUNTER — Encounter: Payer: Self-pay | Admitting: Internal Medicine

## 2023-05-19 ENCOUNTER — Ambulatory Visit: Payer: PPO | Admitting: Dermatology

## 2023-05-19 VITALS — BP 116/71 | HR 71

## 2023-05-19 DIAGNOSIS — D1801 Hemangioma of skin and subcutaneous tissue: Secondary | ICD-10-CM

## 2023-05-19 DIAGNOSIS — L814 Other melanin hyperpigmentation: Secondary | ICD-10-CM | POA: Diagnosis not present

## 2023-05-19 DIAGNOSIS — Z85828 Personal history of other malignant neoplasm of skin: Secondary | ICD-10-CM

## 2023-05-19 DIAGNOSIS — L719 Rosacea, unspecified: Secondary | ICD-10-CM | POA: Diagnosis not present

## 2023-05-19 DIAGNOSIS — L738 Other specified follicular disorders: Secondary | ICD-10-CM | POA: Diagnosis not present

## 2023-05-19 DIAGNOSIS — L570 Actinic keratosis: Secondary | ICD-10-CM | POA: Diagnosis not present

## 2023-05-19 DIAGNOSIS — Z1283 Encounter for screening for malignant neoplasm of skin: Secondary | ICD-10-CM

## 2023-05-19 DIAGNOSIS — D2339 Other benign neoplasm of skin of other parts of face: Secondary | ICD-10-CM | POA: Diagnosis not present

## 2023-05-19 DIAGNOSIS — W908XXA Exposure to other nonionizing radiation, initial encounter: Secondary | ICD-10-CM

## 2023-05-19 DIAGNOSIS — L578 Other skin changes due to chronic exposure to nonionizing radiation: Secondary | ICD-10-CM

## 2023-05-19 DIAGNOSIS — D229 Melanocytic nevi, unspecified: Secondary | ICD-10-CM

## 2023-05-19 DIAGNOSIS — L821 Other seborrheic keratosis: Secondary | ICD-10-CM | POA: Diagnosis not present

## 2023-05-19 DIAGNOSIS — L720 Epidermal cyst: Secondary | ICD-10-CM | POA: Diagnosis not present

## 2023-05-19 DIAGNOSIS — L219 Seborrheic dermatitis, unspecified: Secondary | ICD-10-CM

## 2023-05-19 MED ORDER — FLUOCINOLONE ACETONIDE BODY 0.01 % EX OIL: TOPICAL_OIL | CUTANEOUS | 5 refills | Status: DC

## 2023-05-19 NOTE — Progress Notes (Signed)
Follow-Up Visit   Subjective  Laura Hale is a 77 y.o. female who presents for the following: Skin Cancer Screening and Full Body Skin Exam  The patient presents for Total-Body Skin Exam (TBSE) for skin cancer screening and mole check. The patient has spots, moles and lesions to be evaluated, some may be new or changing and the patient may have concern these could be cancer.  Has persistent scaly pink spot on upper lip to check.  She has h/o BCC nose.   The following portions of the chart were reviewed this encounter and updated as appropriate: medications, allergies, medical history  Review of Systems:  No other skin or systemic complaints except as noted in HPI or Assessment and Plan.  Objective  Well appearing patient in no apparent distress; mood and affect are within normal limits.  A full examination was performed including scalp, head, eyes, ears, nose, lips, neck, chest, axillae, abdomen, back, buttocks, bilateral upper extremities, bilateral lower extremities, hands, feet, fingers, toes, fingernails, and toenails. All findings within normal limits unless otherwise noted below.   Relevant physical exam findings are noted in the Assessment and Plan.  R upper lip at vermillion x 1 Erythematous thin papules/macules with gritty scale.        Assessment & Plan   SKIN CANCER SCREENING PERFORMED TODAY.  ACTINIC DAMAGE - Chronic condition, secondary to cumulative UV/sun exposure - diffuse scaly erythematous macules with underlying dyspigmentation - Recommend daily broad spectrum sunscreen SPF 30+ to sun-exposed areas, reapply every 2 hours as needed.  - Staying in the shade or wearing long sleeves, sun glasses (UVA+UVB protection) and wide brim hats (4-inch brim around the entire circumference of the hat) are also recommended for sun protection.  - Call for new or changing lesions.  LENTIGINES, SEBORRHEIC KERATOSES, HEMANGIOMAS - Benign normal skin lesions -  Benign-appearing - Call for any changes  MELANOCYTIC NEVI - Tan-brown and/or pink-flesh-colored symmetric macules and papules - Benign appearing on exam today - Observation - Call clinic for new or changing moles - Recommend daily use of broad spectrum spf 30+ sunscreen to sun-exposed areas.   Rosacea Face - Mild erythema and telangiectasias mid face.   Chronic condition with duration or expected duration over one year. Currently well-controlled.    Rosacea is a chronic progressive skin condition usually affecting the face of adults, causing redness and/or acne bumps. It is treatable but not curable. It sometimes affects the eyes (ocular rosacea) as well. It may respond to topical and/or systemic medication and can flare with stress, sun exposure, alcohol, exercise, topical steroids (including hydrocortisone/cortisone 10) and some foods.  Daily application of broad spectrum spf 30+ sunscreen to face is recommended to reduce flares.    Continue Skin medicinals rosacea triple cream qhs as needed  AK (actinic keratosis) R upper lip at vermillion x 1  Actinic keratoses are precancerous spots that appear secondary to cumulative UV radiation exposure/sun exposure over time. They are chronic with expected duration over 1 year. A portion of actinic keratoses will progress to squamous cell carcinoma of the skin. It is not possible to reliably predict which spots will progress to skin cancer and so treatment is recommended to prevent development of skin cancer.  Recommend daily broad spectrum sunscreen SPF 30+ to sun-exposed areas, reapply every 2 hours as needed.  Recommend staying in the shade or wearing long sleeves, sun glasses (UVA+UVB protection) and wide brim hats (4-inch brim around the entire circumference of the hat). Call for  new or changing lesions.  Consider bx if not resolved with LN2  Destruction of lesion - R upper lip at vermillion x 1  Destruction method: cryotherapy    Informed consent: discussed and consent obtained   Lesion destroyed using liquid nitrogen: Yes   Region frozen until ice ball extended beyond lesion: Yes   Outcome: patient tolerated procedure well with no complications   Post-procedure details: wound care instructions given   Additional details:  Prior to procedure, discussed risks of blister formation, small wound, skin dyspigmentation, or rare scar following cryotherapy. Recommend Vaseline ointment to treated areas while healing.    Fibrous papule of skin Exam: right nasal prox ala rim - 1.0 mm firm flesh papule.   Benign-appearing.  Observation.  Call clinic for new or changing moles.  Recommend daily use of broad spectrum spf 30+ sunscreen to sun-exposed areas.  History of Basal Cell Carcinoma of the Skin Nasal tip 2006 - No evidence of recurrence today - Recommend regular full body skin exams - Recommend daily broad spectrum sunscreen SPF 30+ to sun-exposed areas, reapply every 2 hours as needed.  - Call if any new or changing lesions are noted between office visits  SEBORRHEIC DERMATITIS Exam: mild erythema ear canals  Chronic condition with duration or expected duration over one year. Currently well-controlled.  Seborrheic Dermatitis is a chronic persistent rash characterized by pinkness and scaling most commonly of the mid face but also can occur on the scalp (dandruff), ears; mid chest, mid back and groin.  It tends to be exacerbated by stress and cooler weather.  People who have neurologic disease may experience new onset or exacerbation of existing seborrheic dermatitis.  The condition is not curable but treatable and can be controlled.  Treatment Plan: Continue Fluocinolone oil to aa's ears QD-BID PRN.  Sebaceous Hyperplasia - Small yellow papules with a central dell face - Benign-appearing - Observe. Call for changes.   Milia - tiny firm white papules face - type of cyst - benign - sometimes these will clear  with nightly OTC adapalene/Differin 0.1% gel or retinol. Samples given - may be extracted if symptomatic - observe   Return in about 1 year (around 05/18/2024) for TBSE, 3 mths AK follow up. Recheck upper lip  I, Cari Caraway, CMA, am acting as scribe for Willeen Niece, MD .   Documentation: I have reviewed the above documentation for accuracy and completeness, and I agree with the above.  Willeen Niece, MD

## 2023-05-19 NOTE — Patient Instructions (Signed)

## 2023-08-04 DIAGNOSIS — J019 Acute sinusitis, unspecified: Secondary | ICD-10-CM | POA: Diagnosis not present

## 2023-08-04 DIAGNOSIS — Z03818 Encounter for observation for suspected exposure to other biological agents ruled out: Secondary | ICD-10-CM | POA: Diagnosis not present

## 2023-08-04 DIAGNOSIS — R051 Acute cough: Secondary | ICD-10-CM | POA: Diagnosis not present

## 2023-08-15 DIAGNOSIS — G629 Polyneuropathy, unspecified: Secondary | ICD-10-CM | POA: Diagnosis not present

## 2023-08-15 DIAGNOSIS — E7849 Other hyperlipidemia: Secondary | ICD-10-CM | POA: Diagnosis not present

## 2023-08-15 DIAGNOSIS — E119 Type 2 diabetes mellitus without complications: Secondary | ICD-10-CM | POA: Diagnosis not present

## 2023-08-15 DIAGNOSIS — E559 Vitamin D deficiency, unspecified: Secondary | ICD-10-CM | POA: Diagnosis not present

## 2023-08-15 DIAGNOSIS — I700 Atherosclerosis of aorta: Secondary | ICD-10-CM | POA: Diagnosis not present

## 2023-08-20 DIAGNOSIS — N3946 Mixed incontinence: Secondary | ICD-10-CM | POA: Diagnosis not present

## 2023-08-20 DIAGNOSIS — M5020 Other cervical disc displacement, unspecified cervical region: Secondary | ICD-10-CM | POA: Diagnosis not present

## 2023-08-20 DIAGNOSIS — J4489 Other specified chronic obstructive pulmonary disease: Secondary | ICD-10-CM | POA: Diagnosis not present

## 2023-08-20 DIAGNOSIS — E559 Vitamin D deficiency, unspecified: Secondary | ICD-10-CM | POA: Diagnosis not present

## 2023-08-20 DIAGNOSIS — R918 Other nonspecific abnormal finding of lung field: Secondary | ICD-10-CM | POA: Diagnosis not present

## 2023-08-20 DIAGNOSIS — K5909 Other constipation: Secondary | ICD-10-CM | POA: Diagnosis not present

## 2023-08-20 DIAGNOSIS — G629 Polyneuropathy, unspecified: Secondary | ICD-10-CM | POA: Diagnosis not present

## 2023-08-20 DIAGNOSIS — E1159 Type 2 diabetes mellitus with other circulatory complications: Secondary | ICD-10-CM | POA: Diagnosis not present

## 2023-08-20 DIAGNOSIS — Z0000 Encounter for general adult medical examination without abnormal findings: Secondary | ICD-10-CM | POA: Diagnosis not present

## 2023-08-20 DIAGNOSIS — I700 Atherosclerosis of aorta: Secondary | ICD-10-CM | POA: Diagnosis not present

## 2023-08-20 DIAGNOSIS — I10 Essential (primary) hypertension: Secondary | ICD-10-CM | POA: Diagnosis not present

## 2023-08-22 ENCOUNTER — Other Ambulatory Visit: Payer: Self-pay | Admitting: Internal Medicine

## 2023-08-22 DIAGNOSIS — R918 Other nonspecific abnormal finding of lung field: Secondary | ICD-10-CM

## 2023-08-22 DIAGNOSIS — I10 Essential (primary) hypertension: Secondary | ICD-10-CM

## 2023-08-29 ENCOUNTER — Ambulatory Visit
Admission: RE | Admit: 2023-08-29 | Discharge: 2023-08-29 | Disposition: A | Discharge status: Home / Self Care | Payer: PPO | Source: Ambulatory Visit | Attending: Internal Medicine | Admitting: Internal Medicine

## 2023-08-29 DIAGNOSIS — I700 Atherosclerosis of aorta: Secondary | ICD-10-CM | POA: Diagnosis not present

## 2023-08-29 DIAGNOSIS — R918 Other nonspecific abnormal finding of lung field: Secondary | ICD-10-CM | POA: Diagnosis not present

## 2023-08-29 DIAGNOSIS — I10 Essential (primary) hypertension: Secondary | ICD-10-CM | POA: Diagnosis not present

## 2023-09-01 ENCOUNTER — Ambulatory Visit: Payer: PPO | Admitting: Dermatology

## 2023-09-01 DIAGNOSIS — L578 Other skin changes due to chronic exposure to nonionizing radiation: Secondary | ICD-10-CM

## 2023-09-01 DIAGNOSIS — L719 Rosacea, unspecified: Secondary | ICD-10-CM

## 2023-09-01 DIAGNOSIS — W908XXA Exposure to other nonionizing radiation, initial encounter: Secondary | ICD-10-CM

## 2023-09-01 DIAGNOSIS — L820 Inflamed seborrheic keratosis: Secondary | ICD-10-CM

## 2023-09-01 DIAGNOSIS — Z872 Personal history of diseases of the skin and subcutaneous tissue: Secondary | ICD-10-CM | POA: Diagnosis not present

## 2023-09-01 NOTE — Progress Notes (Signed)
   Follow-Up Visit   Subjective  Laura Hale is a 78 y.o. female who presents for the following: Actinic keratosis.  The patient has spots, moles and lesions to be evaluated, some may be new or changing. Recheck right upper lip at vermilion. Patient with rosacea and uses SM triple cream at night. Her face has been itchy recently. She uses Elta MD moisturizers.   The following portions of the chart were reviewed this encounter and updated as appropriate: medications, allergies, medical history  Review of Systems:  No other skin or systemic complaints except as noted in HPI or Assessment and Plan.  Objective  Well appearing patient in no apparent distress; mood and affect are within normal limits.  A focused examination was performed of the following areas: face  Relevant exam findings are noted in the Assessment and Plan.  right sideburn Erythematous stuck-on, waxy papule or plaque  Assessment & Plan  INFLAMED SEBORRHEIC KERATOSIS right sideburn vs Seborrheic Dermatitis.  Symptomatic, irritating.  Sample of Zoryve Cream given- start applying daily. Discussed cryotherapy if itch not improving with topical.     ACTINIC DAMAGE - chronic, secondary to cumulative UV radiation exposure/sun exposure over time - diffuse scaly erythematous macules with underlying dyspigmentation - Recommend daily broad spectrum sunscreen SPF 30+ to sun-exposed areas, reapply every 2 hours as needed.  - Recommend staying in the shade or wearing long sleeves, sun glasses (UVA+UVB protection) and wide brim hats (4-inch brim around the entire circumference of the hat). - Call for new or changing lesions.   HISTORY OF PRECANCEROUS ACTINIC KERATOSIS - Right upper vermilion lip, site of PreCancerous Actinic Keratosis clear today. - these may recur and new lesions may form requiring treatment to prevent transformation into skin cancer - observe for new or changing spots and contact [facility] for appointment if occur - photoprotection with sun protective clothing; sunglasses and broad spectrum sunscreen with SPF of at least 30 + and frequent self skin exams recommended - yearly exams by a dermatologist recommended for persons with history of PreCancerous Actinic Keratoses  ROSACEA Exam Mild erythema with telangiectasias at cheeks, nose, chin  Chronic condition with duration or expected duration over one year. Currently well-controlled.   Rosacea is a chronic progressive skin condition usually affecting the face of adults, causing redness and/or acne bumps. It is treatable but not curable. It sometimes affects the eyes (ocular rosacea) as well. It may respond to topical and/or systemic medication and can flare with stress, sun exposure, alcohol, exercise, topical steroids (including hydrocortisone/cortisone 10) and some foods.  Daily application of broad spectrum spf 30+ sunscreen to face is recommended to reduce flares.  Patient denies grittiness of the eyes  Treatment Plan Continue Skin medicinals rosacea triple cream at bedtime.  Continue Cetaphil Cleanser and Elta MD AM and PM.    Return as scheduled, for TBSE.  ICherlyn Labella, CMA, am acting as scribe for Willeen Niece, MD .   Documentation: I have reviewed the above documentation for accuracy and completeness, and I agree with the above.  Willeen Niece, MD

## 2023-09-01 NOTE — Patient Instructions (Signed)

## 2023-09-17 ENCOUNTER — Other Ambulatory Visit: Payer: Self-pay | Admitting: Orthopedic Surgery

## 2023-09-17 ENCOUNTER — Ambulatory Visit
Admission: RE | Admit: 2023-09-17 | Discharge: 2023-09-17 | Disposition: A | Discharge status: Home / Self Care | Payer: PPO | Source: Ambulatory Visit | Attending: Orthopedic Surgery | Admitting: Orthopedic Surgery

## 2023-09-17 DIAGNOSIS — M25551 Pain in right hip: Secondary | ICD-10-CM

## 2023-09-17 DIAGNOSIS — M25451 Effusion, right hip: Secondary | ICD-10-CM | POA: Diagnosis not present

## 2023-09-17 DIAGNOSIS — M1611 Unilateral primary osteoarthritis, right hip: Secondary | ICD-10-CM | POA: Diagnosis not present

## 2023-09-17 DIAGNOSIS — R29898 Other symptoms and signs involving the musculoskeletal system: Secondary | ICD-10-CM

## 2023-09-17 DIAGNOSIS — M7061 Trochanteric bursitis, right hip: Secondary | ICD-10-CM | POA: Diagnosis not present

## 2023-10-03 DIAGNOSIS — M1611 Unilateral primary osteoarthritis, right hip: Secondary | ICD-10-CM | POA: Diagnosis not present

## 2023-10-09 DIAGNOSIS — K5909 Other constipation: Secondary | ICD-10-CM | POA: Diagnosis not present

## 2023-10-09 DIAGNOSIS — M1611 Unilateral primary osteoarthritis, right hip: Secondary | ICD-10-CM | POA: Diagnosis not present

## 2023-10-09 DIAGNOSIS — E1159 Type 2 diabetes mellitus with other circulatory complications: Secondary | ICD-10-CM | POA: Diagnosis not present

## 2023-10-09 DIAGNOSIS — J4489 Other specified chronic obstructive pulmonary disease: Secondary | ICD-10-CM | POA: Diagnosis not present

## 2023-10-26 NOTE — Discharge Instructions (Addendum)
 Instructions after Total Hip Replacement   James P. Angie Fava., M.D.    Dept. of Orthopaedics & Sports Medicine [facility] [address] [place], [place]  [address]  Phone: [phone]   Fax: [phone]        [email or web address]        DIET: Drink plenty of non-alcoholic fluids. Resume your normal diet. Include foods high in fiber.  ACTIVITY:  You may use crutches or a walker with weight-bearing as tolerated, unless instructed otherwise. You may be weaned off of the walker or crutches by your Physical Therapist.  Do NOT reach below the level of your knees or cross your legs until allowed.    Continue doing gentle exercises. Exercising will reduce the pain and swelling, increase motion, and prevent muscle weakness.   Please continue to use the TED compression stockings for 6 weeks. You may remove the stockings at night, but should reapply them in the morning. Do not drive or operate any equipment until instructed.  WOUND CARE:  Continue to use ice packs periodically to reduce pain and swelling. The initial dressing (Aquacel) can remain in place for 7 days (see separate instructions). Keep the incision clean and dry. You may bathe or shower after the staples are removed at the first office visit following surgery.  MEDICATIONS: You may resume your regular medications. Please take the pain medication as prescribed on the medication. Do not take pain medication on an empty stomach. Unless instructed otherwise, you should take an enteric-coated aspirin 81 mg. TWICE a day. (This along with elevation will help reduce the possibility of blood clots/phlebitis in your operated leg.) Use a stool softener (such as Senokot-S or Colace) daily and a laxative (such as Miralax or Dulcolax) as needed to prevent constipation.  Do not drive or drink alcoholic beverages when taking pain medications.  CALL THE OFFICE FOR: Temperature above 101 degrees Excessive bleeding or drainage  on the dressing. Excessive swelling, coldness, or paleness of the toes. Persistent nausea and vomiting.  FOLLOW-UP:  You should have an appointment to return to the office in 6 weeks after surgery. Arrangements have been made for continuation of Physical Therapy (either home therapy or outpatient therapy).     [facility] Department Directory         [email or web address]       [email or web address]          Cardiology  Appointments: [place] - [phone]  Endocrinology  Appointments: [place] [phone] [place] - [phone]  Gastroenterology  Appointments: [place] [phone] [place] - [phone]        General Surgery   Appointments: [facility]  Internal Medicine/Family Medicine  Appointments: [facility] [place] - [phone] [place] - [phone]  Metabolic and Weigh Loss Surgery  Appointments: [facility]        Neurology  Appointments: [place] [phone] [place] - [phone]  Neurosurgery  Appointments: [place]  Obstetrics & Gynecology  Appointments: [place] [phone] [place] - [phone]        Pediatrics  Appointments: [place] [phone] [place] - [phone]  Physiatry  Appointments: [place] [phone]  Physical Therapy  Appointments: [place] - [phone]        Podiatry  Appointments: [place] [phone] [place] - [phone]  Pulmonology  Appointments: [place]  Rheumatology  Appointments: [place] [phone]        [place] Location: [facility]  [address] [place], [place]  [address]  Sherrie Sport  Location: [facility]. [address] [place], [place]  [address]  [place] Location: [facility] [address] [place], [place]  [address]     [employer]  [address] , [place],  [place], [address]. [phone] They will call you to arrange when they can come to see you

## 2023-10-28 DIAGNOSIS — M62838 Other muscle spasm: Secondary | ICD-10-CM | POA: Diagnosis not present

## 2023-10-28 DIAGNOSIS — M1611 Unilateral primary osteoarthritis, right hip: Secondary | ICD-10-CM | POA: Diagnosis not present

## 2023-10-28 DIAGNOSIS — M25551 Pain in right hip: Secondary | ICD-10-CM | POA: Diagnosis not present

## 2023-10-29 ENCOUNTER — Other Ambulatory Visit: Payer: Self-pay

## 2023-10-29 ENCOUNTER — Encounter
Admission: RE | Admit: 2023-10-29 | Discharge: 2023-10-29 | Disposition: A | Discharge status: Home / Self Care | Source: Ambulatory Visit | Attending: Orthopedic Surgery | Admitting: Orthopedic Surgery

## 2023-10-29 VITALS — HR 82 | Temp 97.7°F | Resp 18 | Ht 65.5 in | Wt 227.9 lb

## 2023-10-29 DIAGNOSIS — Z01810 Encounter for preprocedural cardiovascular examination: Secondary | ICD-10-CM | POA: Diagnosis not present

## 2023-10-29 DIAGNOSIS — E119 Type 2 diabetes mellitus without complications: Secondary | ICD-10-CM | POA: Diagnosis not present

## 2023-10-29 DIAGNOSIS — Z01818 Encounter for other preprocedural examination: Secondary | ICD-10-CM | POA: Insufficient documentation

## 2023-10-29 DIAGNOSIS — I10 Essential (primary) hypertension: Secondary | ICD-10-CM

## 2023-10-29 DIAGNOSIS — R8281 Pyuria: Secondary | ICD-10-CM | POA: Insufficient documentation

## 2023-10-29 DIAGNOSIS — R8271 Bacteriuria: Secondary | ICD-10-CM

## 2023-10-29 DIAGNOSIS — M1611 Unilateral primary osteoarthritis, right hip: Secondary | ICD-10-CM | POA: Diagnosis not present

## 2023-10-29 DIAGNOSIS — R8290 Unspecified abnormal findings in urine: Secondary | ICD-10-CM

## 2023-10-29 DIAGNOSIS — Z01812 Encounter for preprocedural laboratory examination: Secondary | ICD-10-CM

## 2023-10-29 HISTORY — DX: Unilateral primary osteoarthritis, left hip: M16.12

## 2023-10-29 HISTORY — DX: Type 2 diabetes mellitus without complications: E11.9

## 2023-10-29 HISTORY — DX: Essential (primary) hypertension: I10

## 2023-10-29 LAB — CBC
HCT: 43.1 % (ref 36.0–46.0)
Hemoglobin: 14.3 g/dL (ref 12.0–15.0)
MCH: 28.6 pg (ref 26.0–34.0)
MCHC: 33.2 g/dL (ref 30.0–36.0)
MCV: 86.2 fL (ref 80.0–100.0)
Platelets: 338 10*3/uL (ref 150–400)
RBC: 5 MIL/uL (ref 3.87–5.11)
RDW: 13.8 % (ref 11.5–15.5)
WBC: 8.6 10*3/uL (ref 4.0–10.5)
nRBC: 0 % (ref 0.0–0.2)

## 2023-10-29 LAB — URINALYSIS, ROUTINE W REFLEX MICROSCOPIC
Bilirubin Urine: NEGATIVE
Glucose, UA: NEGATIVE mg/dL
Hgb urine dipstick: NEGATIVE
Ketones, ur: NEGATIVE mg/dL
Nitrite: NEGATIVE
Protein, ur: NEGATIVE mg/dL
Specific Gravity, Urine: 1.025 (ref 1.005–1.030)
pH: 5 (ref 5.0–8.0)

## 2023-10-29 LAB — HEMOGLOBIN A1C
Hgb A1c MFr Bld: 5.4 % (ref 4.8–5.6)
Mean Plasma Glucose: 108.28 mg/dL

## 2023-10-29 LAB — COMPREHENSIVE METABOLIC PANEL
ALT: 31 U/L (ref 0–44)
AST: 24 U/L (ref 15–41)
Albumin: 4 g/dL (ref 3.5–5.0)
Alkaline Phosphatase: 57 U/L (ref 38–126)
Anion gap: 8 (ref 5–15)
BUN: 14 mg/dL (ref 8–23)
CO2: 28 mmol/L (ref 22–32)
Calcium: 10 mg/dL (ref 8.9–10.3)
Chloride: 102 mmol/L (ref 98–111)
Creatinine, Ser: 0.57 mg/dL (ref 0.44–1.00)
GFR, Estimated: >60 mL/min (ref >60)
Glucose, Bld: 64 mg/dL — ABNORMAL LOW (ref 70–99)
Potassium: 3.8 mmol/L (ref 3.5–5.1)
Sodium: 138 mmol/L (ref 135–145)
Total Bilirubin: 0.9 mg/dL (ref 0.0–1.2)
Total Protein: 6.6 g/dL (ref 6.5–8.1)

## 2023-10-29 LAB — SEDIMENTATION RATE: Sed Rate: 5 mm/h (ref 0–30)

## 2023-10-29 LAB — C-REACTIVE PROTEIN: CRP: 0.6 mg/dL (ref <1.0)

## 2023-10-29 LAB — TYPE AND SCREEN
ABO/RH(D): A POS
Antibody Screen: NEGATIVE

## 2023-10-29 LAB — SURGICAL PCR SCREEN
MRSA, PCR: NEGATIVE
Staphylococcus aureus: NEGATIVE

## 2023-10-29 NOTE — Patient Instructions (Addendum)
 Your procedure is scheduled on:  MONDAY MARCH 31 Report to the Registration Desk on the 1st floor of the [facility] To find out your arrival time, please call [phone] between 1PM - 3PM on:  FRIDAY MARCH 28  If your arrival time is 6:00 am, do not arrive before that time as the [facility] entrance doors do not open until 6:00 am.  REMEMBER: Instructions that are not followed completely may result in serious medical risk, up to and including death; or upon the discretion of your surgeon and anesthesiologist your surgery may need to be rescheduled.  Do not eat food after midnight the night before surgery.  No gum chewing or hard candies.  You may however, drink CLEAR liquids up to 2 hours before you are scheduled to arrive for your surgery. Do not drink anything within 2 hours of your scheduled arrival time.  Clear liquids include: - water  - apple juice without pulp - gatorade (not RED colors) - black coffee or tea (Do NOT add milk or creamers to the coffee or tea) Do NOT drink anything that is not on this list.   In addition, your doctor has ordered for you to drink the provided:  G2  Drinking this carbohydrate drink up to two hours before surgery helps to reduce insulin resistance and improve patient outcomes. Please complete drinking 2 hours before scheduled arrival time.  One week prior to surgery: STARTING MONDAY MARCH 24 Stop Anti-inflammatories (NSAIDS) such as Advil, Aleve, Ibuprofen, Motrin, Naproxen, Naprosyn and Aspirin based products such as Excedrin, Goody's Powder, BC Powder. Stop ANY OVER THE COUNTER supplements until after surgery. Cholecalciferol (VITAMIN D3)   You may however, continue to take Tylenol if needed for pain up until the day of surgery. Semaglutide hold for 7 days prior to surgery, last dose Sunday March 23   **Follow recommendations regarding stopping blood thinners.** aspirin EC continue but DO NOT TAKE THE MORNING OF SURGERY   Continue  taking all of your other prescription medications up until the day of surgery.  ON THE DAY OF SURGERY DO NOT TAKE ANY MEDICATIONS   No Alcohol for 24 hours before or after surgery.  No Smoking including e-cigarettes for 24 hours before surgery.  No chewable tobacco products for at least 6 hours before surgery.  No nicotine patches on the day of surgery.  Do not use any "recreational" drugs for at least a week (preferably 2 weeks) before your surgery.  Please be advised that the combination of cocaine and anesthesia may have negative outcomes, up to and including death. If you test positive for cocaine, your surgery will be cancelled.  On the morning of surgery brush your teeth with toothpaste and water, you may rinse your mouth with mouthwash if you wish. Do not swallow any toothpaste or mouthwash.  Use CHG Soap as directed on instruction sheet.  Do not wear jewelry, make-up, hairpins, clips or nail polish.  For welded (permanent) jewelry: bracelets, anklets, waist bands, etc.  Please have this removed prior to surgery.  If it is not removed, there is a chance that hospital personnel will need to cut it off on the day of surgery.  Do not wear lotions, powders, or perfumes.   Do not shave body hair from the neck down 48 hours before surgery.  Contact lenses, hearing aids and dentures may not be worn into surgery.  Do not bring valuables to the hospital. [facility] is not responsible for any missing/lost belongings or  valuables.   Notify your doctor if there is any change in your medical condition (cold, fever, infection).  Wear comfortable clothing (specific to your surgery type) to the hospital.  After surgery, you can help prevent lung complications by doing breathing exercises.  Take deep breaths and cough every 1-2 hours. Your doctor may order a device called an Incentive Spirometer to help you take deep breaths.  If you are being admitted to the hospital overnight, leave  your suitcase in the car. After surgery it may be brought to your room.  In case of increased patient census, it may be necessary for you, the patient, to continue your postoperative care in the Same Day Surgery department.  If you are being discharged the day of surgery, you will not be allowed to drive home. You will need a responsible individual to drive you home and stay with you for 24 hours after surgery.   If you are taking public transportation, you will need to have a responsible individual with you.  Please call the Pre-admissions Testing Dept. at [phone] if you have any questions about these instructions.  Surgery Visitation Policy:  Patients having surgery or a procedure may have two visitors.  Children under the age of 10 must have an adult with them who is not the patient.  Temporary Visitor Restrictions Due to increasing cases of flu, RSV and COVID-19: Children ages 30 and under will not be able to visit patients in [facility] hospitals under most circumstances.  Inpatient Visitation:    Visiting hours are 7 a.m. to 8 p.m. Up to four visitors are allowed at one time in a patient room. The visitors may rotate out with other people during the day.  One visitor age 65 or older may stay with the patient overnight and must be in the room by 8 p.m.      Pre-operative 5 CHG Bath Instructions   You can play a key role in reducing the risk of infection after surgery. Your skin needs to be as free of germs as possible. You can reduce the number of germs on your skin by washing with CHG (chlorhexidine gluconate) soap before surgery. CHG is an antiseptic soap that kills germs and continues to kill germs even after washing.   DO NOT use if you have an allergy to chlorhexidine/CHG or antibacterial soaps. If your skin becomes reddened or irritated, stop using the CHG and notify one of our RNs at [phone].   Please shower with the CHG soap starting 4 days before surgery  using the following schedule:   STARTING THURSDAY MARCH 27     Please keep in mind the following:  DO NOT shave, including legs and underarms, starting the day of your first shower.   You may shave your face at any point before/day of surgery.  Place clean sheets on your bed the day you start using CHG soap. Use a clean washcloth (not used since being washed) for each shower. DO NOT sleep with pets once you start using the CHG.   CHG Shower Instructions:  If you choose to wash your hair and private area, wash first with your normal shampoo/soap.  After you use shampoo/soap, rinse your hair and body thoroughly to remove shampoo/soap residue.  Turn the water OFF and apply about 3 tablespoons (45 ml) of CHG soap to a CLEAN washcloth.  Apply CHG soap ONLY FROM YOUR NECK DOWN TO YOUR TOES (washing for 3-5 minutes)  DO NOT use CHG  soap on face, private areas, open wounds, or sores.  Pay special attention to the area where your surgery is being performed.  If you are having back surgery, having someone wash your back for you may be helpful. Wait 2 minutes after CHG soap is applied, then you may rinse off the CHG soap.  Pat dry with a clean towel  Put on clean clothes/pajamas   If you choose to wear lotion, please use ONLY the CHG-compatible lotions on the back of this paper.     Additional instructions for the day of surgery: DO NOT APPLY any lotions, deodorants, cologne, or perfumes.   Put on clean/comfortable clothes.  Brush your teeth.  Ask your nurse before applying any prescription medications to the skin.      CHG Compatible Lotions   Aveeno Moisturizing lotion  Cetaphil Moisturizing Cream  Cetaphil Moisturizing Lotion  Clairol Herbal Essence Moisturizing Lotion, Dry Skin  Clairol Herbal Essence Moisturizing Lotion, Extra Dry Skin  Clairol Herbal Essence Moisturizing Lotion, Normal Skin  Curel Age Defying Therapeutic Moisturizing Lotion with Alpha Hydroxy  Curel Extreme  Care Body Lotion  Curel Soothing Hands Moisturizing Hand Lotion  Curel Therapeutic Moisturizing Cream, Fragrance-Free  Curel Therapeutic Moisturizing Lotion, Fragrance-Free  Curel Therapeutic Moisturizing Lotion, Original Formula  Eucerin Daily Replenishing Lotion  Eucerin Dry Skin Therapy Plus Alpha Hydroxy Crme  Eucerin Dry Skin Therapy Plus Alpha Hydroxy Lotion  Eucerin Original Crme  Eucerin Original Lotion  Eucerin Plus Crme Eucerin Plus Lotion  Eucerin TriLipid Replenishing Lotion  Keri Anti-Bacterial Hand Lotion  Keri Deep Conditioning Original Lotion Dry Skin Formula Softly Scented  Keri Deep Conditioning Original Lotion, Fragrance Free Sensitive Skin Formula  Keri Lotion Fast Absorbing Fragrance Free Sensitive Skin Formula  Keri Lotion Fast Absorbing Softly Scented Dry Skin Formula  Keri Original Lotion  Keri Skin Renewal Lotion Keri Silky Smooth Lotion  Keri Silky Smooth Sensitive Skin Lotion  Nivea Body Creamy Conditioning Oil  Nivea Body Extra Enriched Lotion  Nivea Body Original Lotion  Nivea Body Sheer Moisturizing Lotion Nivea Crme  Nivea Skin Firming Lotion  NutraDerm 30 Skin Lotion  NutraDerm Skin Lotion  NutraDerm Therapeutic Skin Cream  NutraDerm Therapeutic Skin Lotion  ProShield Protective Hand Cream  Provon moisturizing lotion    How to Use an Incentive Spirometer  An incentive spirometer is a tool that measures how well you are filling your lungs with each breath. Learning to take long, deep breaths using this tool can help you keep your lungs clear and active. This may help to reverse or lessen your chance of developing breathing (pulmonary) problems, especially infection. You may be asked to use a spirometer: After a surgery. If you have a lung problem or a history of smoking. After a long period of time when you have been unable to move or be active. If the spirometer includes an indicator to show the highest number that you have reached, your  health care provider or respiratory therapist will help you set a goal. Keep a log of your progress as told by your health care provider. What are the risks? Breathing too quickly may cause dizziness or cause you to pass out. Take your time so you do not get dizzy or light-headed. If you are in pain, you may need to take pain medicine before doing incentive spirometry. It is harder to take a deep breath if you are having pain. How to use your incentive spirometer  Sit up on the edge of your bed or  on a chair. Hold the incentive spirometer so that it is in an upright position. Before you use the spirometer, breathe out normally. Place the mouthpiece in your mouth. Make sure your lips are closed tightly around it. Breathe in slowly and as deeply as you can through your mouth, causing the piston or the ball to rise toward the top of the chamber. Hold your breath for 3-5 seconds, or for as long as possible. If the spirometer includes a coach indicator, use this to guide you in breathing. Slow down your breathing if the indicator goes above the marked areas. Remove the mouthpiece from your mouth and breathe out normally. The piston or ball will return to the bottom of the chamber. Rest for a few seconds, then repeat the steps 10 or more times. Take your time and take a few normal breaths between deep breaths so that you do not get dizzy or light-headed. Do this every 1-2 hours when you are awake. If the spirometer includes a goal marker to show the highest number you have reached (best effort), use this as a goal to work toward during each repetition. After each set of 10 deep breaths, cough a few times. This will help to make sure that your lungs are clear. If you have an incision on your chest or abdomen from surgery, place a pillow or a rolled-up towel firmly against the incision when you cough. This can help to reduce pain while taking deep breaths and coughing. General tips When you are able to  get out of bed: Walk around often. Continue to take deep breaths and cough in order to clear your lungs. Keep using the incentive spirometer until your health care provider says it is okay to stop using it. If you have been in the hospital, you may be told to keep using the spirometer at home. Contact a health care provider if: You are having difficulty using the spirometer. You have trouble using the spirometer as often as instructed. Your pain medicine is not giving enough relief for you to use the spirometer as told. You have a fever. Get help right away if: You develop shortness of breath. You develop a cough with bloody mucus from the lungs. You have fluid or blood coming from an incision site after you cough. Summary An incentive spirometer is a tool that can help you learn to take long, deep breaths to keep your lungs clear and active. You may be asked to use a spirometer after a surgery, if you have a lung problem or a history of smoking, or if you have been inactive for a long period of time. Use your incentive spirometer as instructed every 1-2 hours while you are awake. If you have an incision on your chest or abdomen, place a pillow or a rolled-up towel firmly against your incision when you cough. This will help to reduce pain. Get help right away if you have shortness of breath, you cough up bloody mucus, or blood comes from your incision when you cough. This information is not intended to replace advice given to you by your health care provider. Make sure you discuss any questions you have with your health care provider. Document Revised: 10/18/2019 Document Reviewed: 10/18/2019 Elsevier Patient Education  2023 [employer]         Preoperative Educational Videos for Total Hip, Knee and Shoulder Replacements  To better prepare for surgery, please view our videos that explain the physical activity and discharge planning required to  have the best surgical recovery at  [facility].  [email or web address]  Questions? Call [phone] or email [email or web address]

## 2023-10-30 NOTE — Progress Notes (Signed)
  Perioperative Services Pre-Admission/Anesthesia Testing    Date: 10/30/23  Name: Laura Hale MRN:   [id number]  Re: GLP-1 clearance and provider recommendations   Planned Surgical Procedure(s):    Case: [id number] Date/Time: 11/10/23 0700   Procedure: ARTHROPLASTY, HIP, TOTAL,POSTERIOR APPROACH (Right: Hip)   Anesthesia type: Choice   Diagnosis: Primary osteoarthritis of right hip [M16.11]   Pre-op diagnosis: PRIMARY OSTEOARTHRITIS OF RIGHT HIP.   Location: [facility] OR ROOM 01 / [facility]   Surgeons: Donato Heinz, MD      Clinical Notes:  Patient is scheduled for the above procedure with the indicated provider/surgeon. In review of her medication reconciliation it was noted that patient is on a prescribed GLP-1 medication. Per guidelines issued by the American Society of Anesthesiologists (ASA), it is recommended that these medications be held for 7 days prior to the patient undergoing any type of elective surgical procedure. The patient is taking the following GLP-1 medication:  [x]  SEMAGLUTIDE   []  EXENATIDE  []  LIRAGLUTIDE   []  LIXISENATIDE  []  DULAGLUTIDE     []  TIRZEPATIDE (GLP-1/GIP)  Reached out to prescribing provider Graciela Husbands, MD) to make them aware of the guidelines from anesthesia. Given that this patient takes the prescribed GLP-1 medication for her  diabetes diagnosis, rather than for weight loss, recommendations from the prescribing provider were solicited. Prescribing provider made aware of the following so that informed decision/POC can be developed for this patient that may be taking medications belonging to these drug classes:  Oral GLP-1 medications will be held 1 day prior to surgery.  Injectable GLP-1 medications will be held 7 days prior to surgery.  Metformin is routinely held 48 hours prior to surgery due to renal concerns, potential need for contrasted imaging perioperatively, and the potential for tissue hypoxia leading to drug  induced lactic acidosis.  All SGLT2i medications are held 72 hours prior to surgery as they can be associated with the increased potential for developing euglycemic diabetic ketoacidosis (EDKA).   Impression and Plan:  Laura Hale is on a prescribed GLP-1 medication, which induces the known side effect of decreased gastric emptying. Efforts are bring made to mitigate the risk of perioperative hyperglycemic events, as elevated blood glucose levels have been found to contribute to intra/postoperative complications. Additionally, hyperglycemic extremes can potentially necessitate the postponing of a patient's elective case in order to better optimize perioperative glycemic control, again with the aforementioned guidelines in place. With this in mind, recommendations have been sought from the prescribing provider, who has cleared patient to proceed with holding the prescribed GLP-1 as per the guidelines from the ASA.   Provider recommending: no further recommendations received from the prescribing provider.  Copy of signed clearance and recommendations placed on patient's chart for inclusion in their medical record and for review by the surgical/anesthetic team on the day of her procedure.   Quentin Mulling, MSN, APRN, FNP-C, CEN [facility]  Perioperative Services Nurse Practitioner Phone: [phone] Fax: [phone] 10/30/23 8:09 AM  NOTE: This note has been prepared using Dragon dictation software. Despite my best ability to proofread, there is always the potential that unintentional transcriptional errors may still occur from this process.

## 2023-10-31 LAB — URINE CULTURE: Culture: 70000 — AB

## 2023-10-31 NOTE — H&P (Signed)
 ORTHOPAEDIC HISTORY & PHYSICAL Laura Hale, [place] - 10/28/2023 8:30 AM EDT Formatting of this note is different from the original. NAME: Laura Hale H&P Date: 10/28/2023 Procedure Date: 11/10/2023  Chief Complaint: right hip pain  HPI Laura Hale is a 78 y.o. female who has severe Right hip pain. Patient has had increasingly worse right hip pain that has persisted over the last 2-1/2 months. She states the pain localizes to the posterior and anterior groin regions around her hip. She denies having any falls or trauma. She states the pain is made worse with any prolonged weightbearing, ambulation or increased activity level. It has begun to affect her ability to ambulate long distances and perform her ADLs. She has failed conservative treatment including Tylenol, tramadol, prednisone and activity modification. She is currently utilizing a walker to help with her ambulation. She has requested operative intervention for relief of her DJD symptoms. Of note, patient does have a history of extensive lumbar and radicular issues. Patient does have a history of a left total knee arthroplasty that was performed by Dr. Ernest Hale in 2021. Patient denies any cardiac or pulmonary history. She denies any history of DVTs or clots. No previous surgeries on this hip before. She is a diabetic, her last A1c was at 5.6.  Of note, patient states that she has been having increased discomfort and muscle spasms while she tries to sleep, which has been keeping her awake. She is curious if there is anything in the meantime prior to surgery that can help with these underlying issues.  Social Hx: Patient lives at home with her husband. She had denies any smoking, nicotine or tobacco use. No illicit drug use. Does admit to alcohol use 1-2 standard glasses of wine a week  Medications & Allergies Allergies: No Known Allergies  Home Medicines: Current Outpatient Medications on File Prior to Visit Medication  Sig Dispense Refill acetaminophen (TYLENOL) 650 MG ER tablet Take 1,300 mg by mouth every 8 (eight) hours as needed for Pain aspirin 81 MG EC tablet Take 81 mg by mouth once daily atorvastatin (LIPITOR) 80 MG tablet TAKE 1 TABLET (80 MG TOTAL) BY MOUTH ONCE DAILY. 90 tablet 3 cholecalciferol (VITAMIN D3) 1000 unit tablet Take 2,000 Units by mouth Compound Medication Cream of 3 diff meds for rosacea Includes metrocream diphenhydrAMINE-acetaminophen (TYLENOL PM EXTRA STRENGTH) 25-500 mg per tablet Take 1 tablet by mouth nightly as needed fluocinolone (DERMA-SMOOTHE) 0.01 % external oil APPLY TO AFFECTED AREAS ON EARS 1 TO 2 TIMES A DAY AS NEEDED gabapentin (NEURONTIN) 300 MG capsule Take 1 capsule (300 mg total) by mouth at bedtime 30 capsule 3 lactulose (ENULOSE) 10 gram/15 mL oral solution Take 45 mLs by mouth at bedtime 3785 mL 3 loratadine (CLARITIN) 10 mg tablet Take 10 mg by mouth once daily semaglutide (OZEMPIC) 2 mg/dose (8 mg/3 mL) pen injector INJECT 0.75 MLS (2 MG TOTAL) SUBCUTANEOUSLY ONCE A WEEK 3 mL 11  No current facility-administered medications on file prior to visit.  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 Type 2 diabetes mellitus without complication, without long-term current  use of insulin (CMS/HHS-HCC) 02/05/2022 Type 2 diabetes mellitus without complication, without long-term current use of insulin (CMS/HHS-HCC) 02/05/2022 Urinary incontinence,  mixed   Past Surgical History: Procedure Laterality Date OTHER SURGERY Left 2004 arm, left elbow fracture Partial removal of right thumb 2005 COLONOSCOPY 07/03/2004 adenomatous polyp, FH colon cancer 1st degree relative COLONOSCOPY 04/14/2007 normal, PH adenomatous polyp COLONOSCOPY 06/09/2012 hyperplastic polyp, repeat 5 years ([place]) ANTERIOR FUSION CERVICAL SPINE 2016 Dr. Gerlene Fee DISCECTOMY ANTERIOR CERVICLE W/DECOMP 01/2015 ORIF of displaces greater tuberosity fracture, right proximal humerus Right 08/31/2015 Dr.Poggi Extensive arthroscopic debridement with abrasion chondroplasty, excision of loose body, and partial lateral meniscectomy, left knee. Left 01/23/2016 Dr. Joice Lofts CATARACT EXTRACTION 2018 Both eyes COLONOSCOPY 09/23/2017 Negative colon biopsy/FHx CC- Mother/PHx CP/Repeat 34yrs/TKT Left total hip arthroplasty 06/21/2020 Dr Laura Hale Left 5th proxomal phalanx closed reduction & percutaneous pinning (Left) 07/02/2022 Dr Rosita Kea Colon @ [facility] 05/14/2023 Int. Hem/Otherwise normal colon/No Repeat/TKT FRACTURE SURGERY 2017, 2004 HYSTERECTOMY partial hysterectomy KNEE ARTHROSCOPY   Physical Exam  Ht:162.6 cm (5\' 4" ) Wt:(!) 102.2 kg (225 lb 3.2 oz) BMI: Body mass index is 38.66 kg/m.  General/Constitutional: No apparent distress: well-nourished and well developed. Eyes: Pupils equal, round with synchronous movement. Lymphatic: No palpable adenopathy. Respiratory: Patient has good chest rise and fall with inspiration and expiration. All lung fields are clear to auscultation bilaterally. There is no Rales, rhonchi or wheezes appreciated. Cardiovascular: Upon auscultation there is a regular rate and rhythm without any murmurs, rubs, gallops or heaves appreciated. There does not appear to be any swelling down the lower extremities. Posterior tibial pulses appreciated bilaterally, 2+. Integumentary: No impressive skin lesions present, except as noted in detailed  exam. Neuro/Psych: Normal mood and affect, oriented to person, place and time. Musculoskeletal: see exam below  Right hip exam Right Hip:  Upon inspection of the patient's right hip, there does not appear to be any noticeable skin breakdown, erythema or gross deformity.  Pelvic tilt: Negative Limb lengths: Equal with the patient standing Soft tissue swelling: Negative Erythema: Negative Crepitance: Negative Tenderness: Greater trochanter is nontender to palpation. Mild pain is elicited by axial compression or extremes of rotation. Atrophy: No atrophy. Fair to good hip flexor and abductor strength. Range of Motion: EXT/FLEX: 0/90 ADD/ABD: 20/20 IR/ER: 0/25  Patient is neurovascularly intact to all dermatomes extending down there Right lower extremity to all dermatomes. Posterior tibial pulses were appreciated, 2+.  Imaging Hip Imaging: None ordered today. Previous x-rays from 09/17/2023 were reviewed. Upon inspection, there is noticeable hardware in place on the patient's left hip consistent with a left total hip arthroplasty. These implants appear to be well-fixed without any noticeable signs of loosening or osteolytic breakdown. There does appear to be moderate underlying osteoarthritic changes at the right femoral acetabular joint space. Subchondral changes are noted. No fractures, lytic lesions or gross forms appreciated on films.  Assesment and Plan Hip DJD  I have recommended that Doralee Albino Jacquot undergo right total hip replacement. Consents has been signed. The risks, benefits, prognosis and alternatives including but not limited to DVT, PE, infection, neurovascular injury, failure of the procedure and death were explained to the patient and she is willing to proceed with surgery as described to her by myself. Plan will be for post operative admission of at least 1 midnight for pain control and PT. She will be managed with DVT prophylaxis, antibiotics preoperatively for 24  hours and aggressive in patient rehab.  Patient was sent in a short course of tizanidine to help with her underlying  muscle spasms  Pre, intra and post op interventions were discussed. Patient has good understanding  Medication Reconciliation was performed. Discussed cessation of Ozempic, vitamins and supplements.  A total of 45 minutes was spent reviewing patient's charts, medical reconciliation, discussing/educating the patient about surgical interventions, and answering any questions provided by the patient.  JOSHUA Kendrick Fries, PA [facility] 10/28/2023  Electronically signed by Laura Rover, PA at 10/28/2023 1:16 PM EDT

## 2023-10-31 NOTE — Progress Notes (Signed)
  [facility] Perioperative Services: Pre-Admission/Anesthesia Testing  Abnormal Lab Notification   Date: 10/31/23  Name: Laura Hale MRN:   [id number]  Re: Abnormal labs noted during PAT appointment   Notified:  Provider Name Provider Role Notification Mode  Francesco Sor, MD Orthopedics (Surgeon) Routed and/or faxed via [facility]   Abnormal Lab Value(s):   Lab Results  Component Value Date   COLORURINE YELLOW (A) 10/29/2023   APPEARANCEUR HAZY (A) 10/29/2023   LABSPEC 1.025 10/29/2023   PHURINE 5.0 10/29/2023   GLUCOSEU NEGATIVE 10/29/2023   HGBUR NEGATIVE 10/29/2023   BILIRUBINUR NEGATIVE 10/29/2023   KETONESUR NEGATIVE 10/29/2023   PROTEINUR NEGATIVE 10/29/2023   NITRITE NEGATIVE 10/29/2023   LEUKOCYTESUR TRACE (A) 10/29/2023   EPIU 0-5 10/29/2023   WBCU 11-20 10/29/2023   RBCU 0-5 10/29/2023   BACTERIA MANY (A) 10/29/2023   CULT 70,000 COLONIES/mL KLEBSIELLA AEROGENES (A) 10/29/2023    Clinical Information and Notes:  Patient is scheduled for ARTHROPLASTY, HIP, TOTAL,POSTERIOR APPROACH (Right: Hip) on 11/10/2023.    UA performed in PAT consistent with/concerning for infection.  No leukocytosis noted on CBC; WBC 8.6 Renal function: Estimated Creatinine Clearance: 73.2 mL/min (by C-G formula based on SCr of 0.57 mg/dL). Urine C&S added to assess for pathogenically significant growth.  Impression and Plan:  [name] with a UA that was (+) for infection; reflex culture sent. Preliminary culture (+) for significant Klebsiella Aerogenes colony count; final pathogen ID and susceptibilities pending. Will plan on forwarding final culture results to MD as they become available to me. Sending results for review and consideration of preoperative treatment as deemed appropriate by Dr. Ernest Pine.   Quentin Mulling, MSN, APRN, FNP-C, CEN [facility]  Perioperative Services Nurse Practitioner Phone: [phone] Fax: [phone] 10/31/23 9:21 AM  NOTE: This note has been prepared using Dragon dictation software. Despite my best ability to proofread, there is always the potential that unintentional transcriptional errors may still occur from this process.

## 2023-11-09 ENCOUNTER — Encounter: Payer: Self-pay | Admitting: Orthopedic Surgery

## 2023-11-09 MED ORDER — DEXAMETHASONE SODIUM PHOSPHATE 10 MG/ML IJ SOLN
8.0000 mg | Freq: Once | INTRAMUSCULAR | Status: AC
  Administered 2023-11-10: 8 mg via INTRAVENOUS

## 2023-11-09 MED ORDER — CHLORHEXIDINE GLUCONATE 0.12 % MT SOLN
15.0000 mL | Freq: Once | OROMUCOSAL | Status: AC
  Administered 2023-11-10: 15 mL via OROMUCOSAL

## 2023-11-09 MED ORDER — CHLORHEXIDINE GLUCONATE 4 % EX SOLN: 60.0000 mL | Freq: Once | CUTANEOUS | Status: DC

## 2023-11-09 MED ORDER — GABAPENTIN 300 MG PO CAPS
300.0000 mg | ORAL_CAPSULE | Freq: Once | ORAL | Status: AC
  Administered 2023-11-10: 300 mg via ORAL

## 2023-11-09 MED ORDER — CEFAZOLIN SODIUM-DEXTROSE 2-4 GM/100ML-% IV SOLN
2.0000 g | INTRAVENOUS | Status: AC
  Administered 2023-11-10: 2 g via INTRAVENOUS

## 2023-11-09 MED ORDER — TRANEXAMIC ACID-NACL 1000-0.7 MG/100ML-% IV SOLN
1000.0000 mg | INTRAVENOUS | Status: AC
  Administered 2023-11-10: 1000 mg via INTRAVENOUS

## 2023-11-09 MED ORDER — ORAL CARE MOUTH RINSE: 15.0000 mL | Freq: Once | OROMUCOSAL | Status: AC

## 2023-11-09 MED ORDER — SODIUM CHLORIDE 0.9 % IV SOLN: INTRAVENOUS | Status: DC

## 2023-11-09 MED ORDER — CELECOXIB 200 MG PO CAPS
400.0000 mg | ORAL_CAPSULE | Freq: Once | ORAL | Status: AC
  Administered 2023-11-10: 400 mg via ORAL

## 2023-11-10 ENCOUNTER — Encounter: Payer: Self-pay | Admitting: Orthopedic Surgery

## 2023-11-10 ENCOUNTER — Other Ambulatory Visit: Payer: Self-pay

## 2023-11-10 ENCOUNTER — Encounter
Admission: RE | Disposition: A | Discharge status: Home / Self Care | Payer: Self-pay | Source: Ambulatory Visit | Attending: Orthopedic Surgery

## 2023-11-10 ENCOUNTER — Ambulatory Visit: Payer: Self-pay | Admitting: Urgent Care

## 2023-11-10 ENCOUNTER — Observation Stay
Admission: RE | Admit: 2023-11-10 | Discharge: 2023-11-11 | Disposition: A | Discharge status: Home / Self Care | Source: Ambulatory Visit | Attending: Orthopedic Surgery | Admitting: Orthopedic Surgery

## 2023-11-10 ENCOUNTER — Observation Stay

## 2023-11-10 DIAGNOSIS — E119 Type 2 diabetes mellitus without complications: Secondary | ICD-10-CM | POA: Insufficient documentation

## 2023-11-10 DIAGNOSIS — M85851 Other specified disorders of bone density and structure, right thigh: Secondary | ICD-10-CM | POA: Diagnosis not present

## 2023-11-10 DIAGNOSIS — J449 Chronic obstructive pulmonary disease, unspecified: Secondary | ICD-10-CM | POA: Insufficient documentation

## 2023-11-10 DIAGNOSIS — I10 Essential (primary) hypertension: Secondary | ICD-10-CM | POA: Diagnosis not present

## 2023-11-10 DIAGNOSIS — Z96643 Presence of artificial hip joint, bilateral: Secondary | ICD-10-CM | POA: Diagnosis not present

## 2023-11-10 DIAGNOSIS — Z85828 Personal history of other malignant neoplasm of skin: Secondary | ICD-10-CM | POA: Insufficient documentation

## 2023-11-10 DIAGNOSIS — E1159 Type 2 diabetes mellitus with other circulatory complications: Secondary | ICD-10-CM

## 2023-11-10 DIAGNOSIS — M1611 Unilateral primary osteoarthritis, right hip: Secondary | ICD-10-CM | POA: Diagnosis not present

## 2023-11-10 DIAGNOSIS — Z96642 Presence of left artificial hip joint: Secondary | ICD-10-CM | POA: Diagnosis not present

## 2023-11-10 DIAGNOSIS — Z79899 Other long term (current) drug therapy: Secondary | ICD-10-CM | POA: Insufficient documentation

## 2023-11-10 DIAGNOSIS — Z87891 Personal history of nicotine dependence: Secondary | ICD-10-CM | POA: Diagnosis not present

## 2023-11-10 DIAGNOSIS — Z7982 Long term (current) use of aspirin: Secondary | ICD-10-CM | POA: Insufficient documentation

## 2023-11-10 DIAGNOSIS — Z01810 Encounter for preprocedural cardiovascular examination: Secondary | ICD-10-CM

## 2023-11-10 DIAGNOSIS — Z96641 Presence of right artificial hip joint: Secondary | ICD-10-CM

## 2023-11-10 HISTORY — PX: TOTAL HIP ARTHROPLASTY: SHX124

## 2023-11-10 LAB — GLUCOSE, CAPILLARY
Glucose-Capillary: 94 mg/dL (ref 70–99)
Glucose-Capillary: 123 mg/dL — ABNORMAL HIGH (ref 70–99)
Glucose-Capillary: 186 mg/dL — ABNORMAL HIGH (ref 70–99)
Glucose-Capillary: 155 mg/dL — ABNORMAL HIGH (ref 70–99)

## 2023-11-10 SURGERY — ARTHROPLASTY, HIP, TOTAL,POSTERIOR APPROACH
Anesthesia: Spinal | Site: Hip | Laterality: Right

## 2023-11-10 MED ORDER — MAGNESIUM HYDROXIDE 400 MG/5ML PO SUSP
30.0000 mL | Freq: Every day | ORAL | Status: DC
  Administered 2023-11-11: 30 mL via ORAL
  Filled 2023-11-10: qty 30

## 2023-11-10 MED ORDER — FENTANYL CITRATE (PF) 100 MCG/2ML IJ SOLN
INTRAMUSCULAR | Status: AC
  Filled 2023-11-10: qty 2

## 2023-11-10 MED ORDER — INSULIN ASPART 100 UNIT/ML IJ SOLN
0.0000 [IU] | Freq: Three times a day (TID) | INTRAMUSCULAR | Status: DC
  Administered 2023-11-10: 3 [IU] via SUBCUTANEOUS
  Administered 2023-11-11: 2 [IU] via SUBCUTANEOUS
  Filled 2023-11-10 (×2): qty 1

## 2023-11-10 MED ORDER — ONDANSETRON HCL 4 MG/2ML IJ SOLN: 4.0000 mg | Freq: Four times a day (QID) | INTRAMUSCULAR | Status: DC | PRN

## 2023-11-10 MED ORDER — POLYVINYL ALCOHOL 1.4 % OP SOLN: 1.0000 [drp] | OPHTHALMIC | Status: DC | PRN

## 2023-11-10 MED ORDER — ATORVASTATIN CALCIUM 80 MG PO TABS
80.0000 mg | ORAL_TABLET | Freq: Every day | ORAL | Status: DC
  Administered 2023-11-10: 80 mg via ORAL
  Filled 2023-11-10 (×2): qty 1

## 2023-11-10 MED ORDER — LORATADINE 10 MG PO TABS
10.0000 mg | ORAL_TABLET | Freq: Every day | ORAL | Status: DC
  Administered 2023-11-10 – 2023-11-11 (×2): 10 mg via ORAL
  Filled 2023-11-10 (×2): qty 1

## 2023-11-10 MED ORDER — TRAMADOL HCL 50 MG PO TABS
50.0000 mg | ORAL_TABLET | ORAL | Status: DC | PRN
  Administered 2023-11-10: 100 mg via ORAL
  Administered 2023-11-11: 50 mg via ORAL
  Filled 2023-11-10: qty 2
  Filled 2023-11-10: qty 1

## 2023-11-10 MED ORDER — HYDROMORPHONE HCL 1 MG/ML IJ SOLN: 0.5000 mg | INTRAMUSCULAR | Status: DC | PRN

## 2023-11-10 MED ORDER — BUPIVACAINE HCL (PF) 0.5 % IJ SOLN
INTRAMUSCULAR | Status: AC
  Filled 2023-11-10: qty 10

## 2023-11-10 MED ORDER — ACETAMINOPHEN 10 MG/ML IV SOLN
INTRAVENOUS | Status: AC
  Filled 2023-11-10: qty 100

## 2023-11-10 MED ORDER — BUPIVACAINE HCL (PF) 0.5 % IJ SOLN
INTRAMUSCULAR | Status: DC | PRN
  Administered 2023-11-10: 3 mL

## 2023-11-10 MED ORDER — OXYCODONE HCL 5 MG PO TABS: 5.0000 mg | ORAL_TABLET | ORAL | Status: DC | PRN

## 2023-11-10 MED ORDER — SENNOSIDES-DOCUSATE SODIUM 8.6-50 MG PO TABS
1.0000 | ORAL_TABLET | Freq: Two times a day (BID) | ORAL | Status: DC
  Administered 2023-11-10 – 2023-11-11 (×2): 1 via ORAL
  Filled 2023-11-10 (×2): qty 1

## 2023-11-10 MED ORDER — FENTANYL CITRATE (PF) 100 MCG/2ML IJ SOLN
INTRAMUSCULAR | Status: DC | PRN
Start: 2023-11-10 — End: 2023-11-10
  Administered 2023-11-10: 25 ug via INTRAVENOUS

## 2023-11-10 MED ORDER — GABAPENTIN 300 MG PO CAPS
ORAL_CAPSULE | ORAL | Status: AC
Start: 2023-11-10 — End: ?
  Filled 2023-11-10: qty 1

## 2023-11-10 MED ORDER — BUPIVACAINE LIPOSOME 1.3 % IJ SUSP
INTRAMUSCULAR | Status: AC
  Filled 2023-11-10: qty 20

## 2023-11-10 MED ORDER — GABAPENTIN 100 MG PO CAPS
300.0000 mg | ORAL_CAPSULE | Freq: Every day | ORAL | Status: DC
Start: 2023-11-11 — End: 2023-11-11

## 2023-11-10 MED ORDER — SODIUM CHLORIDE (PF) 0.9 % IJ SOLN
INTRAMUSCULAR | Status: AC
  Filled 2023-11-10: qty 40

## 2023-11-10 MED ORDER — CELECOXIB 200 MG PO CAPS
200.0000 mg | ORAL_CAPSULE | Freq: Two times a day (BID) | ORAL | Status: DC
  Administered 2023-11-10 – 2023-11-11 (×2): 200 mg via ORAL
  Filled 2023-11-10 (×2): qty 1

## 2023-11-10 MED ORDER — PROPOFOL 1000 MG/100ML IV EMUL
INTRAVENOUS | Status: AC
  Filled 2023-11-10: qty 100

## 2023-11-10 MED ORDER — DROPERIDOL 2.5 MG/ML IJ SOLN: 0.6250 mg | Freq: Once | INTRAMUSCULAR | Status: DC | PRN

## 2023-11-10 MED ORDER — LACTULOSE 10 GM/15ML PO SOLN
20.0000 g | Freq: Every day | ORAL | Status: DC
  Administered 2023-11-10: 20 g via ORAL
  Filled 2023-11-10: qty 30

## 2023-11-10 MED ORDER — CELECOXIB 200 MG PO CAPS
ORAL_CAPSULE | ORAL | Status: AC
  Filled 2023-11-10: qty 2

## 2023-11-10 MED ORDER — CEFAZOLIN SODIUM-DEXTROSE 2-4 GM/100ML-% IV SOLN
2.0000 g | Freq: Four times a day (QID) | INTRAVENOUS | Status: AC
  Administered 2023-11-10 (×2): 2 g via INTRAVENOUS
  Filled 2023-11-10 (×2): qty 100

## 2023-11-10 MED ORDER — FENTANYL CITRATE (PF) 100 MCG/2ML IJ SOLN
25.0000 ug | INTRAMUSCULAR | Status: DC | PRN
  Administered 2023-11-10 (×4): 25 ug via INTRAVENOUS

## 2023-11-10 MED ORDER — OXYCODONE HCL 5 MG PO TABS: 10.0000 mg | ORAL_TABLET | ORAL | Status: DC | PRN

## 2023-11-10 MED ORDER — ONDANSETRON HCL 4 MG/2ML IJ SOLN
INTRAMUSCULAR | Status: DC | PRN
  Administered 2023-11-10: 4 mg via INTRAVENOUS

## 2023-11-10 MED ORDER — ONDANSETRON HCL 4 MG/2ML IJ SOLN
INTRAMUSCULAR | Status: AC
  Filled 2023-11-10: qty 2

## 2023-11-10 MED ORDER — ACETAMINOPHEN 325 MG PO TABS: 325.0000 mg | ORAL_TABLET | Freq: Four times a day (QID) | ORAL | Status: DC | PRN

## 2023-11-10 MED ORDER — TRANEXAMIC ACID-NACL 1000-0.7 MG/100ML-% IV SOLN
INTRAVENOUS | Status: AC
  Filled 2023-11-10: qty 100

## 2023-11-10 MED ORDER — SURGIRINSE WOUND IRRIGATION SYSTEM - OPTIME
TOPICAL | Status: DC | PRN
  Administered 2023-11-10: 450 mL via TOPICAL

## 2023-11-10 MED ORDER — DIPHENHYDRAMINE HCL 12.5 MG/5ML PO ELIX: 12.5000 mg | ORAL_SOLUTION | ORAL | Status: DC | PRN

## 2023-11-10 MED ORDER — PHENYLEPHRINE HCL-NACL 20-0.9 MG/250ML-% IV SOLN
INTRAVENOUS | Status: AC
Start: 2023-11-10 — End: ?
  Filled 2023-11-10: qty 500

## 2023-11-10 MED ORDER — CEFAZOLIN SODIUM-DEXTROSE 2-4 GM/100ML-% IV SOLN
INTRAVENOUS | Status: AC
  Filled 2023-11-10: qty 100

## 2023-11-10 MED ORDER — FLEET ENEMA RE ENEM: 1.0000 | ENEMA | Freq: Once | RECTAL | Status: DC | PRN

## 2023-11-10 MED ORDER — PHENOL 1.4 % MT LIQD: 1.0000 | OROMUCOSAL | Status: DC | PRN

## 2023-11-10 MED ORDER — ACETAMINOPHEN 10 MG/ML IV SOLN: 1000.0000 mg | Freq: Once | INTRAVENOUS | Status: DC | PRN

## 2023-11-10 MED ORDER — ASPIRIN 81 MG PO CHEW
81.0000 mg | CHEWABLE_TABLET | Freq: Two times a day (BID) | ORAL | Status: DC
Start: 2023-11-10 — End: 2023-11-11
  Administered 2023-11-10 – 2023-11-11 (×3): 81 mg via ORAL
  Filled 2023-11-10 (×3): qty 1

## 2023-11-10 MED ORDER — SEVOFLURANE IN SOLN
RESPIRATORY_TRACT | Status: AC
  Filled 2023-11-10: qty 250

## 2023-11-10 MED ORDER — 0.9 % SODIUM CHLORIDE (POUR BTL) OPTIME
TOPICAL | Status: DC | PRN
  Administered 2023-11-10: 1000 mL

## 2023-11-10 MED ORDER — BUPIVACAINE HCL (PF) 0.25 % IJ SOLN
INTRAMUSCULAR | Status: AC
  Filled 2023-11-10: qty 60

## 2023-11-10 MED ORDER — ACETAMINOPHEN 10 MG/ML IV SOLN
INTRAVENOUS | Status: DC | PRN
Start: 2023-11-10 — End: 2023-11-10
  Administered 2023-11-10: 1000 mg via INTRAVENOUS

## 2023-11-10 MED ORDER — PHENYLEPHRINE HCL-NACL 20-0.9 MG/250ML-% IV SOLN
INTRAVENOUS | Status: DC | PRN
  Administered 2023-11-10: 40 ug/min via INTRAVENOUS

## 2023-11-10 MED ORDER — PROPOFOL 500 MG/50ML IV EMUL
INTRAVENOUS | Status: DC | PRN
  Administered 2023-11-10: 100 ug/kg/min via INTRAVENOUS

## 2023-11-10 MED ORDER — ACETAMINOPHEN 10 MG/ML IV SOLN
1000.0000 mg | Freq: Four times a day (QID) | INTRAVENOUS | Status: DC
Start: 2023-11-10 — End: 2023-11-11
  Administered 2023-11-10 – 2023-11-11 (×3): 1000 mg via INTRAVENOUS
  Filled 2023-11-10 (×3): qty 100

## 2023-11-10 MED ORDER — LIDOCAINE HCL (CARDIAC) PF 50 MG/5ML IV SOSY: PREFILLED_SYRINGE | INTRAVENOUS | Status: DC | PRN

## 2023-11-10 MED ORDER — BISACODYL 10 MG RE SUPP: 10.0000 mg | Freq: Every day | RECTAL | Status: DC | PRN

## 2023-11-10 MED ORDER — OXYCODONE HCL 5 MG PO TABS
ORAL_TABLET | ORAL | Status: AC
  Filled 2023-11-10: qty 1

## 2023-11-10 MED ORDER — DEXAMETHASONE SODIUM PHOSPHATE 10 MG/ML IJ SOLN
INTRAMUSCULAR | Status: AC
  Filled 2023-11-10: qty 1

## 2023-11-10 MED ORDER — SODIUM CHLORIDE 0.9 % IV SOLN: INTRAVENOUS | Status: DC

## 2023-11-10 MED ORDER — LIDOCAINE HCL (PF) 2 % IJ SOLN
INTRAMUSCULAR | Status: AC
  Filled 2023-11-10: qty 5

## 2023-11-10 MED ORDER — PANTOPRAZOLE SODIUM 40 MG PO TBEC
40.0000 mg | DELAYED_RELEASE_TABLET | Freq: Two times a day (BID) | ORAL | Status: DC
  Administered 2023-11-10 – 2023-11-11 (×2): 40 mg via ORAL
  Filled 2023-11-10 (×2): qty 1

## 2023-11-10 MED ORDER — OXYCODONE HCL 5 MG PO TABS
5.0000 mg | ORAL_TABLET | Freq: Once | ORAL | Status: AC | PRN
  Administered 2023-11-10: 5 mg via ORAL

## 2023-11-10 MED ORDER — MIDAZOLAM HCL 2 MG/2ML IJ SOLN
INTRAMUSCULAR | Status: AC
  Filled 2023-11-10: qty 2

## 2023-11-10 MED ORDER — METOCLOPRAMIDE HCL 10 MG PO TABS
10.0000 mg | ORAL_TABLET | Freq: Three times a day (TID) | ORAL | Status: DC
Start: 2023-11-10 — End: 2023-11-12
  Administered 2023-11-10 – 2023-11-11 (×2): 10 mg via ORAL
  Filled 2023-11-10 (×2): qty 1

## 2023-11-10 MED ORDER — IVERMECTIN 1 % EX CREA: 1.0000 | TOPICAL_CREAM | Freq: Every day | CUTANEOUS | Status: DC

## 2023-11-10 MED ORDER — MIDAZOLAM HCL 5 MG/5ML IJ SOLN
INTRAMUSCULAR | Status: DC | PRN
  Administered 2023-11-10: 1 mg via INTRAVENOUS

## 2023-11-10 MED ORDER — MENTHOL 3 MG MT LOZG: 1.0000 | LOZENGE | OROMUCOSAL | Status: DC | PRN

## 2023-11-10 MED ORDER — TRANEXAMIC ACID-NACL 1000-0.7 MG/100ML-% IV SOLN
1000.0000 mg | Freq: Once | INTRAVENOUS | Status: AC
  Administered 2023-11-10: 1000 mg via INTRAVENOUS

## 2023-11-10 MED ORDER — CHLORHEXIDINE GLUCONATE 0.12 % MT SOLN
OROMUCOSAL | Status: AC
  Filled 2023-11-10: qty 15

## 2023-11-10 MED ORDER — PROPOFOL 10 MG/ML IV BOLUS
INTRAVENOUS | Status: AC
  Filled 2023-11-10: qty 20

## 2023-11-10 MED ORDER — ONDANSETRON HCL 4 MG PO TABS: 4.0000 mg | ORAL_TABLET | Freq: Four times a day (QID) | ORAL | Status: DC | PRN

## 2023-11-10 MED ORDER — OXYCODONE HCL 5 MG/5ML PO SOLN: 5.0000 mg | Freq: Once | ORAL | Status: AC | PRN

## 2023-11-10 MED ORDER — TIZANIDINE HCL 2 MG PO TABS
2.0000 mg | ORAL_TABLET | Freq: Every day | ORAL | Status: DC
  Filled 2023-11-10 (×2): qty 1

## 2023-11-10 MED ORDER — INSULIN ASPART 100 UNIT/ML IJ SOLN: 0.0000 [IU] | Freq: Every day | INTRAMUSCULAR | Status: DC

## 2023-11-10 MED ORDER — FERROUS SULFATE 325 (65 FE) MG PO TABS
325.0000 mg | ORAL_TABLET | Freq: Two times a day (BID) | ORAL | Status: DC
Start: 2023-11-10 — End: 2023-11-11
  Administered 2023-11-11: 325 mg via ORAL
  Filled 2023-11-10: qty 1

## 2023-11-10 MED ORDER — SODIUM CHLORIDE 0.9 % IR SOLN
Status: DC | PRN
  Administered 2023-11-10: 3000 mL

## 2023-11-10 MED ORDER — ALUM & MAG HYDROXIDE-SIMETH 200-200-20 MG/5ML PO SUSP: 30.0000 mL | ORAL | Status: DC | PRN

## 2023-11-10 SURGICAL SUPPLY — 53 items
BLADE CLIPPER SURG (BLADE) IMPLANT
BLADE SAW 90X25X1.19 OSCILLAT (BLADE) ×1 IMPLANT
BRUSH SCRUB EZ PLAIN DRY (MISCELLANEOUS) ×1 IMPLANT
CUP ACETBLR 52 OD 100 SERIES (Hips) IMPLANT
DRAPE INCISE IOBAN 66X60 STRL (DRAPES) ×1 IMPLANT
DRAPE SHEET LG 3/4 BI-LAMINATE (DRAPES) ×1 IMPLANT
DRSG AQUACEL AG ADV 3.5X14 (GAUZE/BANDAGES/DRESSINGS) ×1 IMPLANT
DRSG MEPILEX SACRM 8.7X9.8 (GAUZE/BANDAGES/DRESSINGS) ×1 IMPLANT
DRSG TEGADERM 4X4.75 (GAUZE/BANDAGES/DRESSINGS) ×1 IMPLANT
DRSG XEROFORM 1X8 (GAUZE/BANDAGES/DRESSINGS) IMPLANT
DURAPREP 26ML APPLICATOR (WOUND CARE) ×2 IMPLANT
ELECT CAUTERY BLADE 6.4 (BLADE) ×1 IMPLANT
ELECT REM PT RETURN 9FT ADLT (ELECTROSURGICAL) ×1 IMPLANT
ELECTRODE REM PT RTRN 9FT ADLT (ELECTROSURGICAL) ×1 IMPLANT
EVACUATOR 1/8 PVC DRAIN (DRAIN) ×1 IMPLANT
GAUZE XEROFORM 1X8 LF (GAUZE/BANDAGES/DRESSINGS) ×1 IMPLANT
GLOVE BIOGEL M STRL SZ7.5 (GLOVE) ×6 IMPLANT
GLOVE SURG UNDER POLY LF SZ8 (GLOVE) ×2 IMPLANT
GOWN STRL REUS W/ TWL LRG LVL3 (GOWN DISPOSABLE) ×2 IMPLANT
GOWN STRL REUS W/ TWL XL LVL3 (GOWN DISPOSABLE) ×1 IMPLANT
GOWN TOGA ZIPPER T7+ PEEL AWAY (MISCELLANEOUS) ×1 IMPLANT
HANDLE YANKAUER SUCT OPEN TIP (MISCELLANEOUS) ×1 IMPLANT
HEAD M SROM 36MM 2 (Hips) IMPLANT
HOLDER FOLEY CATH W/STRAP (MISCELLANEOUS) ×1 IMPLANT
HOOD PEEL AWAY T7 (MISCELLANEOUS) ×1 IMPLANT
KIT PEG BOARD PINK (KITS) ×1 IMPLANT
KIT TURNOVER KIT A (KITS) ×1 IMPLANT
LINER NEUTRAL 52X36MM PLUS 4 (Liner) IMPLANT
MANIFOLD NEPTUNE II (INSTRUMENTS) ×2 IMPLANT
NS IRRIG 500ML POUR BTL (IV SOLUTION) ×1 IMPLANT
PACK HIP PROSTHESIS (MISCELLANEOUS) ×1 IMPLANT
PENCIL SMOKE EVACUATOR COATED (MISCELLANEOUS) ×1 IMPLANT
PIN SECTOR W/GRIP ACE CUP 52MM (Hips) IMPLANT
PIN STEIN THRED 5/32 (Pin) ×1 IMPLANT
PULSAVAC PLUS IRRIG FAN TIP (DISPOSABLE) ×1 IMPLANT
SCREW 6.5MMX25MM (Screw) IMPLANT
SCREW PINN CAN 6.5X20 (Screw) IMPLANT
SOL .9 NS 3000ML IRR UROMATIC (IV SOLUTION) ×1 IMPLANT
SOLUTION IRRIG SURGIPHOR (IV SOLUTION) ×1 IMPLANT
SPONGE DRAIN TRACH 4X4 STRL 2S (GAUZE/BANDAGES/DRESSINGS) ×1 IMPLANT
SROM M HEAD 36MM 2 (Hips) ×1 IMPLANT
STAPLER SKIN PROX 35W (STAPLE) ×1 IMPLANT
STEM FEM ACTIS STD SZ7 (Nail) IMPLANT
SUT ETHIBOND #5 BRAIDED 30INL (SUTURE) ×1 IMPLANT
SUT VIC AB 0 CT1 36 (SUTURE) ×2 IMPLANT
SUT VIC AB 1 CT1 36 (SUTURE) ×2 IMPLANT
SUT VIC AB 2-0 CT1 TAPERPNT 27 (SUTURE) ×1 IMPLANT
TAPE CLOTH 3X10 WHT NS LF (GAUZE/BANDAGES/DRESSINGS) ×1 IMPLANT
TIP FAN IRRIG PULSAVAC PLUS (DISPOSABLE) ×1 IMPLANT
TOWEL OR 17X26 4PK STRL BLUE (TOWEL DISPOSABLE) IMPLANT
TRAP FLUID SMOKE EVACUATOR (MISCELLANEOUS) ×1 IMPLANT
TRAY FOLEY MTR SLVR 16FR STAT (SET/KITS/TRAYS/PACK) ×1 IMPLANT
WATER STERILE IRR 1000ML POUR (IV SOLUTION) ×1 IMPLANT

## 2023-11-10 NOTE — Op Note (Signed)
 OPERATIVE NOTE  DATE OF SURGERY:  11/10/2023  PATIENT NAME:  Laura Hale   DOB: [birth year]  MRN: [id number]  PRE-OPERATIVE DIAGNOSIS: Degenerative arthrosis of the right hip, primary  POST-OPERATIVE DIAGNOSIS:  Same  PROCEDURE:  Right total hip arthroplasty  SURGEON:  Jena Gauss. M.D.  ASSISTANT:  Gean Birchwood, PA-C (present and scrubbed throughout the case, critical for assistance with exposure, retraction, instrumentation, and closure)  ANESTHESIA: spinal  ESTIMATED BLOOD LOSS: 150 mL  FLUIDS REPLACED: 700 mL of crystalloid  DRAINS: 2 medium Hemovac drains  IMPLANTS UTILIZED: DePuy size 7 standard offset Actis femoral stem, 52 mm OD Pinnacle GRIPTION Sector acetabular component, two 6.5 mm cancellous screws, +4 mm neutral Pinnacle Altrx polyethylene insert, and a 36 mm M-SPEC -2 mm hip ball  INDICATIONS FOR SURGERY: Laura Hale is a 78 y.o. year old female with a long history of progressive hip and groin  pain. X-rays demonstrated severe degenerative changes. The patient had not seen any significant improvement despite conservative nonsurgical intervention. After discussion of the risks and benefits of surgical intervention, the patient expressed understanding of the risks benefits and agree with plans for total hip arthroplasty.   The risks, benefits, and alternatives were discussed at length including but not limited to the risks of infection, bleeding, nerve injury, stiffness, blood clots, the need for revision surgery, limb length inequality, dislocation, cardiopulmonary complications, among others, and they were willing to proceed.  PROCEDURE IN DETAIL: The patient was brought into the operating room and, after adequate spinal anesthesia was achieved, the patient was placed in a left lateral decubitus position. Axillary roll was placed and all bony prominences were well-padded. The patient's right hip was cleaned and prepped with alcohol and DuraPrep and  draped in the usual sterile fashion. A "timeout" was performed as per usual protocol. A lateral curvilinear incision was made gently curving towards the posterior superior iliac spine. The IT band was incised in line with the skin incision and the fibers of the gluteus maximus were split in line. The piriformis tendon was identified, skeletonized, and incised at its insertion to the proximal femur and reflected posteriorly. A T type posterior capsulotomy was performed. Prior to dislocation of the femoral head, a threaded Steinmann pin was inserted through a separate stab incision into the pelvis superior to the acetabulum and bent in the form of a stylus so as to assess limb length and hip offset throughout the procedure. The femoral head was then dislocated posteriorly. Inspection of the femoral head demonstrated severe degenerative changes with full-thickness loss of articular cartilage. The femoral neck cut was performed using an oscillating saw. The anterior capsule was elevated off of the femoral neck using a periosteal elevator. Attention was then directed to the acetabulum. The remnant of the labrum was excised using electrocautery. Inspection of the acetabulum also demonstrated significant degenerative changes. The acetabulum was reamed in sequential fashion up to a 51 mm diameter. Good punctate bleeding bone was encountered. A 52 mm Pinnacle 100 acetabular component was positioned and impacted into place. Good scratch fit was appreciated. A 4 mm neutral polyethylene trial was inserted.  Attention was then directed to the proximal femur.  Femoral broaches were inserted in a sequential fashion up to a size 7 broach. Calcar region was planed and a trial reduction was performed using a standard offset neck and a 36 mm hip ball with a -2 mm neck length.  As the trials were being dislocated, the neck segment impinges upon  the rim of the acetabular component causing gross movement.  The 52 mm Pinnacle 100 cup  was removed.  It was elected to exchange the cup for a 52 mm Pinnacle GRIPTION Sector acetabular component.  The component was positioned and impacted in place.  Good scratch fit was achieved.  It was elected to provide additional stability using two 6.5 mm cancellous screws through the dome holes.  A +4 mm neutral polyethylene trial was inserted and the hip was again trialed with the 36 mm hip ball with a -2 mm neck length.  Good equalization of limb lengths and hip offset was appreciated and excellent stability was noted both anteriorly and posteriorly. Trial components were removed. The acetabular shell was irrigated with copious amounts of normal saline with antibiotic solution and suctioned dry. A +4 mm neutral Pinnacle Altrx polyethylene insert was positioned and impacted into place. Next, a size 7 standard offset Actis femoral stem was positioned and impacted into place. Excellent scratch fit was appreciated. A trial reduction was again performed with a 36 mm hip ball with a -2 mm neck length. Again, good equalization of limb lengths was appreciated and excellent stability appreciated both anteriorly and posteriorly. The hip was then dislocated and the trial hip ball was removed. The Morse taper was cleaned and dried. A 36 mm M-SPEC hip ball with a -2 mm neck length was placed on the trunnion and impacted into place. The hip was then reduced and placed through range of motion. Excellent stability was appreciated both anteriorly and posteriorly.  The wound was irrigated with copious amounts of normal saline followed by 450 ml of Surgiphor and suctioned dry. Good hemostasis was appreciated. The posterior capsulotomy was repaired using #5 Ethibond. Piriformis tendon was reapproximated to the undersurface of the gluteus medius tendon using #5 Ethibond. The IT band was reapproximated using interrupted sutures of #1 Vicryl. Subcutaneous tissue was approximated using first #0 Vicryl followed by #2-0 Vicryl. The  skin was closed with skin staples.  The patient tolerated the procedure well and was transported to the recovery room in stable condition.   Jena Gauss., M.D.

## 2023-11-10 NOTE — Plan of Care (Signed)

## 2023-11-10 NOTE — Transfer of Care (Cosign Needed)
 Immediate Anesthesia Transfer of Care Note  Patient: Laura Hale  Procedure(s) Performed: ARTHROPLASTY, HIP, TOTAL,POSTERIOR APPROACH (Right: Hip)  Patient Location: PACU  Anesthesia Type:Spinal  Level of Consciousness: drowsy and patient cooperative  Airway & Oxygen Therapy: Patient Spontanous Breathing and Patient connected to face mask oxygen  Post-op Assessment: Report given to RN and Post -op Vital signs reviewed and stable  Post vital signs: Reviewed and stable  Last Vitals:  Vitals Value Taken Time  BP 123/72 11/10/23 1102  Temp 36.5 C 11/10/23 1102  Pulse 90 11/10/23 1104  Resp 18 11/10/23 1104  SpO2 100 % 11/10/23 1104  Vitals shown include unfiled device data.  Last Pain:  Vitals:   11/10/23 0645  TempSrc: Temporal  PainSc: 6          Complications: No notable events documented.

## 2023-11-10 NOTE — Anesthesia Preprocedure Evaluation (Signed)
 Anesthesia Evaluation  Patient identified by MRN, date of birth, ID band Patient awake    Reviewed: Allergy & Precautions, H&P , NPO status , Patient's Chart, lab work & pertinent test results, reviewed documented beta blocker date and time   History of Anesthesia Complications (+) DIFFICULT AIRWAY and history of anesthetic complications  Airway Mallampati: III   Neck ROM: full    Dental  (+) Poor Dentition   Pulmonary asthma , sleep apnea , COPD,  COPD inhaler, former smoker   Pulmonary exam normal        Cardiovascular Exercise Tolerance: Poor hypertension, On Medications (-) angina (-) CHF, (-) Orthopnea and (-) DVT negative cardio ROS Normal cardiovascular exam Rhythm:regular Rate:Normal     Neuro/Psych  PSYCHIATRIC DISORDERS       Neuromuscular disease    GI/Hepatic negative GI ROS, Neg liver ROS,,,  Endo/Other  negative endocrine ROSdiabetes, Well Controlled    Renal/GU negative Renal ROS  negative genitourinary   Musculoskeletal   Abdominal   Peds  Hematology negative hematology ROS (+)   Anesthesia Other Findings Past Medical History: No date: Arthritis     Comment:  elbow, hands, neck  No date: Asthma     Comment:  allergy induced asthma-only exacerbated by smells like               perfume, smoke etc 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     Comment:  myoview -negative for ischemia with preserved LV               function 04/02/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  couldn't talk due to tube No date: Edema     Comment:  LEGS/FEET 01/31/2015: Herniated cervical disc 2009: Hypercalcemia     Comment:  calcium  WNL 10.2 on 04-2015 06/18/2020: Hyperglycemia No date: Hyperlipidemia No date: Hypertension No date: Joint stiffness of hand, left     Comment:  cannot lay hand flat S/P elbow surgery 04/13/2014: Neuropathy No date: Obesity No date: Pre-diabetes No date: Primary osteoarthritis of left hip 01/19/2016: Primary osteoarthritis of left knee 11/20/2015: Primary osteoarthritis of right knee 06/07/2020: Pulmonary nodules 11/20/2015: Rotator cuff tendinitis, left No date: Sleep apnea     Comment:  No CPAP No date: Urinary incontinence 10/15/2013: Vitamin D deficiency Past Surgical History: No date: ABDOMINAL HYSTERECTOMY     Comment:  one ovary remains 01/31/2015: ANTERIOR CERVICAL DECOMP/DISCECTOMY FUSION; N/A     Comment:  Procedure: ANTERIOR CERVICAL DECOMPRESSION/DISCECTOMY               FUSION CERVICAL FIVE SIX;  Surgeon: Aliene Beams, MD;                Location: [facility];  Service: Neurosurgery;                Laterality: N/A; No date: BACK SURGERY  Comment:  C 6-C7 neck  02/06/2017: CATARACT EXTRACTION W/PHACO; Left     Comment:  Procedure: CATARACT EXTRACTION PHACO AND INTRAOCULAR               LENS PLACEMENT (IOC);  Surgeon: Nevada Crane, MD;                Location: [facility];  Service: Ophthalmology;  Laterality:              Left;  Lot# [id number] H [place]: 00:37.9 AP%: 7.9 CDE: 2.97 03/06/2017: CATARACT EXTRACTION W/PHACO; Right     Comment:  Procedure: CATARACT EXTRACTION PHACO AND INTRAOCULAR               LENS PLACEMENT (IOC);  Surgeon: Nevada Crane, MD;                Location: [facility];  Service: Ophthalmology;  Laterality:              Right;  Lot #[id number] H [place]:  00.22.0 AP%: 7.5 CDE: 1.64  07/02/2022: CLOSED REDUCTION METACARPAL WITH PERCUTANEOUS PINNING;  Left     Comment:  Procedure: Left 5th proxomal phalanx closed reduction &               percutaneous pinning;  Surgeon: Kennedy Bucker, MD;                Location: [facility];  Service: Orthopedics;                Laterality: Left; No date: COLONOSCOPY 09/23/2017: COLONOSCOPY WITH PROPOFOL; N/A     Comment:  Procedure: COLONOSCOPY WITH PROPOFOL;  Surgeon: [place],               Boykin Nearing, MD;  Location: [facility];  Service:               Gastroenterology;  Laterality: N/A; 05/14/2023: COLONOSCOPY WITH PROPOFOL; N/A     Comment:  Procedure: COLONOSCOPY WITH PROPOFOL;  Surgeon: [place],               Boykin Nearing, MD;  Location: [facility];  Service:               Gastroenterology;  Laterality: N/A; 04/2003: ELBOW SURGERY; Left     Comment:  x2  & for repair & then remove all hardware, due to               injury from a fall No date: EYE SURGERY; Bilateral     Comment:  cataracts No date: FRACTURE SURGERY; Right     Comment:  shoulder and arm 2003: FRACTURE SURGERY; Left     Comment:  elbow 2006: HAND SURGERY; Right     Comment:  injury- then eventually lost remainder of thumb 01/23/2016: KNEE ARTHROSCOPY WITH LATERAL MENISECTOMY; Left     Comment:  Procedure: KNEE ARTHROSCOPY WITH LATERAL MENISECTOMY;                Surgeon: Christena Flake, MD;  Location: [facility];  Service:              Orthopedics;  Laterality: Left; 08/31/2015: ORIF HUMERUS FRACTURE; Right     Comment:  Procedure: OPEN REDUCTION INTERNAL FIXATION (ORIF)               PROXIMAL HUMERUS FRACTURE w/ #2 fberwire;  Surgeon: Christena Flake, MD;  Location: [facility]  [facility];  Service: Orthopedics;               Laterality: Right; 2005: THUMB AMPUTATION     Comment:  partial 06/21/2020: TOTAL HIP ARTHROPLASTY; Left     Comment:  Procedure: TOTAL HIP ARTHROPLASTY;  Surgeon: Donato Heinz, MD;   Location: [facility];  Service: Orthopedics;               Laterality: Left; BMI    Body Mass Index: 37.20 kg/m     Reproductive/Obstetrics negative OB ROS                             Anesthesia Physical Anesthesia Plan  ASA: 3  Anesthesia Plan: Spinal   Post-op Pain Management:    Induction:   PONV Risk Score and Plan:   Airway Management Planned:   Additional Equipment:   Intra-op Plan:   Post-operative Plan:   Informed Consent: I have reviewed the patients History and Physical, chart, labs and discussed the procedure including the risks, benefits and alternatives for the proposed anesthesia with the patient or authorized representative who has indicated his/her understanding and acceptance.     Dental Advisory Given  Plan Discussed with: CRNA  Anesthesia Plan Comments:        Anesthesia Quick Evaluation

## 2023-11-10 NOTE — Progress Notes (Signed)
 Patient is not able to walk the distance required to go the bathroom, or he/she is unable to safely negotiate stairs required to access the bathroom.  A 3in1 BSC will alleviate this problem   Amenda Duclos P. Angie Fava M.D.

## 2023-11-10 NOTE — Evaluation (Addendum)
 Physical Therapy Evaluation Patient Details Name: Laura Hale MRN: [id number] DOB: 1946/02/29 Today's Date: 11/10/2023  History of Present Illness  Pt admitted for R THR. HIstory includes HLD, COPD, DM, and previous L THR. Pt is POD 0 at time of evaluation.  Clinical Impression  Pt is a pleasant 78 year old female who was admitted for R THR. Pt performs bed mobility with cga, transfers with supervision, and ambulation with supervision and RW. Pt demonstrates deficits with strength/mobility. Would benefit from skilled PT to address above deficits and promote optimal return to PLOF. Heavy education given on hip precautions and sleeping positions. Pt will continue to receive skilled PT services while admitted and will defer to TOC/care team for updates regarding disposition planning.       If plan is discharge home, recommend the following: A little help with walking and/or transfers;Help with stairs or ramp for entrance   Can travel by private vehicle        Equipment Recommendations None recommended by PT  Recommendations for Other Services       Functional Status Assessment Patient has had a recent decline in their functional status and demonstrates the ability to make significant improvements in function in a reasonable and predictable amount of time.     Precautions / Restrictions Precautions Precautions: Fall;Posterior Hip Precaution Booklet Issued: No Restrictions Weight Bearing Restrictions Per Provider Order: Yes RLE Weight Bearing Per Provider Order: Weight bearing as tolerated      Mobility  Bed Mobility Overal bed mobility: Needs Assistance Bed Mobility: Supine to Sit, Sit to Supine     Supine to sit: Contact guard Sit to supine: Contact guard assist   General bed mobility comments: needs assist for B LE adjustment and positioning in bed.    Transfers Overall transfer level: Needs assistance Equipment used: Rolling walker (2 wheels) Transfers: Sit  to/from Stand Sit to Stand: Supervision           General transfer comment: cues for sequencing and maintaining hip precautions once in standing. RW used    Ambulation/Gait Ambulation/Gait assistance: Supervision Gait Distance (Feet): 120 Feet Assistive device: Rolling walker (2 wheels) Gait Pattern/deviations: Step-through pattern       General Gait Details: ambulated in hallway with reciprocal gait pattern. RW adjusted to correct height and cues for posture.  Stairs            Wheelchair Mobility     Tilt Bed    Modified Rankin (Stroke Patients Only)       Balance Overall balance assessment: Needs assistance Sitting-balance support: Feet supported Sitting balance-Leahy Scale: Good     Standing balance support: Bilateral upper extremity supported Standing balance-Leahy Scale: Good                               Pertinent Vitals/Pain Pain Assessment Pain Assessment: No/denies pain    Home Living Family/patient expects to be discharged to:: Private residence Living Arrangements: Spouse/significant other Available Help at Discharge: Family Type of Home: House           Home Equipment: Agricultural consultant (2 wheels);BSC/3in1 Additional Comments: forgot to ask home set up    Prior Function Prior Level of Function : Independent/Modified Independent             Mobility Comments: indep ADLs Comments: indep     Extremity/Trunk Assessment   Upper Extremity Assessment Upper Extremity Assessment: Overall WFL for tasks assessed  Lower Extremity Assessment Lower Extremity Assessment: Generalized weakness (R LE grossly 4/5)       Communication   Communication Communication: No apparent difficulties    Cognition Arousal: Alert Behavior During Therapy: WFL for tasks assessed/performed   PT - Cognitive impairments: No apparent impairments                       PT - Cognition Comments: very pleasant and agreeable to  session Following commands: Intact       Cueing Cueing Techniques: Verbal cues     General Comments      Exercises     Assessment/Plan    PT Assessment Patient needs continued PT services  PT Problem List Decreased strength;Decreased balance;Decreased mobility;Pain;Decreased knowledge of use of DME       PT Treatment Interventions DME instruction;Therapeutic exercise;Balance training;Gait training    PT Goals (Current goals can be found in the Care Plan section)  Acute Rehab PT Goals Patient Stated Goal: to go home PT Goal Formulation: With patient Time For Goal Achievement: 11/24/23 Potential to Achieve Goals: Good    Frequency BID     Co-evaluation               AM-PAC PT "6 Clicks" Mobility  Outcome Measure Help needed turning from your back to your side while in a flat bed without using bedrails?: A Little Help needed moving from lying on your back to sitting on the side of a flat bed without using bedrails?: A Little Help needed moving to and from a bed to a chair (including a wheelchair)?: A Little Help needed standing up from a chair using your arms (e.g., wheelchair or bedside chair)?: A Little Help needed to walk in hospital room?: A Little Help needed climbing 3-5 steps with a railing? : A Little 6 Click Score: 18    End of Session Equipment Utilized During Treatment: Gait belt Activity Tolerance: Patient tolerated treatment well Patient left: in bed Nurse Communication: Mobility status PT Visit Diagnosis: Muscle weakness (generalized) (M62.81);Difficulty in walking, not elsewhere classified (R26.2);Pain Pain - Right/Left: Right Pain - part of body: Hip    Time: 0347-4259 PT Time Calculation (min) (ACUTE ONLY): 20 min   Charges:   PT Evaluation $PT Eval Low Complexity: 1 Low PT Treatments $Gait Training: 8-22 mins PT General Charges $$ ACUTE PT VISIT: 1 Visit         Elizabeth Palau, PT, DPT,  [facility] [phone]   Laura Hale 11/10/2023, 5:00 PM

## 2023-11-10 NOTE — Anesthesia Procedure Notes (Cosign Needed)
 Spinal  Patient location during procedure: OR Start time: 11/10/2023 7:12 AM End time: 11/10/2023 7:14 AM Reason for block: surgical anesthesia Staffing Performed: resident/CRNA  Anesthesiologist: Yevette Edwards, MD Resident/CRNA: Owens Loffler, RN Performed by: Owens Loffler, RN Authorized by: Yevette Edwards, MD   Preanesthetic Checklist Completed: patient identified, IV checked, site marked, risks and benefits discussed, surgical consent, monitors and equipment checked, pre-op evaluation and timeout performed Spinal Block Patient position: sitting Prep: DuraPrep Patient monitoring: heart rate, cardiac monitor, continuous pulse ox and blood pressure Approach: midline Location: L3-4 Injection technique: single-shot Needle Needle type: Pencan  Needle gauge: 24 G Needle length: 10 cm Assessment Sensory level: T4 Events: CSF return

## 2023-11-10 NOTE — Interval H&P Note (Signed)
 History and Physical Interval Note:  11/10/2023 6:26 AM  Laura Hale  has presented today for surgery, with the diagnosis of PRIMARY OSTEOARTHRITIS OF RIGHT HIP.Marland Kitchen  The various methods of treatment have been discussed with the patient and family. After consideration of risks, benefits and other options for treatment, the patient has consented to  Procedure(s): ARTHROPLASTY, HIP, TOTAL,POSTERIOR APPROACH (Right) as a surgical intervention.  The patient's history has been reviewed, patient examined, no change in status, stable for surgery.  I have reviewed the patient's chart and labs.  Questions were answered to the patient's satisfaction.     Dalvin Clipper P Lilias Lorensen

## 2023-11-11 ENCOUNTER — Encounter: Payer: Self-pay | Admitting: Orthopedic Surgery

## 2023-11-11 DIAGNOSIS — M1611 Unilateral primary osteoarthritis, right hip: Secondary | ICD-10-CM | POA: Diagnosis not present

## 2023-11-11 LAB — GLUCOSE, CAPILLARY: Glucose-Capillary: 123 mg/dL — ABNORMAL HIGH (ref 70–99)

## 2023-11-11 MED ORDER — TRAMADOL HCL 50 MG PO TABS
50.0000 mg | ORAL_TABLET | ORAL | 0 refills | Status: DC | PRN
Start: 2023-11-11 — End: 2024-05-12

## 2023-11-11 MED ORDER — OXYCODONE HCL 5 MG PO TABS: 5.0000 mg | ORAL_TABLET | ORAL | 0 refills | Status: AC | PRN

## 2023-11-11 MED ORDER — ASPIRIN 81 MG PO TBEC: 81.0000 mg | DELAYED_RELEASE_TABLET | Freq: Two times a day (BID) | ORAL | Status: AC

## 2023-11-11 MED ORDER — CELECOXIB 200 MG PO CAPS: 200.0000 mg | ORAL_CAPSULE | Freq: Two times a day (BID) | ORAL | 1 refills | Status: DC

## 2023-11-11 NOTE — Progress Notes (Signed)
 Physical Therapy Treatment Patient Details Name: Laura Hale MRN: [id number] DOB: June 30, 1946 Today's Date: 11/11/2023   History of Present Illness Pt admitted for R THR. HIstory includes HLD, COPD, DM, and previous L THR. Pt is POD 0 at time of evaluation.    PT Comments  Pt was long sitting in bed upon arrival. She is A and O x 4. Agrees to session. Endorsing 4/10 pain at rest that elevated to 7/10 pain with activity. RN made aware of pt's request for pain meds post session. Overall pt was able to demonstrate safe abilities to exit bed, stand to RW, and tolerate ambulation > 120 ft. She demonstrated abilities to ascend/descend stairs to simulate home entry/exit. Author issued HEP with education on hip precautions and post acute expectations. Pt is doing well form an acute PT standpoint. Cleared for safe DC home from a PT standpoint. Pt has all DME needs met.    If plan is discharge home, recommend the following: A little help with walking and/or transfers;Help with stairs or ramp for entrance     Equipment Recommendations  None recommended by PT       Precautions / Restrictions Precautions Precautions: Fall;Posterior Hip Precaution Booklet Issued: No Restrictions Weight Bearing Restrictions Per Provider Order: Yes RLE Weight Bearing Per Provider Order: Weight bearing as tolerated     Mobility  Bed Mobility Overal bed mobility: Needs Assistance Bed Mobility: Supine to Sit, Sit to Supine Supine to sit: Supervision  Transfers Overall transfer level: Needs assistance Equipment used: Rolling walker (2 wheels) Transfers: Sit to/from Stand Sit to Stand: Supervision   Ambulation/Gait Ambulation/Gait assistance: Supervision Gait Distance (Feet): 150 Feet Assistive device: Rolling walker (2 wheels) Gait Pattern/deviations: Step-through pattern  General Gait Details: pt demonstrated safe steady gait. no LOB or safety concerns during ambulation. Pt does endorse pain increase to  7/10. RN made aware and ain meds being issued post session   Stairs Stairs: Yes Stairs assistance: Supervision Stair Management: One rail Right, Sideways Number of Stairs: 4 General stair comments: Pt demonstrated safe abilities to ascend/descend stairs with R rail and side stepping step to pattern    Balance Overall balance assessment: Needs assistance Sitting-balance support: Feet supported Sitting balance-Leahy Scale: Good     Standing balance support: Bilateral upper extremity supported, During functional activity Standing balance-Leahy Scale: Good       Communication Communication Communication: No apparent difficulties  Cognition Arousal: Alert Behavior During Therapy: WFL for tasks assessed/performed   PT - Cognitive impairments: No apparent impairments    PT - Cognition Comments: very pleasant and agreeable to session Following commands: Intact      Cueing Cueing Techniques: Verbal cues     General Comments General comments (skin integrity, edema, etc.): Chartered loss adjuster issued HEP handout and pt performed without physical assistance. Reviewed posterior hip precautions, car transfers, and overall expectations for post acute PT      Pertinent Vitals/Pain Pain Assessment Pain Assessment: No/denies pain     PT Goals (current goals can now be found in the care plan section) Acute Rehab PT Goals Patient Stated Goal: to go home Progress towards PT goals: Progressing toward goals    Frequency    BID       AM-PAC PT "6 Clicks" Mobility   Outcome Measure  Help needed turning from your back to your side while in a flat bed without using bedrails?: A Little Help needed moving from lying on your back to sitting on the side of a flat bed  without using bedrails?: A Little Help needed moving to and from a bed to a chair (including a wheelchair)?: A Little Help needed standing up from a chair using your arms (e.g., wheelchair or bedside chair)?: A Little Help needed to  walk in hospital room?: A Little Help needed climbing 3-5 steps with a railing? : A Little 6 Click Score: 18    End of Session   Activity Tolerance: Patient tolerated treatment well Patient left: in chair;with call bell/phone within reach;with nursing/sitter in room Nurse Communication: Mobility status PT Visit Diagnosis: Muscle weakness (generalized) (M62.81);Difficulty in walking, not elsewhere classified (R26.2);Pain Pain - Right/Left: Right Pain - part of body: Hip     Time: 0717-0739 PT Time Calculation (min) (ACUTE ONLY): 22 min  Charges:    $Gait Training: 8-22 mins PT General Charges $$ ACUTE PT VISIT: 1 Visit                     Jetta Lout PTA 11/11/23, 8:42 AM

## 2023-11-11 NOTE — Plan of Care (Signed)
   Problem: Clinical Measurements: Goal: Respiratory complications will improve Outcome: Progressing   Problem: Activity: Goal: Risk for activity intolerance will decrease Outcome: Progressing   Problem: Coping: Goal: Level of anxiety will decrease Outcome: Progressing

## 2023-11-11 NOTE — Progress Notes (Signed)
 DISCHARGE NOTE  Pt given discharge instructions, scripts and 2 honeycomb dressings, and verbalized understanding. TED hose on both legs. Pts personal walker sent with her. Pt wheeled to car by staff, husband providing transportation home.

## 2023-11-11 NOTE — Discharge Summary (Signed)
 Physician Discharge Summary  Subjective: 1 Day Post-Op Procedure(s) (LRB): ARTHROPLASTY, HIP, TOTAL,POSTERIOR APPROACH (Right) Patient reports pain as mild.   Patient seen in rounds with Dr. Ernest Pine. Patient is well, and has had no acute complaints or problems. Denies any CP, SOB, N/V, fevers or chills We will continue with therapy today.  Patient is ready to go home  Physician Discharge Summary  Patient ID: Laura Hale MRN: [id number] DOB/AGE: 1946-04-01 78 y.o.  Admit date: 11/10/2023 Discharge date: 11/11/2023  Admission Diagnoses:  Discharge Diagnoses:  Principal Problem:   Hx of total hip arthroplasty, right   Discharged Condition: good  Hospital Course: Patient presented to the hospital on 11/10/2023 for an elective right total hip arthroplasty performed by Dr. Ernest Pine. Patient was given 1g of TXA and 2g of Ancef prior to the procedure. she tolerated the procedure well without any complications. See procedural note below for details. Postoperatively, the patient did very well. she was able to pass PT protocols on post-op day one without any issues. JP drain was removed without any difficulty and was intact. she was able to void her bladder without any difficulty. Physical exam was unremarkable. she denies any SOB, CP, N/V, fevers or chills. Vital signs are stable. Patient is stable to discharge home.  PROCEDURE:  Right total hip arthroplasty   SURGEON:  Jena Gauss. M.D.   ASSISTANT:  Gean Birchwood, PA-C (present and scrubbed throughout the case, critical for assistance with exposure, retraction, instrumentation, and closure)   ANESTHESIA: spinal   ESTIMATED BLOOD LOSS: 150 mL   FLUIDS REPLACED: 700 mL of crystalloid   DRAINS: 2 medium Hemovac drains   IMPLANTS UTILIZED: DePuy size 7 standard offset Actis femoral stem, 52 mm OD Pinnacle GRIPTION Sector acetabular component, two 6.5 mm cancellous screws, +4 mm neutral Pinnacle Altrx polyethylene insert, and a  36 mm M-SPEC -2 mm hip ball  Treatments: none  Discharge Exam: Blood pressure 129/68, pulse 83, temperature 98.5 F (36.9 C), temperature source Temporal, resp. rate 16, height 5' 5.5" (1.664 m), weight 103 kg, SpO2 98%.   Disposition: home   Allergies as of 11/11/2023   No Known Allergies      Medication List     TAKE these medications    acetaminophen 650 MG CR tablet Commonly known as: TYLENOL Take 1,300 mg by mouth every 8 (eight) hours.   artificial tears ointment Place 1 drop into both eyes as needed (dry eyes).   aspirin EC 81 MG tablet Take 1 tablet (81 mg total) by mouth 2 (two) times daily. Swallow whole. What changed: when to take this   atorvastatin 80 MG tablet Commonly known as: LIPITOR Take 80 mg by mouth daily.   celecoxib 200 MG capsule Commonly known as: CELEBREX Take 1 capsule (200 mg total) by mouth 2 (two) times daily.   cetirizine 10 MG tablet Commonly known as: ZYRTEC Take 10 mg by mouth daily.   fluocinolone 0.01 % external solution Commonly known as: SYNALAR Apply 1 drop topically as needed.   gabapentin 300 MG capsule Commonly known as: NEURONTIN Take 300 mg by mouth at bedtime.   Ivermectin 1 % Crea Apply 1 Application topically at bedtime. Qhs to face for Rosacea What changed: additional instructions   lactulose 10 GM/15ML solution Commonly known as: CHRONULAC Take 20-30 g by mouth at bedtime.   oxyCODONE 5 MG immediate release tablet Commonly known as: Oxy IR/ROXICODONE Take 1 tablet (5 mg total) by mouth every 4 (four) hours as  needed for moderate pain (pain score 4-6) (pain score 4-6).   Ozempic (2 MG/DOSE) 8 MG/3ML Sopn Generic drug: Semaglutide (2 MG/DOSE) Inject 2 mg into the skin once a week.   tizanidine 2 MG capsule Commonly known as: ZANAFLEX Take 2 mg by mouth at bedtime.   traMADol 50 MG tablet Commonly known as: ULTRAM Take 1-2 tablets (50-100 mg total) by mouth every 4 (four) hours as needed for  moderate pain (pain score 4-6).   Vitamin D3 50 MCG (2000 UT) capsule Take 2,000 Units by mouth daily.               Durable Medical Equipment  (From admission, onward)           Start     Ordered   11/10/23 1206  DME Walker rolling  Once       Question:  Patient needs a walker to treat with the following condition  Answer:  S/P total hip arthroplasty   11/10/23 1205   11/10/23 1206  DME Bedside commode  Once       Comments: Patient is not able to walk the distance required to go the bathroom, or he/she is unable to safely negotiate stairs required to access the bathroom.  A 3in1 BSC will alleviate this problem  Question:  Patient needs a bedside commode to treat with the following condition  Answer:  S/P total hip arthroplasty   11/10/23 1205            Follow-up Information     Hooten, Illene Labrador, MD Follow up on 12/23/2023.   Specialty: Orthopedic Surgery Why: at 1:45pm Contact information: [address] [facility] [place] [address] [phone]                 Signed: Gean Birchwood 11/11/2023, 8:30 AM   Objective: Vital signs in last 24 hours: Temp:  [97.7 F (36.5 C)-98.6 F (37 C)] 98.5 F (36.9 C) (04/01 0423) Pulse Rate:  [67-106] 83 (04/01 0423) Resp:  [11-20] 16 (04/01 0423) BP: (116-161)/(59-86) 129/68 (04/01 0423) SpO2:  [92 %-100 %] 98 % (04/01 0423)  Intake/Output from previous day:  Intake/Output Summary (Last 24 hours) at 11/11/2023 0830 Last data filed at 11/11/2023 0424 Gross per 24 hour  Intake 1898.1 ml  Output 1535 ml  Net 363.1 ml    Intake/Output this shift: No intake/output data recorded.  Labs: No results for input(s): "HGB" in the last 72 hours. No results for input(s): "WBC", "RBC", "HCT", "PLT" in the last 72 hours. No results for input(s): "NA", "K", "CL", "CO2", "BUN", "CREATININE", "GLUCOSE", "CALCIUM" in the last 72 hours. No results for input(s): "LABPT", "INR" in the last 72  hours.  EXAM: General - Patient is Alert, Appropriate, and Oriented Extremity - Neurologically intact Neurovascular intact Sensation intact distally Intact pulses distally Dorsiflexion/Plantar flexion intact No cellulitis present Compartment soft Dressing - dressing C/D/I and no drainage Motor Function - intact, moving foot and toes well on exam. JP Drain pulled without difficulty. 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   Hypercalcemia 2009   calcium  WNL 10.2 on 04-2015   Hyperglycemia 06/18/2020   Hyperlipidemia    Hypertension    Joint stiffness of hand, left    cannot lay hand flat S/P elbow surgery   Neuropathy 04/13/2014   Obesity    Pre-diabetes    Primary osteoarthritis of left hip    Primary osteoarthritis of left knee 01/19/2016   Primary osteoarthritis of right knee 11/20/2015   Pulmonary nodules 06/07/2020   Rotator cuff tendinitis, left  11/20/2015   Sleep apnea    No CPAP   Urinary incontinence    Vitamin D deficiency 10/15/2013   Principal Problem:   Hx of total hip arthroplasty, right  Estimated body mass index is 37.2 kg/m as calculated from the following:   Height as of this encounter: 5' 5.5" (1.664 m).   Weight as of this encounter: 103 kg.   Patient will continue to work with physical therapy    Hip Preacutions   Discussed with the patient continuing to utilize ice over the bandage   Patient will wear TED hose bilaterally to help prevent DVT and clot formation   Discussed the Aquacel bandage.  This bandage will stay in place 7 days postoperatively.  Can be replaced with honeycomb bandages that will be sent home with the patient   Discussed sending the patient home with tramadol and oxycodone for as needed pain management.  Patient will also be sent home with Celebrex to help with swelling and inflammation.  Patient will take an 81 mg aspirin twice daily for DVT prophylaxis   JP drain removed without difficulty, intact   Weight-Bearing as tolerated to right leg   Patient will follow-up with [facility] orth[facility]ics in 6 weeks for re-imaging and reevaluation  Diet - Regular diet Follow up - in 6 weeks Activity - WBAT Disposition - Home Condition Upon Discharge - Good DVT Prophylaxis - Aspirin and TED hose  Danise Edge, PA-C Orthopaedic Surgery 11/11/2023, 8:30 AM

## 2023-11-11 NOTE — Anesthesia Postprocedure Evaluation (Signed)
 Anesthesia Post Note  Patient: Laura Hale  Procedure(s) Performed: ARTHROPLASTY, HIP, TOTAL,POSTERIOR APPROACH (Right: Hip)  Patient location during evaluation: Nursing Unit Anesthesia Type: Spinal Level of consciousness: oriented and awake and alert Pain management: pain level controlled Vital Signs Assessment: post-procedure vital signs reviewed and stable Respiratory status: spontaneous breathing and respiratory function stable Cardiovascular status: blood pressure returned to baseline and stable Postop Assessment: no headache, no backache, no apparent nausea or vomiting and able to ambulate Anesthetic complications: no   No notable events documented.   Last Vitals:  Vitals:   11/11/23 0000 11/11/23 0423  BP: 130/69 129/68  Pulse: 75 83  Resp: 16 16  Temp: 36.9 C 36.9 C  SpO2: 98% 98%    Last Pain:  Vitals:   11/11/23 0518  TempSrc:   PainSc: Asleep                 Jules Schick

## 2023-11-11 NOTE — Progress Notes (Signed)
 Subjective: 1 Day Post-Op Procedure(s) (LRB): ARTHROPLASTY, HIP, TOTAL,POSTERIOR APPROACH (Right) Patient reports pain as mild.   Patient seen in rounds with Dr. Ernest Pine. Patient is well, and has had no acute complaints or problems. Denies any CP, SOB, N/V, fevers or chills We will continue with therapy today.  Plan is to go Home after hospital stay.  Objective: Vital signs in last 24 hours: Temp:  [97.7 F (36.5 C)-98.6 F (37 C)] 98.5 F (36.9 C) (04/01 0423) Pulse Rate:  [67-106] 83 (04/01 0423) Resp:  [11-20] 16 (04/01 0423) BP: (116-161)/(59-86) 129/68 (04/01 0423) SpO2:  [92 %-100 %] 98 % (04/01 0423)  Intake/Output from previous day:  Intake/Output Summary (Last 24 hours) at 11/11/2023 0751 Last data filed at 11/11/2023 0424 Gross per 24 hour  Intake 1898.1 ml  Output 1610 ml  Net 288.1 ml    Intake/Output this shift: No intake/output data recorded.  Labs: No results for input(s): "HGB" in the last 72 hours. No results for input(s): "WBC", "RBC", "HCT", "PLT" in the last 72 hours. No results for input(s): "NA", "K", "CL", "CO2", "BUN", "CREATININE", "GLUCOSE", "CALCIUM" in the last 72 hours. No results for input(s): "LABPT", "INR" in the last 72 hours.  EXAM General - Patient is Alert, Appropriate, and Oriented Extremity - Neurologically intact Neurovascular intact Sensation intact distally Intact pulses distally Dorsiflexion/Plantar flexion intact No cellulitis present Compartment soft Dressing - dressing C/D/I and no drainage Motor Function - intact, moving foot and toes well on exam. JP Drain pulled without difficulty. 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   Hypercalcemia 2009   calcium  WNL 10.2 on 04-2015   Hyperglycemia 06/18/2020   Hyperlipidemia    Hypertension    Joint stiffness of hand, left    cannot lay hand flat S/P elbow surgery   Neuropathy 04/13/2014   Obesity    Pre-diabetes    Primary osteoarthritis of left hip    Primary osteoarthritis of left knee 01/19/2016   Primary osteoarthritis of right knee 11/20/2015   Pulmonary nodules 06/07/2020   Rotator cuff tendinitis, left 11/20/2015   Sleep apnea    No CPAP   Urinary incontinence    Vitamin D deficiency 10/15/2013    Assessment/Plan: 1 Day Post-Op Procedure(s) (LRB): ARTHROPLASTY, HIP, TOTAL,POSTERIOR APPROACH (Right) Principal Problem:   Hx of total hip arthroplasty, right  Estimated body mass index is 37.2 kg/m as calculated from the following:   Height as of this encounter: 5' 5.5" (1.664 m).   Weight as of this encounter:  103 kg. Advance diet Up with therapy  Patient will continue to work with physical therapy to pass postoperative PT protocols, ROM and strengthening  Hip Preacutions  Discussed with the patient continuing to utilize ice over the bandage  Patient will wear TED hose bilaterally to help prevent DVT and clot formation  Discussed the Aquacel bandage.  This bandage will stay in place 7 days postoperatively.  Can be replaced with honeycomb bandages that will be sent home with the  patient  Discussed sending the patient home with tramadol and oxycodone for as needed pain management.  Patient will also be sent home with Celebrex to help with swelling and inflammation.  Patient will take an 81 mg aspirin twice daily for DVT prophylaxis  JP drain removed without difficulty, intact  Weight-Bearing as tolerated to right leg  Patient will follow-up with [facility] orthopedics in 6 weeks for re-imaging and reevaluation   Rayburn Go, PA-C [facility] Orthopa[facility] 11/11/2023, 7:51 AM

## 2023-11-11 NOTE — TOC Progression Note (Signed)
 Transition of Care [facility]) - Progression Note    Patient Details  Name: Laura Hale MRN: [id number] Date of Birth: 1946-06-08  [facility] CM/SW Contact  Marlowe Sax, RN Phone Number: 11/11/2023, 8:51 AM  Clinical Narrative:    The patient is set up with [facility] prior to surgery by surgeons office for [facility]   Expected Discharge Plan: Home w Home Health Services Barriers to Discharge: Barriers Resolved  Expected Discharge Plan and Services   Discharge Planning Services: CM Consult   Living arrangements for the past 2 months: Single Family Home Expected Discharge Date: 11/11/23               DME Arranged: N/A DME Agency: NA       HH Arranged: PT, OT HH Agency: [facility] Date HH Agency Contacted: 11/11/23 Time HH Agency Contacted: [phone] Representative spoke with at [facility] Agency: [place]   Social Determinants of Health (SDOH) Interventions SDOH Screenings   Food Insecurity: No Food Insecurity (11/10/2023)  Housing: Low Risk  (11/10/2023)  Transportation Needs: No Transportation Needs (11/10/2023)  Utilities: Not At Risk (11/10/2023)  Financial Resource Strain: Low Risk  (09/17/2023)   Received from [facility]  Social Connections: Socially Integrated (11/10/2023)  Tobacco Use: Medium Risk (11/10/2023)    Readmission Risk Interventions     No data to display

## 2023-11-11 NOTE — Progress Notes (Signed)
 Occupational Therapy Evaluation Patient Details Name: Laura Hale MRN: [id number] DOB: 1945/08/17 Today's Date: 11/11/2023   History of Present Illness   Pt admitted for R THR. HIstory includes HLD, COPD, DM, and previous L THR. Pt is POD 0 at time of evaluation.     Clinical Impressions Pt seen for OT evaluation this date, POD#1 from above surgery. Pt was independent in all ADLs prior to surgery, however occasionally using RW for MOBILITY/ADL due to R hip pain. Pt is eager to return to PLOF with less pain and improved safety and independence. Amb to BR with RW with good adherence to hip precautions throughout. Pt currently requires no physical assist for LB dressing while in seated, pt utilized reacher during LB dressing tasks. Pt instructed in posterior total hip precautions and how to implement, self care skills, falls prevention strategies, home/routines modifications, DME/AE for LB bathing and dressing tasks, and compression stocking mgt strategies. Pt able to recall 3/3 total hip precautions at start of session and able to verbalize how to implement during ADL and mobility. Pt would benefit from additional instruction in self care skills and techniques to help maintain precautions with or without assistive devices to support recall and carryover prior to discharge. OT will follow acutely.    If plan is discharge home, recommend the following:   A little help with walking and/or transfers;A little help with bathing/dressing/bathroom;Assist for transportation;Help with stairs or ramp for entrance;Assistance with cooking/housework     Functional Status Assessment   Patient has had a recent decline in their functional status and demonstrates the ability to make significant improvements in function in a reasonable and predictable amount of time.     Equipment Recommendations   None recommended by OT     Recommendations for Other Services          Precautions/Restrictions   Precautions Precautions: Fall;Posterior Hip Precaution Booklet Issued: No Recall of Precautions/Restrictions: Intact     Mobility Bed Mobility               General bed mobility comments: NT in recliner pre/post session    Transfers Overall transfer level: Needs assistance Equipment used: Rolling walker (2 wheels) Transfers: Sit to/from Stand Sit to Stand: Supervision                  Balance Overall balance assessment: Needs assistance Sitting-balance support: Feet supported Sitting balance-Leahy Scale: Good     Standing balance support: Bilateral upper extremity supported, During functional activity Standing balance-Leahy Scale: Good                             ADL either performed or assessed with clinical judgement   ADL Overall ADL's : Needs assistance/impaired     Grooming: Oral care;Wash/dry face;Wash/dry hands;Brushing hair;Standing;Cueing for safety               Lower Body Dressing: Sit to/from stand;With adaptive equipment;Contact guard assist;Adhering to hip precautions   Toilet Transfer: Contact guard assist;Ambulation;Rolling walker (2 wheels);Adhering to hip precautions   Toileting- Clothing Manipulation and Hygiene: Sit to/from stand;Modified independent       Functional mobility during ADLs: Rolling walker (2 wheels);Contact guard assist General ADL Comments: Sink level ADLs wtih good adherence to hip precautions throughout, no LOB noted     Vision Baseline Vision/History: 1 Wears glasses  Pertinent Vitals/Pain Pain Assessment Pain Assessment: 0-10 Pain Score: 6  Pain Location: Hip operative site Pain Descriptors / Indicators: Operative site guarding Pain Intervention(s): Limited activity within patient's tolerance, Premedicated before session     Extremity/Trunk Assessment Upper Extremity Assessment Upper Extremity Assessment: Overall WFL for  tasks assessed   Lower Extremity Assessment Lower Extremity Assessment: RLE deficits/detail RLE Deficits / Details: R THA   Cervical / Trunk Assessment Cervical / Trunk Assessment: Normal   Communication Communication Communication: No apparent difficulties   Cognition Arousal: Alert Behavior During Therapy: WFL for tasks assessed/performed Cognition: No apparent impairments             OT - Cognition Comments: A/Ox4                 Following commands: Intact       Cueing  General Comments   Cueing Techniques: Verbal cues  Hip incision d/c/i pre/post session   Exercises Exercises: Other exercises Other Exercises Other Exercises: Edu: role of OT, review of hip precautions, safe ADL completion, AD use for IADLs   Shoulder Instructions      Home Living Family/patient expects to be discharged to:: Private residence Living Arrangements: Spouse/significant other Available Help at Discharge: Family Type of Home: House Home Access: Stairs to enter Secretary/administrator of Steps: 2 Entrance Stairs-Rails: Can reach both Home Layout: One level     Bathroom Shower/Tub: Estate manager/land agent Accessibility: Yes How Accessible: Accessible via walker Home Equipment: Agricultural consultant (2 wheels);BSC/3in1          Prior Functioning/Environment Prior Level of Function : Independent/Modified Independent             Mobility Comments: Used RW for previous 3 months due to hip pain ADLs Comments: indep    OT Problem List: Decreased activity tolerance;Impaired balance (sitting and/or standing);Decreased safety awareness;Decreased knowledge of use of DME or AE;Decreased strength   OT Treatment/Interventions: Self-care/ADL training;Therapeutic exercise;Energy conservation;DME and/or AE instruction;Patient/family education      OT Goals(Current goals can be found in the care plan section)   Acute Rehab OT Goals Patient Stated Goal: to have less  pain OT Goal Formulation: With patient Time For Goal Achievement: 11/25/23 Potential to Achieve Goals: Good   OT Frequency:  Min 2X/week    Co-evaluation              AM-PAC OT "6 Clicks" Daily Activity     Outcome Measure Help from another person eating meals?: None Help from another person taking care of personal grooming?: A Little Help from another person toileting, which includes using toliet, bedpan, or urinal?: None Help from another person bathing (including washing, rinsing, drying)?: A Little Help from another person to put on and taking off regular upper body clothing?: None Help from another person to put on and taking off regular lower body clothing?: A Little 6 Click Score: 21   End of Session Equipment Utilized During Treatment: Gait belt;Rolling walker (2 wheels) Nurse Communication: Mobility status  Activity Tolerance: Patient tolerated treatment well Patient left: in chair;with call bell/phone within reach  OT Visit Diagnosis: Unsteadiness on feet (R26.81);Other abnormalities of gait and mobility (R26.89);Repeated falls (R29.6);Muscle weakness (generalized) (M62.81)                Time: 1308-6578 OT Time Calculation (min): 25 min Charges:  OT General Charges $OT Visit: 1 Visit OT Treatments $Self Care/Home Management : 8-22 mins  Glenard Haring M.S. OTR/L  11/11/23, 9:40  AM

## 2023-11-11 NOTE — Plan of Care (Signed)
  Problem: Pain Managment: Goal: General experience of comfort will improve and/or be controlled Outcome: Progressing   Problem: Safety: Goal: Ability to remain free from injury will improve Outcome: Progressing

## 2023-11-12 DIAGNOSIS — Z791 Long term (current) use of non-steroidal anti-inflammatories (NSAID): Secondary | ICD-10-CM | POA: Diagnosis not present

## 2023-11-12 DIAGNOSIS — Z85828 Personal history of other malignant neoplasm of skin: Secondary | ICD-10-CM | POA: Diagnosis not present

## 2023-11-12 DIAGNOSIS — J4489 Other specified chronic obstructive pulmonary disease: Secondary | ICD-10-CM | POA: Diagnosis not present

## 2023-11-12 DIAGNOSIS — Z7985 Long-term (current) use of injectable non-insulin antidiabetic drugs: Secondary | ICD-10-CM | POA: Diagnosis not present

## 2023-11-12 DIAGNOSIS — Z96643 Presence of artificial hip joint, bilateral: Secondary | ICD-10-CM | POA: Diagnosis not present

## 2023-11-12 DIAGNOSIS — Z7982 Long term (current) use of aspirin: Secondary | ICD-10-CM | POA: Diagnosis not present

## 2023-11-12 DIAGNOSIS — R911 Solitary pulmonary nodule: Secondary | ICD-10-CM | POA: Diagnosis not present

## 2023-11-12 DIAGNOSIS — G4730 Sleep apnea, unspecified: Secondary | ICD-10-CM | POA: Diagnosis not present

## 2023-11-12 DIAGNOSIS — Z471 Aftercare following joint replacement surgery: Secondary | ICD-10-CM | POA: Diagnosis not present

## 2023-11-12 DIAGNOSIS — Z6837 Body mass index (BMI) 37.0-37.9, adult: Secondary | ICD-10-CM | POA: Diagnosis not present

## 2023-11-12 DIAGNOSIS — Z860100 Personal history of colon polyps, unspecified: Secondary | ICD-10-CM | POA: Diagnosis not present

## 2023-11-12 DIAGNOSIS — E669 Obesity, unspecified: Secondary | ICD-10-CM | POA: Diagnosis not present

## 2023-11-12 DIAGNOSIS — Z96652 Presence of left artificial knee joint: Secondary | ICD-10-CM | POA: Diagnosis not present

## 2023-11-12 DIAGNOSIS — I10 Essential (primary) hypertension: Secondary | ICD-10-CM | POA: Diagnosis not present

## 2023-11-12 DIAGNOSIS — M1711 Unilateral primary osteoarthritis, right knee: Secondary | ICD-10-CM | POA: Diagnosis not present

## 2023-11-12 DIAGNOSIS — E785 Hyperlipidemia, unspecified: Secondary | ICD-10-CM | POA: Diagnosis not present

## 2023-11-12 DIAGNOSIS — E559 Vitamin D deficiency, unspecified: Secondary | ICD-10-CM | POA: Diagnosis not present

## 2023-11-12 DIAGNOSIS — E1140 Type 2 diabetes mellitus with diabetic neuropathy, unspecified: Secondary | ICD-10-CM | POA: Diagnosis not present

## 2023-11-27 DIAGNOSIS — Z471 Aftercare following joint replacement surgery: Secondary | ICD-10-CM | POA: Diagnosis not present

## 2023-12-23 DIAGNOSIS — Z96641 Presence of right artificial hip joint: Secondary | ICD-10-CM | POA: Diagnosis not present

## 2024-02-10 DIAGNOSIS — E1159 Type 2 diabetes mellitus with other circulatory complications: Secondary | ICD-10-CM | POA: Diagnosis not present

## 2024-02-10 DIAGNOSIS — I700 Atherosclerosis of aorta: Secondary | ICD-10-CM | POA: Diagnosis not present

## 2024-02-10 DIAGNOSIS — E7849 Other hyperlipidemia: Secondary | ICD-10-CM | POA: Diagnosis not present

## 2024-02-10 DIAGNOSIS — M5020 Other cervical disc displacement, unspecified cervical region: Secondary | ICD-10-CM | POA: Diagnosis not present

## 2024-02-17 DIAGNOSIS — E559 Vitamin D deficiency, unspecified: Secondary | ICD-10-CM | POA: Diagnosis not present

## 2024-02-17 DIAGNOSIS — E7849 Other hyperlipidemia: Secondary | ICD-10-CM | POA: Diagnosis not present

## 2024-02-17 DIAGNOSIS — G629 Polyneuropathy, unspecified: Secondary | ICD-10-CM | POA: Diagnosis not present

## 2024-02-17 DIAGNOSIS — E1159 Type 2 diabetes mellitus with other circulatory complications: Secondary | ICD-10-CM | POA: Diagnosis not present

## 2024-02-17 DIAGNOSIS — I700 Atherosclerosis of aorta: Secondary | ICD-10-CM | POA: Diagnosis not present

## 2024-02-17 DIAGNOSIS — M5020 Other cervical disc displacement, unspecified cervical region: Secondary | ICD-10-CM | POA: Diagnosis not present

## 2024-02-17 DIAGNOSIS — J4489 Other specified chronic obstructive pulmonary disease: Secondary | ICD-10-CM | POA: Diagnosis not present

## 2024-02-17 DIAGNOSIS — I10 Essential (primary) hypertension: Secondary | ICD-10-CM | POA: Diagnosis not present

## 2024-02-26 ENCOUNTER — Other Ambulatory Visit: Payer: Self-pay | Admitting: Internal Medicine

## 2024-02-26 DIAGNOSIS — Z1231 Encounter for screening mammogram for malignant neoplasm of breast: Secondary | ICD-10-CM

## 2024-03-04 DIAGNOSIS — Z961 Presence of intraocular lens: Secondary | ICD-10-CM | POA: Diagnosis not present

## 2024-03-04 DIAGNOSIS — H26492 Other secondary cataract, left eye: Secondary | ICD-10-CM | POA: Diagnosis not present

## 2024-03-04 DIAGNOSIS — H04123 Dry eye syndrome of bilateral lacrimal glands: Secondary | ICD-10-CM | POA: Diagnosis not present

## 2024-03-04 DIAGNOSIS — H43813 Vitreous degeneration, bilateral: Secondary | ICD-10-CM | POA: Diagnosis not present

## 2024-03-12 ENCOUNTER — Ambulatory Visit
Admission: RE | Admit: 2024-03-12 | Discharge: 2024-03-12 | Disposition: A | Discharge status: Home / Self Care | Source: Ambulatory Visit | Attending: Internal Medicine | Admitting: Internal Medicine

## 2024-03-12 DIAGNOSIS — Z1231 Encounter for screening mammogram for malignant neoplasm of breast: Secondary | ICD-10-CM | POA: Insufficient documentation

## 2024-03-17 ENCOUNTER — Other Ambulatory Visit: Payer: Self-pay | Admitting: Internal Medicine

## 2024-03-17 DIAGNOSIS — R928 Other abnormal and inconclusive findings on diagnostic imaging of breast: Secondary | ICD-10-CM

## 2024-03-19 ENCOUNTER — Ambulatory Visit
Admission: RE | Admit: 2024-03-19 | Discharge: 2024-03-19 | Disposition: A | Discharge status: Home / Self Care | Source: Ambulatory Visit | Attending: Internal Medicine | Admitting: Internal Medicine

## 2024-03-19 DIAGNOSIS — R928 Other abnormal and inconclusive findings on diagnostic imaging of breast: Secondary | ICD-10-CM

## 2024-03-30 DIAGNOSIS — Z124 Encounter for screening for malignant neoplasm of cervix: Secondary | ICD-10-CM | POA: Diagnosis not present

## 2024-03-30 DIAGNOSIS — Z1331 Encounter for screening for depression: Secondary | ICD-10-CM | POA: Diagnosis not present

## 2024-04-16 DIAGNOSIS — Z23 Encounter for immunization: Secondary | ICD-10-CM | POA: Diagnosis not present

## 2024-05-12 ENCOUNTER — Ambulatory Visit: Admitting: Dermatology

## 2024-05-12 DIAGNOSIS — D2239 Melanocytic nevi of other parts of face: Secondary | ICD-10-CM | POA: Diagnosis not present

## 2024-05-12 DIAGNOSIS — L219 Seborrheic dermatitis, unspecified: Secondary | ICD-10-CM

## 2024-05-12 DIAGNOSIS — L578 Other skin changes due to chronic exposure to nonionizing radiation: Secondary | ICD-10-CM

## 2024-05-12 DIAGNOSIS — D1801 Hemangioma of skin and subcutaneous tissue: Secondary | ICD-10-CM

## 2024-05-12 DIAGNOSIS — L719 Rosacea, unspecified: Secondary | ICD-10-CM

## 2024-05-12 DIAGNOSIS — L820 Inflamed seborrheic keratosis: Secondary | ICD-10-CM | POA: Diagnosis not present

## 2024-05-12 DIAGNOSIS — L814 Other melanin hyperpigmentation: Secondary | ICD-10-CM | POA: Diagnosis not present

## 2024-05-12 DIAGNOSIS — Z872 Personal history of diseases of the skin and subcutaneous tissue: Secondary | ICD-10-CM | POA: Diagnosis not present

## 2024-05-12 DIAGNOSIS — W908XXA Exposure to other nonionizing radiation, initial encounter: Secondary | ICD-10-CM

## 2024-05-12 DIAGNOSIS — L821 Other seborrheic keratosis: Secondary | ICD-10-CM | POA: Diagnosis not present

## 2024-05-12 DIAGNOSIS — Z85828 Personal history of other malignant neoplasm of skin: Secondary | ICD-10-CM

## 2024-05-12 DIAGNOSIS — Z1283 Encounter for screening for malignant neoplasm of skin: Secondary | ICD-10-CM | POA: Diagnosis not present

## 2024-05-12 DIAGNOSIS — D229 Melanocytic nevi, unspecified: Secondary | ICD-10-CM

## 2024-05-12 NOTE — Progress Notes (Signed)
 Follow-Up Visit   Subjective  Laura Hale is a 78 y.o. female who presents for the following: Skin Cancer Screening and Full Body Skin Exam  The patient presents for Total-Body Skin Exam (TBSE) for skin cancer screening and mole check. The patient has spots, moles and lesions to be evaluated, some may be new or changing. History of BCC of the nasal tip. Seborrheic dermatitis of the ears controlled with fluocinolone  oil.    The following portions of the chart were reviewed this encounter and updated as appropriate: medications, allergies, medical history  Review of Systems:  No other skin or systemic complaints except as noted in HPI or Assessment and Plan.  Objective  Well appearing patient in no apparent distress; mood and affect are within normal limits.  A full examination was performed including scalp, head, eyes, ears, nose, lips, neck, chest, axillae, abdomen, back, buttocks, bilateral upper extremities, bilateral lower extremities, hands, feet, fingers, toes, fingernails, and toenails. All findings within normal limits unless otherwise noted below.   Relevant physical exam findings are noted in the Assessment and Plan.  R calf Erythematous stuck-on, waxy papule at right calf. Smaller similar area at left lower pretibia irritated by compression socks (not treated today)  Assessment & Plan   SKIN CANCER SCREENING PERFORMED TODAY.  ACTINIC DAMAGE - Chronic condition, secondary to cumulative UV/sun exposure - diffuse scaly erythematous macules with underlying dyspigmentation - Recommend daily broad spectrum sunscreen SPF 30+ to sun-exposed areas, reapply every 2 hours as needed.  - Staying in the shade or wearing long sleeves, sun glasses (UVA+UVB protection) and wide brim hats (4-inch brim around the entire circumference of the hat) are also recommended for sun protection.  - Call for new or changing lesions.  LENTIGINES, SEBORRHEIC KERATOSES, HEMANGIOMAS - Benign  normal skin lesions - Benign-appearing - Call for any changes  MELANOCYTIC NEVI - Tan-brown and/or pink-flesh-colored symmetric macules and papules - Benign appearing on exam today - Observation - Call clinic for new or changing moles - Recommend daily use of broad spectrum spf 30+ sunscreen to sun-exposed areas.   Fibrous papule of nose Exam: right nasal prox ala rim - 1.0 mm firm flesh papule.   Benign-appearing.  Observation.  Call clinic for new or changing moles.  Recommend daily use of broad spectrum spf 30+ sunscreen to sun-exposed areas.   History of Basal Cell Carcinoma of the Skin Nasal tip 2006 - No evidence of recurrence today - Recommend regular full body skin exams - Recommend daily broad spectrum sunscreen SPF 30+ to sun-exposed areas, reapply every 2 hours as needed.  - Call if any new or changing lesions are noted between office visits   HISTORY OF PRECANCEROUS ACTINIC KERATOSIS - site(s) of PreCancerous Actinic Keratosis clear today. - these may recur and new lesions may form requiring treatment to prevent transformation into skin cancer - observe for new or changing spots and contact [facility] for appointment if occur - photoprotection with sun protective clothing; sunglasses and broad spectrum sunscreen with SPF of at least 30 + and frequent self skin exams recommended - yearly exams by a dermatologist recommended for persons with history of PreCancerous Actinic Keratoses  SEBORRHEIC DERMATITIS Exam: mild erythema ear canals   Chronic condition with duration or expected duration over one year. Currently well-controlled.   Seborrheic Dermatitis is a chronic persistent rash characterized by pinkness and scaling most commonly of the mid face but also can occur on the scalp (dandruff), ears; mid chest, mid  back and groin.  It tends to be exacerbated by stress and cooler weather.  People who have neurologic disease may experience new onset or exacerbation  of existing seborrheic dermatitis.  The condition is not curable but treatable and can be controlled.   Treatment Plan: Continue Fluocinolone  oil to aa's ears QD-BID PRN. Patient will call for refills.    Rosacea Exam: Mild erythema and telangiectasias mid face.   Chronic condition with duration or expected duration over one year. Currently well-controlled.    Rosacea is a chronic progressive skin condition usually affecting the face of adults, causing redness and/or acne bumps. It is treatable but not curable. It sometimes affects the eyes (ocular rosacea) as well. It may respond to topical and/or systemic medication and can flare with stress, sun exposure, alcohol , exercise, topical steroids (including hydrocortisone/cortisone 10) and some foods.  Daily application of broad spectrum spf 30+ sunscreen to face is recommended to reduce flares.    Continue [facility] rosacea triple cream qhs as needed. Refills sent to [facility].   INFLAMED SEBORRHEIC KERATOSIS R calf Symptomatic, irritating, patient would like treated.   Destruction of lesion - R calf  Destruction method: cryotherapy   Informed consent: discussed and consent obtained   Lesion destroyed using liquid nitrogen: Yes   Region frozen until ice ball extended beyond lesion: Yes   Outcome: patient tolerated procedure well with no complications   Post-procedure details: wound care instructions given   Additional details:  Prior to procedure, discussed risks of blister formation, small wound, skin dyspigmentation, or rare scar following cryotherapy. Recommend Vaseline ointment to treated areas while healing.   Return in about 1 year (around 05/12/2025) for TBSE, Hx BCC.  IAndrea Kerns, CMA, am acting as scribe for Rexene Rattler, MD .   Documentation: I have reviewed the above documentation for accuracy and completeness, and I agree with the above.  Rexene Rattler, MD

## 2024-05-12 NOTE — Patient Instructions (Addendum)
 Cryotherapy Aftercare  Wash gently with soap and water everyday.   Apply Vaseline and Band-Aid daily until healed.   Recommend OTC adapalene 0.1% gel pea sized amount to entire face nightly as tolerated.  This can be used to treat acne (whiteheads, blackheads) and milia (tiny firm white cysts).  It may cause dry irritated skin with initial use, and to minimize this, we recommend applying a light moisturizer to face before applying adapalene and/or applying it less frequently.  OTC brands include Differin 0.1% gel (Galderma), Adapalene 0.1% gel (Neutrogena), and Effaclar gel ( La Roche Posay).  They are found in the acne section on the pharmacy.   Seborrheic Keratosis  What causes seborrheic keratoses? Seborrheic keratoses are harmless, common skin growths that first appear during adult life.  As time goes by, more growths appear.  Some people may develop a large number of them.  Seborrheic keratoses appear on both covered and uncovered body parts.  They are not caused by sunlight.  The tendency to develop seborrheic keratoses can be inherited.  They vary in color from skin-colored to gray, brown, or even black.  They can be either smooth or have a rough, warty surface.   Seborrheic keratoses are superficial and look as if they were stuck on the skin.  Under the microscope this type of keratosis looks like layers upon layers of skin.  That is why at times the top layer may seem to fall off, but the rest of the growth remains and re-grows.    Treatment Seborrheic keratoses do not need to be treated, but can easily be removed in the office.  Seborrheic keratoses often cause symptoms when they rub on clothing or jewelry.  Lesions can be in the way of shaving.  If they become inflamed, they can cause itching, soreness, or burning.  Removal of a seborrheic keratosis can be accomplished by freezing, burning, or surgery. If any spot bleeds, scabs, or grows rapidly, please return to have it checked, as these  can be an indication of a skin cancer.  Rosacea  What is rosacea? Rosacea (say: ro-zay-sha) is a common skin disease that usually begins as a trend of flushing or blushing easily.  As rosacea progresses, a persistent redness in the center of the face will develop and may gradually spread beyond the nose and cheeks to the forehead and chin.  In some cases, the ears, chest, and back could be affected.  Rosacea may appear as tiny blood vessels or small red bumps that occur in crops.  Frequently they can contain pus, and are called "pustules".  If the bumps do not contain pus, they are referred to as "papules".  Rarely, in prolonged, untreated cases of rosacea, the oil glands of the nose and cheeks may become permanently enlarged.  This is called rhinophyma, and is seen more frequently in men.  Signs and Risks In its beginning stages, rosacea tends to come and go, which makes it difficult to recognize.  It can start as intermittent flushing of the face.  Eventually, blood vessels may become permanently visible.  Pustules and papules can appear, but can be mistaken for adult acne.  People of all races, ages, genders and ethnic groups are at risk of developing rosacea.  However, it is more common in women (especially around menopause) and adults with fair skin between the ages of 10 and 17.  Treatment Dermatologists typically recommend a combination of treatments to effectively manage rosacea.  Treatment can improve symptoms and may stop  the progression of the rosacea.  Treatment may involve both topical and oral medications.  The tetracycline antibiotics are often used for their anti-inflammatory effect; however, because of the possibility of developing antibiotic resistance, they should not be used long term at full dose.  For dilated blood vessels the options include electrodessication (uses electric current through a small needle), laser treatment, and cosmetics to hide the redness.   With all forms of  treatment, improvement is a slow process, and patients may not see any results for the first 3-4 weeks.  It is very important to avoid the sun and other triggers.  Patients must wear sunscreen daily.  Skin Care Instructions: Cleanse the skin with a mild soap such as CeraVe cleanser, Cetaphil cleanser, or Dove soap once or twice daily as needed. Moisturize with Eucerin Redness Relief Daily Perfecting Lotion (has a subtle green tint), CeraVe Moisturizing Cream, or Oil of Olay Daily Moisturizer with sunscreen every morning and/or night as recommended. Makeup should be "non-comedogenic" (won't clog pores) and be labeled "for sensitive skin". Good choices for cosmetics are: Neutrogena, Almay, and Physician's Formula.  Any product with a green tint tends to offset a red complexion. If your eyes are dry and irritated, use artificial tears 2-3 times per day and cleanse the eyelids daily with baby shampoo.  Have your eyes examined at least every 2 years.  Be sure to tell your eye doctor that you have rosacea. Alcoholic beverages tend to cause flushing of the skin, and may make rosacea worse. Always wear sunscreen, protect your skin from extreme hot and cold temperatures, and avoid spicy foods, hot drinks, and mechanical irritation such as rubbing, scrubbing, or massaging the face.  Avoid harsh skin cleansers, cleansing masks, astringents, and exfoliation. If a particular product burns or makes your face feel tight, then it is likely to flare your rosacea. If you are having difficulty finding a sunscreen that you can tolerate, you may try switching to a chemical-free sunscreen.  These are ones whose active ingredient is zinc oxide or titanium dioxide only.  They should also be fragrance free, non-comedogenic, and labeled for sensitive skin. Rosacea triggers may vary from person to person.  There are a variety of foods that have been reported to trigger rosacea.  Some patients find that keeping a diary of what they  were doing when they flared helps them avoid triggers.   Due to recent changes in healthcare laws, you may see results of your pathology and/or laboratory studies on MyChart before the doctors have had a chance to review them. We understand that in some cases there may be results that are confusing or concerning to you. Please understand that not all results are received at the same time and often the doctors may need to interpret multiple results in order to provide you with the best plan of care or course of treatment. Therefore, we ask that you please give us  2 business days to thoroughly review all your results before contacting the office for clarification. Should we see a critical lab result, you will be contacted sooner.   If You Need Anything After Your Visit  If you have any questions or concerns for your doctor, please call our main line at [phone] and press option 4 to reach your doctor's medical assistant. If no one answers, please leave a voicemail as directed and we will return your call as soon as possible. Messages left after 4 pm will be answered the following business day.  You may also send us  a message via MyChart. We typically respond to MyChart messages within 1-2 business days.  For prescription refills, please ask your pharmacy to contact our office. Our fax number is [phone].  If you have an urgent issue when the clinic is closed that cannot wait until the next business day, you can page your doctor at the number below.    Please note that while we do our best to be available for urgent issues outside of office hours, we are not available 24/7.   If you have an urgent issue and are unable to reach us , you may choose to seek medical care at your doctor's office, retail clinic, urgent care center, or emergency room.  If you have a medical emergency, please immediately call 911 or go to the emergency department.  Pager Numbers  - Dr. Hester:  [phone]  - Dr. Jackquline: [phone]  - Dr. Claudene: [phone]   - Dr. Raymund: [phone]  In the event of inclement weather, please call our main line at [phone] for an update on the status of any delays or closures.  Dermatology Medication Tips: Please keep the boxes that topical medications come in in order to help keep track of the instructions about where and how to use these. Pharmacies typically print the medication instructions only on the boxes and not directly on the medication tubes.   If your medication is too expensive, please contact our office at [phone] option 4 or send us  a message through MyChart.   We are unable to tell what your co-pay for medications will be in advance as this is different depending on your insurance coverage. However, we may be able to find a substitute medication at lower cost or fill out paperwork to get insurance to cover a needed medication.   If a prior authorization is required to get your medication covered by your insurance company, please allow us  1-2 business days to complete this process.  Drug prices often vary depending on where the prescription is filled and some pharmacies may offer cheaper prices.  The website www.goodrx.com contains coupons for medications through different pharmacies. The prices here do not account for what the cost may be with help from insurance (it may be cheaper with your insurance), but the website can give you the price if you did not use any insurance.  - You can print the associated coupon and take it with your prescription to the pharmacy.  - You may also stop by our office during regular business hours and pick up a GoodRx coupon card.  - If you need your prescription sent electronically to a different pharmacy, notify our office through [facility] or by phone at [phone] option 4.     Si Usted Necesita Algo Despus de Su Visita  Tambin puede enviarnos un mensaje a travs  de Clinical cytogeneticist. Por lo general respondemos a los mensajes de MyChart en el transcurso de 1 a 2 das hbiles.  Para renovar recetas, por favor pida a su farmacia que se ponga en contacto con nuestra oficina. Randi lakes de fax es [place] [phone].  Si tiene un asunto urgente cuando la clnica est cerrada y que no puede esperar hasta el siguiente da hbil, puede llamar/localizar a su doctor(a) al nmero que aparece a continuacin.   Por favor, tenga en cuenta que aunque hacemos todo lo posible para estar disponibles para asuntos urgentes fuera del horario de [place], no estamos disponibles las 24 horas del da, los  7 das de [employer].   Si tiene un problema urgente y no puede comunicarse con nosotros, puede optar por buscar atencin mdica  en el consultorio de su doctor(a), en una clnica privada, en un centro de atencin urgente o en una sala de emergencias.  Si tiene Engineer, drilling, por favor llame inmediatamente al 911 o vaya a la sala de emergencias.  Nmeros de bper  - Dr. Hester: [phone]  - Dra. Jackquline: [phone]  - Dr. Claudene: [phone]  - Dra. Kitts: [phone]  En caso de inclemencias del [place], por favor llame a nuestra lnea principal al [phone] para una actualizacin sobre el estado de cualquier retraso o cierre.  Consejos para la medicacin en dermatologa: Por favor, guarde las cajas en las que vienen los medicamentos de uso tpico para ayudarle a seguir las instrucciones sobre dnde y cmo usarlos. Las farmacias generalmente imprimen las instrucciones del medicamento slo en las cajas y no directamente en los tubos del [place].   Si su medicamento es muy caro, por favor, pngase en contacto con landry rieger llamando al [phone] y presione la opcin 4 o envenos un mensaje a travs de Clinical cytogeneticist.   No podemos decirle cul ser su copago por los medicamentos por adelantado ya que esto es diferente dependiendo de la cobertura de su seguro.  Sin embargo, es posible que podamos encontrar un medicamento sustituto a Audiological scientist un formulario para que el seguro cubra el medicamento que se considera necesario.   Si se requiere una autorizacin previa para que su compaa de seguros malta su medicamento, por favor permtanos de 1 a 2 das hbiles para completar este proceso.  Los precios de los medicamentos varan con frecuencia dependiendo del Environmental consultant de dnde se surte la receta y alguna farmacias pueden ofrecer precios ms baratos.  El sitio web www.goodrx.com tiene cupones para medicamentos de Health and safety inspector. Los precios aqu no tienen en cuenta lo que podra costar con la ayuda del seguro (puede ser ms barato con su seguro), pero el sitio web puede darle el precio si no utiliz Tourist information centre manager.  - Puede imprimir el cupn correspondiente y llevarlo con su receta a la farmacia.  - Tambin puede pasar por nuestra oficina durante el horario de atencin regular y Education officer, museum una tarjeta de cupones de GoodRx.  - Si necesita que su receta se enve electrnicamente a una farmacia diferente, informe a nuestra oficina a travs de MyChart de [place] o por telfono llamando al [phone] y presione la opcin 4.

## 2024-05-25 ENCOUNTER — Encounter: Payer: PPO | Admitting: Dermatology

## 2024-06-02 DIAGNOSIS — N3946 Mixed incontinence: Secondary | ICD-10-CM | POA: Diagnosis not present

## 2024-06-29 DIAGNOSIS — M7061 Trochanteric bursitis, right hip: Secondary | ICD-10-CM | POA: Diagnosis not present

## 2024-06-29 DIAGNOSIS — Z96641 Presence of right artificial hip joint: Secondary | ICD-10-CM | POA: Diagnosis not present

## 2024-08-20 ENCOUNTER — Other Ambulatory Visit: Payer: Self-pay | Admitting: Internal Medicine

## 2024-08-20 DIAGNOSIS — E1159 Type 2 diabetes mellitus with other circulatory complications: Secondary | ICD-10-CM

## 2024-08-20 DIAGNOSIS — R918 Other nonspecific abnormal finding of lung field: Secondary | ICD-10-CM

## 2024-08-27 ENCOUNTER — Ambulatory Visit
Admission: RE | Admit: 2024-08-27 | Discharge: 2024-08-27 | Disposition: A | Discharge status: Home / Self Care | Source: Ambulatory Visit | Attending: Internal Medicine | Admitting: Internal Medicine

## 2024-08-27 DIAGNOSIS — R918 Other nonspecific abnormal finding of lung field: Secondary | ICD-10-CM | POA: Insufficient documentation

## 2024-08-27 DIAGNOSIS — E1159 Type 2 diabetes mellitus with other circulatory complications: Secondary | ICD-10-CM | POA: Diagnosis present

## 2024-09-02 ENCOUNTER — Other Ambulatory Visit: Payer: Self-pay | Admitting: Internal Medicine

## 2024-09-02 DIAGNOSIS — N63 Unspecified lump in unspecified breast: Secondary | ICD-10-CM

## 2024-09-22 ENCOUNTER — Other Ambulatory Visit

## 2024-09-22 ENCOUNTER — Encounter

## 2024-10-07 ENCOUNTER — Encounter: Admitting: Dietician

## 2025-05-16 ENCOUNTER — Encounter: Admitting: Dermatology
